# Patient Record
Sex: Female | Born: 1960 | Race: White | Hispanic: No | Marital: Married | Smoking: Never smoker
Health system: Southern US, Community
[De-identification: ages and names within clinical notes are randomized; demographics above are authoritative.]

## PROBLEM LIST (undated history)

## (undated) DIAGNOSIS — F329 Major depressive disorder, single episode, unspecified: Secondary | ICD-10-CM

## (undated) DIAGNOSIS — K635 Polyp of colon: Secondary | ICD-10-CM

## (undated) DIAGNOSIS — T8859XA Other complications of anesthesia, initial encounter: Secondary | ICD-10-CM

## (undated) DIAGNOSIS — E669 Obesity, unspecified: Secondary | ICD-10-CM

## (undated) DIAGNOSIS — M25569 Pain in unspecified knee: Secondary | ICD-10-CM

## (undated) DIAGNOSIS — Z8489 Family history of other specified conditions: Secondary | ICD-10-CM

## (undated) DIAGNOSIS — F32A Depression, unspecified: Secondary | ICD-10-CM

## (undated) DIAGNOSIS — K219 Gastro-esophageal reflux disease without esophagitis: Secondary | ICD-10-CM

## (undated) DIAGNOSIS — G4710 Hypersomnia, unspecified: Secondary | ICD-10-CM

## (undated) DIAGNOSIS — K589 Irritable bowel syndrome without diarrhea: Secondary | ICD-10-CM

## (undated) DIAGNOSIS — M1990 Unspecified osteoarthritis, unspecified site: Secondary | ICD-10-CM

## (undated) DIAGNOSIS — R600 Localized edema: Secondary | ICD-10-CM

## (undated) DIAGNOSIS — R112 Nausea with vomiting, unspecified: Secondary | ICD-10-CM

## (undated) DIAGNOSIS — R51 Headache: Secondary | ICD-10-CM

## (undated) DIAGNOSIS — M1710 Unilateral primary osteoarthritis, unspecified knee: Secondary | ICD-10-CM

## (undated) DIAGNOSIS — Z9889 Other specified postprocedural states: Secondary | ICD-10-CM

## (undated) DIAGNOSIS — T4145XA Adverse effect of unspecified anesthetic, initial encounter: Secondary | ICD-10-CM

## (undated) DIAGNOSIS — M549 Dorsalgia, unspecified: Secondary | ICD-10-CM

## (undated) DIAGNOSIS — D509 Iron deficiency anemia, unspecified: Secondary | ICD-10-CM

## (undated) DIAGNOSIS — I10 Essential (primary) hypertension: Secondary | ICD-10-CM

## (undated) DIAGNOSIS — R7302 Impaired glucose tolerance (oral): Secondary | ICD-10-CM

## (undated) DIAGNOSIS — E559 Vitamin D deficiency, unspecified: Secondary | ICD-10-CM

## (undated) DIAGNOSIS — M79673 Pain in unspecified foot: Secondary | ICD-10-CM

## (undated) DIAGNOSIS — F419 Anxiety disorder, unspecified: Secondary | ICD-10-CM

## (undated) DIAGNOSIS — Z780 Asymptomatic menopausal state: Secondary | ICD-10-CM

## (undated) DIAGNOSIS — R062 Wheezing: Secondary | ICD-10-CM

## (undated) DIAGNOSIS — K760 Fatty (change of) liver, not elsewhere classified: Secondary | ICD-10-CM

## (undated) DIAGNOSIS — E119 Type 2 diabetes mellitus without complications: Secondary | ICD-10-CM

## (undated) DIAGNOSIS — F411 Generalized anxiety disorder: Secondary | ICD-10-CM

## (undated) DIAGNOSIS — D649 Anemia, unspecified: Secondary | ICD-10-CM

## (undated) HISTORY — DX: Major depressive disorder, single episode, unspecified: F32.9

## (undated) HISTORY — DX: Anemia, unspecified: D64.9

## (undated) HISTORY — DX: Dorsalgia, unspecified: M54.9

## (undated) HISTORY — DX: Irritable bowel syndrome, unspecified: K58.9

## (undated) HISTORY — PX: APPENDECTOMY: SHX54

## (undated) HISTORY — PX: KNEE ARTHROSCOPY: SUR90

## (undated) HISTORY — DX: Hypersomnia, unspecified: G47.10

## (undated) HISTORY — DX: Fatty (change of) liver, not elsewhere classified: K76.0

## (undated) HISTORY — DX: Impaired glucose tolerance (oral): R73.02

## (undated) HISTORY — DX: Pain in unspecified foot: M79.673

## (undated) HISTORY — DX: Anxiety disorder, unspecified: F41.9

## (undated) HISTORY — DX: Generalized anxiety disorder: F41.1

## (undated) HISTORY — DX: Vitamin D deficiency, unspecified: E55.9

## (undated) HISTORY — DX: Irritable bowel syndrome without diarrhea: K58.9

## (undated) HISTORY — DX: Type 2 diabetes mellitus without complications: E11.9

## (undated) HISTORY — PX: ABDOMINAL HYSTERECTOMY: SHX81

## (undated) HISTORY — DX: Wheezing: R06.2

## (undated) HISTORY — PX: OOPHORECTOMY: SHX86

## (undated) HISTORY — DX: Unspecified osteoarthritis, unspecified site: M19.90

## (undated) HISTORY — DX: Polyp of colon: K63.5

## (undated) HISTORY — DX: Pain in unspecified knee: M25.569

## (undated) HISTORY — PX: CHOLECYSTECTOMY: SHX55

## (undated) HISTORY — DX: Headache: R51

## (undated) HISTORY — DX: Localized edema: R60.0

## (undated) HISTORY — DX: Unilateral primary osteoarthritis, unspecified knee: M17.10

## (undated) HISTORY — DX: Depression, unspecified: F32.A

## (undated) HISTORY — DX: Gastro-esophageal reflux disease without esophagitis: K21.9

## (undated) HISTORY — PX: OTHER SURGICAL HISTORY: SHX169

## (undated) HISTORY — DX: Obesity, unspecified: E66.9

## (undated) HISTORY — DX: Asymptomatic menopausal state: Z78.0

## (undated) HISTORY — DX: Iron deficiency anemia, unspecified: D50.9

---

## 1997-06-27 ENCOUNTER — Encounter: Admission: RE | Admit: 1997-06-27 | Discharge: 1997-09-25 | Payer: Self-pay | Admitting: Obstetrics and Gynecology

## 1999-10-20 ENCOUNTER — Encounter: Payer: Self-pay | Admitting: *Deleted

## 1999-10-20 ENCOUNTER — Emergency Department (HOSPITAL_COMMUNITY): Admission: EM | Admit: 1999-10-20 | Discharge: 1999-10-20 | Payer: Self-pay | Admitting: *Deleted

## 2000-07-10 ENCOUNTER — Other Ambulatory Visit: Admission: RE | Admit: 2000-07-10 | Discharge: 2000-07-10 | Payer: Self-pay | Admitting: Obstetrics and Gynecology

## 2001-12-21 ENCOUNTER — Other Ambulatory Visit: Admission: RE | Admit: 2001-12-21 | Discharge: 2001-12-21 | Payer: Self-pay | Admitting: Obstetrics and Gynecology

## 2002-01-07 ENCOUNTER — Encounter: Payer: Self-pay | Admitting: Urology

## 2002-01-22 ENCOUNTER — Inpatient Hospital Stay (HOSPITAL_COMMUNITY): Admission: RE | Admit: 2002-01-22 | Discharge: 2002-01-24 | Payer: Self-pay | Admitting: Urology

## 2003-07-05 ENCOUNTER — Other Ambulatory Visit: Admission: RE | Admit: 2003-07-05 | Discharge: 2003-07-05 | Payer: Self-pay | Admitting: Obstetrics and Gynecology

## 2004-01-19 ENCOUNTER — Encounter
Admission: RE | Admit: 2004-01-19 | Discharge: 2004-02-21 | Payer: Self-pay | Admitting: Physical Medicine & Rehabilitation

## 2004-02-21 ENCOUNTER — Encounter
Admission: RE | Admit: 2004-02-21 | Discharge: 2004-04-10 | Payer: Self-pay | Admitting: Physical Medicine & Rehabilitation

## 2004-03-29 ENCOUNTER — Ambulatory Visit: Payer: Self-pay | Admitting: Physical Medicine & Rehabilitation

## 2004-04-10 ENCOUNTER — Encounter
Admission: RE | Admit: 2004-04-10 | Discharge: 2004-07-09 | Payer: Self-pay | Admitting: Physical Medicine & Rehabilitation

## 2004-07-19 ENCOUNTER — Ambulatory Visit: Payer: Self-pay | Admitting: Internal Medicine

## 2004-07-25 ENCOUNTER — Ambulatory Visit: Payer: Self-pay | Admitting: Internal Medicine

## 2004-10-19 ENCOUNTER — Other Ambulatory Visit: Admission: RE | Admit: 2004-10-19 | Discharge: 2004-10-19 | Payer: Self-pay | Admitting: Obstetrics and Gynecology

## 2004-11-14 ENCOUNTER — Encounter: Admission: RE | Admit: 2004-11-14 | Discharge: 2004-11-14 | Payer: Self-pay | Admitting: Sports Medicine

## 2004-11-20 ENCOUNTER — Emergency Department (HOSPITAL_COMMUNITY): Admission: EM | Admit: 2004-11-20 | Discharge: 2004-11-20 | Payer: Self-pay | Admitting: Family Medicine

## 2006-01-24 ENCOUNTER — Encounter: Admission: RE | Admit: 2006-01-24 | Discharge: 2006-01-24 | Payer: Self-pay | Admitting: Sports Medicine

## 2006-02-07 ENCOUNTER — Encounter: Admission: RE | Admit: 2006-02-07 | Discharge: 2006-02-07 | Payer: Self-pay | Admitting: Sports Medicine

## 2006-03-21 ENCOUNTER — Encounter: Admission: RE | Admit: 2006-03-21 | Discharge: 2006-03-21 | Payer: Self-pay | Admitting: Sports Medicine

## 2006-04-18 ENCOUNTER — Encounter: Admission: RE | Admit: 2006-04-18 | Discharge: 2006-04-18 | Payer: Self-pay | Admitting: Sports Medicine

## 2006-08-13 ENCOUNTER — Encounter: Admission: RE | Admit: 2006-08-13 | Discharge: 2006-08-13 | Payer: Self-pay | Admitting: Sports Medicine

## 2006-09-19 ENCOUNTER — Encounter: Admission: RE | Admit: 2006-09-19 | Discharge: 2006-09-19 | Payer: Self-pay | Admitting: Sports Medicine

## 2006-10-10 ENCOUNTER — Encounter: Admission: RE | Admit: 2006-10-10 | Discharge: 2006-10-10 | Payer: Self-pay | Admitting: Sports Medicine

## 2006-11-28 ENCOUNTER — Encounter: Admission: RE | Admit: 2006-11-28 | Discharge: 2006-11-28 | Payer: Self-pay | Admitting: Sports Medicine

## 2006-12-07 ENCOUNTER — Emergency Department (HOSPITAL_COMMUNITY): Admission: EM | Admit: 2006-12-07 | Discharge: 2006-12-07 | Payer: Self-pay | Admitting: Family Medicine

## 2006-12-29 ENCOUNTER — Ambulatory Visit: Payer: Self-pay | Admitting: Internal Medicine

## 2006-12-29 LAB — CONVERTED CEMR LAB
ALT: 39 units/L — ABNORMAL HIGH (ref 0–35)
AST: 25 units/L (ref 0–37)
Alkaline Phosphatase: 49 units/L (ref 39–117)
BUN: 13 mg/dL (ref 6–23)
Basophils Relative: 1.1 % — ABNORMAL HIGH (ref 0.0–1.0)
CO2: 28 meq/L (ref 19–32)
Calcium: 9 mg/dL (ref 8.4–10.5)
Chloride: 116 meq/L — ABNORMAL HIGH (ref 96–112)
Crystals: NEGATIVE
Eosinophils Relative: 4.5 % (ref 0.0–5.0)
GFR calc Af Amer: 99 mL/min
HCT: 31.7 % — ABNORMAL LOW (ref 36.0–46.0)
Hemoglobin, Urine: NEGATIVE
Ketones, ur: NEGATIVE mg/dL
Mucus, UA: NEGATIVE
Neutrophils Relative %: 41.8 % — ABNORMAL LOW (ref 43.0–77.0)
Platelets: 164 10*3/uL (ref 150–400)
RBC / HPF: NONE SEEN
RBC: 3.6 M/uL — ABNORMAL LOW (ref 3.87–5.11)
Total CHOL/HDL Ratio: 4.9
Total Protein: 6.7 g/dL (ref 6.0–8.3)
Triglycerides: 114 mg/dL (ref 0–149)
VLDL: 23 mg/dL (ref 0–40)
WBC: 4.7 10*3/uL (ref 4.5–10.5)

## 2007-01-02 ENCOUNTER — Ambulatory Visit: Payer: Self-pay | Admitting: Internal Medicine

## 2007-01-03 DIAGNOSIS — F329 Major depressive disorder, single episode, unspecified: Secondary | ICD-10-CM

## 2007-01-03 DIAGNOSIS — R51 Headache: Secondary | ICD-10-CM

## 2007-01-03 DIAGNOSIS — D509 Iron deficiency anemia, unspecified: Secondary | ICD-10-CM

## 2007-01-03 DIAGNOSIS — F411 Generalized anxiety disorder: Secondary | ICD-10-CM | POA: Insufficient documentation

## 2007-01-03 DIAGNOSIS — F3289 Other specified depressive episodes: Secondary | ICD-10-CM

## 2007-01-03 DIAGNOSIS — R519 Headache, unspecified: Secondary | ICD-10-CM | POA: Insufficient documentation

## 2007-01-03 HISTORY — DX: Other specified depressive episodes: F32.89

## 2007-01-03 HISTORY — DX: Generalized anxiety disorder: F41.1

## 2007-01-03 HISTORY — DX: Iron deficiency anemia, unspecified: D50.9

## 2007-01-03 HISTORY — DX: Headache: R51

## 2007-01-03 HISTORY — DX: Major depressive disorder, single episode, unspecified: F32.9

## 2007-01-07 ENCOUNTER — Ambulatory Visit: Payer: Self-pay | Admitting: Internal Medicine

## 2007-01-07 LAB — CONVERTED CEMR LAB
Basophils Absolute: 0 10*3/uL (ref 0.0–0.1)
Eosinophils Absolute: 0.1 10*3/uL (ref 0.0–0.6)
HCT: 37.1 % (ref 36.0–46.0)
Hemoglobin: 12.7 g/dL (ref 12.0–15.0)
MCHC: 34.1 g/dL (ref 30.0–36.0)
MCV: 88.6 fL (ref 78.0–100.0)
Monocytes Absolute: 0.6 10*3/uL (ref 0.2–0.7)
Monocytes Relative: 6.8 % (ref 3.0–11.0)
Neutrophils Relative %: 57.4 % (ref 43.0–77.0)
RBC: 4.19 M/uL (ref 3.87–5.11)

## 2007-08-13 ENCOUNTER — Ambulatory Visit (HOSPITAL_BASED_OUTPATIENT_CLINIC_OR_DEPARTMENT_OTHER): Admission: RE | Admit: 2007-08-13 | Discharge: 2007-08-13 | Payer: Self-pay | Admitting: Orthopedic Surgery

## 2007-10-31 ENCOUNTER — Emergency Department (HOSPITAL_COMMUNITY): Admission: EM | Admit: 2007-10-31 | Discharge: 2007-10-31 | Payer: Self-pay | Admitting: Emergency Medicine

## 2008-12-26 LAB — HM MAMMOGRAPHY: HM Mammogram: NORMAL

## 2009-01-16 ENCOUNTER — Ambulatory Visit: Payer: Self-pay | Admitting: Internal Medicine

## 2009-01-16 DIAGNOSIS — M1990 Unspecified osteoarthritis, unspecified site: Secondary | ICD-10-CM | POA: Insufficient documentation

## 2009-01-16 DIAGNOSIS — K589 Irritable bowel syndrome without diarrhea: Secondary | ICD-10-CM

## 2009-01-16 DIAGNOSIS — K219 Gastro-esophageal reflux disease without esophagitis: Secondary | ICD-10-CM

## 2009-01-16 DIAGNOSIS — M1710 Unilateral primary osteoarthritis, unspecified knee: Secondary | ICD-10-CM

## 2009-01-16 DIAGNOSIS — E739 Lactose intolerance, unspecified: Secondary | ICD-10-CM | POA: Insufficient documentation

## 2009-01-16 DIAGNOSIS — IMO0002 Reserved for concepts with insufficient information to code with codable children: Secondary | ICD-10-CM

## 2009-01-16 HISTORY — DX: Irritable bowel syndrome, unspecified: K58.9

## 2009-01-16 HISTORY — DX: Gastro-esophageal reflux disease without esophagitis: K21.9

## 2009-01-16 HISTORY — DX: Reserved for concepts with insufficient information to code with codable children: IMO0002

## 2009-02-13 ENCOUNTER — Telehealth: Payer: Self-pay | Admitting: Internal Medicine

## 2009-02-15 ENCOUNTER — Ambulatory Visit: Payer: Self-pay | Admitting: Internal Medicine

## 2009-02-15 DIAGNOSIS — R1013 Epigastric pain: Secondary | ICD-10-CM | POA: Insufficient documentation

## 2009-02-17 LAB — CONVERTED CEMR LAB
AST: 17 units/L (ref 0–37)
Albumin: 4.2 g/dL (ref 3.5–5.2)
Basophils Absolute: 0.1 10*3/uL (ref 0.0–0.1)
CO2: 31 meq/L (ref 19–32)
Cholesterol: 179 mg/dL (ref 0–200)
Eosinophils Absolute: 0.3 10*3/uL (ref 0.0–0.7)
Glucose, Bld: 96 mg/dL (ref 70–99)
HCT: 41.3 % (ref 36.0–46.0)
Hemoglobin, Urine: NEGATIVE
Hemoglobin: 14.2 g/dL (ref 12.0–15.0)
Hgb A1c MFr Bld: 5.3 % (ref 4.6–6.5)
LDL Cholesterol: 97 mg/dL (ref 0–99)
Lymphs Abs: 3 10*3/uL (ref 0.7–4.0)
MCHC: 34.3 g/dL (ref 30.0–36.0)
MCV: 87.5 fL (ref 78.0–100.0)
Neutro Abs: 4 10*3/uL (ref 1.4–7.7)
Potassium: 4.1 meq/L (ref 3.5–5.1)
RDW: 11.9 % (ref 11.5–14.6)
Sodium: 141 meq/L (ref 135–145)
TSH: 0.74 microintl units/mL (ref 0.35–5.50)
Total CHOL/HDL Ratio: 4
Urine Glucose: NEGATIVE mg/dL
Urobilinogen, UA: 0.2 (ref 0.0–1.0)

## 2009-07-04 ENCOUNTER — Encounter: Payer: Self-pay | Admitting: Internal Medicine

## 2009-11-24 ENCOUNTER — Encounter (INDEPENDENT_AMBULATORY_CARE_PROVIDER_SITE_OTHER): Payer: Self-pay | Admitting: *Deleted

## 2009-12-29 ENCOUNTER — Ambulatory Visit: Payer: Self-pay | Admitting: Gastroenterology

## 2009-12-29 ENCOUNTER — Encounter (INDEPENDENT_AMBULATORY_CARE_PROVIDER_SITE_OTHER): Payer: Self-pay | Admitting: *Deleted

## 2010-01-01 ENCOUNTER — Telehealth: Payer: Self-pay | Admitting: Gastroenterology

## 2010-02-22 ENCOUNTER — Telehealth: Payer: Self-pay | Admitting: Gastroenterology

## 2010-04-02 ENCOUNTER — Ambulatory Visit: Payer: Self-pay | Admitting: Internal Medicine

## 2010-04-02 LAB — CONVERTED CEMR LAB
ALT: 14 units/L (ref 0–35)
AST: 19 units/L (ref 0–37)
Albumin: 3.6 g/dL (ref 3.5–5.2)
Alkaline Phosphatase: 43 units/L (ref 39–117)
BUN: 11 mg/dL (ref 6–23)
Basophils Relative: 1 % (ref 0.0–3.0)
CO2: 28 meq/L (ref 19–32)
Chloride: 106 meq/L (ref 96–112)
Eosinophils Relative: 3.7 % (ref 0.0–5.0)
Glucose, Bld: 103 mg/dL — ABNORMAL HIGH (ref 70–99)
Hemoglobin, Urine: NEGATIVE
Leukocytes, UA: NEGATIVE
Lymphocytes Relative: 42.8 % (ref 12.0–46.0)
MCV: 88.6 fL (ref 78.0–100.0)
Magnesium: 2 mg/dL (ref 1.5–2.5)
Monocytes Relative: 6.2 % (ref 3.0–12.0)
Neutrophils Relative %: 46.3 % (ref 43.0–77.0)
Nitrite: NEGATIVE
Potassium: 4.8 meq/L (ref 3.5–5.1)
RBC: 4.25 M/uL (ref 3.87–5.11)
TSH: 1.07 microintl units/mL (ref 0.35–5.50)
Total CHOL/HDL Ratio: 4
Total Protein, Urine: NEGATIVE mg/dL
WBC: 5.9 10*3/uL (ref 4.5–10.5)
pH: 6 (ref 5.0–8.0)

## 2010-04-04 ENCOUNTER — Encounter: Payer: Self-pay | Admitting: Internal Medicine

## 2010-04-04 ENCOUNTER — Ambulatory Visit: Payer: Self-pay | Admitting: Internal Medicine

## 2010-04-04 DIAGNOSIS — G4710 Hypersomnia, unspecified: Secondary | ICD-10-CM | POA: Insufficient documentation

## 2010-04-04 HISTORY — DX: Hypersomnia, unspecified: G47.10

## 2010-04-20 ENCOUNTER — Encounter (INDEPENDENT_AMBULATORY_CARE_PROVIDER_SITE_OTHER): Payer: Self-pay | Admitting: *Deleted

## 2010-05-03 ENCOUNTER — Ambulatory Visit: Payer: Self-pay | Admitting: Gastroenterology

## 2010-05-03 ENCOUNTER — Ambulatory Visit (HOSPITAL_COMMUNITY): Admission: RE | Admit: 2010-05-03 | Discharge: 2010-05-03 | Payer: Self-pay | Admitting: Gastroenterology

## 2010-05-03 LAB — HM COLONOSCOPY

## 2010-05-07 ENCOUNTER — Encounter: Payer: Self-pay | Admitting: Gastroenterology

## 2010-05-30 ENCOUNTER — Ambulatory Visit: Payer: Self-pay | Admitting: Psychology

## 2010-06-13 ENCOUNTER — Ambulatory Visit: Payer: Self-pay | Admitting: Psychology

## 2010-06-26 ENCOUNTER — Ambulatory Visit
Admission: RE | Admit: 2010-06-26 | Discharge: 2010-06-26 | Payer: Self-pay | Source: Home / Self Care | Attending: Psychology | Admitting: Psychology

## 2010-07-02 ENCOUNTER — Ambulatory Visit
Admission: RE | Admit: 2010-07-02 | Discharge: 2010-07-02 | Payer: Self-pay | Source: Home / Self Care | Attending: Internal Medicine | Admitting: Internal Medicine

## 2010-07-02 DIAGNOSIS — R062 Wheezing: Secondary | ICD-10-CM

## 2010-07-02 HISTORY — DX: Wheezing: R06.2

## 2010-07-03 ENCOUNTER — Ambulatory Visit: Admit: 2010-07-03 | Payer: Self-pay | Admitting: Psychology

## 2010-07-04 ENCOUNTER — Telehealth: Payer: Self-pay | Admitting: Internal Medicine

## 2010-07-06 ENCOUNTER — Ambulatory Visit
Admission: RE | Admit: 2010-07-06 | Discharge: 2010-07-06 | Payer: Self-pay | Source: Home / Self Care | Attending: Gastroenterology | Admitting: Gastroenterology

## 2010-07-10 ENCOUNTER — Encounter
Admission: RE | Admit: 2010-07-10 | Discharge: 2010-07-10 | Payer: Self-pay | Source: Home / Self Care | Attending: Interventional Radiology | Admitting: Interventional Radiology

## 2010-07-26 NOTE — Procedures (Signed)
Summary: Instructions for procedure/[place]  Instructions for procedure/[place]   Imported By: Sherian Rein 01/18/2010 08:50:14  _____________________________________________________________________  External Attachment:    Type:   Image     Comment:   External Document

## 2010-07-26 NOTE — Progress Notes (Signed)
Summary: Reschedule Procedure  Phone Note Call from Patient Call back at [phone]   Caller: Patient Call For: Dr. Christella Hartigan Reason for Call: Talk to Nurse Summary of Call: pt. needs to r/s her Endo/Colon...her son is having surgery and would like it r/s in Sept. Initial call taken by: Karna Christmas,  January 01, 2010 8:13 AM  Follow-up for Phone Call        rescheduled for 03/01/10  pt aware and instructions were reviewed for the new date Follow-up by: Chales Abrahams CMA Duncan Dull),  January 01, 2010 8:42 AM

## 2010-07-26 NOTE — Assessment & Plan Note (Signed)
History of Present Illness Visit Type: Initial Consult Primary GI MD: Rob Bunting MD Primary Provider: Oliver Barre, MD Chief Complaint: 1 year nausea and epigastric abdominal discomfort. History of Present Illness:     Stacey 50 year old [name] who is here with her daughter today. who is nauseas most morning, can vomit at times.  This has been a problem for about a year.  She takes Scientist, research (medical). She gets pyrosis, acid taste in throat for many years.  She has been on omeprazole, she takes one pill in AM after breakfast and one pill after dinner.  This will help the pyrosis.  When she stops then the nausea returns.  She has choking sensation at time.  Sometimes large bolus of food will cause dysphagia.    Overall her weight is up/down overall stable in the past year.  Very rare NSAIDs.  She's had IBS like alternating constipating/loose stools.  She had an EGD/colonoscopy by Bu[facility]ini many years ago.           Current Medications (verified): 1)  Estradiol 2 Mg Tabs (Estradiol) .... Take 1 Tablet By Mouth Once A Day 2)  Omeprazole 20 Mg Cpdr (Omeprazole) .... 2 By Mouth Once Daily 3)  Promethazine Hcl 25 Mg Tabs (Promethazine Hcl) .Marland Kitchen.. 1 By Mouth  Q 6 Hrs As Needed  Allergies: 1)  ! Tylox 2)  ! Morphine 3)  ! Codeine 4)  ! Penicillin 5)  ! Amoxicillin  Past History:  Past Medical History: Anemia-iron deficiency Anxiety Depression Obesity Headache- Migraine right knee severe DJD IBS (had flex sig in her 20's) Early menopause due to endometriosis GERD ? OAB - declines med trial    Past Surgical History: Appendectomy- 1992 Cholecystectomy- 1991 GU surgery- 12/2001/bladder tack Hysterectomy- 1992 s/p bilat knee arthroscopic surgury Oophorectomy - bilat at 50 yo    Family History: son with epilepsy father died with spinal meningitis at 10yo mother with HTN   Social History: Married 2 children Homemaker Never Smoked Alcohol use-no   Review of Systems   Pertinent positive and negative review of systems were noted in the above HPI and GI specific review of systems.  All other review of systems was otherwise negative.   Vital Signs:  Patient profile:   50 year old female Height:      66 inches Weight:      238.13 pounds BMI:     38.57 BSA:     2.16 Pulse rate:   80 / minute Pulse rhythm:   regular BP sitting:   138 / 88  (left arm)  Vitals Entered By: Lamona Curl CMA Duncan Dull) (December 29, 2009 3:24 PM)  Physical Exam  Additional Exam:  Constitutional: Obese, otherwise generally well appearing Psychiatric: alert and oriented times 3 Eyes: extraocular movements intact Mouth: oropharynx moist, no lesions Neck: supple, no lymphadenopathy Cardiovascular: heart regular rate and rythm Lungs: CTA bilaterally Abdomen: soft, non-tender, non-distended, no obvious ascites, no peritoneal signs, normal bowel sounds Extremities: no lower extremity edema bilaterally Skin: no lesions on visible extremities    Impression & Recommendations:  Problem # 1:  chronic GERD, routine risk for colon cancer she has had GERD-like symptoms for many many years. Proton pump inhibitors help when she takes them. She does not take them at the correct time in relation to food and so I have recommended that she change that. She did not have any alarm symptoms but we should proceed with EGD given the chronicity of her GERD symptoms. We'll check her  for Barrett's, other complications. At the same time will proceed with a screening colonoscopy. She is very nervous about all of this and I think it is best that we do this at the hospital with propofol sedation. She is also telling me that she knows that she'll be nauseous and will be unable to tolerate her colonoscopy prep and so I have given her a prescription for Zofran, 2 pills.  Patient Instructions: 1)  You should change the way you are taking your antiacid medicine (omeprazole) so that you are taking it 20-30  minutes prior to a decent meal as that is the way the pill is designed to work most effectively. 2)  You will be scheduled to have an upper endoscopy [facility] with propofol sedation). 3)  You will be scheduled to have a colonoscopy. 4)  Zofran 4mg  pills (two) given as prescription, take one pill before you start the colonosocpy prep and one 4-6 hours later if needed. 5)  A copy of this information will be sent to Dr. Jonny Ruiz. 6)  The medication list was reviewed and reconciled.  All changed / newly prescribed medications were explained.  A complete medication list was provided to the patient / caregiver. Prescriptions: ZOFRAN 4 MG  TABS (ONDANSETRON HCL) take one pill as needed for nausea every 5-6 hours  #2 x 0   Entered and Authorized by:   Rachael Fee MD   Signed by:   Rachael Fee MD on 12/29/2009   Method used:   [facility] [address] [phone]* (retail)       [address]       [place], [place]  [address]       Ph: [phone] or [phone]       Fax: [phone]   RxID:   [id number]   Appended Document: Orders Update/Movi    Clinical Lists Changes  Problems: Added new problem of SPECIAL SCREENING FOR MALIGNANT NEOPLASMS COLON (ICD-V76.51) Medications: Added new medication of MOVIPREP 100 GM  SOLR (PEG-KCL-NACL-NASULF-NA ASC-C) As per prep instructions. - Signed Rx of MOVIPREP 100 GM  SOLR (PEG-KCL-NACL-NASULF-NA ASC-C) As per prep instructions.;  #1 x 0;  Signed;  Entered by: Chales Abrahams CMA (AAMA);  Authorized by: Rachael Fee MD;  Method used: [facility] [address] [phone]*, [address] [place], [place], [place]  [address], Ph: [phone] or [phone], Fax: [phone] Orders: Added new Test order of [facility] (Col/End [place]) - Signed    Prescriptions: MOVIPREP 100 GM  SOLR (PEG-KCL-NACL-NASULF-NA ASC-C) As per prep instructions.  #1 x 0   Entered by:   Chales Abrahams CMA (AAMA)   Authorized by:   Rachael Fee MD   Signed by:   Chales Abrahams CMA (AAMA) on 12/29/2009   Method used:   [facility]        [facility] [address] [phone]* (retail)       [address]       [place], [place]  [address]       Ph: [phone] or [phone]       Fax: [phone]   RxID:   [id number]

## 2010-07-26 NOTE — Procedures (Signed)
Summary: EGD/[place]  EGD/[place]   Imported By: Sherian Rein 07/11/2010 08:41:43  _____________________________________________________________________  External Attachment:    Type:   Image     Comment:   External Document

## 2010-07-26 NOTE — Letter (Signed)
Summary: [facility] Instructions  [facility]  [address] [place], [place] [address]   Phone: [phone]  Fax: [phone]       DINORA HEMM    1960/12/08    MRN: [id number]        Procedure Day /Date:05/03/10  THURS     Arrival Time:9 am       Procedure Time:11 am     Location of Procedure:                     X  [facility] ( Outpatient Registration)                        PREPARATION FOR COLONOSCOPY WITH MOVIPREP   Starting 5 days prior to your procedure 04/28/10 do not eat nuts, seeds, popcorn, corn, beans, peas,  salads, or any raw vegetables.  Do not take any fiber supplements (e.g. Metamucil, Citrucel, and Benefiber).  THE DAY BEFORE YOUR PROCEDURE         DATE: 05/02/10  DAY: WED  1.  Drink clear liquids the entire day-NO SOLID FOOD  2.  Do not drink anything colored red or purple.  Avoid juices with pulp.  No orange juice.  3.  Drink at least 64 oz. (8 glasses) of fluid/clear liquids during the day to prevent dehydration and help the prep work efficiently.  CLEAR LIQUIDS INCLUDE: Water Jello Ice Popsicles Tea (sugar ok, no milk/cream) Powdered fruit flavored drinks Coffee (sugar ok, no milk/cream) Gatorade Juice: apple, white grape, white cranberry  Lemonade Clear bullion, consomm, broth Carbonated beverages (any kind) Strained chicken noodle soup Hard Candy                             4.  In the morning, mix first dose of MoviPrep solution:    Empty 1 Pouch A and 1 Pouch B into the disposable container    Add lukewarm drinking water to the top line of the container. Mix to dissolve    Refrigerate (mixed solution should be used within 24 hrs)  5.  Begin drinking the prep at 5:00 p.m. The MoviPrep container is divided by 4 marks.   Every 15 minutes drink the solution down to the next mark (approximately 8 oz) until the full liter is complete.   6.  Follow completed prep with 16 oz of clear liquid of your choice (Nothing  red or purple).  Continue to drink clear liquids until bedtime.  7.  Before going to bed, mix second dose of MoviPrep solution:    Empty 1 Pouch A and 1 Pouch B into the disposable container    Add lukewarm drinking water to the top line of the container. Mix to dissolve    Refrigerate  THE DAY OF YOUR PROCEDURE      DATE: 05/03/10 DAY: THURS  Beginning at 6 a.m. (5 hours before procedure):         1. Every 15 minutes, drink the solution down to the next mark (approx 8 oz) until the full liter is complete.  2. Follow completed prep with 16 oz. of clear liquid of your choice.     Nothing to eat or drink after midnight  MEDICATION INSTRUCTIONS  Unless otherwise instructed, you should take regular prescription medications with a small sip of water   as early as possible the morning of your procedure.  OTHER INSTRUCTIONS  You will need a responsible adult at least 51 years of age to accompany you and drive you home.   This person must remain in the waiting room during your procedure.  Wear loose fitting clothing that is easily removed.  Leave jewelry and other valuables at home.  However, you may wish to bring a book to read or  an iPod/MP3 player to listen to music as you wait for your procedure to start.  Remove all body piercing jewelry and leave at home.  Total time from sign-in until discharge is approximately 2-3 hours.  You should go home directly after your procedure and rest.  You can resume normal activities the  day after your procedure.  The day of your procedure you should not:   Drive   Make legal decisions   Operate machinery   Drink alcohol   Return to work  You will receive specific instructions about eating, activities and medications before you leave.    The above instructions have been reviewed and explained to me by   _______________________    I fully understand and can verbalize these instructions  _____________________________ Date _________

## 2010-07-26 NOTE — Letter (Signed)
Summary: Vomiting & diarrhea/Call-C-Nurse  Vomiting & diarrhea/[facility]   Imported By: Lester Laie 07/06/2009 12:19:27  _____________________________________________________________________  External Attachment:    Type:   Image     Comment:   External Document

## 2010-07-26 NOTE — Assessment & Plan Note (Signed)
Review of gastrointestinal problems: 1. Hyperplastic colon polyp, November 2011.  Next colonoscopy at 10 year interval    History of Present Illness Visit Type: Follow-up Visit Primary GI MD: Rob Bunting MD Primary Provider: Oliver Barre, MD Chief Complaint: abdominal pain History of Present Illness:     very pleasant 50 year old woman whom I last saw about 2 months ago at the time of upper and lower endoscopies.  she started to have welts on neck, arms, legs..this was in end of november. only meds she was on was estrogen and omeprazole at that time.  stopped the omeprazole and welts went away.  She occasionally has nausea, intermittently.  About 2 times a week. Can last 1-2 hours.  Will lay down.  (had cholecystecomy 20 years ago).  When she on PPI, the symptoms didn't happen nearly at all.    She eats a lot oranges.             Current Medications (verified): 1)  Azithromycin 250 Mg Tabs (Azithromycin) .... 2po Qd For 1 Day, Then 1po Qd For 4days, Then Stop 2)  Estradiol 2 Mg Tabs (Estradiol) .Marland Kitchen.. 1 By Mouth Once Daily 3)  Prednisone 10 Mg Tabs (Prednisone) .... 3po Qd For 3days, Then 2po Qd For 3days, Then 1po Qd For 3days, Then Stop 4)  Tessalon Perles 100 Mg Caps (Benzonatate) .Marland Kitchen.. 1po Three Times A Day As Needed  Allergies: 1)  ! Tylox 2)  ! Morphine 3)  ! Codeine 4)  ! Penicillin 5)  ! Amoxicillin 6)  ! Hydrocodone 7)  ! * Omeprazole  Vital Signs:  Patient profile:   50 year old female Height:      66 inches Weight:      235.38 pounds BMI:     38.13 Pulse rate:   68 / minute Pulse rhythm:   regular BP sitting:   120 / 72  (left arm) Cuff size:   large  Vitals Entered By: June McMurray CMA Duncan Dull) (July 06, 2010 9:13 AM)  Physical Exam  Additional Exam:  Constitutional: generally well appearing Psychiatric: alert and oriented times 3 Abdomen: soft, non-tender, non-distended, normal bowel sounds    Impression & Recommendations:  Problem # 1:   dyspepsia, GERD related likely she has head a potential reaction to omeprazole. She will try H2 blocker nightly before she goes to bed and will call my office to report on her symptoms in 4-5 weeks.  Patient Instructions: 1)  Try H2 blocker (zantac/pepcid) at bedtime every night for 1 months, then call Dr. Christella Hartigan' office to report on your symptoms. 2)  Prescription for zofran given, take as needed for nausea. 3)  A copy of this information will be sent to Dr. Jonny Ruiz. 4)  The medication list was reviewed and reconciled.  All changed / newly prescribed medications were explained.  A complete medication list was provided to the patient / caregiver. Prescriptions: ZOFRAN 4 MG  TABS (ONDANSETRON HCL) take one pill every 12 hours as needed for nausea  #50 x 1   Entered and Authorized by:   Rachael Fee MD   Signed by:   Rachael Fee MD on 07/06/2010   Method used:   [facility]        [facility] [address] [phone]* (retail)       [address]       [place], [place]  [address]       Ph: [phone]  or [phone]       Fax: [phone]   RxID:   [id number]

## 2010-07-26 NOTE — Procedures (Signed)
Summary: Colonoscopy  Patient: [name] Note: All result statuses are Final unless otherwise noted.  Tests: (1) Colonoscopy (COL)   COL Colonoscopy           DONE     [facility]     [address] [place], [place]  [address]           COLONOSCOPY PROCEDURE REPORT           PATIENT:  [name], [name]  MR#:  [id number]     BIRTHDATE:  [birth year], 49 yrs. old  GENDER:  female     ENDOSCOPIST:  Rachael Fee, MD     REF. BY:  Oliver Barre, M.D.     PROCEDURE DATE:  05/03/2010     PROCEDURE:  Colonoscopy with snare polypectomy     ASA CLASS:  Class II     INDICATIONS:  Routine Risk Screening     MEDICATIONS:   MAC sedation, administered by CRNA           DESCRIPTION OF PROCEDURE:   After the risks benefits and     alternatives of the procedure were thoroughly explained, informed     consent was obtained.  Digital rectal exam was performed and     revealed no rectal masses.   The [id number] ([id number]) endoscope was     introduced through the anus and advanced to the cecum, which was     identified by both the appendix and ileocecal valve, without     limitations.  The quality of the prep was good, using MoviPrep.     The instrument was then slowly withdrawn as the colon was fully     examined.     <<PROCEDUREIMAGES>>     FINDINGS:  A diminutive polyp was found in the rectum (see     image003).  This was otherwise a normal examination of the colon     (see image001, image002, and image004).   Retroflexed views in the     rectum revealed no abnormalities.    The scope was then withdrawn     from the patient and the procedure completed.     COMPLICATIONS:  None           ENDOSCOPIC IMPRESSION:     1) Diminutive polyp in the rectum, removed and sent to pathology           2) Otherwise normal examination           RECOMMENDATIONS:     1) If the polyp(s) removed today are proven to be adenomatous     (pre-cancerous) polyps, you will need a repeat colonoscopy in  5     years. Otherwise you should continue to follow colorectal cancer     screening guidelines for "routine risk" patients with colonoscopy     in 10 years.     2) You will receive a letter within 1-2 weeks with the results     of your biopsy as well as final recommendations. Please call my     office if you have not received a letter after 3 weeks.           ______________________________     Rachael Fee, MD           n.     eSIGNED:   Rachael Fee at 05/03/2010 11:12 AM           Alycia Patten, [id number]  Note: An exclamation  mark (!) indicates a result that was not dispersed into the flowsheet. Document Creation Date: 05/03/2010 11:14 AM _______________________________________________________________________  (1) Order result status: Final Collection or observation date-time: 05/03/2010 11:02 Requested date-time:  Receipt date-time:  Reported date-time:  Referring Physician:   Ordering Physician: Rob Bunting [phone]) Specimen Source:  Source: Launa Grill Order Number: [id number] Lab site:

## 2010-07-26 NOTE — Letter (Signed)
Summary: Results Letter  [facility]  [address] [place], [place] [address]   Phone: [phone]  Fax: [phone]        May 07, 2010 MRN: [id number]    [name] [address] [place], [place]  [address]    Dear Ms. Bouska,   Good news.  The polyp(s) that were removed during your recent procedure were NOT pre-cancerous.  You should continue to follow current colorectal cancer screening guidelines with a repeat colonoscopy in 10 years.  We will therefore put your information in our reminder system and will contact you in 10 years to schedule a repeat procedure.  Please call if you have any questions or concerns.       Sincerely,  Rachael Fee MD  This letter has been electronically signed by your physician.  Appended Document: Results Letter letter mailed

## 2010-07-26 NOTE — Assessment & Plan Note (Signed)
Summary: cough/chest pressure/congestion/headaches/lb   Vital Signs:  Patient profile:   50 year old female Height:      66 inches Weight:      236.13 pounds BMI:     38.25 O2 Sat:      95 % on Room air Temp:     97.4 degrees F oral Pulse rate:   89 / minute BP sitting:   130 / 72  (left arm) Cuff size:   small  Vitals Entered By: Zella Ball Ewing CMA Duncan Dull) (July 02, 2010 2:31 PM)  O2 Flow:  Room air CC: Cough, Chest tightness, headaches/RE   Primary Care Provider:  Oliver Barre, MD  CC:  Cough, Chest tightness, and headaches/RE.  History of Present Illness: here for acute and f/u - overall doing ok, until 3 days with fever, mild ST, and now worsening prod cough greenish sputum and mild wheezing/sob; Pt denies CP, worsening sob, doe, wheezing, orthopnea, pnd, worsening LE edema, palps, dizziness or syncope  Pt denies new neuro symptoms such as headache, facial or extremity weakness  Pt denies polydipsia, polyuria  Overall good compliance with meds, trying to follow low chol diet, wt stable, little excercise however Denies worsening depressive symptoms, suicidal ideation, or panic.  , though still has ongoing anxiety, though not worse recently adn manageable on no current meds.  No recent wt loss, night sweats, loss of appetite or other constitutional symptoms   Problems Prior to Update: 1)  Wheezing  (ICD-786.07) 2)  Bronchitis-acute  (ICD-466.0) 3)  Hypersomnia  (ICD-780.54) 4)  Special Screening For Malignant Neoplasms Colon  (ICD-V76.51) 5)  Abdominal Pain, Epigastric  (ICD-789.06) 6)  Glucose Intolerance  (ICD-271.3) 7)  Gerd  (ICD-530.81) 8)  Preventive Health Care  (ICD-V70.0) 9)  Ibs  (ICD-564.1) 10)  Osteoarthritis, Knees, Bilateral  (ICD-715.96) 11)  Headache  (ICD-784.0) 12)  Depression  (ICD-311) 13)  Anxiety  (ICD-300.00) 14)  Anemia-iron Deficiency  (ICD-280.9)  Medications Prior to Update: 1)  Omeprazole 20 Mg Cpdr (Omeprazole) .... 2 By Mouth Once Daily 2)   Estradiol 2 Mg Tabs (Estradiol) .Marland Kitchen.. 1 By Mouth Once Daily  Current Medications (verified): 1)  Azithromycin 250 Mg Tabs (Azithromycin) .... 2po Qd For 1 Day, Then 1po Qd For 4days, Then Stop 2)  Estradiol 2 Mg Tabs (Estradiol) .Marland Kitchen.. 1 By Mouth Once Daily 3)  Prednisone 10 Mg Tabs (Prednisone) .... 3po Qd For 3days, Then 2po Qd For 3days, Then 1po Qd For 3days, Then Stop 4)  Tussionex Pennkinetic Er 10-8 Mg/25ml Lqcr (Hydrocod Polst-Chlorphen Polst) .Marland Kitchen.. 1 Tsp By Mouth Two Times A Day As Needed  Allergies (verified): 1)  ! Tylox 2)  ! Morphine 3)  ! Codeine 4)  ! Penicillin 5)  ! Amoxicillin  Past History:  Past Medical History: Last updated: 12/29/2009 Anemia-iron deficiency Anxiety Depression Obesity Headache- Migraine right knee severe DJD IBS (had flex sig in her 20's) Early menopause due to endometriosis GERD ? OAB - declines med trial    Past Surgical History: Last updated: 12/29/2009 Appendectomy- 1992 Cholecystectomy- 1991 GU surgery- 12/2001/bladder tack Hysterectomy- 1992 s/p bilat knee arthroscopic surgury Oophorectomy - bilat at 50 yo    Social History: Last updated: 04/04/2010 Married 2 children Homemaker Never Smoked Alcohol use-no  Drug use-no  Risk Factors: Smoking Status: never (01/16/2009)  Review of Systems       all otherwise negative per pt -    Physical Exam  General:  alert and overweight-appearing., mild ill  Head:  normocephalic  and atraumatic.   Eyes:  vision grossly intact, pupils equal, and pupils round.   Ears:  bialt tms' red, sinus nontender, canals clear Nose:  nasal dischargemucosal pallor and mucosal edema.   Mouth:  pharyngeal erythema and fair dentition.   Neck:  supple and cervical lymphadenopathy.   Lungs:  normal respiratory effort, R decreased breath sounds, R wheezes, L decreased breath sounds, and L wheezes.   Heart:  normal rate and regular rhythm.   Extremities:  no edema, no erythema  Skin:  color normal and  no rashes.   Psych:  not depressed appearing and slightly anxious.     Impression & Recommendations:  Problem # 1:  BRONCHITIS-ACUTE (ICD-466.0)  Her updated medication list for this problem includes:    Azithromycin 250 Mg Tabs (Azithromycin) .Marland Kitchen... 2po qd for 1 day, then 1po qd for 4days, then stop    Tussionex Pennkinetic Er 10-8 Mg/5ml Lqcr (Hydrocod polst-chlorphen polst) .Marland Kitchen... 1 tsp by mouth two times a day as needed treat as above, f/u any worsening signs or symptoms   Problem # 2:  WHEEZING (ICD-786.07)  c/w bronchospasm likely related to above- for depomedrol IM , and predpack for home, f/u any worsening symtpoms  Orders: Depo- Medrol 40mg  (J1030) Depo- Medrol 80mg  (J1040) Admin of Therapeutic Inj  intramuscular or subcutaneous ([address])  Problem # 3:  GLUCOSE INTOLERANCE (ICD-271.3) asympt - pt to call for any onset polys with steroid tx, or cbg > 150  Problem # 4:  ANXIETY (ICD-300.00) stable overall by hx and exam, ok to continue meds/tx as is  - no need for further meds at this time   Complete Medication List: 1)  Azithromycin 250 Mg Tabs (Azithromycin) .... 2po qd for 1 day, then 1po qd for 4days, then stop 2)  Estradiol 2 Mg Tabs (Estradiol) .Marland Kitchen.. 1 by mouth once daily 3)  Prednisone 10 Mg Tabs (Prednisone) .... 3po qd for 3days, then 2po qd for 3days, then 1po qd for 3days, then stop 4)  Tussionex Pennkinetic Er 10-8 Mg/59ml Lqcr (Hydrocod polst-chlorphen polst) .Marland Kitchen.. 1 tsp by mouth two times a day as needed  Patient Instructions: 1)  you had the steroid shot today 2)  Please take all new medications as prescribed 3)  Continue all previous medications as before this visit  4)  stop the omeprazole as you have 5)  Do not take further if you have rash from the omeprazole as this may be an allergic reaction 6)  Please keep your appts with Dr Christella Hartigan, as well as Pulmonary to see about the daytime sleepiness 7)  Please schedule a follow-up appointment in October 2012 for  CPX with labs, or sooner if needed Prescriptions: TUSSIONEX PENNKINETIC ER 10-8 MG/5ML LQCR (HYDROCOD POLST-CHLORPHEN POLST) 1 tsp by mouth two times a day as needed  #6oz x 1   Entered and Authorized by:   Corwin Levins MD   Signed by:   Corwin Levins MD on 07/02/2010   Method used:   Print then Give to Patient   RxID:   [id number] PREDNISONE 10 MG TABS (PREDNISONE) 3po qd for 3days, then 2po qd for 3days, then 1po qd for 3days, then stop  #18 x 0   Entered and Authorized by:   Corwin Levins MD   Signed by:   Corwin Levins MD on 07/02/2010   Method used:   Print then Give to Patient   RxID:   [id number] AZITHROMYCIN 250 MG TABS (AZITHROMYCIN) 2po qd  for 1 day, then 1po qd for 4days, then stop  #6 x 1   Entered and Authorized by:   Corwin Levins MD   Signed by:   Corwin Levins MD on 07/02/2010   Method used:   Print then Give to Patient   RxID:   [id number]    Medication Administration  Injection # 1:    Medication: Depo- Medrol 40mg     Diagnosis: WHEEZING (ICD-786.07)    Route: IM    Site: LUOQ gluteus    Exp Date: 12/2012    Lot #: [id number]    Mfr: Pharmacia    Comments: Patient received 120mg  Depo-medrol    Patient tolerated injection without complications    Given by: Zella Ball Ewing CMA Duncan Dull) (July 02, 2010 3:25 PM)  Injection # 2:    Medication: Depo- Medrol 80mg     Diagnosis: WHEEZING (ICD-786.07)    Route: IM    Site: LUOQ gluteus    Exp Date: 12/2012    Lot #: [id number]    Mfr: Pharmacia    Given by: Zella Ball Ewing CMA Duncan Dull) (July 02, 2010 3:25 PM)  Orders Added: 1)  Depo- Medrol 40mg  [J1030] 2)  Depo- Medrol 80mg  [J1040] 3)  Admin of Therapeutic Inj  intramuscular or subcutaneous [96372] 4)  Est. Patient Level IV [30865]

## 2010-07-26 NOTE — Letter (Signed)
Summary: New Patient letter  [facility]  [address] [place], [place] [address]   Phone: [phone]  Fax: [phone]       11/24/2009 MRN: [id number]  [name] [address] [place], [place]  [address]  Dear [name],  Welcome to the Gastroenterology Division at [facility].    You are scheduled to see Dr.  Rob Bunting on December 29, 2009 at 3:30pm on the 3rd floor at [facility], [address]  We ask that you try to arrive at our office 15 minutes prior to your appointment time to allow for check-in.  We would like you to complete the enclosed self-administered evaluation form prior to your visit and bring it with you on the day of your appointment.  We will review it with you.  Also, please bring a complete list of all your medications or, if you prefer, bring the medication bottles and we will list them.  Please bring your insurance card so that we may make a copy of it.  If your insurance requires a referral to see a specialist, please bring your referral form from your primary care physician.  Co-payments are due at the time of your visit and may be paid by cash, check or credit card.     Your office visit will consist of a consult with your physician (includes a physical exam), any laboratory testing he/she may order, scheduling of any necessary diagnostic testing (e.g. x-ray, ultrasound, CT-scan), and scheduling of a procedure (e.g. Endoscopy, Colonoscopy) if required.  Please allow enough time on your schedule to allow for any/all of these possibilities.    If you cannot keep your appointment, please call [phone] to cancel or reschedule prior to your appointment date.  This allows [place] the opportunity to schedule an appointment for another patient in need of care.  If you do not cancel or reschedule by 5 p.m. the business day prior to your appointment date, you will be charged a $50.00 late cancellation/no-show fee.    Thank you for  choosing [facility] for your medical needs.  We appreciate the opportunity to care for you.  Please visit [place] at our website  to learn more about our practice.                     Sincerely,                                                             The Gastroenterology Division

## 2010-07-26 NOTE — Progress Notes (Signed)
Summary: Reschedule Procedure  Phone Note Call from Patient Call back at Home Phone [phone] Call back at [phone]   Caller: Patient Call For: Dr. Christella Hartigan Reason for Call: Talk to Nurse Summary of Call: Needs to r/s her Endo/COL for November Initial call taken by: Karna Christmas,  February 22, 2010 1:39 PM  Follow-up for Phone Call        spoke with the pt advised her the schedule is still not out for november and I will call her as soon as it is and reschedule her procedures.  I also spoke with Aurea Graff and she advises that the schedule is being reviewed by Dr Russella Dar Follow-up by: Chales Abrahams CMA Duncan Dull),  February 23, 2010 9:24 AM  Additional Follow-up for Phone Call Additional follow up Details #1::        Nov sch out called to resch pt procedure left message on machine to call back  Chales Abrahams CMA Duncan Dull)  February 27, 2010 9:47 AM     Additional Follow-up for Phone Call Additional follow up Details #2::    schedule changed to 05/03/10.  pt aware Follow-up by: Chales Abrahams CMA Duncan Dull),  February 27, 2010 2:46 PM

## 2010-07-26 NOTE — Letter (Signed)
Summary: [facility] Instructions  [facility]  [address] [place], [place] [address]   Phone: [phone]  Fax: [phone]       [name]    06/13/1961    MRN: [id number]        Procedure Day /Date:01/18/10 THURS     Arrival Time:9 am     Procedure Time:11 am     Location of Procedure:                     X  [facility] ( Outpatient Registration)                        PREPARATION FOR COLONOSCOPY WITH MOVIPREP   Starting 5 days prior to your procedure 01/13/10  do not eat nuts, seeds, popcorn, corn, beans, peas,  salads, or any raw vegetables.  Do not take any fiber supplements (e.g. Metamucil, Citrucel, and Benefiber).  THE DAY BEFORE YOUR PROCEDURE         DATE:01/17/10   DAY: WED  1.  Drink clear liquids the entire day-NO SOLID FOOD  2.  Do not drink anything colored red or purple.  Avoid juices with pulp.  No orange juice.  3.  Drink at least 64 oz. (8 glasses) of fluid/clear liquids during the day to prevent dehydration and help the prep work efficiently.  CLEAR LIQUIDS INCLUDE: Water Jello Ice Popsicles Tea (sugar ok, no milk/cream) Powdered fruit flavored drinks Coffee (sugar ok, no milk/cream) Gatorade Juice: apple, white grape, white cranberry  Lemonade Clear bullion, consomm, broth Carbonated beverages (any kind) Strained chicken noodle soup Hard Candy                             4.  In the morning, mix first dose of MoviPrep solution:    Empty 1 Pouch A and 1 Pouch B into the disposable container    Add lukewarm drinking water to the top line of the container. Mix to dissolve    Refrigerate (mixed solution should be used within 24 hrs)  5.  Begin drinking the prep at 5:00 p.m. The MoviPrep container is divided by 4 marks.   Every 15 minutes drink the solution down to the next mark (approximately 8 oz) until the full liter is complete.   6.  Follow completed prep with 16 oz of clear liquid of your choice (Nothing  red or purple).  Continue to drink clear liquids until bedtime.  7.  Before going to bed, mix second dose of MoviPrep solution:    Empty 1 Pouch A and 1 Pouch B into the disposable container    Add lukewarm drinking water to the top line of the container. Mix to dissolve    Refrigerate  THE DAY OF YOUR PROCEDURE      DATE: 01/18/10  DAY: Magdalene Molly  Beginning at 6 a.m. (5 hours before procedure):         1. Every 15 minutes, drink the solution down to the next mark (approx 8 oz) until the full liter is complete.  2. Follow completed prep with 16 oz. of clear liquid of your choice.    3.Nothing to eat or drink after midnight except the colon prep.    MEDICATION INSTRUCTIONS  Unless otherwise instructed, you should take regular prescription medications with a small sip of water   as early as possible the morning of your  procedure.           OTHER INSTRUCTIONS  You will need a responsible adult at least 51 years of age to accompany you and drive you home.   This person must remain in the waiting room during your procedure.  Wear loose fitting clothing that is easily removed.  Leave jewelry and other valuables at home.  However, you may wish to bring a book to read or  an iPod/MP3 player to listen to music as you wait for your procedure to start.  Remove all body piercing jewelry and leave at home.  Total time from sign-in until discharge is approximately 2-3 hours.  You should go home directly after your procedure and rest.  You can resume normal activities the  day after your procedure.  The day of your procedure you should not:   Drive   Make legal decisions   Operate machinery   Drink alcohol   Return to work  You will receive specific instructions about eating, activities and medications before you leave.    The above instructions have been reviewed and explained to me by   _______________________    I fully understand and can verbalize these  instructions _____________________________ Date _________

## 2010-07-26 NOTE — Progress Notes (Signed)
  Phone Note Call from Patient Call back at Home Phone [phone]   Caller: Patient--[phone] Call For: Corwin Levins MD Summary of Call: Pt allergic to [facility] ER 10-8 MG/5ML LQCR (HYDROCOD POLST-CHLORPHEN POLST), pt requests a differant cough suppressant be called in to pharmacy, pt seen 1/9 Initial call taken by: Verdell Face,  July 04, 2010 11:02 AM  Follow-up for Phone Call        which part of tussionex, as we have allergy only to tylox, morphine and codeine  I think she can take the tussionex ok Follow-up by: Corwin Levins MD,  July 04, 2010 12:13 PM  Additional Follow-up for Phone Call Additional follow up Details #1::        left message on machine for pt to return my call. Margaret Pyle, CMA  July 04, 2010 1:58 PM   Pt called back stating she is allergic to Hydrocodone and is requesting alternate to [facility]  Additional Follow-up by: Margaret Pyle, CMA,  July 04, 2010 4:05 PM   New Allergies: ! HYDROCODONE Additional Follow-up for Phone Call Additional follow up Details #2::    done per emr Follow-up by: Corwin Levins MD,  July 04, 2010 5:18 PM  Additional Follow-up for Phone Call Additional follow up Details #3:: Details for Additional Follow-up Action Taken: Pt advised via VM, told to call back with any further questions or concerns Additional Follow-up by: Margaret Pyle, CMA,  July 05, 2010 8:19 AM  New/Updated Medications: TESSALON PERLES 100 MG CAPS (BENZONATATE) 1po three times a day as needed New Allergies: ! HYDROCODONEPrescriptions: TESSALON PERLES 100 MG CAPS (BENZONATATE) 1po three times a day as needed  #60 x 1   Entered and Authorized by:   Corwin Levins MD   Signed by:   Corwin Levins MD on 07/04/2010   Method used:   [facility]        [facility] [address] [phone]* (retail)       [address]       [place], [place]  [address]       Ph: [phone] or  [phone]       Fax: [phone]   RxID:   [id number]

## 2010-07-26 NOTE — Assessment & Plan Note (Signed)
Summary: CPX /NWS  #   Vital Signs:  Patient profile:   50 year old female Height:      66 inches Weight:      236.75 pounds BMI:     38.35 O2 Sat:      97 % on Room air Temp:     97.9 degrees F oral Pulse rate:   77 / minute BP sitting:   112 / 74  (left arm) Cuff size:   large  Vitals Entered By: Zella Ball Ewing CMA (AAMA) (April 04, 2010 1:23 PM)  O2 Flow:  Room air  Preventive Care Screening     has mammogram sched next month  CC: Adult Physical/RE   Primary Care Provider:  Oliver Barre, MD  CC:  Adult Physical/RE.  History of Present Illness: here for wellness,  mentions she is due for EGD and colonoscopy nov 10 with Dr Gerilyn Pilgrim ;  Pt denies CP, worsening sob, doe, wheezing, orthopnea, pnd, worsening LE edema, palps, dizziness or syncope  Pt denies new neuro symptoms such as headache, facial or extremity weakness  No fever, wt loss, night sweats, loss of appetite or other constitutional symptoms  Pt denies polydipsia, polyuria.  Overall good compliance with meds, trying to follow low chol, DM diet, wt stable, little excercise however Due for flu shot today..  ALso mentions several nightmares in the past 2 wks,  denies worsening depressoin, suicidal ideation , anixety or panic.  Also with occasional leg cramps, worse at night. Every night wakes up at 2 am, and hard to get back to sleep  Does not want sleep pill. 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   Preventive Screening-Counseling & Management      Drug Use:  no.    Problems Prior to Update: 1)  Special Screening For Malignant Neoplasms Colon  (ICD-V76.51) 2)  Abdominal Pain, Epigastric  (ICD-789.06) 3)  Glucose Intolerance  (ICD-271.3) 4)  Gerd  (ICD-530.81) 5)  Preventive Health Care  (ICD-V70.0) 6)  Ibs  (ICD-564.1) 7)  Osteoarthritis, Knees, Bilateral  (ICD-715.96) 8)  Headache  (ICD-784.0) 9)  Depression  (ICD-311) 10)   Anxiety  (ICD-300.00) 11)  Anemia-iron Deficiency  (ICD-280.9)  Medications Prior to Update: 1)  Estradiol 2 Mg Tabs (Estradiol) .... Take 1 Tablet By Mouth Once A Day 2)  Omeprazole 20 Mg Cpdr (Omeprazole) .... 2 By Mouth Once Daily 3)  Promethazine Hcl 25 Mg Tabs (Promethazine Hcl) .Marland Kitchen.. 1 By Mouth  Q 6 Hrs As Needed 4)  Zofran 4 Mg  Tabs (Ondansetron Hcl) .... Take One Pill As Needed For Nausea Every 5-6 Hours  Current Medications (verified): 1)  Omeprazole 20 Mg Cpdr (Omeprazole) .... 2 By Mouth Once Daily 2)  Estradiol 2 Mg Tabs (Estradiol) .Marland Kitchen.. 1 By Mouth Once Daily  Allergies (verified): 1)  ! Tylox 2)  ! Morphine 3)  ! Codeine 4)  ! Penicillin 5)  ! Amoxicillin  Past History:  Past Medical History: Last updated: 12/29/2009 Anemia-iron deficiency Anxiety Depression Obesity Headache- Migraine right knee severe DJD IBS (had flex sig in her 20's) Early menopause due to endometriosis GERD ? OAB - declines med trial    Past Surgical History: Last updated: 12/29/2009 Appendectomy- 1992 Cholecystectomy- 1991 GU surgery- 12/2001/bladder tack Hysterectomy- 1992 s/p bilat knee arthroscopic surgury Oophorectomy - bilat at 50 yo    Family History: Last updated: 12/29/2009 son with epilepsy father died with spinal  meningitis at 63yo mother with HTN   Social History: Last updated: 04/04/2010 Married 2 children Homemaker Never Smoked Alcohol use-no  Drug use-no  Risk Factors: Smoking Status: never (01/16/2009)  Social History: Reviewed history from 12/29/2009 and no changes required. Married 2 children Homemaker Never Smoked Alcohol use-no  Drug use-no Drug Use:  no  Review of Systems  The patient denies anorexia, fever, vision loss, decreased hearing, hoarseness, chest pain, syncope, dyspnea on exertion, peripheral edema, prolonged cough, headaches, hemoptysis, abdominal pain, melena, hematochezia, severe indigestion/heartburn, hematuria, muscle  weakness, suspicious skin lesions, transient blindness, difficulty walking, depression, unusual weight change, abnormal bleeding, enlarged lymph nodes, and angioedema.         all otherwise negative per pt -    Physical Exam  General:  alert and overweight-appearing.   Head:  normocephalic and atraumatic.   Eyes:  vision grossly intact, pupils equal, and pupils round.   Ears:  R ear normal and L ear normal.   Nose:  no external deformity and no nasal discharge.   Mouth:  no gingival abnormalities and pharynx pink and moist.   Neck:  supple and no masses.   Lungs:  normal respiratory effort and normal breath sounds.   Heart:  normal rate and regular rhythm.   Abdomen:  soft, non-tender, and normal bowel sounds.   Msk:  no joint tenderness and no joint swelling.   Extremities:  no edema, no erythema  Neurologic:  strength normal in all extremities, sensation intact to light touch, gait normal, and DTRs symmetrical and normal.   Skin:  color normal and no rashes.   Psych:  not depressed appearing and moderately anxious.     Impression & Recommendations:  Problem # 1:  Preventive Health Care (ICD-V70.0) Overall doing well, age appropriate education and counseling updated and referral for appropriate preventive services done unless declined, immunizations up to date or declined, diet counseling done if overweight, urged to quit smoking if smokes , most recent labs reviewed and current ordered if appropriate, ecg reviewed or declined (interpretation per ECG scanned in the EMR if done); information regarding Medicare Prevention requirements given if appropriate; speciality referrals updated as appropriate  Orders: EKG w/ Interpretation (93000)  Problem # 2:  HYPERSOMNIA (ICD-780.54)  ? OSA vs other primary sleep issue - for pulm referral   Orders: Pulmonary Referral (Pulmonary)  Complete Medication List: 1)  Omeprazole 20 Mg Cpdr (Omeprazole) .... 2 by mouth once daily 2)  Estradiol 2  Mg Tabs (Estradiol) .Marland Kitchen.. 1 by mouth once daily  Other Orders: Admin 1st Vaccine ([address]) Flu Vaccine 68yrs + [phone])  Patient Instructions: 1)  you had the flu shot today 2)  You will be contacted about the referral(s) to: pulmonary for the sleep issue 3)  Continue all previous medications as before this visit 4)  Please keep your followup appt with Dr Christella Hartigan as planned 5)  Please schedule a follow-up appointment in 1 year, or sooner if needed Prescriptions: OMEPRAZOLE 20 MG CPDR (OMEPRAZOLE) 2 by mouth once daily  #60 x 11   Entered and Authorized by:   Corwin Levins MD   Signed by:   Corwin Levins MD on 04/04/2010   Method used:   Print then Give to Patient   RxID:   [id number]      Flu Vaccine Consent Questions     Do you have a history of severe allergic reactions to this vaccine? no    Any prior history of allergic reactions  to egg and/or gelatin? no    Do you have a sensitivity to the preservative Thimersol? no    Do you have a past history of Guillan-Barre Syndrome? no    Do you currently have an acute febrile illness? no    Have you ever had a severe reaction to latex? no    Vaccine information given and explained to patient? yes    Are you currently pregnant? no    Lot Number:[id number]   Exp Date:12/22/2010   Site Given  Left Deltoid IMu

## 2010-07-31 ENCOUNTER — Ambulatory Visit (INDEPENDENT_AMBULATORY_CARE_PROVIDER_SITE_OTHER): Payer: 59 | Admitting: Psychology

## 2010-07-31 DIAGNOSIS — F411 Generalized anxiety disorder: Secondary | ICD-10-CM

## 2010-08-07 ENCOUNTER — Ambulatory Visit (INDEPENDENT_AMBULATORY_CARE_PROVIDER_SITE_OTHER): Payer: 59 | Admitting: Psychology

## 2010-08-07 DIAGNOSIS — F411 Generalized anxiety disorder: Secondary | ICD-10-CM

## 2010-08-09 ENCOUNTER — Encounter: Payer: Self-pay | Admitting: Internal Medicine

## 2010-08-14 ENCOUNTER — Ambulatory Visit (INDEPENDENT_AMBULATORY_CARE_PROVIDER_SITE_OTHER): Payer: 59 | Admitting: Psychology

## 2010-08-14 DIAGNOSIS — F411 Generalized anxiety disorder: Secondary | ICD-10-CM

## 2010-08-21 ENCOUNTER — Ambulatory Visit: Payer: 59 | Admitting: Psychology

## 2010-08-21 NOTE — Letter (Signed)
Summary: [facility]   Imported By: Sherian Rein 08/17/2010 10:33:23  _____________________________________________________________________  External Attachment:    Type:   Image     Comment:   External Document

## 2010-08-22 ENCOUNTER — Other Ambulatory Visit: Payer: Self-pay | Admitting: Interventional Radiology

## 2010-08-22 DIAGNOSIS — I8393 Asymptomatic varicose veins of bilateral lower extremities: Secondary | ICD-10-CM

## 2010-08-22 DIAGNOSIS — I83819 Varicose veins of unspecified lower extremities with pain: Secondary | ICD-10-CM

## 2010-08-22 DIAGNOSIS — I8390 Asymptomatic varicose veins of unspecified lower extremity: Secondary | ICD-10-CM

## 2010-08-22 DIAGNOSIS — I83811 Varicose veins of right lower extremities with pain: Secondary | ICD-10-CM

## 2010-08-30 ENCOUNTER — Ambulatory Visit: Payer: 59 | Admitting: Internal Medicine

## 2010-09-07 ENCOUNTER — Encounter: Payer: Self-pay | Admitting: Gastroenterology

## 2010-09-07 ENCOUNTER — Ambulatory Visit (INDEPENDENT_AMBULATORY_CARE_PROVIDER_SITE_OTHER): Payer: 59 | Admitting: Gastroenterology

## 2010-09-07 DIAGNOSIS — R12 Heartburn: Secondary | ICD-10-CM

## 2010-09-11 ENCOUNTER — Ambulatory Visit: Payer: 59

## 2010-09-11 ENCOUNTER — Other Ambulatory Visit: Payer: 59

## 2010-09-11 NOTE — Assessment & Plan Note (Signed)
  Review of gastrointestinal problems: 1. Hyperplastic colon polyp, November 2011.  Next colonoscopy at 10 year interval 2. GERD-like dyspepsia:  EGD November 2011 , small hiatal hernia otherwise normal; Omeprazole helped symptoms but caused welts on body; H2 blocker OTC did not help (march 2012).    Vital Signs:  Patient profile:   50 year old female Height:      66 inches Weight:      236 pounds BMI:     38.23 BSA:     2.15 Pulse rate:   64 / minute Pulse rhythm:   regular BP sitting:   128 / 72  (left arm) Cuff size:   large  Vitals Entered By: Ok Anis CMA (September 07, 2010 9:43 AM)  Visit Type:  Follow-up Visit Referring Provider:  na Primary Care Provider:  Oliver Barre, MD   Chief Complaint:  follow up .  History of Present Illness:      pleasant 50 year old woman whom I last saw about 2 months ago.   over-the-counterh2 blocker at bedtime was not helping  her rather classic GERD symptoms.  still had nausea in AM, night time GERD symptoms.  History of Present Illness Visit Type: Follow-up Visit Primary GI MD: Rob Bunting MD Primary Provider: Oliver Barre, MD  Requesting Provider: na Chief Complaint: follow up          Current Medications (verified): 1)  Estradiol 2 Mg Tabs (Estradiol) .Marland Kitchen.. 1 By Mouth Once Daily 2)  Zofran 4 Mg  Tabs (Ondansetron Hcl) .... Take One Pill Every 12 Hours As Needed For Nausea 3)  Doxycycline Hyclate 100 Mg Caps (Doxycycline Hyclate) .... One Capsule By Mouth Two Times A Day For 14 Days  Allergies (verified): 1)  ! Tylox 2)  ! Morphine 3)  ! Codeine 4)  ! Penicillin 5)  ! Amoxicillin 6)  ! Hydrocodone 7)  ! * Omeprazole 8)  ! * Azithromycin  Physical Exam  Additional Exam:  Constitutional:  Obese, otherwise generally well appearing Psychiatric: alert and oriented times 3 Abdomen: soft, non-tender, non-distended, normal bowel sounds    Impression & Recommendations:  Problem # 1:   GERD  proton pump inhibitor,  omeprazole caused welts all over her body and so I try to treat her  rather classic GERD symptoms with H2 blockers instead. Single over-the-counter dosing at bedtime was not helpful and so we will increase this. She will return to see me in 2 months and sooner if needed.  Patient Instructions: 1)  Take new randitidine pills, twice daily. 2)  Return to see Dr. Christella Hartigan in 2 months. 3)  The medication list was reviewed and reconciled.  All changed / newly prescribed medications were explained.  A complete medication list was provided to the patient / caregiver. Prescriptions: RANITIDINE HCL 300 MG  TABS (RANITIDINE HCL) take one pill at bedtime, take one pill in AM.  #60 x 3   Entered and Authorized by:   Rachael Fee MD   Signed by:   Rachael Fee MD on 09/07/2010   Method used:   Electronically to        [facility] [address] [phone]* (retail)       [address]       [place], [place]  [address]       Ph: [phone] or [phone]       Fax: [phone]   RxID:   [id number]

## 2010-09-18 ENCOUNTER — Other Ambulatory Visit: Payer: 59

## 2010-09-25 ENCOUNTER — Other Ambulatory Visit: Payer: 59

## 2010-09-25 ENCOUNTER — Ambulatory Visit: Payer: 59

## 2010-10-02 ENCOUNTER — Other Ambulatory Visit: Payer: 59

## 2010-10-03 ENCOUNTER — Inpatient Hospital Stay (INDEPENDENT_AMBULATORY_CARE_PROVIDER_SITE_OTHER)
Admission: RE | Admit: 2010-10-03 | Discharge: 2010-10-03 | Disposition: A | Discharge status: Home / Self Care | Payer: 59 | Source: Ambulatory Visit | Attending: Emergency Medicine | Admitting: Emergency Medicine

## 2010-10-03 DIAGNOSIS — IMO0002 Reserved for concepts with insufficient information to code with codable children: Secondary | ICD-10-CM

## 2010-10-17 ENCOUNTER — Ambulatory Visit: Payer: 59

## 2010-10-17 ENCOUNTER — Other Ambulatory Visit: Payer: 59

## 2010-10-24 ENCOUNTER — Other Ambulatory Visit: Payer: 59

## 2010-11-06 NOTE — Op Note (Signed)
[name], [name]              ACCOUNT NO.:  [id number]   MEDICAL RECORD NO.:  [id number]          PATIENT TYPE:  AMB   LOCATION:  DSC                          FACILITY:  [facility]   PHYSICIAN:  Loreta Ave, M.D. DATE OF BIRTH:  1960-10-15   DATE OF PROCEDURE:  08/13/2007  DATE OF DISCHARGE:                               OPERATIVE REPORT   PREOPERATIVE DIAGNOSIS:  Left knee medial meniscus tear, chondromalacia  patella.   POSTOPERATIVE DIAGNOSIS:  Left knee medial meniscus tear, chondromalacia  patella, with also lateral meniscus tear and grade 3 changes medial AND  lateral compartments.   PROCEDURE:  Left knee exam under anesthesia, arthroscopy, partial medial  AND lateral meniscectomy.  Chondroplasty all three compartments, most  marked medial femoral condyle AND patella.   SURGEON:  Loreta Ave, M.D.   ASSISTANT:  Genene Churn. Denton Meek.   ANESTHESIA:  General   BLOOD LOSS:  Minimal.   SPECIMENS:  None.   CULTURES:  None.   COMPLICATIONS:  None.   DRESSING:  Soft compressive.   PROCEDURE:  The patient was brought to the operating room and placed on  the operating table in the supine position.  After adequate anesthesia  had been obtained, knee examined.  Full motion and stable ligaments.  Lateral tracking, some crepitus, but no tethering.  Tourniquet and leg  holder applied.  Leg prepped and draped in the usual sterile fashion.  Three portals were created, one superolateral, one each medial and  lateral parapatellar.  Inflow catheter induced.  Knee distended.  Arthroscope was introduced, knee inspected.  Lateral tracking, but no  tethering.  Grade 2 and 3 chondromalacia peak of patella, treated with  chondroplasty.  Trochlea looked good.  Hypertrophic synovitis removed.  Medially complex tearing entire posterior half of the medial meniscus.  Softly stretched out with baskets and tapered down smoothly with a  shaver.  Unfortunately, some graphic grade 3  changes over much of the  weightbearing dome of the condyle, especially posteriorly.  Chondroplasty to a stable surface.  Nothing grade 4.  ACL had some  attrition but was still intact and functional.  Lateral compartment  grade 2 and 3 changes on both the tibia and on the femur, more posterior  than anterior.  No new chondral tears there.  Flap tears front and back  of lateral meniscus were both debrided and tapered down smoothly.  The  entire knee examined.  No other findings  appreciated.  Instruments and fluid removed.  Portals of the knee were  injected with Marcaine.  Portals closed with 4-0 nylon.  Sterile  compressive dressing applied.  Anesthesia reversed.  Brought to the  recovery room.  Tolerated surgery well.  No complications.      Loreta Ave, M.D.  Electronically Signed     DFM/MEDQ  D:  08/13/2007  T:  08/14/2007  Job:  [id number]

## 2010-11-09 NOTE — H&P (Signed)
   NAME:  [name], [name]                        ACCOUNT NO.:  [id number]   MEDICAL RECORD NO.:  [id number]                   PATIENT TYPE:  OBV   LOCATION:  0373                                 FACILITY:  [facility]   PHYSICIAN:  Sigmund I. Patsi Sears, M.D.         DATE OF BIRTH:  08/22/1960   DATE OF ADMISSION:  01/21/2002  DATE OF DISCHARGE:                                HISTORY & PHYSICAL   HISTORY:  The patient is a 50 year old white married female, with a history  of urinary incontinence, wearing five pads per day.  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  She is status  post hysterectomy in the past.  Her past history is also significant for  irritable bowel syndrome, endometriosis in 1994, cholecystectomy, and C-  section x2.  She has positive Gaynell Face test and positive Q-Tip test.   The patient underwent SPARC to the vaginal sling and anterior vaginal vault  repair on Thursday, July 31, and did well until Friday, August 1, when she  developed significant abdominal and pubic pain and spasm, barely relieved  with IV Dilaudid.  She is intolerant of penicillin and morphine.  She is  therefore admitted for pain/spasm control.  It is noted that her surgical  intervention was difficult, particularly with passage of the [facility] needles  retropubically.   PAST MEDICAL HISTORY:  Otherwise noncontributory.   HABITS:  Tobacco:  None.  Alcohol:  None.   MEDICATIONS:  Estrace 3 mg 1 p.o. each night.   SOCIAL HISTORY:  The patient lives at home with her husband.  She has her  own house cleaning business.   PHYSICAL EXAMINATION:  GENERAL:  An obese white female in no acute distress.  VITAL SIGNS:  Temperature 97.7, heart rate 76, respiratory rate 18, blood  pressure 132/70.  HEENT:  PERRL.  EOM full.  NECK:  Nontender, no nodes.  CHEST:  Clear to P&A.  ABDOMEN:  Soft plus bowel sounds.  No organomegaly without masses.  GENITALIA:  Normal female  external genitalia.  The patient has right lower  quadrant pain and suprapubic pain, pain over her suprapubic incisions.  Foley is draining clear urine.  EXTREMITIES:  Without cyanosis or edema.  NEUROLOGIC:  Physiologic.   ADMISSION IMPRESSION:  Postoperative pain and spasm.  We will observe for  possible urinoma formation.   PLAN:  We will admit after 23-hour observation.                                                Sigmund I. Patsi Sears, M.D.    SIT/MEDQ  D:  01/22/2002  T:  01/27/2002  Job:  [id number]

## 2010-11-09 NOTE — Op Note (Signed)
[name], [name]                       ACCOUNT NO.:  [id number]   MEDICAL RECORD NO.:  [id number]                   PATIENT TYPE:  AMB   LOCATION:  DAY                                  FACILITY:  [facility]   PHYSICIAN:  Sigmund I. Patsi Sears, M.D.         DATE OF BIRTH:  09/17/1960   DATE OF PROCEDURE:  01/21/2002  DATE OF DISCHARGE:                                 OPERATIVE REPORT   PREOPERATIVE DIAGNOSES:  Urinary incontinence.   PREOPERATIVE DIAGNOSES:  Urinary incontinence.   PROCEDURE:  Pubovaginal sling [facility]) with associated anterior vaginal vault  repair.   SURGEON:  Sigmund I. Patsi Sears, M.D.   ANESTHESIA:  General LMA.   PREPARATION:  After appropriate preanesthesia, the patient was brought to  the operating room, placed on the operating table in the dorsal supine  position where general LMA anesthesia was introduced. She was then replaced  in the dorsal lithotomy position. The pubis was prepped with Betadine  solution and draped in the usual fashion.   HISTORY OF PRESENT ILLNESS:  This 50 year old female has five pad per day  stress urinary incontinence, with a history of irritable bowel syndrome,  hysterectomy secondary to endometriosis in 1994, cholecystectomy in 1990,  and C section x2 in the past. She is now for a pubovaginal sling and  anterior vaginal vault repair. 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   DESCRIPTION OF PROCEDURE:  The 10 cc of Marcaine 0.5% with 1:200,000  epinephrine was injected into the anterior vaginal space, in the midline.  Two separate incisions are made, one in the mid urethra, and one in the more  proximal anterior vaginal wall. The subcutaneous tissue was dissected  bilaterally with sharp and blunt dissection. Two incisions were then made  above the pubis lateralward, measuring 1 cm each. The [facility] needles were  then placed bilaterally, and cystoscopy was accomplished. It is noted that  the needles were within  the bladder bilaterally, and multiple attempts were  accomplished before the [facility] could be placed without cystoscopic evidence  of needles in the bladder. Following this, the [facility] sling was placed in  standard fashion, with a right angled clamp behind the [facility] sling, to  ensure that it was not in too tight. The right and left wings of the [facility]  were then cut below the level of the skin, and the sleeves removed. The  anterior urethral tissue was then closed in two layers with 3-0 Vicryl  suture.   An incision was then made in the anterior vaginal wall, subcutaneous tissue  dissected with blunt and sharp dissection. Kelly plication was accomplished  over the 2 x 7 portion of Tutoplast fascia, in order to affect a good  anterior vaginal vault repair. Following this, the anterior vaginal vault  was closed in two layers with 3-0 Vicryl suture. It was elected not to  remove any of the anterior vaginal wall.   The patient tolerated the  procedure, repeat cystoscopy showed good affect of  the sling and anterior vaginal repair but no evidence of any sutures on the  bladder.   Estrace cream was then used to lubricate the packing, and this was placed in  the vagina. The patient was then awakened and taken to the recovery room in  good condition.                                               Sigmund I. Patsi Sears, M.D.    SIT/MEDQ  D:  01/21/2002  T:  01/26/2002  Job:  [id number]

## 2010-11-09 NOTE — Assessment & Plan Note (Signed)
HISTORY OF PRESENT ILLNESS:  A 50 year old female seen in initial evaluation  on January 31, 2004. My impression at that time was occipital neuralgia,  possible cervical radiculopathy, as well as some irritable bowel and sleep  disturbance. She did return with her MRI of her C-spine today as well as MRI  of the brain. MRI of the brain had no significant abnormalities. MRI of the  C-spine showed some minimal foraminal stenosis, right C2-3 and left C5-6, 6-  7. No central stenosis that I can appreciate. Was tried on Celebrex last  time, given that she had some improvements on Advil in terms of her  headaches but this was not any better than Advil. Suggested occipital nerve  block but the patient declines this. Her pain is around 4 out of 10. She  does have some nocturnal arm numbness and hand numbness. Most of her pain is  left upper trapezius area and then radiating around to the left temporal  area.   PHYSICAL EXAMINATION:  VITAL SIGNS:  Blood pressure 148/77, pulse 69,  respiratory rate 17, O2 sat is 97% on room air.  NEUROLOGIC:  Gait is normal. Affect is alert. Appearance normal. Motor  strength 5/5 bilateral deltoid, biceps, triceps, grip as well as hip  flexion, knee extension, ankle __________ extension. She has normal  sensation bilateral upper extremities. She has normal Phalen's. She has  normal deep tendon reflexes bilateral upper and lower extremities. She has  normal range of motion bilateral and upper lower extremities. She has pain  to palpation left upper trapezius as well as left mastoid area, less so over  the sternocleidomastoid. No significant pain over the cervical paraspinal's.  She has neck range of motion  75% forward flexion, extension, lateral  rotation, and bending with pain __________ in all planes.   IMPRESSION:  1.  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  2.  Hand pain and numbness, mainly at night, rule out carpal tunnel      syndrome.   PLAN:  1.   Will get EMG, NCV bilateral upper extremities.  2.  Trigger point injections left trapezius and longissimus capitis, using      1/2 inch needle and Lidocaine.  3.  I will see her back for the above. She is receiving physical therapy      ordered by Dr. Newell Coral, which is something that I would suggest as      well.             ___________________________________________  Erick Colace, M.D.    AEK/MedQ  D:  03/29/2004 15:57:39  T:  03/29/2004 23:48:02  Job #:  [id number]   cc:   Hewitt Shorts, M.D.  [address]  [place]  [place] [address]  Fax: [phone]   Corwin Levins, M.D. [facility]

## 2010-11-21 ENCOUNTER — Encounter: Payer: Self-pay | Admitting: Gastroenterology

## 2010-11-21 ENCOUNTER — Ambulatory Visit (INDEPENDENT_AMBULATORY_CARE_PROVIDER_SITE_OTHER): Payer: 59 | Admitting: Gastroenterology

## 2010-11-21 VITALS — BP 120/76 | HR 64 | Ht 66.0 in | Wt 237.6 lb

## 2010-11-21 DIAGNOSIS — R1013 Epigastric pain: Secondary | ICD-10-CM

## 2010-11-21 DIAGNOSIS — K3189 Other diseases of stomach and duodenum: Secondary | ICD-10-CM

## 2010-11-21 DIAGNOSIS — K219 Gastro-esophageal reflux disease without esophagitis: Secondary | ICD-10-CM

## 2010-11-21 MED ORDER — RANITIDINE HCL 300 MG PO TABS: 300.0000 mg | ORAL_TABLET | Freq: Two times a day (BID) | ORAL | Status: DC

## 2010-11-21 MED ORDER — SUCRALFATE 1 GM/10ML PO SUSP: 1.0000 g | Freq: Four times a day (QID) | ORAL | Status: DC

## 2010-11-21 NOTE — Patient Instructions (Addendum)
GERD handout given. New medicine called in to your pharmacy, carafate. Call Dr. Christella Hartigan' office in 4-5 weeks to report on your symptoms. A copy of this information will be made available to Dr. Jonny Ruiz.   Acid Reflux (GERD) Acid reflux is also called gastroesophageal reflux disease (GERD). Your stomach makes acid to help digest food. Acid reflux happens when acid from your stomach goes into the tube between your mouth and stomach (esophagus). Your stomach is protected from the acid, but this tube is not. When acid gets into the tube, it may cause a burning feeling in the chest (heartburn). Besides heartburn, other health problems can happen if the acid keeps going into the tube. Some causes of acid reflux include:  Being overweight.   Smoking.   Drinking alcohol.   Eating large meals.   Eating meals and then going to bed right away.   Eating certain foods.   Increased stomach acid production.  HOME CARE  Take all medicine as told by your doctor.   You may need to:   Lose weight.   Avoid alcohol.   Quit smoking.   Do not eat big meals. It is better to eat smaller meals throughout the day.   Do not eat a meal and then nap or go to bed.   Sleep with your head higher than your stomach.   Avoid foods that bother you.   You may need more tests, or you may need to see a special doctor.  GET HELP RIGHT AWAY IF:  You have chest pain that is different than before.   You have pain that goes to your arms, jaw, or between your shoulder blades.   You throw up (vomit) blood, dark brown liquid, or your throw up looks like coffee grounds.   You have trouble swallowing.   You have trouble breathing or cannot stop coughing.   You feel dizzy or pass out.   Your skin is cool, wet, and pale.   Your medicine is not helping.  MAKE SURE YOU:   Understand these instructions.   Will watch your condition.   Will get help right away if you are not doing well or get worse.  Document  Released: 11/27/2007 Document Re-Released: 09/04/2009 [facility] Patient Information 2011 [place], [place].

## 2010-11-21 NOTE — Progress Notes (Signed)
Review of gastrointestinal problems:  1. Hyperplastic colon polyp, November 2011. Next colonoscopy at 10 year interval 2. GERD related nausea; much improved on PPI but had rash (welts) that resolved after stopping PPI  HPI: This is a 50 year old woman whom I last saw about 2 months ago. At that point we changed her from proton pump inhibitor and H2 blocker for what was probably an allergic reaction to the proton pump inhibitor.  When she eats something hot, then drinks something she has feeling of throat closing up.  She felt much better on PPI.  H2 blocker ranitidine 300 twice daily.  Started having itching on legs recently as well.    Still has nausea in AM, doesn't feel well.  The H2 blocker helped a lot at first, but then isn't helping now.  No real NSAIDs.  Overall stable weight, obese.      Physical Exam: BP 120/76  Pulse 64  Ht 5\' 6"  (1.676 m)  Wt 237 lb 9.6 oz (107.775 kg)  BMI 38.35 kg/m2 Constitutional: generally well-appearing Psychiatric: alert and oriented x3 Abdomen: soft, nontender, nondistended, no obvious ascites, no peritoneal signs, normal bowel sounds    Assessment and plan: 50 y.o. female with GERD-like symptoms nausea, dyspepsia  Her symptoms clearly improved with proton pump inhibitor but she had an allergic reaction to it. H2 blocker is not as effective. She'll continue H2 blocker twice daily and we will add Carafate 3-4 times a day. She will call to report on her symptoms and 4-5 weeks.

## 2011-01-13 ENCOUNTER — Inpatient Hospital Stay (INDEPENDENT_AMBULATORY_CARE_PROVIDER_SITE_OTHER)
Admission: RE | Admit: 2011-01-13 | Discharge: 2011-01-13 | Disposition: A | Discharge status: Home / Self Care | Payer: 59 | Source: Ambulatory Visit | Attending: Family Medicine | Admitting: Family Medicine

## 2011-01-13 DIAGNOSIS — IMO0002 Reserved for concepts with insufficient information to code with codable children: Secondary | ICD-10-CM

## 2011-02-18 ENCOUNTER — Telehealth: Payer: Self-pay | Admitting: Gastroenterology

## 2011-02-18 NOTE — Telephone Encounter (Signed)
Pt is having nausea daily she is taking the carafate and ranitidine with little relief. She was prescribed Zofran but has not taken it because she "doesn't want to take that everyday"   She reports some blurred vision that started when she started the carafate.  Pt says she doesn't feel like Dr Christella Hartigan cares because he hasn't done anything to help.  Please advise

## 2011-02-19 NOTE — Telephone Encounter (Signed)
Needs next available rov with me thanks

## 2011-02-19 NOTE — Telephone Encounter (Signed)
Pt scheduled  

## 2011-02-19 NOTE — Telephone Encounter (Signed)
Left message on machine to call back  

## 2011-03-18 ENCOUNTER — Ambulatory Visit (INDEPENDENT_AMBULATORY_CARE_PROVIDER_SITE_OTHER): Payer: 59 | Admitting: Gastroenterology

## 2011-03-18 ENCOUNTER — Encounter: Payer: Self-pay | Admitting: Gastroenterology

## 2011-03-18 VITALS — BP 122/74 | HR 100 | Ht 66.0 in | Wt 243.4 lb

## 2011-03-18 DIAGNOSIS — R110 Nausea: Secondary | ICD-10-CM

## 2011-03-18 NOTE — Progress Notes (Signed)
Review of gastrointestinal problems:  1. Hyperplastic colon polyp, November 2011. Next colonoscopy at 10 year interval  2. GERD related nausea; much improved on PPI but had rash (welts) that resolved after stopping PPI; Carafate might have helped in 2012 that she had blurred vision from it.    small hiatal hernia otherwise normal EGD 11/11.   HPI: This is a   pleasant 50 year old woman whom I last saw about 2 months ago. And  Stomach still bothers her.  Has AM nausea.  Doesn't feel good in PM either: nauseas.  Pains in stomach.  "little sharp pains."  She cannot eat spaghetti, pizza, salad dressings, certain kinds of oils on fish.  She has itchy skin.  Not sure what it is from.  She drinks a lot of water.  Will sip on coke periodically.  Drinks tea but not everyday.    Drinks chocolate mild.  Needs to water down OJ.    Non-smoker.  Non-drinker.  Has candy bar periodically.  Eats mints daily (altoid).  Eats 3-6 small meals a day.    Past Medical History:   Hyperplastic colon polyp                                       Comment:04/2010   GERD (gastroesophageal reflux disease)                       Anemia                                                       Anxiety                                                      Depression                                                   Obesity                                                      Migraine                                                     DJD (degenerative joint disease)                               Comment:knee   IBS (irritable bowel syndrome)                               Menopause  Comment:early  Past Surgical History:   CHOLECYSTECTOMY                                              COLON SURGERY                                                APPENDECTOMY                                                 bladder tac                                                 ABDOMINAL HYSTERECTOMY                                       KNEE ARTHROSCOPY                                             OOPHORECTOMY                                                 reports that she has never smoked. She has never used smokeless tobacco. She reports that she does not drink alcohol or use illicit drugs.  family history includes Diabetes in her brother and Hypertension in her mother.    Current medicines and allergies were reviewed in [facility]    Physical Exam: BP 122/74  Pulse 100  Ht 5\' 6"  (1.676 m)  Wt 243 lb 6.4 oz (110.406 kg)  BMI 39.29 kg/m2 Constitutional: generally well-appearing Psychiatric: alert and oriented x3 Abdomen: soft, nontender, nondistended, no obvious ascites, no peritoneal signs, normal bowel sounds     Assessment and plan: 50 y.o. female with  morbid obesity, chronic nausea, dyspepsia  Many of her GI symptoms improved with proton pump inhibitor however she had skin rash with this. Pepcid, H2 blockers help somewhat but not completely. She takes Carafate caused blurry vision however is seen no reported that on my drug index system. She has morbid obesity I suspect that contributes at least somewhat to her symptoms. Perhaps she has mild gastroparesis. She does heal better when she eats several small meals a day rather than 2 or 3 larger ones. We will set her up with a Gastric emptying scan.

## 2011-03-18 NOTE — Patient Instructions (Addendum)
Gastric emptying scan to check stomach function.  [name] 04/05/11 10 am arrive 945 am nothing to eat or drink after midnight DO NOT TAKE ANY STOMACH MEDS 24 hours prior A copy of this information will be made available to Dr. Jonny Ruiz.

## 2011-03-22 ENCOUNTER — Telehealth: Payer: Self-pay

## 2011-03-22 DIAGNOSIS — Z0000 Encounter for general adult medical examination without abnormal findings: Secondary | ICD-10-CM

## 2011-03-22 NOTE — Telephone Encounter (Signed)
Put order in for physical labs. 

## 2011-03-28 ENCOUNTER — Ambulatory Visit (INDEPENDENT_AMBULATORY_CARE_PROVIDER_SITE_OTHER): Payer: 59

## 2011-03-28 DIAGNOSIS — Z23 Encounter for immunization: Secondary | ICD-10-CM

## 2011-04-05 ENCOUNTER — Other Ambulatory Visit (HOSPITAL_COMMUNITY): Payer: 59

## 2011-04-05 ENCOUNTER — Encounter (HOSPITAL_COMMUNITY)
Admission: RE | Admit: 2011-04-05 | Discharge: 2011-04-05 | Disposition: A | Discharge status: Home / Self Care | Payer: 59 | Source: Ambulatory Visit | Attending: Gastroenterology | Admitting: Gastroenterology

## 2011-04-05 DIAGNOSIS — R110 Nausea: Secondary | ICD-10-CM | POA: Insufficient documentation

## 2011-04-05 MED ORDER — TECHNETIUM TC 99M SULFUR COLLOID
2.2000 | Freq: Once | INTRAVENOUS | Status: AC | PRN
  Administered 2011-04-05: 2.2 via INTRAVENOUS

## 2011-04-17 ENCOUNTER — Other Ambulatory Visit: Payer: Self-pay | Admitting: Gastroenterology

## 2011-04-17 MED ORDER — RANITIDINE HCL 300 MG PO TABS: 300.0000 mg | ORAL_TABLET | Freq: Two times a day (BID) | ORAL | Status: DC

## 2011-04-17 NOTE — Telephone Encounter (Signed)
Pt was notified that rx was sent. 

## 2011-04-19 ENCOUNTER — Telehealth: Payer: Self-pay | Admitting: Gastroenterology

## 2011-04-19 NOTE — Telephone Encounter (Signed)
Pt informed she would need to purchase this over the counter because she can not take PPI due to rash she agreed

## 2011-05-02 ENCOUNTER — Other Ambulatory Visit: Payer: Self-pay | Admitting: Gastroenterology

## 2011-05-17 ENCOUNTER — Other Ambulatory Visit (INDEPENDENT_AMBULATORY_CARE_PROVIDER_SITE_OTHER): Payer: 59

## 2011-05-17 ENCOUNTER — Other Ambulatory Visit: Payer: Self-pay | Admitting: Internal Medicine

## 2011-05-17 DIAGNOSIS — Z0000 Encounter for general adult medical examination without abnormal findings: Secondary | ICD-10-CM

## 2011-05-17 LAB — CBC WITH DIFFERENTIAL/PLATELET
Basophils Absolute: 0 10*3/uL (ref 0.0–0.1)
Eosinophils Absolute: 0.2 10*3/uL (ref 0.0–0.7)
Eosinophils Relative: 2.6 % (ref 0.0–5.0)
HCT: 40.4 % (ref 36.0–46.0)
Lymphs Abs: 2.2 10*3/uL (ref 0.7–4.0)
MCHC: 33.9 g/dL (ref 30.0–36.0)
MCV: 89.8 fl (ref 78.0–100.0)
Monocytes Absolute: 0.4 10*3/uL (ref 0.1–1.0)
Neutrophils Relative %: 54.6 % (ref 43.0–77.0)
Platelets: 190 10*3/uL (ref 150.0–400.0)
RDW: 12.8 % (ref 11.5–14.6)
WBC: 6.1 10*3/uL (ref 4.5–10.5)

## 2011-05-17 LAB — BASIC METABOLIC PANEL
Calcium: 9.3 mg/dL (ref 8.4–10.5)
GFR: 85.06 mL/min (ref >60.00)
Glucose, Bld: 112 mg/dL — ABNORMAL HIGH (ref 70–99)
Potassium: 5.2 mEq/L — ABNORMAL HIGH (ref 3.5–5.1)
Sodium: 140 mEq/L (ref 135–145)

## 2011-05-17 LAB — HEPATIC FUNCTION PANEL
AST: 34 U/L (ref 0–37)
Albumin: 3.7 g/dL (ref 3.5–5.2)
Alkaline Phosphatase: 43 U/L (ref 39–117)
Total Bilirubin: 0.9 mg/dL (ref 0.3–1.2)

## 2011-05-17 LAB — URINALYSIS, ROUTINE W REFLEX MICROSCOPIC
Leukocytes, UA: NEGATIVE
Specific Gravity, Urine: 1.02 (ref 1.000–1.030)
Urine Glucose: NEGATIVE
Urobilinogen, UA: 0.2 (ref 0.0–1.0)
pH: 7 (ref 5.0–8.0)

## 2011-05-17 LAB — LIPID PANEL: Total CHOL/HDL Ratio: 3

## 2011-05-17 LAB — LDL CHOLESTEROL, DIRECT: Direct LDL: 86.5 mg/dL

## 2011-05-18 ENCOUNTER — Encounter: Payer: Self-pay | Admitting: Internal Medicine

## 2011-05-18 DIAGNOSIS — R7302 Impaired glucose tolerance (oral): Secondary | ICD-10-CM

## 2011-05-18 DIAGNOSIS — Z0001 Encounter for general adult medical examination with abnormal findings: Secondary | ICD-10-CM | POA: Insufficient documentation

## 2011-05-18 HISTORY — DX: Impaired glucose tolerance (oral): R73.02

## 2011-05-21 ENCOUNTER — Encounter: Payer: Self-pay | Admitting: Internal Medicine

## 2011-05-22 ENCOUNTER — Ambulatory Visit (INDEPENDENT_AMBULATORY_CARE_PROVIDER_SITE_OTHER): Payer: 59 | Admitting: Internal Medicine

## 2011-05-22 ENCOUNTER — Encounter: Payer: Self-pay | Admitting: Internal Medicine

## 2011-05-22 VITALS — BP 122/78 | HR 77 | Temp 98.1°F | Ht 66.0 in | Wt 244.0 lb

## 2011-05-22 DIAGNOSIS — F3289 Other specified depressive episodes: Secondary | ICD-10-CM

## 2011-05-22 DIAGNOSIS — IMO0002 Reserved for concepts with insufficient information to code with codable children: Secondary | ICD-10-CM

## 2011-05-22 DIAGNOSIS — K3184 Gastroparesis: Secondary | ICD-10-CM | POA: Insufficient documentation

## 2011-05-22 DIAGNOSIS — R7302 Impaired glucose tolerance (oral): Secondary | ICD-10-CM

## 2011-05-22 DIAGNOSIS — F329 Major depressive disorder, single episode, unspecified: Secondary | ICD-10-CM

## 2011-05-22 DIAGNOSIS — F411 Generalized anxiety disorder: Secondary | ICD-10-CM

## 2011-05-22 DIAGNOSIS — M1710 Unilateral primary osteoarthritis, unspecified knee: Secondary | ICD-10-CM

## 2011-05-22 DIAGNOSIS — Z0000 Encounter for general adult medical examination without abnormal findings: Secondary | ICD-10-CM

## 2011-05-22 DIAGNOSIS — R7309 Other abnormal glucose: Secondary | ICD-10-CM

## 2011-05-22 MED ORDER — DULOXETINE HCL 30 MG PO CPEP: 30.0000 mg | ORAL_CAPSULE | Freq: Every day | ORAL | Status: DC

## 2011-05-22 MED ORDER — ESTRADIOL 2 MG PO TABS: ORAL_TABLET | ORAL | Status: DC

## 2011-05-22 MED ORDER — DULOXETINE HCL 60 MG PO CPEP: 60.0000 mg | ORAL_CAPSULE | Freq: Every day | ORAL | Status: DC

## 2011-05-22 NOTE — Assessment & Plan Note (Signed)
Mod to severe, most narcotic intolerant, has not tried diluadid, but follows with dr Margaretha Sheffield - Murphy/wainer, very young for knee replacements adn trying to hold off, for cymbalta asd today

## 2011-05-22 NOTE — Assessment & Plan Note (Signed)
stable overall by hx and exam, most recent data reviewed with pt, and pt to continue medical treatment as before  Lab Results  Component Value Date   HGBA1C 5.3 02/15/2009

## 2011-05-22 NOTE — Progress Notes (Signed)
Subjective:    Patient ID: [name], female    DOB: 05/26/1961, 50 y.o.   MRN: [id number]  HPI  Here for wellness and f/u;  Overall doing ok;  Pt denies CP, worsening SOB, DOE, wheezing, orthopnea, PND, worsening LE edema, palpitations, dizziness or syncope.  Pt denies neurological change such as new Headache, facial or extremity weakness.  Pt denies polydipsia, polyuria, or low sugar symptoms. Pt states overall good compliance with treatment and medications, good tolerability, and trying to follow lower cholesterol diet.  Pt denies worsening depressive symptoms but seems quite dysphoric today, but no suicidal ideation or panic. No fever, wt loss, night sweats, loss of appetite, or other constitutional symptoms.  Pt states good ability with ADL's, low fall risk, home safety reviewed and adequate, no significant changes in hearing or vision, and occasionally active with exercise, quite limited due to severe ongoing bilat knee pain for last 8 yrs, recurring LBP with known lumbar disc disease, and intolerance to most oral narcotic meds.   Past Medical History  Diagnosis Date  . Hyperplastic colon polyp     04/2010  . GERD (gastroesophageal reflux disease)   . Anemia   . Anxiety   . Depression   . Obesity   . Migraine   . DJD (degenerative joint disease)     knee  . IBS (irritable bowel syndrome)   . Menopause     early  . Impaired glucose tolerance 05/18/2011  . ANEMIA-IRON DEFICIENCY 01/03/2007  . ANXIETY 01/03/2007  . DEPRESSION 01/03/2007  . GERD 01/16/2009  . Headache 01/03/2007  . HYPERSOMNIA 04/04/2010  . IBS 01/16/2009  . 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 Wheezing 07/02/2010   Past Surgical History  Procedure Date  . Cholecystectomy   . Colon surgery   . Appendectomy   . Bladder tac   . Abdominal hysterectomy   . Knee arthroscopy   . Oophorectomy     reports that she has never smoked. She has never used smokeless tobacco. She reports that she does not drink  alcohol or use illicit drugs. family history includes Diabetes in her brother and Hypertension in her mother. Allergies  Allergen Reactions  . Amoxicillin     REACTION: upset gi  . Azithromycin   . Carafate     Blurry vision  . Codeine     REACTION: hot flashes, nausea  . Hydrocodone   . Morphine     REACTION: anapylaxis  . Omeprazole     REACTION: rash  . Oxycodone-Acetaminophen     REACTION: gi upset  . Penicillins     REACTION: hives, "sick"  . Tramadol    Current Outpatient Prescriptions on File Prior to Visit  Medication Sig Dispense Refill  . ondansetron (ZOFRAN) 4 MG tablet Take 4 mg by mouth every 8 (eight) hours as needed.        . ranitidine (ZANTAC) 300 MG tablet TAKE 1 TABLET BY MOUTH AT BEDTIME AND 1 TABLET EVERY MORNING  60 tablet  3   Review of Systems Review of Systems  Constitutional: Negative for diaphoresis and unexpected weight change.  HENT: Negative for drooling and tinnitus.   Eyes: Negative for photophobia and visual disturbance.  Respiratory: Negative for choking and stridor.   Gastrointestinal: Negative for vomiting and blood in stool.  Genitourinary: Negative for hematuria and decreased urine volume.  Musculoskeletal: Negative for gait problem.  Skin: Negative for color change and wound.  Neurological: Negative for tremors and numbness.  Psychiatric/Behavioral: Negative  for decreased concentration. The patient is not hyperactive.       Objective:   Physical Exam BP 122/78  Pulse 77  Temp(Src) 98.1 F (36.7 C) (Oral)  Ht 5\' 6"  (1.676 m)  Wt 244 lb (110.678 kg)  BMI 39.38 kg/m2  SpO2 97% Physical Exam  VS noted, obese Constitutional: Pt appears well-developed and well-nourished.  HENT: Head: Normocephalic.  Right Ear: External ear normal.  Left Ear: External ear normal.  Eyes: Conjunctivae and EOM are normal. Pupils are equal, round, and reactive to light.  Neck: Normal range of motion. Neck supple.  Cardiovascular: Normal rate and  regular rhythm.   Pulmonary/Chest: Effort normal and breath sounds normal.  Abd:  Soft, NT, non-distended, + BS Neurological: Pt is alert. No cranial nerve deficit. motor/sens/dtr intact. Skin: Skin is warm. No erythema Bilat knees with crepitus, no effusion Spine nontender.  Psychiatric: Pt behavior is normal. Thought content normal. + depressed affect, 2+ tense/anxious    Assessment & Plan:

## 2011-05-22 NOTE — Assessment & Plan Note (Addendum)

## 2011-05-22 NOTE — Assessment & Plan Note (Signed)
Moderate at least, for cymbalta 30 for 1 wk, then 60 mg after that, also for knee pain as well , cannot tolerate most narcotic (except parenteral demerol)

## 2011-05-22 NOTE — Assessment & Plan Note (Signed)
Ongoing, also for cymbalta asd, hold on additional meds at this time such as benzo

## 2011-05-22 NOTE — Patient Instructions (Signed)
Take all new medications as prescribed - the cymbalta - at 30 mg per day for one wk, then 60 mg per day after that Continue all other medications as before Please keep your appointments with your specialists as you have planned - Dr Jenna Luo Please return in 6 mo with Lab testing done 3-5 days before

## 2011-09-08 ENCOUNTER — Other Ambulatory Visit: Payer: Self-pay | Admitting: Gastroenterology

## 2012-01-09 ENCOUNTER — Other Ambulatory Visit: Payer: Self-pay | Admitting: Gastroenterology

## 2012-04-02 ENCOUNTER — Ambulatory Visit (INDEPENDENT_AMBULATORY_CARE_PROVIDER_SITE_OTHER): Payer: 59 | Admitting: General Practice

## 2012-04-02 DIAGNOSIS — Z23 Encounter for immunization: Secondary | ICD-10-CM

## 2012-04-27 LAB — HM MAMMOGRAPHY: HM Mammogram: NEGATIVE

## 2012-05-08 ENCOUNTER — Ambulatory Visit (INDEPENDENT_AMBULATORY_CARE_PROVIDER_SITE_OTHER): Payer: 59 | Admitting: Internal Medicine

## 2012-05-08 ENCOUNTER — Encounter: Payer: Self-pay | Admitting: Internal Medicine

## 2012-05-08 VITALS — BP 124/86 | HR 76 | Temp 97.3°F | Ht 66.0 in | Wt 237.0 lb

## 2012-05-08 DIAGNOSIS — R7309 Other abnormal glucose: Secondary | ICD-10-CM

## 2012-05-08 DIAGNOSIS — G8929 Other chronic pain: Secondary | ICD-10-CM | POA: Insufficient documentation

## 2012-05-08 DIAGNOSIS — M5380 Other specified dorsopathies, site unspecified: Secondary | ICD-10-CM

## 2012-05-08 DIAGNOSIS — Z0000 Encounter for general adult medical examination without abnormal findings: Secondary | ICD-10-CM

## 2012-05-08 DIAGNOSIS — M545 Low back pain, unspecified: Secondary | ICD-10-CM | POA: Insufficient documentation

## 2012-05-08 DIAGNOSIS — R7302 Impaired glucose tolerance (oral): Secondary | ICD-10-CM

## 2012-05-08 DIAGNOSIS — R079 Chest pain, unspecified: Secondary | ICD-10-CM | POA: Insufficient documentation

## 2012-05-08 DIAGNOSIS — M62830 Muscle spasm of back: Secondary | ICD-10-CM | POA: Insufficient documentation

## 2012-05-08 MED ORDER — CYCLOBENZAPRINE HCL 5 MG PO TABS: 5.0000 mg | ORAL_TABLET | Freq: Three times a day (TID) | ORAL | Status: DC | PRN

## 2012-05-08 NOTE — Patient Instructions (Addendum)
Take all new medications as prescribed Continue all other medications as before Please go to XRAY in the Basement for the x-ray test Please go to LAB in the Basement for the blood and/or urine tests to be done today, including the magnesium You will be contacted by phone if any changes need to be made immediately.  Otherwise, you will receive a letter about your results with an explanation, but please check with MyChart first. Please return for Nurse Visit for the shingle shot if covered by your insurance Thank you for enrolling in MyChart. Please follow the instructions below to securely access your online medical record. MyChart allows you to send messages to your doctor, view your test results, renew your prescriptions, schedule appointments, and more. To Log into MyChart, please go to [email or web address], and your Username is: [email or web address] You are otherwise up to date with prevention measures OK to cancel the Dec 24 appointment Please return in 1 year for your yearly visit, or sooner if needed, with Lab testing done 3-5 days before (the office should call)

## 2012-05-08 NOTE — Assessment & Plan Note (Addendum)
bilat knees need TKR, but also acute msk pain today - declines ecg, for cxr, and tylenol/flexeril prn,  to f/u any worsening symptoms or concerns

## 2012-05-08 NOTE — Progress Notes (Signed)
Subjective:    Patient ID: [name], female    DOB: 04/14/1961, 51 y.o.   MRN: [id number]  HPI  Here for wellness and f/u;  Overall doing ok;  Pt denies CP, worsening SOB, DOE, wheezing, orthopnea, PND, worsening LE edema, palpitations, dizziness or syncope, except for 2 wks recurring left periscapular pain that seems to radiate to the left anterior upper chest, no rash/red/swelling, after lifting heavy furniture recent.    Pt denies neurological change such as new Headache, facial or extremity weakness.  Pt denies polydipsia, polyuria, or low sugar symptoms. Pt states overall good compliance with treatment and medications, good tolerability, and trying to follow lower cholesterol diet.  Pt denies worsening depressive symptoms, suicidal ideation or panic. No fever, wt loss, night sweats, loss of appetite, or other constitutional symptoms.  Pt states good ability with ADL's, low fall risk, home safety reviewed and adequate, no significant changes in hearing or vision, and occasionally active with exercise.  Has had some leg cramps and asks about having mg checked.   Past Medical History  Diagnosis Date  . Hyperplastic colon polyp     04/2010  . GERD (gastroesophageal reflux disease)   . Anemia   . Anxiety   . Depression   . Obesity   . Migraine   . DJD (degenerative joint disease)     knee  . IBS (irritable bowel syndrome)   . Menopause     early  . Impaired glucose tolerance 05/18/2011  . ANEMIA-IRON DEFICIENCY 01/03/2007  . ANXIETY 01/03/2007  . DEPRESSION 01/03/2007  . GERD 01/16/2009  . Headache 01/03/2007  . HYPERSOMNIA 04/04/2010  . IBS 01/16/2009  . 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 Wheezing 07/02/2010   Past Surgical History  Procedure Date  . Cholecystectomy   . Colon surgery   . Appendectomy   . Bladder tac   . Abdominal hysterectomy   . Knee arthroscopy   . Oophorectomy     reports that she has never smoked. She has never used smokeless tobacco. She  reports that she does not drink alcohol or use illicit drugs. family history includes Diabetes in her brother and Hypertension in her mother. Allergies  Allergen Reactions  . Amoxicillin     REACTION: upset gi  . Azithromycin   . Codeine     REACTION: hot flashes, nausea  . Hydrocodone   . Morphine     REACTION: anapylaxis  . Omeprazole     REACTION: rash  . Oxycodone-Acetaminophen     REACTION: gi upset  . Penicillins     REACTION: hives, "sick"  . Sucralfate     Blurry vision  . Tramadol    Current Outpatient Prescriptions on File Prior to Visit  Medication Sig Dispense Refill  . estradiol (ESTRACE) 2 MG tablet Take 1 mg every morning and 2 mg every evening.  90 tablet  11  . ondansetron (ZOFRAN) 4 MG tablet Take 4 mg by mouth every 8 (eight) hours as needed.        . ranitidine (ZANTAC) 300 MG tablet TAKE 1 TABLET BY MOUTH AT BEDTIME AND 1 TABLET EVERY MORNING  60 tablet  3   Review of Systems Review of Systems  Constitutional: Negative for diaphoresis, activity change, appetite change and unexpected weight change.  HENT: Negative for hearing loss, ear pain, facial swelling, mouth sores and neck stiffness.   Eyes: Negative for pain, redness and visual disturbance.  Respiratory: Negative for shortness of breath and wheezing.  Cardiovascular: Negative for chest pain and palpitations.  Gastrointestinal: Negative for diarrhea, blood in stool, abdominal distention and rectal pain.  Genitourinary: Negative for hematuria, flank pain and decreased urine volume.  Musculoskeletal: Negative for myalgias and joint swelling.  Skin: Negative for color change and wound.  Neurological: Negative for syncope and numbness.  Hematological: Negative for adenopathy.  Psychiatric/Behavioral: Negative for hallucinations, self-injury, decreased concentration and agitation.      Objective:   Physical Exam BP 124/86  Pulse 76  Temp 97.3 F (36.3 C) (Oral)  Ht 5\' 6"  (1.676 m)  Wt 237 lb  (107.502 kg)  BMI 38.25 kg/m2  SpO2 97% Physical Exam  VS noted Constitutional: Pt is oriented to person, place, and time. Appears well-developed and well-nourished.  HENT:  Head: Normocephalic and atraumatic.  Right Ear: External ear normal.  Left Ear: External ear normal.  Nose: Nose normal.  Mouth/Throat: Oropharynx is clear and moist.  Eyes: Conjunctivae and EOM are normal. Pupils are equal, round, and reactive to light.  Neck: Normal range of motion. Neck supple. No JVD present. No tracheal deviation present.  Cardiovascular: Normal rate, regular rhythm, normal heart sounds and intact distal pulses.   Pulmonary/Chest: Effort normal and breath sounds normal.  Abdominal: Soft. Bowel sounds are normal. There is no tenderness.  Musculoskeletal: Normal range of motion. Exhibits no edema.  Lymphadenopathy:  Has no cervical adenopathy.  Neurological: Pt is alert and oriented to person, place, and time. Pt has normal reflexes. No cranial nerve deficit.  Skin: Skin is warm and dry. No rash noted.  Psychiatric:  Has  normal mood and affect. Behavior is normal.  Has tenderness to reproduce pain to the left periscapular area, as well as the left mid parasternal area, no swelling/rash       Assessment & Plan:

## 2012-05-10 ENCOUNTER — Encounter: Payer: Self-pay | Admitting: Internal Medicine

## 2012-05-10 NOTE — Assessment & Plan Note (Addendum)
Mild, likely related to recent heavy lifting, for tylenol prn,  to f/u any worsening symptoms or concerns, for flexeril prn

## 2012-05-10 NOTE — Assessment & Plan Note (Addendum)
C/w costochondritis likely related to recent heavy lifting, declines ecg, for tylenol prn,  to f/u any worsening symptoms or concerns, for cxr - pt is ok with this

## 2012-05-10 NOTE — Assessment & Plan Note (Signed)

## 2012-05-10 NOTE — Assessment & Plan Note (Signed)
stable overall by hx and exam, most recent data reviewed with pt, and pt to continue medical treatment as before  Lab Results  Component Value Date   HGBA1C 5.3 02/15/2009    

## 2012-05-23 ENCOUNTER — Other Ambulatory Visit: Payer: Self-pay | Admitting: Gastroenterology

## 2012-06-16 ENCOUNTER — Encounter: Payer: 59 | Admitting: Internal Medicine

## 2012-07-08 ENCOUNTER — Other Ambulatory Visit: Payer: Self-pay | Admitting: Gastroenterology

## 2012-09-28 ENCOUNTER — Other Ambulatory Visit: Payer: Self-pay | Admitting: Gastroenterology

## 2012-12-16 DIAGNOSIS — K30 Functional dyspepsia: Secondary | ICD-10-CM | POA: Insufficient documentation

## 2013-01-25 ENCOUNTER — Other Ambulatory Visit: Payer: Self-pay | Admitting: Gastroenterology

## 2013-02-07 ENCOUNTER — Other Ambulatory Visit: Payer: Self-pay | Admitting: Gastroenterology

## 2013-03-13 ENCOUNTER — Emergency Department (HOSPITAL_COMMUNITY)
Admission: EM | Admit: 2013-03-13 | Discharge: 2013-03-13 | Disposition: A | Discharge status: Home / Self Care | Payer: 59 | Attending: Emergency Medicine | Admitting: Emergency Medicine

## 2013-03-13 ENCOUNTER — Emergency Department (HOSPITAL_COMMUNITY): Payer: 59

## 2013-03-13 ENCOUNTER — Encounter (HOSPITAL_COMMUNITY): Payer: Self-pay | Admitting: Adult Health

## 2013-03-13 ENCOUNTER — Ambulatory Visit (INDEPENDENT_AMBULATORY_CARE_PROVIDER_SITE_OTHER): Payer: 59 | Admitting: Family Medicine

## 2013-03-13 VITALS — BP 132/84 | HR 102 | Temp 98.7°F | Resp 17 | Ht 65.5 in | Wt 235.0 lb

## 2013-03-13 DIAGNOSIS — Z8679 Personal history of other diseases of the circulatory system: Secondary | ICD-10-CM | POA: Insufficient documentation

## 2013-03-13 DIAGNOSIS — Z862 Personal history of diseases of the blood and blood-forming organs and certain disorders involving the immune mechanism: Secondary | ICD-10-CM | POA: Insufficient documentation

## 2013-03-13 DIAGNOSIS — Z8739 Personal history of other diseases of the musculoskeletal system and connective tissue: Secondary | ICD-10-CM | POA: Insufficient documentation

## 2013-03-13 DIAGNOSIS — S0993XA Unspecified injury of face, initial encounter: Secondary | ICD-10-CM

## 2013-03-13 DIAGNOSIS — Z8659 Personal history of other mental and behavioral disorders: Secondary | ICD-10-CM | POA: Insufficient documentation

## 2013-03-13 DIAGNOSIS — Z860100 Personal history of colon polyps, unspecified: Secondary | ICD-10-CM | POA: Insufficient documentation

## 2013-03-13 DIAGNOSIS — E669 Obesity, unspecified: Secondary | ICD-10-CM | POA: Insufficient documentation

## 2013-03-13 DIAGNOSIS — K219 Gastro-esophageal reflux disease without esophagitis: Secondary | ICD-10-CM | POA: Insufficient documentation

## 2013-03-13 DIAGNOSIS — Z880 Allergy status to penicillin: Secondary | ICD-10-CM | POA: Insufficient documentation

## 2013-03-13 DIAGNOSIS — S022XXA Fracture of nasal bones, initial encounter for closed fracture: Secondary | ICD-10-CM

## 2013-03-13 DIAGNOSIS — K589 Irritable bowel syndrome without diarrhea: Secondary | ICD-10-CM | POA: Insufficient documentation

## 2013-03-13 DIAGNOSIS — Z79899 Other long term (current) drug therapy: Secondary | ICD-10-CM | POA: Insufficient documentation

## 2013-03-13 MED ORDER — PROMETHAZINE HCL 25 MG PO TABS
25.0000 mg | ORAL_TABLET | ORAL | Status: AC
  Administered 2013-03-13: 25 mg via ORAL
  Filled 2013-03-13: qty 1

## 2013-03-13 MED ORDER — HYDROCODONE-ACETAMINOPHEN 5-325 MG PO TABS
2.0000 | ORAL_TABLET | Freq: Once | ORAL | Status: DC
  Filled 2013-03-13: qty 2

## 2013-03-13 MED ORDER — ACETAMINOPHEN 500 MG PO TABS
1000.0000 mg | ORAL_TABLET | Freq: Once | ORAL | Status: AC
  Administered 2013-03-13: 1000 mg via ORAL
  Filled 2013-03-13: qty 2

## 2013-03-13 MED ORDER — PROMETHAZINE HCL 25 MG PO TABS: 25.0000 mg | ORAL_TABLET | Freq: Four times a day (QID) | ORAL | Status: DC | PRN
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
## 2013-03-13 NOTE — Patient Instructions (Addendum)
Go directly to the ER - you need to have imaging to evaluation for fracture that we cannot do here.  We have informed the ER of your arrival.
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
## 2013-03-15 NOTE — ED Provider Notes (Signed)
Medical screening examination/treatment/procedure(s) were performed by non-physician practitioner and as supervising physician I was immediately available for consultation/collaboration.   Charles B. Bernette Mayers, MD 03/15/13 1758

## 2013-03-19 ENCOUNTER — Ambulatory Visit (INDEPENDENT_AMBULATORY_CARE_PROVIDER_SITE_OTHER): Payer: 59

## 2013-03-19 DIAGNOSIS — Z23 Encounter for immunization: Secondary | ICD-10-CM

## 2013-05-10 ENCOUNTER — Encounter: Payer: 59 | Admitting: Internal Medicine

## 2013-05-11 ENCOUNTER — Other Ambulatory Visit (INDEPENDENT_AMBULATORY_CARE_PROVIDER_SITE_OTHER): Payer: 59

## 2013-05-11 ENCOUNTER — Ambulatory Visit (INDEPENDENT_AMBULATORY_CARE_PROVIDER_SITE_OTHER): Payer: 59 | Admitting: Internal Medicine

## 2013-05-11 ENCOUNTER — Encounter: Payer: Self-pay | Admitting: Internal Medicine

## 2013-05-11 VITALS — BP 140/88 | HR 70 | Temp 97.6°F | Ht 66.0 in | Wt 235.5 lb

## 2013-05-11 DIAGNOSIS — R7302 Impaired glucose tolerance (oral): Secondary | ICD-10-CM

## 2013-05-11 DIAGNOSIS — R7309 Other abnormal glucose: Secondary | ICD-10-CM

## 2013-05-11 DIAGNOSIS — Z0000 Encounter for general adult medical examination without abnormal findings: Secondary | ICD-10-CM

## 2013-05-11 LAB — URINALYSIS, ROUTINE W REFLEX MICROSCOPIC
Bilirubin Urine: NEGATIVE
Ketones, ur: NEGATIVE
Nitrite: NEGATIVE
Total Protein, Urine: NEGATIVE
pH: 6 (ref 5.0–8.0)

## 2013-05-11 LAB — CBC WITH DIFFERENTIAL/PLATELET
Basophils Absolute: 0 10*3/uL (ref 0.0–0.1)
Basophils Relative: 0.6 % (ref 0.0–3.0)
Eosinophils Absolute: 0.2 10*3/uL (ref 0.0–0.7)
Lymphocytes Relative: 38.1 % (ref 12.0–46.0)
MCHC: 34.2 g/dL (ref 30.0–36.0)
Neutrophils Relative %: 50.8 % (ref 43.0–77.0)
RBC: 4.7 Mil/uL (ref 3.87–5.11)
RDW: 12.8 % (ref 11.5–14.6)
WBC: 6.7 10*3/uL (ref 4.5–10.5)

## 2013-05-11 LAB — BASIC METABOLIC PANEL
BUN: 13 mg/dL (ref 6–23)
CO2: 29 mEq/L (ref 19–32)
Chloride: 104 mEq/L (ref 96–112)
Creatinine, Ser: 0.9 mg/dL (ref 0.4–1.2)
Glucose, Bld: 98 mg/dL (ref 70–99)

## 2013-05-11 LAB — HEPATIC FUNCTION PANEL
Albumin: 3.8 g/dL (ref 3.5–5.2)
Alkaline Phosphatase: 41 U/L (ref 39–117)
Bilirubin, Direct: 0.1 mg/dL (ref 0.0–0.3)
Total Bilirubin: 0.5 mg/dL (ref 0.3–1.2)
Total Protein: 7.3 g/dL (ref 6.0–8.3)

## 2013-05-11 LAB — LIPID PANEL
Total CHOL/HDL Ratio: 3
Triglycerides: 224 mg/dL — ABNORMAL HIGH (ref 0.0–149.0)

## 2013-05-11 LAB — LDL CHOLESTEROL, DIRECT: Direct LDL: 83.6 mg/dL

## 2013-05-11 NOTE — Patient Instructions (Signed)
Please continue all other medications as before, and refills have been done if requested. Please have the pharmacy call with any other refills you may need. Please continue your efforts at being more active, low cholesterol diet, and weight control. You are otherwise up to date with prevention measures today. You may want to consider the Gastric Sleeve procedure to help with wt loss Please keep your appointments with your specialists as you have planned  You had the labs drawn this morning You will be contacted by phone if any changes need to be made immediately.  Otherwise, you will receive a letter about your results with an explanation, but please check with MyChart first.  Please remember to sign up for My Chart if you have not done so, as this will be important to you in the future with finding out test results, communicating by private email, and scheduling acute appointments online when needed.  Please return in 1 year for your yearly visit, or sooner if needed, with Lab testing done 3-5 days before

## 2013-05-11 NOTE — Progress Notes (Signed)
Pre-visit discussion using our clinic review tool. No additional management support is needed unless otherwise documented below in the visit note.  

## 2013-05-11 NOTE — Assessment & Plan Note (Signed)
Asympt,  Lab Results  Component Value Date   HGBA1C 5.3 02/15/2009   stable overall by history and exam, recent data reviewed with pt, and pt to continue medical treatment as before,  to f/u any worsening symptoms or concerns

## 2013-05-11 NOTE — Assessment & Plan Note (Signed)
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
## 2013-09-09 ENCOUNTER — Other Ambulatory Visit (HOSPITAL_COMMUNITY): Payer: Self-pay | Admitting: Obstetrics and Gynecology

## 2013-09-09 DIAGNOSIS — R1033 Periumbilical pain: Secondary | ICD-10-CM

## 2013-09-10 ENCOUNTER — Encounter (HOSPITAL_COMMUNITY): Payer: Self-pay

## 2013-09-10 ENCOUNTER — Other Ambulatory Visit (HOSPITAL_COMMUNITY): Payer: Self-pay | Admitting: Obstetrics and Gynecology

## 2013-09-10 ENCOUNTER — Ambulatory Visit (HOSPITAL_COMMUNITY)
Admission: RE | Admit: 2013-09-10 | Discharge: 2013-09-10 | Disposition: A | Discharge status: Home / Self Care | Payer: 59 | Source: Ambulatory Visit | Attending: Obstetrics and Gynecology | Admitting: Obstetrics and Gynecology

## 2013-09-10 DIAGNOSIS — K449 Diaphragmatic hernia without obstruction or gangrene: Secondary | ICD-10-CM | POA: Insufficient documentation

## 2013-09-10 DIAGNOSIS — Q762 Congenital spondylolisthesis: Secondary | ICD-10-CM | POA: Insufficient documentation

## 2013-09-10 DIAGNOSIS — M5137 Other intervertebral disc degeneration, lumbosacral region: Secondary | ICD-10-CM | POA: Insufficient documentation

## 2013-09-10 DIAGNOSIS — M51379 Other intervertebral disc degeneration, lumbosacral region without mention of lumbar back pain or lower extremity pain: Secondary | ICD-10-CM | POA: Insufficient documentation

## 2013-09-10 DIAGNOSIS — R1033 Periumbilical pain: Secondary | ICD-10-CM

## 2013-09-10 DIAGNOSIS — R160 Hepatomegaly, not elsewhere classified: Secondary | ICD-10-CM | POA: Insufficient documentation

## 2013-09-10 DIAGNOSIS — Z90710 Acquired absence of both cervix and uterus: Secondary | ICD-10-CM | POA: Insufficient documentation

## 2013-09-10 DIAGNOSIS — Z9089 Acquired absence of other organs: Secondary | ICD-10-CM | POA: Insufficient documentation

## 2013-09-10 MED ORDER — IOHEXOL 300 MG/ML  SOLN: 100.0000 mL | Freq: Once | INTRAMUSCULAR | Status: DC | PRN

## 2013-10-05 ENCOUNTER — Other Ambulatory Visit: Payer: Self-pay | Admitting: Gastroenterology

## 2013-10-05 DIAGNOSIS — R160 Hepatomegaly, not elsewhere classified: Secondary | ICD-10-CM

## 2013-10-08 ENCOUNTER — Ambulatory Visit
Admission: RE | Admit: 2013-10-08 | Discharge: 2013-10-08 | Disposition: A | Discharge status: Home / Self Care | Payer: 59 | Source: Ambulatory Visit | Attending: Gastroenterology | Admitting: Gastroenterology

## 2013-10-08 DIAGNOSIS — R160 Hepatomegaly, not elsewhere classified: Secondary | ICD-10-CM

## 2014-01-27 ENCOUNTER — Other Ambulatory Visit: Payer: Self-pay | Admitting: Obstetrics and Gynecology

## 2014-01-28 LAB — CYTOLOGY - PAP

## 2014-03-15 ENCOUNTER — Ambulatory Visit: Payer: 59

## 2014-03-22 ENCOUNTER — Ambulatory Visit: Payer: 59

## 2014-04-01 ENCOUNTER — Ambulatory Visit (INDEPENDENT_AMBULATORY_CARE_PROVIDER_SITE_OTHER): Payer: 59

## 2014-04-01 DIAGNOSIS — Z23 Encounter for immunization: Secondary | ICD-10-CM

## 2014-08-02 ENCOUNTER — Telehealth: Payer: Self-pay | Admitting: Internal Medicine

## 2014-08-03 ENCOUNTER — Telehealth: Payer: Self-pay | Admitting: Internal Medicine

## 2014-08-03 NOTE — Telephone Encounter (Signed)
Pt is wanting orders for blood work put in by Friday, she said that she dropped a note off yesterday with additional blood work to be put in.  You can call her on her cell number

## 2014-08-04 NOTE — Telephone Encounter (Signed)
Per Dr Jonny RuizJohn he does not feel that the labs requested are needed and does not feel comfortable ordering them.  Left message for pt to call back

## 2014-08-05 NOTE — Telephone Encounter (Signed)
Pt aware, she will discuss this at her 2/24 appt

## 2014-08-17 ENCOUNTER — Encounter: Payer: 59 | Admitting: Internal Medicine

## 2014-10-26 ENCOUNTER — Other Ambulatory Visit (HOSPITAL_COMMUNITY): Payer: Self-pay | Admitting: Dermatology

## 2014-12-07 ENCOUNTER — Encounter: Payer: Self-pay | Admitting: Physical Therapy

## 2014-12-07 ENCOUNTER — Ambulatory Visit: Payer: 59 | Attending: Obstetrics and Gynecology | Admitting: Physical Therapy

## 2014-12-07 DIAGNOSIS — R350 Frequency of micturition: Secondary | ICD-10-CM | POA: Insufficient documentation

## 2014-12-07 DIAGNOSIS — M6289 Other specified disorders of muscle: Secondary | ICD-10-CM

## 2014-12-07 DIAGNOSIS — N8184 Pelvic muscle wasting: Secondary | ICD-10-CM | POA: Diagnosis not present

## 2014-12-07 NOTE — Therapy (Signed)
[facility] [address] [place], [place], [address] Phone: [phone]   Fax:  [phone]  Physical Therapy Evaluation  Patient Details  Name: [name] MRN: [id number] Date of Birth: Sep 25, 1960 Referring Provider:  Richarda Overlie, MD  Encounter Date: 12/07/2014      PT End of Session - 12/07/14 1643    Visit Number 1   Date for PT Re-Evaluation 03/01/15   PT Start Time 1620   PT Stop Time 1700   PT Time Calculation (min) 40 min   Activity Tolerance Patient tolerated treatment well   Behavior During Therapy [facility] for tasks assessed/performed      Past Medical History  Diagnosis Date  . Hyperplastic colon polyp     04/2010  . GERD (gastroesophageal reflux disease)   . Anemia   . Anxiety   . Depression   . Obesity   . Migraine   . DJD (degenerative joint disease)     knee  . IBS (irritable bowel syndrome)   . Menopause     early  . Impaired glucose tolerance 05/18/2011  . ANEMIA-IRON DEFICIENCY 01/03/2007  . ANXIETY 01/03/2007  . DEPRESSION 01/03/2007  . GERD 01/16/2009  . Headache(784.0) 01/03/2007  . HYPERSOMNIA 04/04/2010  . IBS 01/16/2009  . 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 Wheezing 07/02/2010    Past Surgical History  Procedure Laterality Date  . Cholecystectomy    . Appendectomy    . Bladder tac    . Abdominal hysterectomy    . Knee arthroscopy    . Oophorectomy      There were no vitals filed for this visit.  Visit Diagnosis:  Pelvic floor dysfunction - Plan: PT plan of care cert/re-cert      Subjective Assessment - 12/07/14 1626    Subjective Patient reports she has to urinate often.  Patient reports if she hold her urine too long she has spasms with pain. Patient reports this has been going on for 3 years.    Patient Stated Goals reduce the amount she has urinated   Currently in Pain? Yes   Pain Score 6    Pain Location Bladder   Pain Orientation Anterior    Pain Descriptors / Indicators Spasm   Pain Type Chronic pain   Pain Onset More than a month ago   Pain Frequency Intermittent   Aggravating Factors  holding her urine too long   Pain Relieving Factors urinating    Multiple Pain Sites No            [facility] PT Assessment - 12/07/14 0001    Assessment   Medical Diagnosis Pelvic floor issues   Onset Date/Surgical Date 06/25/11   Prior Therapy None   Precautions   Precautions None   Balance Screen   Has the patient fallen in the past 6 months No   Has the patient had a decrease in activity level because of a fear of falling?  No   Is the patient reluctant to leave their home because of a fear of falling?  No   Prior Function   Level of Independence Independent   Leisure pool aerobics   Observation/Other Assessments   Skin Integrity c-section scar has decreased mobility   Focus on Therapeutic Outcomes (FOTO)  51% limitation for urinary symptoms   ROM / Strength   AROM / PROM / Strength Strength   Strength   Overall Strength Comments right hip extension 4/5; abdominal strength 2/5.  Palpation   Palpation comment left ilium is posteriorly rotated; tightness with urethra; decreased mobility of scar                 Pelvic Floor Special Questions - 12/07/14 0001    Prior Pregnancies Yes   Number of Pregnancies 2   Number of C-Sections 2   Diastasis Recti none   Marinoff Scale no problems   Urinary Leakage Yes  when has the strong urge once in awhile   Pad use no   Urinary urgency Yes   Urinary frequency every 1 1/2 hours  more when drinks cokes   Caffeine beverages 1 per day   Pelvic Floor Internal Exam Patient confirms identification and approves physical therapist to assess pelvic muscle strength and integrity   Exam Type Vaginal   Palpation circular contraction, when coughs will bear down   Strength weak squeeze, no lift   Strength # of reps 3  1 quick flick   Strength # of seconds 5                   PT Education - 12/07/14 1657    Education provided Yes   Education Details pelvic floor contraction; bladder irritants   Person(s) Educated Patient   Methods Explanation;Demonstration;Tactile cues;Verbal cues;Handout   Comprehension Returned demonstration;Verbalized understanding          PT Short Term Goals - 12/07/14 1708    PT SHORT TERM GOAL #1   Title urge to void was educated on    Time 4   Period Weeks   Status New   PT SHORT TERM GOAL #2   Title understand what bladder irritants are and how they affect the bladder   Time 4   Period Weeks   Status New   PT SHORT TERM GOAL #3   Title ability to contract the pelvic floor with a lift   Time 4   Period Weeks   Status New           PT Long Term Goals - 12/07/14 1709    PT LONG TERM GOAL #1   Title ability to exercise in the pool for 2 hours without having to get out to urinate   Time 12   Period Weeks   Status New   PT LONG TERM GOAL #2   Title ability to hold her urine 5 min when she has the urge to void   Time 12   Period Weeks   Status New   PT LONG TERM GOAL #3   Title pelvic floor strength >/= 3/5 holding for 10 seconds   Time 12   Period Weeks   Status New   PT LONG TERM GOAL #4   Title wait three hours before she has to urinate due to increased strength   Time 12   Period Weeks   Status New   PT LONG TERM GOAL #5   Title independent with HEP and understands how to progress herself   Time 12   Period Weeks   Status New               Plan - 12/07/14 1701    Clinical Impression Statement Patient is a 54 year old female with diagnosis of pelvic floor issues.  Patient presents with frequent urination and pain if she holds it too much at level 6/10.  Patient has to leave the pool 3 times for a 2 hour workout. Patient will have to urinate every  1 1/2 hour  and when she has the urge to urnate she is unable to wait.  Patient pelvic floor strength is 2/5 holding for 5 sec for 3 times and only  able to do 1 quick flick. Abdominal strength is 2/5.  Decreased mobility of s-section scar and urethra.  FOTO score is 51% limitation. Patient would benefit from pysical therapy to strengthen pelvic floor, educate on urge to void to hold her urine longer, strengthening of core, and scar massage.    Pt will benefit from skilled therapeutic intervention in order to improve on the following deficits Decreased activity tolerance;Decreased strength;Decreased scar mobility;Pain;Increased muscle spasms;Decreased endurance;Increased fascial restricitons   Rehab Potential Excellent   Clinical Impairments Affecting Rehab Potential None   PT Frequency 1x / week   PT Duration 12 weeks   PT Treatment/Interventions ADLs/Self Care Home Management;Cryotherapy;Electrical Stimulation;Biofeedback;Ultrasound;Moist Heat;Therapeutic activities;Therapeutic exercise;Neuromuscular re-education;Manual techniques;Patient/family education;Scar mobilization;Passive range of motion   PT Next Visit Plan bladder diary, urge to void, abdominal strengthening, hip strengthening, review bladder irritants; possible sensor   PT Home Exercise Plan abdominal strength; bladder diary   Recommended Other Services None   Consulted and Agree with Plan of Care Patient         Problem List Patient Active Problem List   Diagnosis Date Noted  . Chronic pain 05/08/2012  . Muscle spasm of back 05/08/2012  . Chest pain 05/08/2012  . Gastroparesis 05/22/2011  . Impaired glucose tolerance 05/18/2011  . Preventative health care 05/18/2011  . HYPERSOMNIA 04/04/2010  . GERD 01/16/2009  . IBS 01/16/2009  . 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 ANEMIA-IRON DEFICIENCY 01/03/2007  . ANXIETY 01/03/2007  . DEPRESSION 01/03/2007    GRAY,CHERYL,PT 12/07/2014, 5:13 PM   [facility] [address] [place], [place], [address] Phone: [phone]   Fax:  [phone]

## 2014-12-07 NOTE — Patient Instructions (Signed)
Adduction: Hip - Knees Together (Hook-Lying)   Lie with hips and knees bent, towel roll between knees. Push knees together. Hold for _5__ seconds. Rest for _5__ seconds. Repeat _5__ times. Do _4__ times a day.   Copyright  VHI. All rights reserved.  Quick Contraction: Gravity Eliminated (Hook-Lying)   Lie with hips and knees bent. Quickly squeeze then fully relax pelvic floor. Perform __1_ sets of _3__. Rest for _1__ seconds between sets. Do __4_ times a day.   Copyright  VHI. All rights reserved.  Certain foods and liquids will decrease the pH making the urine more acidic.  Urinary urgency increases when the urine has a low pH.  Most common irritants: alcohol, carbonated beverages and caffinated beverages.  Foods to avoid: apple juice, apples, ascorbic acid, canteloupes, chili, citrus fruits, coffee, cranberries, grapes, guava, peaches, pepper, pineapple, plums, strawberries, tea, tomatoes, and vinegar.  Drinking plenty of water may help to increase the pH and dilute out any of the effects of specific irritants.  Foods that are NOT irritating to the bladder include: Pears, papayas, sun-brewed teas, watermelons, non-citrus herbal teas, apricots, kava and low-acid instant drinks (Postum) [facility] [address] [place], [place] [address] Phone # [phone] Fax [phone]

## 2014-12-14 ENCOUNTER — Ambulatory Visit: Payer: 59 | Admitting: Physical Therapy

## 2014-12-16 ENCOUNTER — Ambulatory Visit: Payer: 59 | Admitting: Physical Therapy

## 2014-12-16 ENCOUNTER — Encounter: Payer: Self-pay | Admitting: Physical Therapy

## 2014-12-16 DIAGNOSIS — N8184 Pelvic muscle wasting: Secondary | ICD-10-CM | POA: Diagnosis not present

## 2014-12-16 DIAGNOSIS — M6289 Other specified disorders of muscle: Secondary | ICD-10-CM

## 2014-12-16 NOTE — Patient Instructions (Addendum)
Certain foods and liquids will decrease the pH making the urine more acidic.  Urinary urgency increases when the urine has a low pH.  Most common irritants: alcohol, carbonated beverages and caffinated beverages.  Foods to avoid: apple juice, apples, ascorbic acid, canteloupes, chili, citrus fruits, coffee, cranberries, grapes, guava, peaches, pepper, pineapple, plums, strawberries, tea, tomatoes, and vinegar.  Drinking plenty of water may help to increase the pH and dilute out any of the effects of specific irritants.  Foods that are NOT irritating to the bladder include: Pears, papayas, sun-brewed teas, watermelons, non-citrus herbal teas, apricots, kava and low-acid instant drinks (Postum)  Diaphragmatic Breathing - Supine   Hands on top of navel, lips closed, breathe in through nose filling stomach with air, navel moves out toward hands. Hold 3 sec. Exhale through puckered lips, hands follow exhalation in. Repeat _10__ times. Rest _1__ seconds between repeats. Do __2 times per day [facility] [address] [place], [place] [address] Phone # [phone] Fax [phone] _  Copyright  VHI. All rights reserved.

## 2014-12-16 NOTE — Therapy (Signed)
[facility] [address], [place], [place], [address] Phone: [phone]   Fax:  [phone]  Physical Therapy Treatment  Patient Details  Name: [name] MRN: [id number] Date of Birth: 07-09-1960 Referring Provider:  Molli Posey, MD  Encounter Date: 12/16/2014      PT End of Session - 12/16/14 0806    Visit Number 2   Date for PT Re-Evaluation 03/01/15   PT Start Time 0800   PT Stop Time 0845   PT Time Calculation (min) 45 min   Activity Tolerance Patient tolerated treatment well   Behavior During Therapy [facility] for tasks assessed/performed      Past Medical History  Diagnosis Date  . Hyperplastic colon polyp     04/2010  . GERD (gastroesophageal reflux disease)   . Anemia   . Anxiety   . Depression   . Obesity   . Migraine   . DJD (degenerative joint disease)     knee  . IBS (irritable bowel syndrome)   . Menopause     early  . Impaired glucose tolerance 05/18/2011  . ANEMIA-IRON DEFICIENCY 01/03/2007  . ANXIETY 01/03/2007  . DEPRESSION 01/03/2007  . GERD 01/16/2009  . Headache(784.0) 01/03/2007  . HYPERSOMNIA 04/04/2010  . IBS 01/16/2009  . 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 Wheezing 07/02/2010    Past Surgical History  Procedure Laterality Date  . Cholecystectomy    . Appendectomy    . Bladder tac    . Abdominal hysterectomy    . Knee arthroscopy    . Oophorectomy      There were no vitals filed for this visit.  Visit Diagnosis:  Pelvic floor dysfunction      Subjective Assessment - 12/16/14 0804    Subjective I feel fine from last visit. When I bring my knees up in the water I get the sensation of having to urinate.    Patient Stated Goals reduce the amount she has urinated   Currently in Pain? No/denies                      Pelvic Floor Special Questions - 12/16/14 0001    Pelvic Floor Internal Exam Patient confirms identification and approves  physical therapist to assess pelvic muscle strength and integrity   Exam Type Vaginal   Palpation circular contraction, when coughs will bear down   Strength weak squeeze, no lift           [facility] Adult PT Treatment/Exercise - 12/16/14 0001    Lumbar Exercises: Supine   Other Supine Lumbar Exercises diaphragmatic breathing   Other Supine Lumbar Exercises pelvic floor contraction with tactile cues to lower abdominal hold 5 seconds 10 times   Manual Therapy   Manual Therapy Myofascial release;Soft tissue mobilization   Soft tissue mobilization to diaphragm, abdominals, lower intestines   Myofascial Release lower intestines and around urethra                PT Education - 12/16/14 0811    Education provided Yes   Education Details bladder diary, bladder irritants; diaphragmatic breathing   Person(s) Educated Patient   Methods Explanation;Tactile cues;Verbal cues;Handout   Comprehension Returned demonstration;Verbalized understanding          PT Short Term Goals - 12/16/14 0805    PT SHORT TERM GOAL #1   Title urge to void was educated on    Time 4   Period Weeks   Status On-going  still learning   PT SHORT TERM GOAL #2   Title understand what bladder irritants are and how they affect the bladder   Time 4   Period Weeks   Status Achieved   PT SHORT TERM GOAL #3   Title ability to contract the pelvic floor with a lift   Time 4   Period Weeks   Status On-going  still learning           PT Long Term Goals - 12/07/14 1709    PT LONG TERM GOAL #1   Title ability to exercise in the pool for 2 hours without having to get out to urinate   Time 12   Period Weeks   Status New   PT LONG TERM GOAL #2   Title ability to hold her urine 5 min when she has the urge to void   Time 12   Period Weeks   Status New   PT LONG TERM GOAL #3   Title pelvic floor strength >/= 3/5 holding for 10 seconds   Time 12   Period Weeks   Status New   PT LONG TERM GOAL #4    Title wait three hours before she has to urinate due to increased strength   Time 12   Period Weeks   Status New   PT LONG TERM GOAL #5   Title independent with HEP and understands how to progress herself   Time 12   Period Weeks   Status New               Plan - 12/16/14 0846    Clinical Impression Statement Patient understands what bladder irritants are.  Patient is able to slightly lift the pelvic floor with contraction if she has tactile cues to lower abdominals and taping to pelvic floor muscles. Patient has increased tightness in lower abdominals, diaphram and around left obturator internist.  Patient has trigger point that does not resolve in left lower abdominal area. Patient has met STG #1. Patient was given bladder diary to undersand the amount she is urinating. Pelvic floor strength 2/5.    Pt will benefit from skilled therapeutic intervention in order to improve on the following deficits Decreased activity tolerance;Decreased strength;Decreased scar mobility;Pain;Increased muscle spasms;Decreased endurance;Increased fascial restricitons   Rehab Potential Excellent   Clinical Impairments Affecting Rehab Potential None   PT Frequency 1x / week   PT Duration 12 weeks   PT Treatment/Interventions ADLs/Self Care Home Management;Cryotherapy;Electrical Stimulation;Biofeedback;Ultrasound;Moist Heat;Therapeutic activities;Therapeutic exercise;Neuromuscular re-education;Manual techniques;Patient/family education;Scar mobilization;Passive range of motion   PT Next Visit Plan urge to void, soft tissue work, abdominal strengthening, go over bladder diary   PT Home Exercise Plan abdominal strength; urge to void   Consulted and Agree with Plan of Care Patient        Problem List Patient Active Problem List   Diagnosis Date Noted  . Chronic pain 05/08/2012  . Muscle spasm of back 05/08/2012  . Chest pain 05/08/2012  . Gastroparesis 05/22/2011  . Impaired glucose tolerance  05/18/2011  . Preventative health care 05/18/2011  . HYPERSOMNIA 04/04/2010  . GERD 01/16/2009  . IBS 01/16/2009  . 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 ANEMIA-IRON DEFICIENCY 01/03/2007  . ANXIETY 01/03/2007  . DEPRESSION 01/03/2007    Domanique Huesman,PT 12/16/2014, 8:51 AM  [facility] [address], [place], [place], [address] Phone: [phone]   Fax:  [phone]

## 2014-12-21 ENCOUNTER — Encounter: Payer: Self-pay | Admitting: Physical Therapy

## 2014-12-21 ENCOUNTER — Ambulatory Visit: Payer: 59 | Admitting: Physical Therapy

## 2014-12-21 DIAGNOSIS — M6289 Other specified disorders of muscle: Secondary | ICD-10-CM

## 2014-12-21 DIAGNOSIS — N8184 Pelvic muscle wasting: Secondary | ICD-10-CM | POA: Diagnosis not present

## 2014-12-21 NOTE — Patient Instructions (Addendum)
Void every 1 1/2 hour but if you do not have the urge do not go to the bathroom.   No drinking water after 8 Lower abdominal/core stability exercises  1. Practice your breathing technique: Inhale through your nose expanding your belly and rib cage. Try not to breathe into your chest. Exhale slowly and gradually out your mouth feeling a sense of softness to your body. Practice multiple times. This can be performed unlimited.  2. Finding the lower abdominals. Laying on your back with the knees bent, place your fingers just below your belly button. Using your breathing technique from above, on your exhale gently pull the belly button away from your fingertips without tensing any other muscles. Practice this 5x. Next, as you exhale, draw belly button inwards and hold onto it...then feel as if you are pulling that muscle across your pelvis like you are tightening a belt. This can be hard to do at first so be patient and practice. Do 5-10 reps 1-3 x day. Always recognize quality over quantity; if your abdominal muscles become tired you will notice you may tighten/contract other muscles. This is the time to take a break.   Practice this first laying on your back, then in sitting, progressing to standing and finally adding it to all your daily movements.   3. Finding your pelvic floor. Using the breathing technique above, when your exhale, this time draw your pelvic floor muscles up as if you were attempting to stop the flow of urination. Be careful NOT to tense any other muscles. This can be hard, BE PATIENT. Try to hold up to 10 seconds repeating 10x. Try 2x a day. Once you feel you are doing this well, add this contraction to exercise #2. First contracting your pelvic floor followed by lower abdominals.   4. Adding leg movements. Add the following leg movements to challenge your ability to keep your core stable:  1. Single leg drop outs: Laying on your back with knees bent feet flat. Inhale,  dropping one  knee outward KEEPING YOUR PELVIS STILL. Exhale as you bring the leg back, simultaneously performing your lower abdominal contraction. Do 5-10 on each leg.   2. Marching: While keeping your pelvis still, lift the right foot a few inches, put it down then lift left foot. This will mimic a march. Start slow to establish control. Once you have control you may speed it up. Do 10-20x. You MUST keep your lower abdominlas contracted while you march. Breathe naturally    3. Single leg slides: Inhale while you slowly slide one leg out keeping your pelvis still. Only slide your leg as far as you can keep your pelvis still. Exhale as you bring the leg back to the start, contracting the lower abdominals as you do that. Keep your upper body relaxed. Do 5-10 on each side.      [facility] [address], [address] [place], [place] [address] Phone # [phone] Fax [phone]

## 2014-12-21 NOTE — Therapy (Signed)
[facility] [address], [address] [place], [place], [address] Phone: [phone]   Fax:  [phone]  Physical Therapy Treatment  Patient Details  Name: [name] MRN: [id number] Date of Birth: 08/18/1960 Referring Provider:  Richarda Overlie, MD  Encounter Date: 12/21/2014      PT End of Session - 12/21/14 1520    Visit Number 3   Date for PT Re-Evaluation 03/01/15   PT Start Time 1445   PT Stop Time 1525   PT Time Calculation (min) 40 min   Activity Tolerance Patient tolerated treatment well   Behavior During Therapy [facility] for tasks assessed/performed      Past Medical History  Diagnosis Date  . Hyperplastic colon polyp     04/2010  . GERD (gastroesophageal reflux disease)   . Anemia   . Anxiety   . Depression   . Obesity   . Migraine   . DJD (degenerative joint disease)     knee  . IBS (irritable bowel syndrome)   . Menopause     early  . Impaired glucose tolerance 05/18/2011  . ANEMIA-IRON DEFICIENCY 01/03/2007  . ANXIETY 01/03/2007  . DEPRESSION 01/03/2007  . GERD 01/16/2009  . Headache(784.0) 01/03/2007  . HYPERSOMNIA 04/04/2010  . IBS 01/16/2009  . 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 Wheezing 07/02/2010    Past Surgical History  Procedure Laterality Date  . Cholecystectomy    . Appendectomy    . Bladder tac    . Abdominal hysterectomy    . Knee arthroscopy    . Oophorectomy      There were no vitals filed for this visit.  Visit Diagnosis:  Pelvic floor dysfunction      Subjective Assessment - 12/21/14 1450    Subjective I felt a littile sore after last visit. Patient is drinking alot of water.  Patient feels if the roads are rough while in a car it will stress her bladder. Patient is going to the bathroom every hour. Patient goes to the bathroom  without the urge.    Patient Stated Goals reduce the amount she has urinated   Currently in Pain? No/denies                       Pelvic Floor Special Questions - 12/21/14 0001    Pelvic Floor Internal Exam Patient confirms identification and approves physical therapist to assess pelvic muscle strength and integrity   Exam Type Vaginal           [facility] Adult PT Treatment/Exercise - 12/21/14 0001    Lumbar Exercises: Supine   Clam 10 reps  both legs, abdominal bracing, pelvic contraction   Manual Therapy   Soft tissue mobilization to left levator ani,  tight band released afterwards   Myofascial Release left obturator internist and around the urethra   afterwards urethra had full mobility                   PT Short Term Goals - 12/21/14 1519    PT SHORT TERM GOAL #1   Title urge to void was educated on    Time 4   Period Weeks   Status Achieved   PT SHORT TERM GOAL #2   Title understand what bladder irritants are and how they affect the bladder   Time 4   Period Weeks   Status Achieved   PT SHORT TERM GOAL #3   Title ability to contract the  pelvic floor with a lift   Time 4   Period Weeks   Status On-going  working on releasing fascial restrictions           PT Long Term Goals - 12/07/14 1709    PT LONG TERM GOAL #1   Title ability to exercise in the pool for 2 hours without having to get out to urinate   Time 12   Period Weeks   Status New   PT LONG TERM GOAL #2   Title ability to hold her urine 5 min when she has the urge to void   Time 12   Period Weeks   Status New   PT LONG TERM GOAL #3   Title pelvic floor strength >/= 3/5 holding for 10 seconds   Time 12   Period Weeks   Status New   PT LONG TERM GOAL #4   Title wait three hours before she has to urinate due to increased strength   Time 12   Period Weeks   Status New   PT LONG TERM GOAL #5   Title independent with HEP and understands how to progress herself   Time 12   Period Weeks   Status New               Plan - 12/21/14 1523    Clinical Impression Statement --         Problem List Patient Active Problem List   Diagnosis Date Noted  . Chronic pain 05/08/2012  . Muscle spasm of back 05/08/2012  . Chest pain 05/08/2012  . Gastroparesis 05/22/2011  . Impaired glucose tolerance 05/18/2011  . Preventative health care 05/18/2011  . HYPERSOMNIA 04/04/2010  . GERD 01/16/2009  . IBS 01/16/2009  . 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 ANEMIA-IRON DEFICIENCY 01/03/2007  . ANXIETY 01/03/2007  . DEPRESSION 01/03/2007    GRAY,CHERYL,PT 12/21/2014, 3:30 PM  [facility] [address], [address] [place], [place], [address] Phone: [phone]   Fax:  [phone]

## 2014-12-27 ENCOUNTER — Encounter: Payer: Self-pay | Admitting: Physical Therapy

## 2014-12-27 ENCOUNTER — Ambulatory Visit: Payer: 59 | Attending: Obstetrics and Gynecology | Admitting: Physical Therapy

## 2014-12-27 DIAGNOSIS — M6289 Other specified disorders of muscle: Secondary | ICD-10-CM

## 2014-12-27 DIAGNOSIS — N8184 Pelvic muscle wasting: Secondary | ICD-10-CM | POA: Insufficient documentation

## 2014-12-27 NOTE — Patient Instructions (Signed)
Quick Contraction: Gravity Resisted (Sitting)   Sitting, quickly squeeze then fully relax pelvic floor. Perform _1__ sets of _5__. Rest for _1__ seconds between sets. Do _2__ times a day.  Copyright  VHI. All rights reserved.  Slow Contraction: Gravity Resisted (Sitting)   Sitting, slowly squeeze pelvic floor for _5__ seconds. Rest for _5__ seconds. Repeat _10__ times. Do _2__ times a day.  Copyright  VHI. All rights reserved.  Cough: Phase 4 (Supine)   Lie flat. Squeeze pelvic floor and hold. Inhale. Lift head and shoulders. Cough repeatedly. Relax. Repeat _5__ times. Do __1_ times a day.   Copyright  VHI. All rights reserved.  Squat   Standing, squeeze pelvic floor and hold. Squat. Relax. Repeat _5__ times. Do _1__ times a day. Do in the pool.  Copyright  VHI. All rights reserved.  [facility] [address], [address] [place], [place] [address] Phone # [phone] Fax [phone]

## 2014-12-27 NOTE — Therapy (Signed)
[facility] [address] [place], [place], [address] Phone: [phone]   Fax:  [phone]  Physical Therapy Treatment  Patient Details  Name: [name] MRN: [id number] Date of Birth: 04-01-1961 Referring Provider:  Molli Posey, MD  Encounter Date: 12/27/2014      PT End of Session - 12/27/14 1522    Visit Number 4   Date for PT Re-Evaluation 03/01/15   PT Start Time 4193   PT Stop Time 1525   PT Time Calculation (min) 40 min   Activity Tolerance Patient tolerated treatment well   Behavior During Therapy [facility] for tasks assessed/performed      Past Medical History  Diagnosis Date  . Hyperplastic colon polyp     04/2010  . GERD (gastroesophageal reflux disease)   . Anemia   . Anxiety   . Depression   . Obesity   . Migraine   . DJD (degenerative joint disease)     knee  . IBS (irritable bowel syndrome)   . Menopause     early  . Impaired glucose tolerance 05/18/2011  . ANEMIA-IRON DEFICIENCY 01/03/2007  . ANXIETY 01/03/2007  . DEPRESSION 01/03/2007  . GERD 01/16/2009  . Headache(784.0) 01/03/2007  . HYPERSOMNIA 04/04/2010  . IBS 01/16/2009  . 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 Wheezing 07/02/2010    Past Surgical History  Procedure Laterality Date  . Cholecystectomy    . Appendectomy    . Bladder tac    . Abdominal hysterectomy    . Knee arthroscopy    . Oophorectomy      There were no vitals filed for this visit.  Visit Diagnosis:  Pelvic floor dysfunction      Subjective Assessment - 12/27/14 1439    Subjective I felt good after last visit. I have had alot of urgency when I cut back on my water. Urgency when I am in the water and lake. Reviewed bladder diary showed urinary leakage in the pool, cut back on water intake and did not have to go to the bathroom as much,. Patient reports not getting up to urinate now compared to 4 times per night. Patient reports she is not  feeling the left lower abdominal pain daily instead 2 times per week.    Patient Stated Goals reduce the amount she has urinated   Currently in Pain? No/denies                      Pelvic Floor Special Questions - 12/27/14 0001    Pelvic Floor Internal Exam Patient confirms identification and approves physical therapist to assess pelvic muscle strength and integrity   Exam Type Vaginal   Palpation circular contraction, when coughs will bear down   Strength fair squeeze, definite lift   Strength # of seconds 8           [facility] Adult PT Treatment/Exercise - 12/27/14 0001    Lumbar Exercises: Supine   Ab Set 10 reps  10 second with pelvic floor contraction   Manual Therapy   Manual Therapy Soft tissue mobilization;Myofascial release;Internal Pelvic Floor   Soft tissue mobilization abdominal scar and left lower quadrant                PT Education - 12/27/14 1520    Education provided Yes   Education Details pelvic floor contraction in sitting, pelvic floor squat, cough with pelvic floor contraction   Person(s) Educated Patient   Methods Explanation;Demonstration;Tactile  cues;Verbal cues;Handout   Comprehension Returned demonstration;Verbalized understanding          PT Short Term Goals - 12/27/14 1513    PT SHORT TERM GOAL #3   Title ability to contract the pelvic floor with a lift   Time 4   Period Weeks   Status Achieved           PT Long Term Goals - 12/27/14 1445    PT LONG TERM GOAL #1   Title ability to exercise in the pool for 2 hours without having to get out to urinate   Time 12   Period Weeks   Status Not Met  2 times while swimming   PT LONG TERM GOAL #2   Title ability to hold her urine 5 min when she has the urge to void   Time 12   Period Weeks   Status On-going  sometimes able to hold urine for 5 minutes   PT LONG TERM GOAL #3   Title pelvic floor strength >/= 3/5 holding for 10 seconds   Time 12   Period Weeks    Status On-going   PT LONG TERM GOAL #4   Title wait three hours before she has to urinate due to increased strength   Time 12   Period Weeks   Status On-going  sometimes   PT LONG TERM GOAL #5   Title independent with HEP and understands how to progress herself   Time 12   Period Weeks   Status On-going  continues to learn exercises               Plan - 12/27/14 1522    Clinical Impression Statement Patient has increased pelvic floor strength to 3/5 with a circular contraction lift. Patient is able to hold a pelvic floor in hookly for 10 seconds compared to 5.Patient reports she is not having to urinate at night now.  Patient always feel the urge to urinate when she is swimming or flexes the left hip. Patient has 30% less left quadrant pain.  Patient  has met all of her STG''s. Patient continues to need physical therapy to reduce urge to urinate and increased pelvic floor strength.    Pt will benefit from skilled therapeutic intervention in order to improve on the following deficits Decreased activity tolerance;Decreased strength;Decreased scar mobility;Pain;Increased muscle spasms;Decreased endurance;Increased fascial restricitons   Rehab Potential Excellent   Clinical Impairments Affecting Rehab Potential None   PT Frequency 1x / week   PT Duration 12 weeks   PT Treatment/Interventions ADLs/Self Care Home Management;Cryotherapy;Electrical Stimulation;Biofeedback;Ultrasound;Moist Heat;Therapeutic activities;Therapeutic exercise;Neuromuscular re-education;Manual techniques;Patient/family education;Scar mobilization;Passive range of motion   PT Next Visit Plan soft tissue work and pelvic floor strength   PT Home Exercise Plan progress as needed   Consulted and Agree with Plan of Care Patient        Problem List Patient Active Problem List   Diagnosis Date Noted  . Chronic pain 05/08/2012  . Muscle spasm of back 05/08/2012  . Chest pain 05/08/2012  . Gastroparesis  05/22/2011  . Impaired glucose tolerance 05/18/2011  . Preventative health care 05/18/2011  . HYPERSOMNIA 04/04/2010  . GERD 01/16/2009  . IBS 01/16/2009  . 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 ANEMIA-IRON DEFICIENCY 01/03/2007  . ANXIETY 01/03/2007  . DEPRESSION 01/03/2007    Kellyjo Edgren,PT 12/27/2014, 3:28 PM   [facility] [address], [place], [place], [address] Phone: [phone]   Fax:  [phone]

## 2015-01-03 ENCOUNTER — Ambulatory Visit: Payer: 59 | Admitting: Physical Therapy

## 2015-01-04 ENCOUNTER — Ambulatory Visit: Payer: 59 | Admitting: Physical Therapy

## 2015-01-04 ENCOUNTER — Encounter: Payer: Self-pay | Admitting: Physical Therapy

## 2015-01-04 DIAGNOSIS — N8184 Pelvic muscle wasting: Secondary | ICD-10-CM | POA: Diagnosis not present

## 2015-01-04 DIAGNOSIS — M6289 Other specified disorders of muscle: Secondary | ICD-10-CM

## 2015-01-04 NOTE — Therapy (Signed)
[facility] [address] [place], [place], [address] Phone: [phone]   Fax:  [phone]  Physical Therapy Treatment  Patient Details  Name: [name] MRN: [id number] Date of Birth: 10/14/1960 Referring Provider:  [name], [name], MD  Encounter Date: 01/04/2015      PT End of Session - 01/04/15 1607    Visit Number 5   Date for PT Re-Evaluation 03/01/15   PT Start Time 1535   PT Stop Time 1510   PT Time Calculation (min) 1415 min   Activity Tolerance Patient tolerated treatment well   Behavior During Therapy [facility] for tasks assessed/performed      Past Medical History  Diagnosis Date  . Hyperplastic colon polyp     04/2010  . GERD (gastroesophageal reflux disease)   . Anemia   . Anxiety   . Depression   . Obesity   . Migraine   . DJD (degenerative joint disease)     knee  . IBS (irritable bowel syndrome)   . Menopause     early  . Impaired glucose tolerance 05/18/2011  . ANEMIA-IRON DEFICIENCY 01/03/2007  . ANXIETY 01/03/2007  . DEPRESSION 01/03/2007  . GERD 01/16/2009  . Headache(784.0) 01/03/2007  . HYPERSOMNIA 04/04/2010  . IBS 01/16/2009  . 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 Wheezing 07/02/2010    Past Surgical History  Procedure Laterality Date  . Cholecystectomy    . Appendectomy    . Bladder tac    . Abdominal hysterectomy    . Knee arthroscopy    . Oophorectomy      There were no vitals filed for this visit.  Visit Diagnosis:  Pelvic floor dysfunction      Subjective Assessment - 01/04/15 1537    Subjective Low back talking to me a little today, otherwise doing well. Can go 30-60 min before going to the bathroom.   Currently in Pain? Yes   Pain Score 3    Pain Location Back   Pain Orientation Lower   Pain Descriptors / Indicators Aching;Dull   Aggravating Factors  Sitting too long   Pain Relieving Factors sidelying   Multiple Pain Sites No                          [facility] Adult PT Treatment/Exercise - 01/04/15 0001    Lumbar Exercises: Supine   Ab Set 10 reps  Pilates breathe with TA contraction   AB Set Limitations Pilaates breathe with ball squeeze & PF lift 10x   Bent Knee Raise 10 reps  Maintaining TA and PF during ex., alternating   Bent Knee Raise Limitations Knee circles in table top 8x each direction Then 8x bil with PF lift   Bridge 10 reps;5 seconds  Ball bt knees   Lumbar Exercises: Sidelying   Clam 10 reps  VC to add PF lift during clamming motion   Hip Abduction 10 reps  vc for PF lift and TA contraction during   Other Sidelying Lumbar Exercises Knee to chest doing PF lift on knee extension.   Other Sidelying Lumbar Exercises Sidelying long leg circles with PF and TA contraction                  PT Short Term Goals - 01/04/15 1547    PT SHORT TERM GOAL #1   Title urge to void was educated on    Time 4   Period Weeks  Status Achieved   PT SHORT TERM GOAL #2   Title understand what bladder irritants are and how they affect the bladder   Time 4   Period Weeks   Status Achieved   PT SHORT TERM GOAL #3   Title ability to contract the pelvic floor with a lift   Time 4   Period Weeks   Status Achieved           PT Long Term Goals - 01/04/15 1547    PT LONG TERM GOAL #1   Title ability to exercise in the pool for 2 hours without having to get out to urinate   Time 12   Period Weeks   Status On-going  within 1 1/2 hrs she will need to eliminate every 3 hours               Plan - 01/04/15 1609    Clinical Impression Statement Increased difficulty of exercises today where pt had to coordinate contracting her PF and TA while performing an arm or leg movement in both supine or sidelying. Pt took her time and did well focusing . Her urge and urge specifically in the pool are aboutthe same as last week.    Pt will benefit from skilled therapeutic intervention in order  to improve on the following deficits Decreased activity tolerance;Decreased strength;Decreased scar mobility;Pain;Increased muscle spasms;Decreased endurance;Increased fascial restricitons   Rehab Potential Excellent   Clinical Impairments Affecting Rehab Potential None   PT Frequency 1x / week   PT Duration 12 weeks   PT Treatment/Interventions ADLs/Self Care Home Management;Cryotherapy;Electrical Stimulation;Biofeedback;Ultrasound;Moist Heat;Therapeutic activities;Therapeutic exercise;Neuromuscular re-education;Manual techniques;Patient/family education;Scar mobilization;Passive range of motion   PT Next Visit Plan soft tissue work and pelvic floor strength   Consulted and Agree with Plan of Care Patient        Problem List Patient Active Problem List   Diagnosis Date Noted  . Chronic pain 05/08/2012  . Muscle spasm of back 05/08/2012  . Chest pain 05/08/2012  . Gastroparesis 05/22/2011  . Impaired glucose tolerance 05/18/2011  . Preventative health care 05/18/2011  . HYPERSOMNIA 04/04/2010  . GERD 01/16/2009  . IBS 01/16/2009  . 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 ANEMIA-IRON DEFICIENCY 01/03/2007  . ANXIETY 01/03/2007  . DEPRESSION 01/03/2007    [name] 01/04/2015, 4:17 PM  [facility] [address], [address] [place], [place], [address] Phone: [phone]   Fax:  [phone]

## 2015-01-09 ENCOUNTER — Encounter: Payer: 59 | Admitting: Physical Therapy

## 2015-01-18 ENCOUNTER — Encounter: Payer: Self-pay | Admitting: Physical Therapy

## 2015-01-18 ENCOUNTER — Ambulatory Visit: Payer: 59 | Admitting: Physical Therapy

## 2015-01-18 DIAGNOSIS — M6289 Other specified disorders of muscle: Secondary | ICD-10-CM

## 2015-01-18 DIAGNOSIS — N8184 Pelvic muscle wasting: Secondary | ICD-10-CM | POA: Diagnosis not present

## 2015-01-18 NOTE — Therapy (Signed)
[facility] [address] [place], [place], [address] Phone: [phone]   Fax:  [phone]  Physical Therapy Treatment  Patient Details  Name: [name] MRN: [id number] Date of Birth: 1960-12-29 Referring Provider:  Richarda Overlie, MD  Encounter Date: 01/18/2015      PT End of Session - 01/18/15 1404    Visit Number 6   Date for PT Re-Evaluation 03/01/15   PT Start Time 1400   PT Stop Time 1440   PT Time Calculation (min) 40 min   Activity Tolerance Patient tolerated treatment well   Behavior During Therapy [facility] for tasks assessed/performed      Past Medical History  Diagnosis Date  . Hyperplastic colon polyp     04/2010  . GERD (gastroesophageal reflux disease)   . Anemia   . Anxiety   . Depression   . Obesity   . Migraine   . DJD (degenerative joint disease)     knee  . IBS (irritable bowel syndrome)   . Menopause     early  . Impaired glucose tolerance 05/18/2011  . ANEMIA-IRON DEFICIENCY 01/03/2007  . ANXIETY 01/03/2007  . DEPRESSION 01/03/2007  . GERD 01/16/2009  . Headache(784.0) 01/03/2007  . HYPERSOMNIA 04/04/2010  . IBS 01/16/2009  . 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 Wheezing 07/02/2010    Past Surgical History  Procedure Laterality Date  . Cholecystectomy    . Appendectomy    . Bladder tac    . Abdominal hysterectomy    . Knee arthroscopy    . Oophorectomy      There were no vitals filed for this visit.  Visit Diagnosis:  Pelvic floor dysfunction      Subjective Assessment - 01/18/15 1405    Subjective I feel pretty good. No abdominal pain for  1 week. Yesterday I was stressed due to having lab work and went to the bathroom every hour.    Patient Stated Goals reduce the amount she has urinated   Currently in Pain? Yes   Pain Score 9    Pain Location Back   Pain Orientation Lower   Pain Descriptors / Indicators Aching   Pain Type Chronic pain   Pain  Radiating Towards shoots into left hip    Pain Onset More than a month ago   Pain Frequency Intermittent   Aggravating Factors  sitting too long   Pain Relieving Factors sidely   Multiple Pain Sites No                         [facility] Adult PT Treatment/Exercise - 01/18/15 0001    Manual Therapy   Manual Therapy Soft tissue mobilization;Myofascial release;Internal Pelvic Floor   Soft tissue mobilization abdominal scar and left lower quadrant   Myofascial Release levator ani bil. outside clothes, passively stretched bil. piriformis                  PT Short Term Goals - 01/04/15 1547    PT SHORT TERM GOAL #1   Title urge to void was educated on    Time 4   Period Weeks   Status Achieved   PT SHORT TERM GOAL #2   Title understand what bladder irritants are and how they affect the bladder   Time 4   Period Weeks   Status Achieved   PT SHORT TERM GOAL #3   Title ability to contract the pelvic floor with a  lift   Time 4   Period Weeks   Status Achieved           PT Long Term Goals - 01/18/15 1406    PT LONG TERM GOAL #1   Title ability to exercise in the pool for 2 hours without having to get out to urinate   Time 12   Period Weeks   Status On-going  1 hour 10 min.    PT LONG TERM GOAL #2   Title ability to hold her urine 5 min when she has the urge to void   Time 12   Period Weeks   Status Achieved  45 min drive.    PT LONG TERM GOAL #3   Title pelvic floor strength >/= 3/5 holding for 10 seconds   Time 12   Period Weeks   Status On-going   PT LONG TERM GOAL #4   Title wait three hours before she has to urinate due to increased strength   Time 12   Period Weeks   Status On-going   PT LONG TERM GOAL #5   Title independent with HEP and understands how to progress herself   Time 12   Period Weeks   Status On-going  still learning exercises               Plan - 01/18/15 1439    Clinical Impression Statement Patient has pain  with intercourse and  feels like she is tearing.  Patient unable to exercise today due to back ain 9/10.  Patient has increased mobility of abdominal scar and is not as adhered to the abdominal wall. Patieint is able to hold her urine for 1 hour and 15 minutes.  Patient is waiting for a back specialist to call her.  Patient benefit from physical therapy to decrease pelvic pain with intercourse, improve pelvic strength and imporve core strength.     Pt will benefit from skilled therapeutic intervention in order to improve on the following deficits Decreased activity tolerance;Decreased strength;Decreased scar mobility;Pain;Increased muscle spasms;Decreased endurance;Increased fascial restricitons   Clinical Impairments Affecting Rehab Potential None   PT Frequency 1x / week   PT Duration 12 weeks   PT Treatment/Interventions ADLs/Self Care Home Management;Cryotherapy;Electrical Stimulation;Biofeedback;Ultrasound;Moist Heat;Therapeutic activities;Therapeutic exercise;Neuromuscular re-education;Manual techniques;Patient/family education;Scar mobilization;Passive range of motion   PT Next Visit Plan internal soft tissue work    PT Home Exercise Plan progress as needed   Consulted and Agree with Plan of Care Patient        Problem List Patient Active Problem List   Diagnosis Date Noted  . Chronic pain 05/08/2012  . Muscle spasm of back 05/08/2012  . Chest pain 05/08/2012  . Gastroparesis 05/22/2011  . Impaired glucose tolerance 05/18/2011  . Preventative health care 05/18/2011  . HYPERSOMNIA 04/04/2010  . GERD 01/16/2009  . IBS 01/16/2009  . 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 ANEMIA-IRON DEFICIENCY 01/03/2007  . ANXIETY 01/03/2007  . DEPRESSION 01/03/2007    GRAY,CHERYL,PT 01/18/2015, 2:44 PM   [facility] [address], [address] [place], [place], [address] Phone: [phone]   Fax:  [phone]

## 2015-01-19 ENCOUNTER — Other Ambulatory Visit (INDEPENDENT_AMBULATORY_CARE_PROVIDER_SITE_OTHER): Payer: 59

## 2015-01-19 ENCOUNTER — Telehealth: Payer: Self-pay

## 2015-01-19 ENCOUNTER — Other Ambulatory Visit: Payer: 59

## 2015-01-19 DIAGNOSIS — Z0000 Encounter for general adult medical examination without abnormal findings: Secondary | ICD-10-CM | POA: Diagnosis not present

## 2015-01-19 DIAGNOSIS — R7989 Other specified abnormal findings of blood chemistry: Secondary | ICD-10-CM

## 2015-01-19 LAB — LIPID PANEL
CHOL/HDL RATIO: 4
CHOLESTEROL: 153 mg/dL (ref 0–200)
HDL: 42.2 mg/dL (ref >39.00)
NonHDL: 111.24
Triglycerides: 312 mg/dL — ABNORMAL HIGH (ref 0.0–149.0)
VLDL: 62.4 mg/dL — ABNORMAL HIGH (ref 0.0–40.0)

## 2015-01-19 LAB — CBC WITH DIFFERENTIAL/PLATELET
BASOS PCT: 0.7 % (ref 0.0–3.0)
Basophils Absolute: 0 10*3/uL (ref 0.0–0.1)
Eosinophils Absolute: 0.2 10*3/uL (ref 0.0–0.7)
Eosinophils Relative: 2.6 % (ref 0.0–5.0)
HCT: 39.2 % (ref 36.0–46.0)
HEMOGLOBIN: 13.4 g/dL (ref 12.0–15.0)
Lymphocytes Relative: 35.6 % (ref 12.0–46.0)
Lymphs Abs: 2.2 10*3/uL (ref 0.7–4.0)
MCHC: 34.2 g/dL (ref 30.0–36.0)
MCV: 85.3 fl (ref 78.0–100.0)
MONO ABS: 0.4 10*3/uL (ref 0.1–1.0)
MONOS PCT: 6.6 % (ref 3.0–12.0)
NEUTROS ABS: 3.4 10*3/uL (ref 1.4–7.7)
NEUTROS PCT: 54.5 % (ref 43.0–77.0)
PLATELETS: 209 10*3/uL (ref 150.0–400.0)
RBC: 4.59 Mil/uL (ref 3.87–5.11)
RDW: 12.8 % (ref 11.5–15.5)
WBC: 6.3 10*3/uL (ref 4.0–10.5)

## 2015-01-19 LAB — HEPATIC FUNCTION PANEL
ALT: 13 U/L (ref 0–35)
AST: 13 U/L (ref 0–37)
Albumin: 3.9 g/dL (ref 3.5–5.2)
Alkaline Phosphatase: 51 U/L (ref 39–117)
BILIRUBIN DIRECT: 0 mg/dL (ref 0.0–0.3)
TOTAL PROTEIN: 6.9 g/dL (ref 6.0–8.3)
Total Bilirubin: 0.3 mg/dL (ref 0.2–1.2)

## 2015-01-19 LAB — BASIC METABOLIC PANEL
BUN: 11 mg/dL (ref 6–23)
CALCIUM: 9.2 mg/dL (ref 8.4–10.5)
CHLORIDE: 104 meq/L (ref 96–112)
CO2: 26 meq/L (ref 19–32)
Creatinine, Ser: 0.86 mg/dL (ref 0.40–1.20)
GFR: 72.73 mL/min (ref >60.00)
Glucose, Bld: 109 mg/dL — ABNORMAL HIGH (ref 70–99)
POTASSIUM: 4 meq/L (ref 3.5–5.1)
Sodium: 138 mEq/L (ref 135–145)

## 2015-01-19 LAB — URINALYSIS, ROUTINE W REFLEX MICROSCOPIC
BILIRUBIN URINE: NEGATIVE
HGB URINE DIPSTICK: NEGATIVE
KETONES UR: NEGATIVE
Leukocytes, UA: NEGATIVE
NITRITE: NEGATIVE
RBC / HPF: NONE SEEN (ref 0–?)
SPECIFIC GRAVITY, URINE: 1.02 (ref 1.000–1.030)
Total Protein, Urine: NEGATIVE
URINE GLUCOSE: NEGATIVE
UROBILINOGEN UA: 0.2 (ref 0.0–1.0)
pH: 6 (ref 5.0–8.0)

## 2015-01-19 LAB — TSH: TSH: 1.65 u[IU]/mL (ref 0.35–4.50)

## 2015-01-19 LAB — LDL CHOLESTEROL, DIRECT: Direct LDL: 61 mg/dL

## 2015-01-25 ENCOUNTER — Ambulatory Visit: Payer: 59 | Attending: Obstetrics and Gynecology | Admitting: Physical Therapy

## 2015-01-25 ENCOUNTER — Encounter: Payer: Self-pay | Admitting: Physical Therapy

## 2015-01-25 DIAGNOSIS — N8184 Pelvic muscle wasting: Secondary | ICD-10-CM | POA: Insufficient documentation

## 2015-01-25 DIAGNOSIS — M6289 Other specified disorders of muscle: Secondary | ICD-10-CM

## 2015-01-25 NOTE — Therapy (Signed)
[facility] [address] [place], [place], [address] Phone: [phone]   Fax:  [phone]  Physical Therapy Treatment  Patient Details  Name: [name] MRN: [id number] Date of Birth: 10/13/1960 Referring Provider:  Richarda Overlie, MD  Encounter Date: 01/25/2015      PT End of Session - 01/25/15 0930    Visit Number 7   Date for PT Re-Evaluation 03/01/15   PT Start Time 0930   PT Stop Time 1010   PT Time Calculation (min) 40 min   Activity Tolerance Patient tolerated treatment well   Behavior During Therapy [facility] for tasks assessed/performed      Past Medical History  Diagnosis Date  . Hyperplastic colon polyp     04/2010  . GERD (gastroesophageal reflux disease)   . Anemia   . Anxiety   . Depression   . Obesity   . Migraine   . DJD (degenerative joint disease)     knee  . IBS (irritable bowel syndrome)   . Menopause     early  . Impaired glucose tolerance 05/18/2011  . ANEMIA-IRON DEFICIENCY 01/03/2007  . ANXIETY 01/03/2007  . DEPRESSION 01/03/2007  . GERD 01/16/2009  . Headache(784.0) 01/03/2007  . HYPERSOMNIA 04/04/2010  . IBS 01/16/2009  . 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 Wheezing 07/02/2010    Past Surgical History  Procedure Laterality Date  . Cholecystectomy    . Appendectomy    . Bladder tac    . Abdominal hysterectomy    . Knee arthroscopy    . Oophorectomy      There were no vitals filed for this visit.  Visit Diagnosis:  Pelvic floor dysfunction      Subjective Assessment - 01/25/15 0934    Subjective One time I was able to hold my urine 2 hours.  I had alot of burning in left foot.    Patient Stated Goals reduce the amount she has urinated   Currently in Pain? Yes   Pain Score 8    Pain Location Back   Pain Orientation Lower   Pain Descriptors / Indicators Burning  burning in left foot, numbness in toes   Pain Type Chronic pain   Pain Radiating Towards  Left hip into left foot   Pain Onset More than a month ago   Pain Frequency Intermittent   Aggravating Factors  sitting too long   Pain Relieving Factors sidely   Multiple Pain Sites No                      Pelvic Floor Special Questions - 01/25/15 0001    Pelvic Floor Internal Exam Patient confirms identification and approves physical therapist to assess pelvic muscle strength and integrity   Exam Type Vaginal   Palpation circular contraction, when coughs will bear down   Strength fair squeeze, definite lift           [facility] Adult PT Treatment/Exercise - 01/25/15 0001    Manual Therapy   Manual Therapy Soft tissue mobilization;Myofascial release;Internal Pelvic Floor   Soft tissue mobilization bil. ischiocavernosus and bulbocoavernosus   Myofascial Release bil. sides of urethra   Internal Pelvic Floor bil. piriformis, obturator internist, iliococcygeus, and coccygeus with hip movement                PT Education - 01/25/15 1009    Education provided Yes   Education Details educated patient to hold her urine every 1 hour  and 15 min.    Person(s) Educated Patient   Methods Explanation;Verbal cues   Comprehension Verbalized understanding;Returned demonstration          PT Short Term Goals - 01/04/15 1547    PT SHORT TERM GOAL #1   Title urge to void was educated on    Time 4   Period Weeks   Status Achieved   PT SHORT TERM GOAL #2   Title understand what bladder irritants are and how they affect the bladder   Time 4   Period Weeks   Status Achieved   PT SHORT TERM GOAL #3   Title ability to contract the pelvic floor with a lift   Time 4   Period Weeks   Status Achieved           PT Long Term Goals - 01/25/15 0930    PT LONG TERM GOAL #1   Title ability to exercise in the pool for 2 hours without having to get out to urinate   Time 12   Period Weeks   Status On-going  1 time compared to 5   PT LONG TERM GOAL #2   Title ability to  hold her urine 5 min when she has the urge to void   Time 12   Period Weeks   Status Achieved   PT LONG TERM GOAL #3   Title pelvic floor strength >/= 3/5 holding for 10 seconds   Time 12   Period Weeks   Status On-going   PT LONG TERM GOAL #4   Title wait three hours before she has to urinate due to increased strength   Time 12   Period Weeks   Status On-going  1 hour   PT LONG TERM GOAL #5   Title independent with HEP and understands how to progress herself   Time 12   Period Weeks   Status On-going  still learning               Plan - 01/25/15 1011    Clinical Impression Statement Patient continues to have circular contraction and strength 3/5 of pelvic floor.  Patient was able to hold her urine for 2 hours 1 time.  Patient is now able to urinate 1 time compared to 5 times when exercising in the pool.  Patient continues to have many trigger points in the pelvic floor muscles which could contribute to burninand numbness in left foot. Patinet would benenfit from physical therapy to decrease pain and improve strength.    Pt will benefit from skilled therapeutic intervention in order to improve on the following deficits Decreased activity tolerance;Decreased strength;Decreased scar mobility;Pain;Increased muscle spasms;Decreased endurance;Increased fascial restricitons   Rehab Potential Excellent   Clinical Impairments Affecting Rehab Potential None   PT Frequency 1x / week   PT Duration 12 weeks   PT Treatment/Interventions ADLs/Self Care Home Management;Cryotherapy;Electrical Stimulation;Biofeedback;Ultrasound;Moist Heat;Therapeutic activities;Therapeutic exercise;Neuromuscular re-education;Manual techniques;Patient/family education;Scar mobilization;Passive range of motion   PT Next Visit Plan internal soft tissue work ; discuss yoga   PT Home Exercise Plan progress as needed   Consulted and Agree with Plan of Care Patient        Problem List Patient Active Problem  List   Diagnosis Date Noted  . Chronic pain 05/08/2012  . Muscle spasm of back 05/08/2012  . Chest pain 05/08/2012  . Gastroparesis 05/22/2011  . Impaired glucose tolerance 05/18/2011  . Preventative health care 05/18/2011  . HYPERSOMNIA 04/04/2010  . GERD 01/16/2009  .  IBS 01/16/2009  . 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 ANEMIA-IRON DEFICIENCY 01/03/2007  . ANXIETY 01/03/2007  . DEPRESSION 01/03/2007    Claudett Bayly,PT 01/25/2015, 10:14 AM  [facility] [address] [place], [place], [address] Phone: [phone]   Fax:  [phone]

## 2015-01-27 ENCOUNTER — Encounter: Payer: Self-pay | Admitting: Internal Medicine

## 2015-01-27 ENCOUNTER — Ambulatory Visit (INDEPENDENT_AMBULATORY_CARE_PROVIDER_SITE_OTHER): Payer: 59 | Admitting: Internal Medicine

## 2015-01-27 VITALS — BP 138/80 | HR 85 | Temp 98.2°F | Ht 66.0 in | Wt 243.0 lb

## 2015-01-27 DIAGNOSIS — Z0000 Encounter for general adult medical examination without abnormal findings: Secondary | ICD-10-CM

## 2015-01-27 NOTE — Assessment & Plan Note (Signed)

## 2015-01-27 NOTE — Patient Instructions (Signed)
Please continue all other medications as before, and refills have been done if requested.  Please have the pharmacy call with any other refills you may need.  Please continue your efforts at being more active, low cholesterol diet, and weight control.  You are otherwise up to date with prevention measures today.  Please keep your appointments with your specialists as you may have planned  Please return in 1 year for your yearly visit, or sooner if needed, with Lab testing done 3-5 days before  

## 2015-01-27 NOTE — Progress Notes (Signed)
Subjective:    Patient ID: [name], female    DOB: 06/26/1960, 54 y.o.   MRN: [id number]  HPI  Here for wellness and f/u;  Overall doing ok;  Pt denies Chest pain, worsening SOB, DOE, wheezing, orthopnea, PND, worsening LE edema, palpitations, dizziness or syncope.  Pt denies neurological change such as new headache, facial or extremity weakness.  Pt denies polydipsia, polyuria, or low sugar symptoms. Pt states overall good compliance with treatment and medications, good tolerability, and has been trying to follow appropriate diet.  Pt denies worsening depressive symptoms, suicidal ideation or panic. No fever, night sweats, wt loss, loss of appetite, or other constitutional symptoms.  Pt states good ability with ADL's, has low fall risk, home safety reviewed and adequate, no other significant changes in hearing or vision, and only occasionally active with exercise. Had recent low Vit D, now on 5000 units per day.  Seeing GI at [facility], has slow empyting stomach per pt.  To see Neurosurg in [place] for back pain in 2 wks, plans to get sleep study through him pt state.  Was referred per podiatry for ? Sciatica, has known lumbar DDD.  Past Medical History  Diagnosis Date  . Hyperplastic colon polyp     04/2010  . GERD (gastroesophageal reflux disease)   . Anemia   . Anxiety   . Depression   . Obesity   . Migraine   . DJD (degenerative joint disease)     knee  . IBS (irritable bowel syndrome)   . Menopause     early  . Impaired glucose tolerance 05/18/2011  . ANEMIA-IRON DEFICIENCY 01/03/2007  . ANXIETY 01/03/2007  . DEPRESSION 01/03/2007  . GERD 01/16/2009  . Headache(784.0) 01/03/2007  . HYPERSOMNIA 04/04/2010  . IBS 01/16/2009  . 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 Wheezing 07/02/2010   Past Surgical History  Procedure Laterality Date  . Cholecystectomy    . Appendectomy    . Bladder tac    . Abdominal hysterectomy    . Knee arthroscopy    . Oophorectomy      reports that she has never smoked. She has never used smokeless tobacco. She reports that she does not drink alcohol or use illicit drugs. family history includes Diabetes in her brother; Hypertension in her mother. Allergies  Allergen Reactions  . Morphine Anaphylaxis  . Sucralfate Other (See Comments)    Blurry vision  . Azithromycin Nausea And Vomiting  . Codeine Nausea Only and Other (See Comments)    Hot flashes also  . Penicillins Hives, Nausea Only and Swelling  . Amoxicillin Nausea Only  . Hydrocodone Itching, Nausea Only and Rash  . Omeprazole Itching and Rash  . Oxycodone-Acetaminophen Nausea Only  . Tramadol Itching, Nausea Only and Rash   Current Outpatient Prescriptions on File Prior to Visit  Medication Sig Dispense Refill  . esomeprazole (NEXIUM) 20 MG capsule Take 20 mg by mouth daily at 12 noon.    Marland Kitchen estradiol (ESTRACE) 2 MG tablet Take 2 mg by mouth every evening.    Marland Kitchen ibuprofen (ADVIL,MOTRIN) 200 MG tablet Take 400 mg by mouth every 6 (six) hours as needed for pain.    . promethazine (PHENERGAN) 25 MG tablet Take 1 tablet (25 mg total) by mouth every 6 (six) hours as needed for nausea. 15 tablet 0  . ranitidine (ZANTAC) 300 MG capsule Take 300 mg by mouth 2 (two) times daily.     No current facility-administered medications on file prior  to visit.    Review of Systems Constitutional: Negative for increased diaphoresis, other activity, appetite or siginficant weight change other than noted HENT: Negative for worsening hearing loss, ear pain, facial swelling, mouth sores and neck stiffness.   Eyes: Negative for other worsening pain, redness or visual disturbance.  Respiratory: Negative for shortness of breath and wheezing  Cardiovascular: Negative for chest pain and palpitations.  Gastrointestinal: Negative for diarrhea, blood in stool, abdominal distention or other pain Genitourinary: Negative for hematuria, flank pain or change in urine volume.  Musculoskeletal:  Negative for myalgias or other joint complaints.  Skin: Negative for color change and wound or drainage.  Neurological: Negative for syncope and numbness. other than noted Hematological: Negative for adenopathy. or other swelling Psychiatric/Behavioral: Negative for hallucinations, SI, self-injury, decreased concentration or other worsening agitation.      Objective:   Physical Exam BP 138/80 mmHg  Pulse 85  Temp(Src) 98.2 F (36.8 C) (Oral)  Ht  (1.676 m)  Wt 243 lb (110.224 kg)  BMI 39.24 kg/m2  SpO2 98% VS noted,  Constitutional: Pt is oriented to person, place, and time. Appears well-developed and well-nourished, in no significant distress Head: Normocephalic and atraumatic.  Right Ear: External ear normal.  Left Ear: External ear normal.  Nose: Nose normal.  Mouth/Throat: Oropharynx is clear and moist.  Eyes: Conjunctivae and EOM are normal. Pupils are equal, round, and reactive to light.  Neck: Normal range of motion. Neck supple. No JVD present. No tracheal deviation present or significant neck LA or mass Cardiovascular: Normal rate, regular rhythm, normal heart sounds and intact distal pulses.   Pulmonary/Chest: Effort normal and breath sounds without rales or wheezing  Abdominal: Soft. Bowel sounds are normal. NT. No HSM  Musculoskeletal: Normal range of motion. Exhibits no edema.  Lymphadenopathy:  Has no cervical adenopathy.  Neurological: Pt is alert and oriented to person, place, and time. Pt has normal reflexes. No cranial nerve deficit. Motor grossly intact Skin: Skin is warm and dry. No rash noted.  Psychiatric:  Has soimewhat irritable mood and affect. Behavior is normal.       Assessment & Plan:

## 2015-01-27 NOTE — Progress Notes (Signed)
Pre visit review using our clinic review tool, if applicable. No additional management support is needed unless otherwise documented below in the visit note. 

## 2015-02-07 ENCOUNTER — Encounter: Payer: Self-pay | Admitting: Physical Therapy

## 2015-02-07 ENCOUNTER — Ambulatory Visit: Payer: 59 | Admitting: Physical Therapy

## 2015-02-07 DIAGNOSIS — M6289 Other specified disorders of muscle: Secondary | ICD-10-CM

## 2015-02-07 DIAGNOSIS — N8184 Pelvic muscle wasting: Secondary | ICD-10-CM | POA: Diagnosis not present

## 2015-02-07 NOTE — Therapy (Signed)
[facility] [address] [place], [place], [address] Phone: [phone]   Fax:  [phone]  Physical Therapy Treatment  Patient Details  Name: [name] MRN: [id number] Date of Birth: 1961/04/27 Referring Provider:  Richarda Overlie, MD  Encounter Date: 02/07/2015      PT End of Session - 02/07/15 1411    Visit Number 8   Date for PT Re-Evaluation 03/01/15   PT Start Time 1400   PT Stop Time 1440   PT Time Calculation (min) 40 min   Activity Tolerance Patient tolerated treatment well   Behavior During Therapy [facility] for tasks assessed/performed      Past Medical History  Diagnosis Date  . Hyperplastic colon polyp     04/2010  . GERD (gastroesophageal reflux disease)   . Anemia   . Anxiety   . Depression   . Obesity   . Migraine   . DJD (degenerative joint disease)     knee  . IBS (irritable bowel syndrome)   . Menopause     early  . Impaired glucose tolerance 05/18/2011  . ANEMIA-IRON DEFICIENCY 01/03/2007  . ANXIETY 01/03/2007  . DEPRESSION 01/03/2007  . GERD 01/16/2009  . Headache(784.0) 01/03/2007  . HYPERSOMNIA 04/04/2010  . IBS 01/16/2009  . 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 Wheezing 07/02/2010    Past Surgical History  Procedure Laterality Date  . Cholecystectomy    . Appendectomy    . Bladder tac    . Abdominal hysterectomy    . Knee arthroscopy    . Oophorectomy      There were no vitals filed for this visit.  Visit Diagnosis:  Pelvic floor dysfunction      Subjective Assessment - 02/07/15 1411    Subjective I have been very stressed and I have been urinating very frequently.    Patient Stated Goals reduce the amount she has urinated   Pain Score 8    Pain Location Back   Pain Orientation Lower   Pain Descriptors / Indicators Burning   Pain Type Chronic pain   Pain Onset More than a month ago   Pain Frequency Constant   Aggravating Factors  sitting too long    Pain Relieving Factors sidely   Multiple Pain Sites No                      Pelvic Floor Special Questions - 02/07/15 0001    Pelvic Floor Internal Exam Patient confirms identification and approves physical therapist to assess pelvic muscle strength and integrity   Exam Type Vaginal   Palpation tenderness located on left pelvic floor muscles and restrictions on left side of urethra           [facility] Adult PT Treatment/Exercise - 02/07/15 0001    Manual Therapy   Manual Therapy Soft tissue mobilization;Myofascial release;Internal Pelvic Floor   Myofascial Release left side of rectum and urethra   Internal Pelvic Floor left piriformis, bil. obturator internist, left side of puborectalis, left iliococcygeus and left side of rectum                PT Education - 02/07/15 1440    Education provided No          PT Short Term Goals - 01/04/15 1547    PT SHORT TERM GOAL #1   Title urge to void was educated on    Time 4   Period Weeks   Status Achieved  PT SHORT TERM GOAL #2   Title understand what bladder irritants are and how they affect the bladder   Time 4   Period Weeks   Status Achieved   PT SHORT TERM GOAL #3   Title ability to contract the pelvic floor with a lift   Time 4   Period Weeks   Status Achieved           PT Long Term Goals - 02/07/15 1419    PT LONG TERM GOAL #1   Title ability to exercise in the pool for 2 hours without having to get out to urinate   Time 12   Period Weeks   Status On-going  difficult week   PT LONG TERM GOAL #2   Title ability to hold her urine 5 min when she has the urge to void   Time 12   Period Weeks   Status Achieved   PT LONG TERM GOAL #3   Title pelvic floor strength >/= 3/5 holding for 10 seconds   Time 12   Period Weeks   Status On-going   PT LONG TERM GOAL #4   Title wait three hours before she has to urinate due to increased strength   Time 12   Period Weeks   Status On-going   PT LONG  TERM GOAL #5   Title independent with HEP and understands how to progress herself   Time 12   Period Weeks   Status On-going               Plan - 02/07/15 1440    Clinical Impression Statement after therapy the pulling and tension internal pelvic floor decreased by 90%.  Patient reports pelvic pain has decreased 90%.  Prior to last week patient was able to wait for 2 hours in pool to urinate, 3 hour car ride, and delay need to urinate.  The past week patient has had increased stress therefore has increased frequency of urination.  Patient would benefit from physical therapy to imporve pelvic floor strength.    Pt will benefit from skilled therapeutic intervention in order to improve on the following deficits Decreased activity tolerance;Decreased strength;Decreased scar mobility;Pain;Increased muscle spasms;Decreased endurance;Increased fascial restricitons   Rehab Potential Excellent   Clinical Impairments Affecting Rehab Potential None   PT Frequency 1x / week   PT Duration 12 weeks   PT Treatment/Interventions ADLs/Self Care Home Management;Cryotherapy;Electrical Stimulation;Biofeedback;Ultrasound;Moist Heat;Therapeutic activities;Therapeutic exercise;Neuromuscular re-education;Manual techniques;Patient/family education;Scar mobilization;Passive range of motion   PT Next Visit Plan internal soft tissue work ; discuss yoga   PT Home Exercise Plan progress as needed   Consulted and Agree with Plan of Care Patient        Problem List Patient Active Problem List   Diagnosis Date Noted  . Chronic pain 05/08/2012  . Muscle spasm of back 05/08/2012  . Chest pain 05/08/2012  . Gastroparesis 05/22/2011  . Impaired glucose tolerance 05/18/2011  . Preventative health care 05/18/2011  . HYPERSOMNIA 04/04/2010  . GERD 01/16/2009  . IBS 01/16/2009  . 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 ANEMIA-IRON DEFICIENCY 01/03/2007  . ANXIETY 01/03/2007  . DEPRESSION 01/03/2007     GRAY,CHERYL,PT 02/07/2015, 2:44 PM  [facility] [address], [address] [place], [place], [address] Phone: [phone]   Fax:  [phone]

## 2015-02-13 ENCOUNTER — Encounter: Payer: 59 | Admitting: Physical Therapy

## 2015-02-16 ENCOUNTER — Encounter: Payer: 59 | Admitting: Physical Therapy

## 2015-02-20 ENCOUNTER — Encounter: Payer: Self-pay | Admitting: Physical Therapy

## 2015-02-20 ENCOUNTER — Ambulatory Visit: Payer: 59 | Admitting: Physical Therapy

## 2015-02-20 DIAGNOSIS — M6289 Other specified disorders of muscle: Secondary | ICD-10-CM

## 2015-02-20 DIAGNOSIS — N8184 Pelvic muscle wasting: Secondary | ICD-10-CM | POA: Diagnosis not present

## 2015-02-20 NOTE — Therapy (Signed)
[facility] [address], [address] [place], [place], [address] Phone: [phone]   Fax:  [phone]  Physical Therapy Treatment  Patient Details  Name: [name] MRN: [id number] Date of Birth: 1960/08/24 Referring Provider:  Richarda Overlie, MD  Encounter Date: 02/20/2015      PT End of Session - 02/20/15 1452    Visit Number 9   Date for PT Re-Evaluation 03/29/15   PT Start Time 1450   PT Stop Time 1530   PT Time Calculation (min) 40 min   Activity Tolerance Patient tolerated treatment well   Behavior During Therapy [facility] for tasks assessed/performed      Past Medical History  Diagnosis Date  . Hyperplastic colon polyp     04/2010  . GERD (gastroesophageal reflux disease)   . Anemia   . Anxiety   . Depression   . Obesity   . Migraine   . DJD (degenerative joint disease)     knee  . IBS (irritable bowel syndrome)   . Menopause     early  . Impaired glucose tolerance 05/18/2011  . ANEMIA-IRON DEFICIENCY 01/03/2007  . ANXIETY 01/03/2007  . DEPRESSION 01/03/2007  . GERD 01/16/2009  . Headache(784.0) 01/03/2007  . HYPERSOMNIA 04/04/2010  . IBS 01/16/2009  . 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 Wheezing 07/02/2010    Past Surgical History  Procedure Laterality Date  . Cholecystectomy    . Appendectomy    . Bladder tac    . Abdominal hysterectomy    . Knee arthroscopy    . Oophorectomy      There were no vitals filed for this visit.  Visit Diagnosis:  Pelvic floor dysfunction - Plan: PT plan of care cert/re-cert      Subjective Assessment - 02/20/15 1452    Subjective I missed last visit due to taking a medication for my back and caused me to be sick.  I will have an injection in my back to help it. MD said I have some nerve damage in left foot. Reports left lower quadrant pain decreased by 90%.    Patient Stated Goals reduce the amount she has urinated   Currently in Pain? Yes   Pain Score  6    Pain Location Abdomen   Pain Orientation Left   Pain Descriptors / Indicators Burning   Pain Type Chronic pain   Pain Onset More than a month ago   Pain Frequency Intermittent   Aggravating Factors  stress, sick   Pain Relieving Factors swimming   Multiple Pain Sites No            [facility] PT Assessment - 02/20/15 0001    Assessment   Medical Diagnosis Pelvic floor issues   Onset Date/Surgical Date 06/25/11   Prior Therapy None   Precautions   Precautions None   Balance Screen   Has the patient fallen in the past 6 months No   Has the patient had a decrease in activity level because of a fear of falling?  No   Is the patient reluctant to leave their home because of a fear of falling?  No   Prior Function   Level of Independence Independent   Leisure pool aerobics   Cognition   Overall Cognitive Status Within Functional Limits for tasks assessed   Observation/Other Assessments   Skin Integrity c-section scar has decreased mobility   Focus on Therapeutic Outcomes (FOTO)  51% limitation for urinary symptoms   Strength  Overall Strength Comments abdominal strength is 3/5; right hip extension 5/5                  Pelvic Floor Special Questions - 02/20/15 0001    Pelvic Floor Internal Exam Patient confirms identification and approves physical therapist to assess pelvic muscle strength and integrity   Exam Type Vaginal   Palpation tenderness located on bil. obturator internist, left puborectalis and restriction of left side of urethra.    Strength fair squeeze, definite lift   Strength # of seconds 8           [facility] Adult PT Treatment/Exercise - 02/20/15 0001    Manual Therapy   Manual Therapy Soft tissue mobilization;Myofascial release;Internal Pelvic Floor   Internal Pelvic Floor left piriformis, bil. obturator internist, left side of puborectalis, left iliococcygeus and left side of rectum                PT Education - 02/20/15 1527     Education provided No          PT Short Term Goals - 01/04/15 1547    PT SHORT TERM GOAL #1   Title urge to void was educated on    Time 4   Period Weeks   Status Achieved   PT SHORT TERM GOAL #2   Title understand what bladder irritants are and how they affect the bladder   Time 4   Period Weeks   Status Achieved   PT SHORT TERM GOAL #3   Title ability to contract the pelvic floor with a lift   Time 4   Period Weeks   Status Achieved           PT Long Term Goals - 02/20/15 1517    PT LONG TERM GOAL #1   Title ability to exercise in the pool for 2 hours without having to get out to urinate   Time 12   Period Weeks   Status Achieved   PT LONG TERM GOAL #2   Title ability to hold her urine 5 min when she has the urge to void   Time 12   Period Weeks   Status Achieved   PT LONG TERM GOAL #3   Title pelvic floor strength >/= 3/5 holding for 10 seconds   Time 12   Period Weeks   Status On-going   PT LONG TERM GOAL #4   Title wait three hours before she has to urinate due to increased strength   Time 12   Period Weeks   Status Achieved   PT LONG TERM GOAL #5   Title independent with HEP and understands how to progress herself   Time 12   Period Weeks   Status On-going   Additional Long Term Goals   Additional Long Term Goals Yes   PT LONG TERM GOAL #6   Title pain in left lower quadrant decreased >/= 75% while exercising in the pool   Time 12   Period Weeks   Status New               Plan - 02/20/15 1529    Clinical Impression Statement 54 year old female with diagnosis of pelvic floor issues.  Patient reports her pain has decreased by 90%. She has the pain in left quadrant especially when she flexes her left hip in the pool. Patient reports a burning pain at level 6/10 in left lower quadrant. Patient is having back pain and may have  injections. Pelvic floor strength is 3/5  holding for 8 seconds. Patient is able to exercise in the pool for 2 hours  without having to get out to urinate.  Patient able to hold her urine for 5 min. when she has the urge to void. Patient can wait three hours before she has to urinate. Patient would benefit from physical therapy to reduce pain with exercise and increase pelvic floor strength.    Pt will benefit from skilled therapeutic intervention in order to improve on the following deficits Decreased activity tolerance;Decreased strength;Decreased scar mobility;Pain;Increased muscle spasms;Decreased endurance;Increased fascial restricitons   Rehab Potential Excellent   Clinical Impairments Affecting Rehab Potential None   PT Frequency 1x / week   PT Duration 12 weeks   PT Treatment/Interventions ADLs/Self Care Home Management;Cryotherapy;Electrical Stimulation;Biofeedback;Ultrasound;Moist Heat;Therapeutic activities;Therapeutic exercise;Neuromuscular re-education;Manual techniques;Patient/family education;Scar mobilization;Passive range of motion   PT Next Visit Plan internal soft tissue work ; discuss yoga   PT Home Exercise Plan progress as needed   Consulted and Agree with Plan of Care Patient        Problem List Patient Active Problem List   Diagnosis Date Noted  . Chronic pain 05/08/2012  . Muscle spasm of back 05/08/2012  . Chest pain 05/08/2012  . Gastroparesis 05/22/2011  . Impaired glucose tolerance 05/18/2011  . Preventative health care 05/18/2011  . HYPERSOMNIA 04/04/2010  . GERD 01/16/2009  . IBS 01/16/2009  . 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 ANEMIA-IRON DEFICIENCY 01/03/2007  . ANXIETY 01/03/2007  . DEPRESSION 01/03/2007    Kawon Willcutt,PT 02/20/2015, 4:31 PM  [facility] [address], [address] [place], [place], [address] Phone: [phone]   Fax:  [phone]

## 2015-03-02 ENCOUNTER — Ambulatory Visit: Payer: 59 | Admitting: Physical Therapy

## 2015-03-07 ENCOUNTER — Encounter: Payer: Self-pay | Admitting: Physical Therapy

## 2015-03-07 ENCOUNTER — Ambulatory Visit: Payer: 59 | Attending: Obstetrics and Gynecology | Admitting: Physical Therapy

## 2015-03-07 DIAGNOSIS — M6289 Other specified disorders of muscle: Secondary | ICD-10-CM

## 2015-03-07 DIAGNOSIS — N8184 Pelvic muscle wasting: Secondary | ICD-10-CM | POA: Insufficient documentation

## 2015-03-07 NOTE — Therapy (Signed)
[facility] [address] [place], [place], [address] Phone: [phone]   Fax:  [phone]  Physical Therapy Treatment  Patient Details  Name: [name] MRN: [id number] Date of Birth: 08-04-1960 Referring Provider:  Molli Posey, MD  Encounter Date: 03/07/2015      PT End of Session - 03/07/15 1446    Visit Number 10   Date for PT Re-Evaluation 03/29/15   PT Start Time 6389   PT Stop Time 1525   PT Time Calculation (min) 40 min   Activity Tolerance Patient tolerated treatment well   Behavior During Therapy [facility] for tasks assessed/performed      Past Medical History  Diagnosis Date  . Hyperplastic colon polyp     04/2010  . GERD (gastroesophageal reflux disease)   . Anemia   . Anxiety   . Depression   . Obesity   . Migraine   . DJD (degenerative joint disease)     knee  . IBS (irritable bowel syndrome)   . Menopause     early  . Impaired glucose tolerance 05/18/2011  . ANEMIA-IRON DEFICIENCY 01/03/2007  . ANXIETY 01/03/2007  . DEPRESSION 01/03/2007  . GERD 01/16/2009  . Headache(784.0) 01/03/2007  . HYPERSOMNIA 04/04/2010  . IBS 01/16/2009  . 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 Wheezing 07/02/2010    Past Surgical History  Procedure Laterality Date  . Cholecystectomy    . Appendectomy    . Bladder tac    . Abdominal hysterectomy    . Knee arthroscopy    . Oophorectomy      There were no vitals filed for this visit.  Visit Diagnosis:  Pelvic floor dysfunction      Subjective Assessment - 03/07/15 1446    Subjective I had a steroid shot 2 weeks ago.  Still has pain in right hip and spasms in left lumbar. Patient reports pelvic area is 90% better.    Patient Stated Goals reduce the amount she has urinated   Currently in Pain? Yes   Pain Score 2    Pain Location Abdomen   Pain Orientation Left   Pain Descriptors / Indicators Tightness   Pain Type Chronic pain   Pain  Onset More than a month ago   Pain Frequency Intermittent   Aggravating Factors  stress, sick   Pain Relieving Factors swimming   Multiple Pain Sites No            [facility] PT Assessment - 03/07/15 0001    Assessment   Medical Diagnosis Pelvic floor issues   Onset Date/Surgical Date 06/25/11   Prior Therapy None   Precautions   Precautions None   Balance Screen   Has the patient fallen in the past 6 months No   Has the patient had a decrease in activity level because of a fear of falling?  No   Is the patient reluctant to leave their home because of a fear of falling?  No   Prior Function   Level of Independence Independent   Leisure pool aerobics   Cognition   Overall Cognitive Status Within Functional Limits for tasks assessed   Observation/Other Assessments   Focus on Therapeutic Outcomes (FOTO)  5% limitation   Strength   Overall Strength Comments abdominal strength is 3/5; right hip extension 5/5   Palpation   Palpation comment pelvis in correct alignment  Pelvic Floor Special Questions - 03/07/15 0001    Pelvic Floor Internal Exam Patient confirms identification and approves physical therapist to assess pelvic muscle strength and integrity   Exam Type Vaginal   Strength fair squeeze, definite lift   Strength # of seconds 10           [facility] Adult PT Treatment/Exercise - 03/07/15 0001    Therapeutic Activites    Therapeutic Activities Lifting;ADL's   ADL's standing, sitting, getting in and out of bed and car   Lifting lift from floor   Manual Therapy   Manual Therapy Soft tissue mobilization;Myofascial release;Internal Pelvic Floor   Internal Pelvic Floor left piriformis, bil. obturator internist, left side of puborectalis, left iliococcygeus and left side of rectum                PT Education - 03/07/15 1524    Education provided Yes   Education Details education on correct body mechanics with daily tasks; Reviewed past  HEP and patient able to perform correctly   Person(s) Educated Patient   Methods Explanation;Verbal cues;Handout   Comprehension Verbalized understanding          PT Short Term Goals - 03/07/15 1509    PT SHORT TERM GOAL #1   Title urge to void was educated on    Time 4   Period Weeks   Status Achieved   PT SHORT TERM GOAL #2   Title understand what bladder irritants are and how they affect the bladder   Time 4   Period Weeks   Status Achieved   PT SHORT TERM GOAL #3   Title ability to contract the pelvic floor with a lift   Time 4   Period Weeks   Status Achieved           PT Long Term Goals - 03/07/15 1452    PT LONG TERM GOAL #1   Title ability to exercise in the pool for 2 hours without having to get out to urinate   Time 12   Period Weeks   Status Achieved   PT LONG TERM GOAL #2   Title ability to hold her urine 5 min when she has the urge to void   Time 12   Period Weeks   Status Achieved   PT LONG TERM GOAL #3   Title pelvic floor strength >/= 3/5 holding for 10 seconds   Time 12   Period Weeks   Status Achieved   PT LONG TERM GOAL #4   Title wait three hours before she has to urinate due to increased strength   Time 12   Period Weeks   Status Achieved   PT LONG TERM GOAL #5   Title independent with HEP and understands how to progress herself   Time 12   Period Weeks   Status Achieved   PT LONG TERM GOAL #6   Title pain in left lower quadrant decreased >/= 75% while exercising in the pool   Time 12   Period Weeks   Status Achieved               Plan - 03/07/15 1511    Clinical Impression Statement Patient is a 54 year old female with diagnosis of pelvic floor issues.  Patient has met all of her goals.  Pelvic floor pain has decreased by 95%.  Patient is able to do her pool exercises without having to go to the bathroom.  Patient is able to contract  pelvic floor for 10 second at strength of 3/5. Patient is being discharged due to  achieving all goals.    Pt will benefit from skilled therapeutic intervention in order to improve on the following deficits Decreased activity tolerance;Decreased strength;Decreased scar mobility;Pain;Increased muscle spasms;Decreased endurance;Increased fascial restricitons   Clinical Impairments Affecting Rehab Potential None   PT Treatment/Interventions ADLs/Self Care Home Management;Cryotherapy;Electrical Stimulation;Biofeedback;Ultrasound;Moist Heat;Therapeutic activities;Therapeutic exercise;Neuromuscular re-education;Manual techniques;Patient/family education;Scar mobilization;Passive range of motion   PT Next Visit Plan Discharge to HEP   PT Home Exercise Plan progress as needed   Consulted and Agree with Plan of Care Patient        Problem List Patient Active Problem List   Diagnosis Date Noted  . Chronic pain 05/08/2012  . Muscle spasm of back 05/08/2012  . Chest pain 05/08/2012  . Gastroparesis 05/22/2011  . Impaired glucose tolerance 05/18/2011  . Preventative health care 05/18/2011  . HYPERSOMNIA 04/04/2010  . GERD 01/16/2009  . IBS 01/16/2009  . 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 ANEMIA-IRON DEFICIENCY 01/03/2007  . ANXIETY 01/03/2007  . DEPRESSION 01/03/2007    Nafisa Olds,PT 03/07/2015, 3:29 PM  [facility] [address], [place], [place], [address] Phone: [phone]   Fax:  [phone]   PHYSICAL THERAPY DISCHARGE SUMMARY  Visits from Start of Care: 10  Current functional level related to goals / functional outcomes: See above.   Remaining deficits: See above.  Patient is having back pain and would like to have physical therapy.    Education / Equipment: HEP Plan: Patient agrees to discharge.  Patient goals were met. Patient is being discharged due to meeting the stated rehab goals.  Thank you for the referral.  Earlie Counts, PT 03/07/2015 3:30 PM  ?????

## 2015-03-07 NOTE — Patient Instructions (Signed)
Posture - Sitting   Sit upright, head facing forward. Try using a roll to support lower back. Keep shoulders relaxed, and avoid rounded back. Keep hips level with knees. Avoid crossing legs for long periods.   Copyright  VHI. All rights reserved.  Sleeping on Side   Place pillow between knees. Use cervical support under neck and a roll around waist as needed.   Copyright  VHI. All rights reserved.  Standing   For prolonged standing, alternate placing one foot in front of the other or on a stool. Wear low-heeled shoes, and maintain good posture.   Copyright  VHI. All rights reserved.  Alternating Positions   Alternate tasks and change positions frequently to reduce fatigue and muscle tension. Take rest breaks.   Copyright  VHI. All rights reserved.  Getting Into / Out of Bed   Lower self to lie down on one side by raising legs and lowering head at the same time. Use arms to assist moving without twisting. Bend both knees to roll onto back if desired. To sit up, start from lying on side, and use same move-ments in reverse. Keep trunk aligned with legs.   Copyright  VHI. All rights reserved.  Getting Into / Out of Car   Lower self onto seat, scoot back, then bring in one leg at a time. Reverse sequence to get out.   Copyright  VHI. All rights reserved.  Avoid Twisting   Avoid twisting or bending back. Pivot around using foot movements, and bend at knees if needed when reaching for articles.   Copyright  VHI. All rights reserved.  Stand to Sit / Sit to Stand   To sit: Bend knees to lower self onto front edge of chair, then scoot back on seat. To stand: Reverse sequence by placing one foot forward, and scoot to front of seat. Use rocking motion to stand up.  Copyright  VHI. All rights reserved.   [facility] [address], [address] [place], [place] [address] Phone # [phone] Fax [phone]

## 2015-03-14 ENCOUNTER — Ambulatory Visit: Payer: 59 | Admitting: Physical Therapy

## 2015-03-21 ENCOUNTER — Ambulatory Visit: Payer: 59 | Admitting: Physical Therapy

## 2015-03-24 ENCOUNTER — Ambulatory Visit (INDEPENDENT_AMBULATORY_CARE_PROVIDER_SITE_OTHER): Payer: 59

## 2015-03-24 DIAGNOSIS — Z23 Encounter for immunization: Secondary | ICD-10-CM

## 2015-04-10 ENCOUNTER — Ambulatory Visit (INDEPENDENT_AMBULATORY_CARE_PROVIDER_SITE_OTHER): Payer: 59 | Admitting: Physician Assistant

## 2015-04-10 VITALS — BP 160/98 | HR 100 | Temp 97.7°F | Resp 20 | Ht 66.0 in | Wt 249.0 lb

## 2015-04-10 DIAGNOSIS — I10 Essential (primary) hypertension: Secondary | ICD-10-CM | POA: Insufficient documentation

## 2015-04-10 LAB — BASIC METABOLIC PANEL
BUN: 11 mg/dL (ref 7–25)
CO2: 23 mmol/L (ref 20–31)
CREATININE: 0.73 mg/dL (ref 0.50–1.05)
Calcium: 9.6 mg/dL (ref 8.6–10.4)
Chloride: 105 mmol/L (ref 98–110)
Glucose, Bld: 114 mg/dL — ABNORMAL HIGH (ref 65–99)
Potassium: 3.9 mmol/L (ref 3.5–5.3)
Sodium: 140 mmol/L (ref 135–146)

## 2015-04-10 LAB — TSH: TSH: 1.08 u[IU]/mL (ref 0.350–4.500)

## 2015-04-10 LAB — POCT GLYCOSYLATED HEMOGLOBIN (HGB A1C): HEMOGLOBIN A1C: 6.1

## 2015-04-10 LAB — HEMOGLOBIN A1C: Hgb A1c MFr Bld: 6.1 % — AB (ref 4.0–6.0)

## 2015-04-10 LAB — GLUCOSE, POCT (MANUAL RESULT ENTRY): POC GLUCOSE: 110 mg/dL — AB (ref 70–99)

## 2015-04-10 MED ORDER — HYDROCHLOROTHIAZIDE 12.5 MG PO CAPS: 12.5000 mg | ORAL_CAPSULE | Freq: Every day | ORAL | Status: DC

## 2015-04-10 NOTE — Progress Notes (Signed)
Subjective:    Patient ID: [name], female    DOB: 23-Apr-1961, 54 y.o.   MRN: [id number]  HPI Patient presents for elevated blood pressure and states that it has never been this high. Says that BP typically in 120s. Says that this morning had HA and dizziness that have resolved after taking advil. Denies SOB, CP, change in vision, edema, or palpitations. Is not following any particular diet and know that her weakness is junk food. Husband adds that she eats a lot of cereal. Has not been exercising for this past month which she goes to the pool and does water exercises due to bilateral knee and back problems. This past month she has been trying to sell her house and she has not been sleeping well. No h/o HTN, CHF, MI, or stroke.  Allergies  Allergen Reactions  . Morphine Anaphylaxis  . Sucralfate Other (See Comments)    Blurry vision  . Azithromycin Nausea And Vomiting  . Codeine Nausea Only and Other (See Comments)    Hot flashes also  . Penicillins Hives, Nausea Only and Swelling  . Amoxicillin Nausea Only  . Hydrocodone Itching, Nausea Only and Rash  . Omeprazole Itching and Rash  . Oxycodone-Acetaminophen Nausea Only  . Tramadol Itching, Nausea Only and Rash   Review of Systems  Constitutional: Positive for diaphoresis and fatigue. Negative for fever, chills, activity change, appetite change and unexpected weight change.  Eyes: Negative for visual disturbance.  Respiratory: Negative for shortness of breath.   Cardiovascular: Negative for chest pain, palpitations and leg swelling.  Gastrointestinal: Negative for nausea and vomiting.  Musculoskeletal: Positive for back pain (baseline) and arthralgias (baseline).  Neurological: Positive for dizziness (resolved.) and headaches (resolved). Negative for weakness.       Objective:   Physical Exam  Constitutional: She is oriented to person, place, and time. She appears well-developed and well-nourished. No distress.  Blood  pressure 158/90, pulse 100, temperature 97.7 F (36.5 C), temperature source Oral, resp. rate 20, height  (1.676 m), weight 249 lb (112.946 kg), SpO2 98 %.   HENT:  Head: Normocephalic and atraumatic.  Right Ear: External ear normal.  Left Ear: External ear normal.  Eyes: Conjunctivae are normal. Right eye exhibits no discharge. Left eye exhibits no discharge. No scleral icterus.  Neck: Neck supple. No JVD present. Carotid bruit is not present. No thyromegaly present.  Cardiovascular: Normal rate, regular rhythm, normal heart sounds and intact distal pulses.  Exam reveals no gallop and no friction rub.   No murmur heard. Pulmonary/Chest: Effort normal and breath sounds normal. No respiratory distress. She has no decreased breath sounds. She has no wheezes. She has no rhonchi. She has no rales. She exhibits no tenderness.  Abdominal: Soft. Bowel sounds are normal. She exhibits no distension and no mass. There is no tenderness. There is no rebound and no guarding.  Musculoskeletal: She exhibits edema (1+ pittle edema bilateral).  Lymphadenopathy:    She has no cervical adenopathy.  Neurological: She is alert and oriented to person, place, and time.  Skin: She is not diaphoretic.  Psychiatric: She has a normal mood and affect. Her behavior is normal. Judgment and thought content normal.   Results for orders placed or performed in visit on 04/10/15  POCT glucose (manual entry)  Result Value Ref Range   POC Glucose 110 (A) 70 - 99 mg/dl  POCT glycosylated hemoglobin (Hb A1C)  Result Value Ref Range   Hemoglobin A1C 6.1  Assessment & Plan:  1. Essential hypertension RTC 05/11/15 for BP follow up. Spoke extensively about diet and decided that will work on cutting back on ice cream and candy by next visit. Will also return to pool for exercise. Discussed med side effects. Patient states that she does not want to take medications and would like to work towards not taking HCTZ at  some point. Advised her that that will be completely dependent on how much work she puts in to losing weight and diet and exercise.  - POCT glucose (manual entry) - POCT glycosylated hemoglobin (Hb A1C) - Basic metabolic panel - TSH - hydrochlorothiazide (MICROZIDE) 12.5 MG capsule; Take 1 capsule (12.5 mg total) by mouth daily.  Dispense: 30 capsule; Refill: 1  Note: Does not want to be told that she is obese as she is already aware and is looking for a new PCP bc feels like that is all her current PCP talks about.   Janan Ridgeishira Reagen Haberman PA-C  [facility] 04/10/2015 5:38 PM

## 2015-04-10 NOTE — Patient Instructions (Signed)
DASH Eating Plan  DASH stands for "Dietary Approaches to Stop Hypertension." The DASH eating plan is a healthy eating plan that has been shown to reduce high blood pressure (hypertension). Additional health benefits may include reducing the risk of type 2 diabetes mellitus, heart disease, and stroke. The DASH eating plan may also help with weight loss.  WHAT DO I NEED TO KNOW ABOUT THE DASH EATING PLAN?  For the DASH eating plan, you will follow these general guidelines:  · Choose foods with a percent daily value for sodium of less than 5% (as listed on the food label).  · Use salt-free seasonings or herbs instead of table salt or sea salt.  · Check with your health care provider or pharmacist before using salt substitutes.  · Eat lower-sodium products, often labeled as "lower sodium" or "no salt added."  · Eat fresh foods.  · Eat more vegetables, fruits, and low-fat dairy products.  · Choose whole grains. Look for the word "whole" as the first word in the ingredient list.  · Choose fish and skinless chicken or turkey more often than red meat. Limit fish, poultry, and meat to 6 oz (170 g) each day.  · Limit sweets, desserts, sugars, and sugary drinks.  · Choose heart-healthy fats.  · Limit cheese to 1 oz (28 g) per day.  · Eat more home-cooked food and less restaurant, buffet, and fast food.  · Limit fried foods.  · Cook foods using methods other than frying.  · Limit canned vegetables. If you do use them, rinse them well to decrease the sodium.  · When eating at a restaurant, ask that your food be prepared with less salt, or no salt if possible.  WHAT FOODS CAN I EAT?  Seek help from a dietitian for individual calorie needs.  Grains  Whole grain or whole wheat bread. Brown rice. Whole grain or whole wheat pasta. Quinoa, bulgur, and whole grain cereals. Low-sodium cereals. Corn or whole wheat flour tortillas. Whole grain cornbread. Whole grain crackers. Low-sodium crackers.  Vegetables  Fresh or frozen vegetables  (raw, steamed, roasted, or grilled). Low-sodium or reduced-sodium tomato and vegetable juices. Low-sodium or reduced-sodium tomato sauce and paste. Low-sodium or reduced-sodium canned vegetables.   Fruits  All fresh, canned (in natural juice), or frozen fruits.  Meat and Other Protein Products  Ground beef (85% or leaner), grass-fed beef, or beef trimmed of fat. Skinless chicken or turkey. Ground chicken or turkey. Pork trimmed of fat. All fish and seafood. Eggs. Dried beans, peas, or lentils. Unsalted nuts and seeds. Unsalted canned beans.  Dairy  Low-fat dairy products, such as skim or 1% milk, 2% or reduced-fat cheeses, low-fat ricotta or cottage cheese, or plain low-fat yogurt. Low-sodium or reduced-sodium cheeses.  Fats and Oils  Tub margarines without trans fats. Light or reduced-fat mayonnaise and salad dressings (reduced sodium). Avocado. Safflower, olive, or canola oils. Natural peanut or almond butter.  Other  Unsalted popcorn and pretzels.  The items listed above may not be a complete list of recommended foods or beverages. Contact your dietitian for more options.  WHAT FOODS ARE NOT RECOMMENDED?  Grains  White bread. White pasta. White rice. Refined cornbread. Bagels and croissants. Crackers that contain trans fat.  Vegetables  Creamed or fried vegetables. Vegetables in a cheese sauce. Regular canned vegetables. Regular canned tomato sauce and paste. Regular tomato and vegetable juices.  Fruits  Dried fruits. Canned fruit in light or heavy syrup. Fruit juice.  Meat and Other Protein   Products  Fatty cuts of meat. Ribs, chicken wings, bacon, sausage, bologna, salami, chitterlings, fatback, hot dogs, bratwurst, and packaged luncheon meats. Salted nuts and seeds. Canned beans with salt.  Dairy  Whole or 2% milk, cream, half-and-half, and cream cheese. Whole-fat or sweetened yogurt. Full-fat cheeses or blue cheese. Nondairy creamers and whipped toppings. Processed cheese, cheese spreads, or cheese  curds.  Condiments  Onion and garlic salt, seasoned salt, table salt, and sea salt. Canned and packaged gravies. Worcestershire sauce. Tartar sauce. Barbecue sauce. Teriyaki sauce. Soy sauce, including reduced sodium. Steak sauce. Fish sauce. Oyster sauce. Cocktail sauce. Horseradish. Ketchup and mustard. Meat flavorings and tenderizers. Bouillon cubes. Hot sauce. Tabasco sauce. Marinades. Taco seasonings. Relishes.  Fats and Oils  Butter, stick margarine, lard, shortening, ghee, and bacon fat. Coconut, palm kernel, or palm oils. Regular salad dressings.  Other  Pickles and olives. Salted popcorn and pretzels.  The items listed above may not be a complete list of foods and beverages to avoid. Contact your dietitian for more information.  WHERE CAN I FIND MORE INFORMATION?  National Heart, Lung, and Blood Institute: www.nhlbi.nih.gov/health/health-topics/topics/dash/     This information is not intended to replace advice given to you by your health care provider. Make sure you discuss any questions you have with your health care provider.     Document Released: 05/30/2011 Document Revised: 07/01/2014 Document Reviewed: 04/14/2013  Elsevier Interactive Patient Education ©2016 [employer]

## 2015-04-12 ENCOUNTER — Encounter: Payer: Self-pay | Admitting: Physician Assistant

## 2015-04-14 ENCOUNTER — Telehealth: Payer: Self-pay | Admitting: Internal Medicine

## 2015-04-14 NOTE — Telephone Encounter (Signed)
Patient Name: [name] DOB: 01/21/1961 Initial Comment [facility]. Given hydrochlorothiazide. Has taken it every day, still sweating, hot, feels heart racing, does not have a way to check bp. Nurse Assessment Nurse: Stacey Birkaldwell, RN, [name] Date/Time (Eastern Time): 04/14/2015 10:56:52 AM Confirm and document reason for call. If symptomatic, describe symptoms. ---Caller [facility] and pulse. Given Hydrochlorothiazide 12.5 mg once a day to take for a month. Has taken it every day, still sweating, hot, dizzy, feels heart racing, does not have a way to check BP. Pulse was 100, BP 158/90 on Monday. Also, felt nausea when she starts sweating. Went through menopause years ago, still on hormone therapy. Her EKG's come back abnormal every time they check, Dr. is not concerned about it. Has the patient traveled out of the country within the last 30 days? ---Not Applicable Does the patient have any new or worsening symptoms? ---Yes Will a triage be completed? ---Yes Related visit to physician within the last 2 weeks? ---Yes Does the PT have any chronic conditions? (i.e. diabetes, asthma, etc.) ---Yes List chronic conditions. ---acid reflux, food digestive issues Guidelines Guideline Title Affirmed Question Affirmed Notes Dizziness - Lightheadedness [1] MODERATE dizziness (e.g., interferes with normal activities) AND [2] has NOT been evaluated by physician for this (Exception: dizziness caused by heat exposure, sudden standing, or poor fluid intake) Final Disposition User See Physician within 24 Hours Amagansettaldwell, RN, Stark BrayLynda Comments Please contact caller at this # or her cell # if there any cancellations today so she can be seen, or tomorrow at the clinic. Triaged for dizziness, but her other symptoms come & go. Referrals REFERRED TO PCP OFFICE Disagree/Comply: Comply

## 2015-04-15 ENCOUNTER — Encounter: Payer: Self-pay | Admitting: Family Medicine

## 2015-04-15 ENCOUNTER — Ambulatory Visit (INDEPENDENT_AMBULATORY_CARE_PROVIDER_SITE_OTHER): Payer: 59 | Admitting: Family Medicine

## 2015-04-15 VITALS — BP 152/90 | HR 94 | Temp 98.2°F | Resp 20 | Ht 66.0 in | Wt 245.2 lb

## 2015-04-15 DIAGNOSIS — I10 Essential (primary) hypertension: Secondary | ICD-10-CM | POA: Diagnosis not present

## 2015-04-15 DIAGNOSIS — R42 Dizziness and giddiness: Secondary | ICD-10-CM

## 2015-04-15 DIAGNOSIS — R61 Generalized hyperhidrosis: Secondary | ICD-10-CM

## 2015-04-15 DIAGNOSIS — R0609 Other forms of dyspnea: Secondary | ICD-10-CM

## 2015-04-15 NOTE — Assessment & Plan Note (Addendum)
New problem. Patient with lightheadedness, dyspnea on exertion, and diaphoresis. I do not feel that her elevated blood pressure fully explains her current symptomatology. Given her symptoms and prior history of chest pain, I recommend cardiology evaluation. Referral placed.

## 2015-04-15 NOTE — Progress Notes (Signed)
Pre visit review using our clinic review tool, if applicable. No additional management support is needed unless otherwise documented below in the visit note. 

## 2015-04-15 NOTE — Assessment & Plan Note (Signed)
BP mildly elevated today. Patient recent started on treatment. Will not augment today

## 2015-04-15 NOTE — Addendum Note (Signed)
Addended by: Tommie SamsOOK, Hortense Cantrall G on: 04/15/2015 12:21 PM   Modules accepted: Level of Service, SmartSet

## 2015-04-15 NOTE — Progress Notes (Addendum)
Subjective:  Patient ID: Stacey BombardSylvia B [name], female    DOB: 07/19/1960  Age: 54 y.o. MRN: [id number]  CC: Elevated blood pressure, dizziness, shortness of breath  HPI:  54 year old female with a past medical history of recently diagnosed hypertension, anxiety/depression, obesity, and prediabetes presents to clinic with the above complaints.  Patient reports that she has been experiencing lightheadedness, dizziness, and shortness of breath with exertion. She reports that this began this past Monday. [facility] Her blood pressure was elevated and she started on treatment for hypertension at that time with HCTZ.  She states that her symptoms have continued to persist despite being treated for hypertension. She states that she feels intermittently hot and sweaty with associated lightheadedness (feeling as if she going to pass out). Additionally she's had some associated nausea. She's also had dyspnea with exertion and has had trouble completing her normal everyday tasks. She denies any associated chest pain. No associated fevers or chills.  Social Hx   Social History   Social History  . Marital Status: Married    Spouse Name: N/A  . Number of Children: 2  . Years of Education: N/A   Occupational History  . Housewife    Social History Main Topics  . Smoking status: Never Smoker   . Smokeless tobacco: Never Used  . Alcohol Use: No  . Drug Use: No  . Sexual Activity: No   Other Topics Concern  . None   Social History Narrative   Review of Systems  Constitutional: Positive for diaphoresis.  Respiratory: Positive for shortness of breath.   Neurological: Positive for dizziness, light-headedness and headaches.    Objective:  BP 152/90 mmHg  Pulse 94  Temp(Src) 98.2 F (36.8 C) (Oral)  Resp 20  Ht 5\' 6"  (1.676 m)  Wt 245 lb 4 oz (111.245 kg)  BMI 39.60 kg/m2  SpO2 97%  BP/Weight 04/15/2015 04/10/2015 01/27/2015  Systolic BP 152 160  138  Diastolic BP 90 98 80  Wt. (Lbs) 245.25 249 243  BMI 39.6 40.21 39.24   Physical Exam  Constitutional: She appears well-developed. No distress.  HENT:  Head: Normocephalic.  Mouth/Throat: Oropharynx is clear and moist. No oropharyngeal exudate.  Cardiovascular: Normal rate and regular rhythm.   No murmur heard. Pulmonary/Chest: Effort normal and breath sounds normal. No respiratory distress.  Abdominal: Soft. She exhibits no distension. There is no tenderness.  Neurological: She is alert.  Psychiatric:  Flat affect.  Vitals reviewed.  Lab Results  Component Value Date   WBC 6.3 01/19/2015   HGB 13.4 01/19/2015   HCT 39.2 01/19/2015   PLT 209.0 01/19/2015   GLUCOSE 114* 04/10/2015   CHOL 153 01/19/2015   TRIG 312.0* 01/19/2015   HDL 42.20 01/19/2015   LDLDIRECT 61.0 01/19/2015   LDLCALC 96 04/02/2010   ALT 13 01/19/2015   AST 13 01/19/2015   NA 140 04/10/2015   K 3.9 04/10/2015   CL 105 04/10/2015   CREATININE 0.73 04/10/2015   BUN 11 04/10/2015   CO2 23 04/10/2015   TSH 1.080 04/10/2015   HGBA1C 6.1 04/10/2015   Assessment & Plan:   Problem List Items Addressed This Visit    Dyspnea on exertion - Primary    New problem. Patient with lightheadedness, dyspnea on exertion, and diaphoresis. I do not feel that her elevated blood pressure fully explains her current symptomatology. Given her symptoms and prior history of chest pain, I recommend cardiology evaluation. Referral placed.  Relevant Orders   Ambulatory referral to Cardiology   Pro b natriuretic peptide   Essential hypertension    BP mildly elevated today. Patient recent started on treatment. Will not augment today       Other Visit Diagnoses    Diaphoresis        Relevant Orders    Ambulatory referral to Cardiology    Pro b natriuretic peptide    Lightheadedness          Follow-up: Soon with PCP  Everlene Other, DO

## 2015-04-15 NOTE — Patient Instructions (Signed)
It was nice to see you today.  Continue your current medications.  They will be in touch regarding your referral.  Take it easy until then.  Take care   Dr. Adriana Simasook

## 2015-04-17 ENCOUNTER — Other Ambulatory Visit: Payer: 59

## 2015-04-17 ENCOUNTER — Other Ambulatory Visit: Payer: Self-pay | Admitting: Family Medicine

## 2015-04-17 DIAGNOSIS — R61 Generalized hyperhidrosis: Secondary | ICD-10-CM

## 2015-04-17 DIAGNOSIS — R0609 Other forms of dyspnea: Secondary | ICD-10-CM

## 2015-04-18 LAB — PRO B NATRIURETIC PEPTIDE: Pro B Natriuretic peptide (BNP): 17.05 pg/mL (ref <126)

## 2015-04-21 ENCOUNTER — Encounter: Payer: Self-pay | Admitting: Family Medicine

## 2015-04-24 ENCOUNTER — Telehealth: Payer: Self-pay | Admitting: Internal Medicine

## 2015-04-24 NOTE — Telephone Encounter (Signed)
Pt request lab result that was done. Please give her a call back  Phone # [phone]

## 2015-04-25 NOTE — Telephone Encounter (Signed)
BNP ordered by Dr Adriana Simasook at Saturday clinic, please advise

## 2015-04-25 NOTE — Telephone Encounter (Signed)
Patient called to follow up. It appears that we have not received the results back from [facility] in the system? Please advise patient how she should proceed.

## 2015-04-26 NOTE — Telephone Encounter (Signed)
Pt advised.

## 2015-04-26 NOTE — Telephone Encounter (Signed)
BNP was normal at 17.05, no change in tx needed  thanks

## 2015-05-02 ENCOUNTER — Encounter: Payer: Self-pay | Admitting: Cardiology

## 2015-05-08 ENCOUNTER — Ambulatory Visit: Payer: 59 | Admitting: Cardiology

## 2015-05-12 ENCOUNTER — Encounter: Payer: Self-pay | Admitting: Cardiovascular Disease

## 2015-05-12 ENCOUNTER — Ambulatory Visit (INDEPENDENT_AMBULATORY_CARE_PROVIDER_SITE_OTHER): Payer: 59

## 2015-05-12 ENCOUNTER — Ambulatory Visit (INDEPENDENT_AMBULATORY_CARE_PROVIDER_SITE_OTHER): Payer: 59 | Admitting: Cardiovascular Disease

## 2015-05-12 VITALS — BP 134/88 | HR 78 | Ht 66.0 in | Wt 249.7 lb

## 2015-05-12 DIAGNOSIS — R002 Palpitations: Secondary | ICD-10-CM

## 2015-05-12 DIAGNOSIS — R232 Flushing: Secondary | ICD-10-CM | POA: Diagnosis not present

## 2015-05-12 DIAGNOSIS — I10 Essential (primary) hypertension: Secondary | ICD-10-CM | POA: Diagnosis not present

## 2015-05-12 DIAGNOSIS — R55 Syncope and collapse: Secondary | ICD-10-CM

## 2015-05-12 DIAGNOSIS — R197 Diarrhea, unspecified: Secondary | ICD-10-CM

## 2015-05-12 DIAGNOSIS — R0609 Other forms of dyspnea: Secondary | ICD-10-CM

## 2015-05-12 NOTE — Progress Notes (Signed)
Cardiology Office Note   Date:  05/13/2015   ID:  [name], DOB 1961-04-30, MRN [id number]  PCP:  Oliver Barre, MD  Cardiologist:   Madilyn Hook, MD   Chief Complaint  Patient presents with  . New Evaluation    patient reports dizziness, nausea, elevated heart rate, shortness of breath - very inconcsistent and usually with normal activity and at rest, "clam-y," "hot," fatigue      History of Present Illness: [name] is a 54 y.o. female with hypertension, gastroesophageal reflux disease and depression who presents for an evaluation of shortness of breath.  She was evaluated by Dr. Everlene Other on 10/22 for a complaint of dizziness and dyspnea on exertion.  EKG showed sinus rhythm with low voltage and poor R wave progression.  BNP was 17.  She was referred to cardiology for further evaluation.  She reports difficulty breathing, dizziness and feeling as though her heart is beating out of her chest.  They're also associated with diarrhea and a feeling of flushing. The episodes started occurring this summer.  She noticed that she felt hot and clammy.  She felt much better after getting in the pool.  It has been occurring more frequently lately.  She notices it sometimes with minimal exertion, even after feeding her dog.  It always starts with shortness of breath followed by a feeling of being hot and nauseous.  The episodes last up to an entire day.  She does water aerobics and was doing it every day this summer.  She never has episodes with this activity.    She recently went to Urgent care reporting these symptoms and was noted to have hypertension, which was a new diagnosis.  Her blood pressure was 158/90 at that appointment.  She was started on HCTZ but continues to have lower extremity edema.  She has been seen by [facility] and was told that she has varicose veins.  She wakes ups gasping for air.  She feels like someone is smothering her.  She doesn't feel  well-rested in the mornings.   Past Medical History  Diagnosis Date  . Hyperplastic colon polyp     04/2010  . GERD (gastroesophageal reflux disease)   . Anemia   . Anxiety   . Depression   . Obesity   . Migraine   . DJD (degenerative joint disease)     knee  . IBS (irritable bowel syndrome)   . Menopause     early  . Impaired glucose tolerance 05/18/2011  . ANEMIA-IRON DEFICIENCY 01/03/2007  . ANXIETY 01/03/2007  . DEPRESSION 01/03/2007  . GERD 01/16/2009  . Headache(784.0) 01/03/2007  . HYPERSOMNIA 04/04/2010  . IBS 01/16/2009  . 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 Wheezing 07/02/2010    Past Surgical History  Procedure Laterality Date  . Cholecystectomy    . Appendectomy    . Bladder tac    . Abdominal hysterectomy    . Knee arthroscopy    . Oophorectomy       Current Outpatient Prescriptions  Medication Sig Dispense Refill  . esomeprazole (NEXIUM) 20 MG capsule Take 20 mg by mouth daily at 12 noon.    Marland Kitchen estradiol (ESTRACE) 2 MG tablet Take 2 mg by mouth every evening.    . gabapentin (NEURONTIN) 300 MG capsule Take 1 capsule by mouth daily as needed.    Marland Kitchen ibuprofen (ADVIL,MOTRIN) 200 MG tablet Take 400 mg by mouth every 6 (six) hours as needed for  pain.    . nystatin cream (MYCOSTATIN) Apply 1 application topically daily as needed.  1  . promethazine (PHENERGAN) 25 MG tablet Take 1 tablet (25 mg total) by mouth every 6 (six) hours as needed for nausea. 15 tablet 0  . ranitidine (ZANTAC) 300 MG capsule Take 300 mg by mouth daily as needed for heartburn.     . triamcinolone cream (KENALOG) 0.1 % Apply 1 application topically daily as needed.  1   No current facility-administered medications for this visit.    Allergies:   Meperidine; Morphine; Sucralfate; Azithromycin; Codeine; Oxycodone-acetaminophen; Penicillins; Amoxicillin; Hydrocodone; Omeprazole; Oxycodone-acetaminophen; and Tramadol    Social History:  The patient  reports that she has never  smoked. She has never used smokeless tobacco. She reports that she does not drink alcohol or use illicit drugs.   Family History:  The patient's family history includes Diabetes in her brother; Hypertension in her mother.    ROS:  Please see the history of present illness.   Otherwise, review of systems are positive for none.   All other systems are reviewed and negative.    PHYSICAL EXAM: VS:  BP 134/88 mmHg  Pulse 78  Ht 5\' 6"  (1.676 m)  Wt 113.263 kg (249 lb 11.2 oz)  BMI 40.32 kg/m2 , BMI Body mass index is 40.32 kg/(m^2). GENERAL:  Well appearing.  Face appears flushed. HEENT:  Pupils equal round and reactive, fundi not visualized, oral mucosa unremarkable NECK:  No jugular venous distention, waveform within normal limits, carotid upstroke brisk and symmetric, no bruits, no thyromegaly LYMPHATICS:  No cervical adenopathy LUNGS:  Clear to auscultation bilaterally HEART:  RRR.  PMI not displaced or sustained,S1 and S2 within normal limits, no S3, no S4, no clicks, no rubs, no murmurs ABD:  Flat, positive bowel sounds normal in frequency in pitch, no bruits, no rebound, no guarding, no midline pulsatile mass, no hepatomegaly, no splenomegaly EXT:  2 plus pulses throughout, no edema, no cyanosis no clubbing SKIN:  No rashes no nodules NEURO:  Cranial nerves II through XII grossly intact, motor grossly intact throughout PSYCH:  Cognitively intact, oriented to person place and time    EKG:  EKG is ordered today. The ekg ordered today demonstrates sinus rhythm rate 78 bpm. Inferior T wave abnormalities.   Recent Labs: 01/19/2015: ALT 13; Hemoglobin 13.4; Platelets 209.0 04/10/2015: BUN 11; Creat 0.73; Potassium 3.9; Sodium 140; TSH 1.080 04/17/2015: Pro B Natriuretic peptide (BNP) 17.05    Lipid Panel    Component Value Date/Time   CHOL 153 01/19/2015 0812   TRIG 312.0* 01/19/2015 0812   HDL 42.20 01/19/2015 0812   CHOLHDL 4 01/19/2015 0812   VLDL 62.4* 01/19/2015 0812    LDLCALC 96 04/02/2010 0850   LDLDIRECT 61.0 01/19/2015 0812      Wt Readings from Last 3 Encounters:  05/12/15 113.263 kg (249 lb 11.2 oz)  04/15/15 111.245 kg (245 lb 4 oz)  04/10/15 112.946 kg (249 lb)      ASSESSMENT AND PLAN:  # Dizziness: Ms. Nylund's dizziness sounds like neurocardiogenic pre-syncope, or vasovagal episodes.  However, there is no clear trigger.  We discussed the importance of adequate hydration, as she does not drink much throughout the day.  I also encouraged her to wear compression stockings.  Given that is also occurs sporadically without warning and along with flushing, diarrhea, new onset hypertension, and headache, we will also rule out pheochromocytoma and carcinoid. Check urine and serum metanephrine and urine 5-HIAA.  She  is not orthostatic in clinic today.  7 day Event Monitor for palpitations and dizziness.  Thyroid function was normal 04/10/15.  # Exertional dyspnea: Ms. Mayr dyspnea is concerning, as it could be an anginal equivalent.  We will obtain a Lexiscan Cardiolite. If this is unremarkable, will order an echo.     # Hypertension:  Blood pressure well-controlled on HCTZ.  Current medicines are reviewed at length with the patient today.  The patient does not have concerns regarding medicines.  The following changes have been made:  no change  Labs/ tests ordered today include:   Orders Placed This Encounter  Procedures  . Metanephrines, urine, 24 hour  . Metanephrines, plasma  . 5 HIAA w/Creatinine, 24 hr.  . Cardiac event monitor  . Myocardial Perfusion Imaging  . EKG 12-Lead     Disposition:   FU with Quenna Doepke C. Duke Salvia, MD in 1 month.   Signed, Madilyn Hook, MD  05/13/2015 6:34 AM    [facility]

## 2015-05-12 NOTE — Patient Instructions (Signed)
Medication Instructions:  Please continue your current medications  Labwork: Your physician recommends that you return for lab work TODAY.  Testing/Procedures: Your physician has recommended that you wear an event monitor. Event monitors are medical devices that record the heart's electrical activity. Doctors most often us these monitors to diagnose arrhythmias. Arrhythmias are problems with the speed or rhythm of the heartbeat. The monitor is a small, portable device. You can wear one while you do your normal daily activities. This is usually used to diagnose what is causing palpitations/syncope (passing out).  Your physician has requested that you have a lexiscan myoview. For further information please visit [email or web address]. Please follow instruction sheet, as given.  Follow-Up: Dr Duke Salviaandolph recommends that you schedule a follow-up appointment in 1 month.  If you need a refill on your cardiac medications before your next appointment, please call your pharmacy.   Your Doctor has ordered you to wear a heart monitor. You will wear this for 7 days.   TIPS -  REMINDERS 1. The sensor is the lanyard that is worn around your neck every day - this is powered by a battery that needs to be changed every day 2. The monitor is the device that allows you to record symptoms - this will need to be charged daily 3. The sensor & monitor need to be within 100 feet of each other at all times 4. The sensor connects to the electrodes (stickers) - these should be changed every 24-48 hours (you do not have to remove them when you bathe, just make sure they are dry when you connect it back to the sensor 5. If you need more supplies (electrodes, batteries), please call the 1-800 # on the back of the pamphlet and [employer] will mail you more supplies 6. If your skin becomes sensitive, please try the sample pack of sensitive skin electrodes (the white packet in your silver box) and call [employer] to have them mail  you more of these type of electrodes 7. When you are finish wearing the monitor, please place all supplies back in the silver box, place the silver box in the pre-packaged UPS bag and drop off at [employer] or call them so they can come pick it up   Cardiac Event Monitoring A cardiac event monitor is a small recording device used to help detect abnormal heart rhythms (arrhythmias). The monitor is used to record heart rhythm when noticeable symptoms such as the following occur:  Fast heartbeats (palpitations), such as heart racing or fluttering.  Dizziness.  Fainting or light-headedness.  Unexplained weakness. The monitor is wired to two electrodes placed on your chest. Electrodes are flat, sticky disks that attach to your skin. The monitor can be worn for up to 30 days. You will wear the monitor at all times, except when bathing.  HOW TO USE YOUR CARDIAC EVENT MONITOR A technician will prepare your chest for the electrode placement. The technician will show you how to place the electrodes, how to work the monitor, and how to replace the batteries. Take time to practice using the monitor before you leave the office. Make sure you understand how to send the information from the monitor to your health care provider. This requires a telephone with a landline, not a cell phone. You need to:  Wear your monitor at all times, except when you are in water:  Do not get the monitor wet.  Take the monitor off when bathing. Do not swim or use a hot tub with it  on.  Keep your skin clean. Do not put body lotion or moisturizer on your chest.  Change the electrodes daily or any time they stop sticking to your skin. You might need to use tape to keep them on.  It is possible that your skin under the electrodes could become irritated. To keep this from happening, try to put the electrodes in slightly different places on your chest. However, they must remain in the area under your left breast and in the upper right  section of your chest.  Make sure the monitor is safely clipped to your clothing or in a location close to your body that your health care provider recommends.  Press the button to record when you feel symptoms of heart trouble, such as dizziness, weakness, light-headedness, palpitations, thumping, shortness of breath, unexplained weakness, or a fluttering or racing heart. The monitor is always on and records what happened slightly before you pressed the button, so do not worry about being too late to get good information.  Keep a diary of your activities, such as walking, doing chores, and taking medicine. It is especially important to note what you were doing when you pushed the button to record your symptoms. This will help your health care provider determine what might be contributing to your symptoms. The information stored in your monitor will be reviewed by your health care provider alongside your diary entries.  Send the recorded information as recommended by your health care provider. It is important to understand that it will take some time for your health care provider to process the results.  Change the batteries as recommended by your health care provider. SEEK IMMEDIATE MEDICAL CARE IF:   You have chest pain.  You have extreme difficulty breathing or shortness of breath.  You develop a very fast heartbeat that persists.  You develop dizziness that does not go away.  You faint or constantly feel you are about to faint. Document Released: 03/19/2008 Document Revised: 10/25/2013 Document Reviewed: 12/07/2012 [facility] Patient Information 2015 [place], [place]. This information is not intended to replace advice given to you by your health care provider. Make sure you discuss any questions you have with your health care provider.

## 2015-05-13 ENCOUNTER — Encounter: Payer: Self-pay | Admitting: Cardiovascular Disease

## 2015-05-15 ENCOUNTER — Other Ambulatory Visit: Payer: Self-pay | Admitting: Cardiovascular Disease

## 2015-05-16 LAB — METANEPHRINES, PLASMA
Metanephrine, Free: <25 pg/mL (ref <=57)
Normetanephrine, Free: 67 pg/mL (ref <=148)
TOTAL METANEPHRINES-PLASMA: 67 pg/mL (ref <=205)

## 2015-05-17 ENCOUNTER — Telehealth (HOSPITAL_COMMUNITY): Payer: Self-pay

## 2015-05-17 ENCOUNTER — Telehealth: Payer: Self-pay | Admitting: *Deleted

## 2015-05-17 NOTE — Telephone Encounter (Signed)
Spoke to patient. Result given . Verbalized understanding  

## 2015-05-17 NOTE — Telephone Encounter (Signed)
-----   Message from Chilton Siiffany [place], MD sent at 05/17/2015  1:53 PM EST ----- Normal labs.  No pheochromocytoma.

## 2015-05-17 NOTE — Telephone Encounter (Signed)
Encounter complete. 

## 2015-05-23 ENCOUNTER — Ambulatory Visit (HOSPITAL_COMMUNITY)
Admission: RE | Admit: 2015-05-23 | Discharge: 2015-05-23 | Disposition: A | Discharge status: Home / Self Care | Payer: 59 | Source: Ambulatory Visit | Attending: Cardiovascular Disease | Admitting: Cardiovascular Disease

## 2015-05-23 DIAGNOSIS — R5383 Other fatigue: Secondary | ICD-10-CM | POA: Diagnosis not present

## 2015-05-23 DIAGNOSIS — R0609 Other forms of dyspnea: Secondary | ICD-10-CM

## 2015-05-23 DIAGNOSIS — R079 Chest pain, unspecified: Secondary | ICD-10-CM | POA: Insufficient documentation

## 2015-05-23 DIAGNOSIS — I10 Essential (primary) hypertension: Secondary | ICD-10-CM | POA: Diagnosis not present

## 2015-05-23 DIAGNOSIS — E669 Obesity, unspecified: Secondary | ICD-10-CM | POA: Diagnosis not present

## 2015-05-23 DIAGNOSIS — R000 Tachycardia, unspecified: Secondary | ICD-10-CM | POA: Diagnosis not present

## 2015-05-23 DIAGNOSIS — Z6841 Body Mass Index (BMI) 40.0 and over, adult: Secondary | ICD-10-CM | POA: Insufficient documentation

## 2015-05-23 DIAGNOSIS — R42 Dizziness and giddiness: Secondary | ICD-10-CM | POA: Diagnosis not present

## 2015-05-23 DIAGNOSIS — R110 Nausea: Secondary | ICD-10-CM | POA: Diagnosis not present

## 2015-05-23 MED ORDER — TECHNETIUM TC 99M SESTAMIBI GENERIC - CARDIOLITE
31.6000 | Freq: Once | INTRAVENOUS | Status: AC | PRN
  Administered 2015-05-23: 31.6 via INTRAVENOUS

## 2015-05-23 MED ORDER — REGADENOSON 0.4 MG/5ML IV SOLN
0.4000 mg | Freq: Once | INTRAVENOUS | Status: AC
  Administered 2015-05-23: 0.4 mg via INTRAVENOUS

## 2015-05-23 MED ORDER — AMINOPHYLLINE 25 MG/ML IV SOLN
75.0000 mg | Freq: Once | INTRAVENOUS | Status: AC
  Administered 2015-05-23: 75 mg via INTRAVENOUS

## 2015-05-24 ENCOUNTER — Ambulatory Visit (HOSPITAL_COMMUNITY)
Admission: RE | Admit: 2015-05-24 | Discharge: 2015-05-24 | Disposition: A | Discharge status: Home / Self Care | Payer: 59 | Source: Ambulatory Visit | Attending: Internal Medicine | Admitting: Internal Medicine

## 2015-05-24 ENCOUNTER — Telehealth: Payer: Self-pay | Admitting: *Deleted

## 2015-05-24 LAB — 5 HIAA W/CREATININE, 24 HR
5-HIAA W/CREATININE 24 HR UR: 2.5 mg/(24.h) (ref <=6.0)
CREATININE 24 HR UR-5 HIAA W/CREAT: 1.37 g/(24.h) (ref 0.63–2.50)
Total Volume: 1400 mL

## 2015-05-24 LAB — MYOCARDIAL PERFUSION IMAGING
CHL CUP NUCLEAR SDS: 8
CHL CUP NUCLEAR SRS: 0
CHL CUP RESTING HR STRESS: 85 {beats}/min
LV dias vol: 77 mL
LV sys vol: 21 mL
NUC STRESS TID: 0.85
Peak HR: 120 {beats}/min
SSS: 8

## 2015-05-24 MED ORDER — TECHNETIUM TC 99M SESTAMIBI GENERIC - CARDIOLITE
32.0000 | Freq: Once | INTRAVENOUS | Status: AC | PRN
  Administered 2015-05-24: 32 via INTRAVENOUS

## 2015-05-24 NOTE — Telephone Encounter (Signed)
Spoke to patient. Result given . Verbalized understanding  

## 2015-05-24 NOTE — Telephone Encounter (Signed)
-----   Message from Chilton Siiffany Alvin, MD sent at 05/24/2015  8:08 AM EST ----- No abnormal heart rhythms were noted.

## 2015-05-25 ENCOUNTER — Ambulatory Visit: Payer: 59 | Admitting: Cardiology

## 2015-05-26 ENCOUNTER — Telehealth: Payer: Self-pay | Admitting: *Deleted

## 2015-05-26 DIAGNOSIS — R0602 Shortness of breath: Secondary | ICD-10-CM

## 2015-05-26 NOTE — Telephone Encounter (Signed)
Spoke to patient.  Labs and stress test Result given . Verbalized understanding Aware she will be schedule for echo- would like before 06/22/15.

## 2015-05-26 NOTE — Telephone Encounter (Signed)
-----   Message from Chilton Siiffany [place], MD sent at 05/25/2015 10:26 PM EST ----- Normal labs.  No carcinoid syndrome.  Please order an echo to evaluate her shortness of breath.

## 2015-05-26 NOTE — Telephone Encounter (Signed)
-----   Message from Chilton Siiffany [place], MD sent at 05/25/2015 10:24 PM EST ----- Normal stress test

## 2015-06-02 ENCOUNTER — Telehealth: Payer: Self-pay | Admitting: Cardiovascular Disease

## 2015-06-02 NOTE — Telephone Encounter (Signed)
Close encounter 

## 2015-06-07 ENCOUNTER — Ambulatory Visit (HOSPITAL_COMMUNITY): Payer: 59 | Attending: Cardiology

## 2015-06-07 ENCOUNTER — Other Ambulatory Visit: Payer: Self-pay

## 2015-06-07 DIAGNOSIS — I340 Nonrheumatic mitral (valve) insufficiency: Secondary | ICD-10-CM | POA: Insufficient documentation

## 2015-06-07 DIAGNOSIS — Z6841 Body Mass Index (BMI) 40.0 and over, adult: Secondary | ICD-10-CM | POA: Diagnosis not present

## 2015-06-07 DIAGNOSIS — R0602 Shortness of breath: Secondary | ICD-10-CM | POA: Diagnosis not present

## 2015-06-07 DIAGNOSIS — I071 Rheumatic tricuspid insufficiency: Secondary | ICD-10-CM | POA: Insufficient documentation

## 2015-06-07 DIAGNOSIS — I10 Essential (primary) hypertension: Secondary | ICD-10-CM | POA: Diagnosis not present

## 2015-06-07 DIAGNOSIS — R0600 Dyspnea, unspecified: Secondary | ICD-10-CM | POA: Diagnosis not present

## 2015-06-07 DIAGNOSIS — I517 Cardiomegaly: Secondary | ICD-10-CM | POA: Diagnosis not present

## 2015-06-14 ENCOUNTER — Telehealth: Payer: Self-pay | Admitting: *Deleted

## 2015-06-14 NOTE — Telephone Encounter (Signed)
-----   Message from Stacey Siiffany [place], MD sent at 06/13/2015 12:15 AM EST ----- The echo shows that her heart does not relax completely.  It will be important keep her blood pressure well-controlled.

## 2015-06-14 NOTE — Telephone Encounter (Signed)
Spoke to patient. ECHO  Result given . Verbalized understanding PATIENT HAS AN APPOINTMENT  06/22/15 W/DR RANDOPLH

## 2015-06-22 ENCOUNTER — Ambulatory Visit (INDEPENDENT_AMBULATORY_CARE_PROVIDER_SITE_OTHER): Payer: 59 | Admitting: Cardiovascular Disease

## 2015-06-22 ENCOUNTER — Encounter: Payer: Self-pay | Admitting: Cardiovascular Disease

## 2015-06-22 VITALS — BP 124/88 | HR 87 | Ht 66.0 in | Wt 247.0 lb

## 2015-06-22 DIAGNOSIS — I872 Venous insufficiency (chronic) (peripheral): Secondary | ICD-10-CM | POA: Diagnosis not present

## 2015-06-22 DIAGNOSIS — F419 Anxiety disorder, unspecified: Secondary | ICD-10-CM | POA: Diagnosis not present

## 2015-06-22 DIAGNOSIS — R609 Edema, unspecified: Secondary | ICD-10-CM | POA: Diagnosis not present

## 2015-06-22 DIAGNOSIS — I10 Essential (primary) hypertension: Secondary | ICD-10-CM

## 2015-06-22 NOTE — Patient Instructions (Signed)
Your physician recommends that you schedule a follow-up appointment in: As Needed    How to Use Compression Stockings Compression stockings are elastic socks that squeeze the legs. They help to increase blood flow to the legs, decrease swelling in the legs, and reduce the chance of developing blood clots in the lower legs. Compression stockings are often used by people who:  Are recovering from surgery.  Have poor circulation in their legs.  Are prone to getting blood clots in their legs.  Have varicose veins.  Sit or stay in bed for long periods of time. HOW TO USE COMPRESSION STOCKINGS Before you put on your compression stockings:  Make sure that they are the correct size. If you do not know your size, ask your health care provider.  Make sure that they are clean, dry, and in good condition.  Check them for rips and tears. Do not put them on if they are ripped or torn. Put your stockings on first thing in the morning, before you get out of bed. Keep them on for as long as your health care provider advises. When you are wearing your stockings:  Keep them as smooth as possible. Do not allow them to bunch up. It is especially important to prevent the stockings from bunching up around your toes or behind your knees.  Do not roll the stockings downward and leave them rolled down. This can decrease blood flow to your leg.  Change them right away if they become wet or dirty. When you take off your stockings, inspect your legs and feet. Anything that does not seem normal may require medical attention. Look for:  Open sores.  Red spots.  Swelling. INFORMATION AND TIPS  Do not stop wearing your compression stockings without talking to your health care provider first.  Wash your stockings everyday with mild detergent in cold or warm water. Do not use bleach. Air-dry your stockings or dry them in a clothes dryer on low heat.  Replace your stockings every 3-6 months.  If skin  moisturizing is part of your treatment plan, apply lotion or cream at night so that your skin will be dry when you put on the stockings in the morning. It is harder to put the stockings on when you have lotion on your legs or feet. SEEK MEDICAL CARE IF: Remove your stockings and seek medical care if:  You have a feeling of pins and needles in your feet or legs.  You have any new changes in your skin.  You have skin lesions that are getting worse.  You have swelling or pain that is getting worse. SEEK IMMEDIATE MEDICAL CARE IF:  You have numbness or tingling in your lower legs that does not get better immediately after you take the stockings off.  Your toes or feet become cold and blue.  You develop open sores or red spots on your legs that do not go away.  You see or feel a warm spot on your leg.  You have new swelling or soreness in your leg.  You are short of breath or you have chest pain for no reason.  You have a rapid or irregular heartbeat.  You feel light-headed or dizzy.   This information is not intended to replace advice given to you by your health care provider. Make sure you discuss any questions you have with your health care provider.   Document Released: 04/07/2009 Document Revised: 10/25/2014 Document Reviewed: 05/18/2014 [employer] Interactive Patient Education [employer]2016 [employer]

## 2015-06-22 NOTE — Progress Notes (Signed)
Cardiology Office Note   Date:  06/22/2015   ID:  [name], DOB Feb 12, 1961, MRN [id number]  PCP:  Stacey Barre, MD  Cardiologist:   Stacey Hook, MD   Chief Complaint  Patient presents with  . Follow-up  . Headache  . Edema    FEET    Patient ID: [name] is a 54 y.o. female with hypertension, gastroesophageal reflux disease and depression who presents for an evaluation of shortness of breath.    Interval History 06/22/15: After her last appointment, [name] was advised to wear compression stockings for what was felt to be neurocardiogenic syncope.  She was also asked to increase her fluid intake.  Lab testing for pheochromocytoma and carcinoid were negative.  She underwent Lexiscan Cardiolite that was negative for ischemia and echocardiogram revealed grade 1 diastolic dysfunction.  [name] has been doing well.  She has been feeling stressed lately.  Her son had a seizure recently.  She is also trying to sell her home, though her husband does not want to sell it.  She is estranged from several family members.  She is accompanied by her daughter, who notes that her mom is very stressed and anxious.  She worries about things that most people would not.  She does note occasional lower extremity edema.  She take an OTC medication for circulation that helps with her edema.  She previously saw a vascular specialist who diagnosed her with varicose veins.  She has not tried compression stockings.   History of Present Illness 05/12/15: She was evaluated by Dr. Everlene Other on 10/22 for a complaint of dizziness and dyspnea on exertion.  EKG showed sinus rhythm with low voltage and poor R wave progression.  BNP was 17.  She was referred to cardiology for further evaluation.  She reports difficulty breathing, dizziness and feeling as though her heart is beating out of her chest.  They're also associated with diarrhea and a feeling of flushing. The episodes started  occurring this summer.  She noticed that she felt hot and clammy.  She felt much better after getting in the pool.  It has been occurring more frequently lately.  She notices it sometimes with minimal exertion, even after feeding her dog.  It always starts with shortness of breath followed by a feeling of being hot and nauseous.  The episodes last up to an entire day.  She does water aerobics and was doing it every day this summer.  She never has episodes with this activity.    She recently went to Urgent care reporting these symptoms and was noted to have hypertension, which was a new diagnosis.  Her blood pressure was 158/90 at that appointment.  She was started on HCTZ but continues to have lower extremity edema.  She has been seen by [facility] and was told that she has varicose veins.  She wakes ups gasping for air.  She feels like someone is smothering her.  She doesn't feel well-rested in the mornings.   Past Medical History  Diagnosis Date  . Hyperplastic colon polyp     04/2010  . GERD (gastroesophageal reflux disease)   . Anemia   . Anxiety   . Depression   . Obesity   . Migraine   . DJD (degenerative joint disease)     knee  . IBS (irritable bowel syndrome)   . Menopause     early  . Impaired glucose tolerance 05/18/2011  . ANEMIA-IRON  DEFICIENCY 01/03/2007  . ANXIETY 01/03/2007  . DEPRESSION 01/03/2007  . GERD 01/16/2009  . Headache(784.0) 01/03/2007  . HYPERSOMNIA 04/04/2010  . IBS 01/16/2009  . 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 Wheezing 07/02/2010    Past Surgical History  Procedure Laterality Date  . Cholecystectomy    . Appendectomy    . Bladder tac    . Abdominal hysterectomy    . Knee arthroscopy    . Oophorectomy       Current Outpatient Prescriptions  Medication Sig Dispense Refill  . esomeprazole (NEXIUM) 20 MG capsule Take 20 mg by mouth daily at 12 noon.    Marland Kitchen estradiol (ESTRACE) 2 MG tablet Take 2 mg by mouth every evening.    .  gabapentin (NEURONTIN) 300 MG capsule Take 1 capsule by mouth daily as needed.    Marland Kitchen ibuprofen (ADVIL,MOTRIN) 200 MG tablet Take 400 mg by mouth every 6 (six) hours as needed for pain.    Marland Kitchen nystatin cream (MYCOSTATIN) Apply 1 application topically daily as needed.  1  . promethazine (PHENERGAN) 25 MG tablet Take 1 tablet (25 mg total) by mouth every 6 (six) hours as needed for nausea. 15 tablet 0  . ranitidine (ZANTAC) 300 MG capsule Take 300 mg by mouth daily as needed for heartburn.     . triamcinolone cream (KENALOG) 0.1 % Apply 1 application topically daily as needed.  1   No current facility-administered medications for this visit.    Allergies:   Meperidine; Morphine; Sucralfate; Azithromycin; Codeine; Oxycodone-acetaminophen; Penicillins; Amoxicillin; Hydrocodone; Omeprazole; Oxycodone-acetaminophen; and Tramadol    Social History:  The patient  reports that she has never smoked. She has never used smokeless tobacco. She reports that she does not drink alcohol or use illicit drugs.   Family History:  The patient's family history includes Diabetes in her brother; Hypertension in her mother.    ROS:  Please see the history of present illness.   Otherwise, review of systems are positive for none.   All other systems are reviewed and negative.    PHYSICAL EXAM: VS:  BP 124/88 mmHg  Pulse 87  Ht  (1.676 m)  Wt 112.038 kg (247 lb)  BMI 39.89 kg/m2 , BMI Body mass index is 39.89 kg/(m^2). GENERAL:  Well appearing.  Slightly anxious. HEENT:  Pupils equal round and reactive, fundi not visualized, oral mucosa unremarkable NECK:  No jugular venous distention, waveform within normal limits, carotid upstroke brisk and symmetric, no bruits, no thyromegaly LYMPHATICS:  No cervical adenopathy LUNGS:  Clear to auscultation bilaterally HEART:  RRR.  PMI not displaced or sustained,S1 and S2 within normal limits, no S3, no S4, no clicks, no rubs, no murmurs ABD:  Flat, positive bowel sounds  normal in frequency in pitch, no bruits, no rebound, no guarding, no midline pulsatile mass, no hepatomegaly, no splenomegaly EXT:  2 plus pulses throughout, no edema, no cyanosis no clubbing SKIN:  No rashes no nodules NEURO:  Cranial nerves II through XII grossly intact, motor grossly intact throughout PSYCH:  Cognitively intact, oriented to person place and time    EKG:  EKG is not ordered today.  Echo 06/07/15: Study Conclusions  - Left ventricle: The cavity size was normal. There was mild focal basal hypertrophy of the septum. Systolic function was vigorous. The estimated ejection fraction was in the range of 65% to 70%. Wall motion was normal; there were no regional wall motion abnormalities. Doppler parameters are consistent with abnormal left ventricular relaxation (grade 1  diastolic dysfunction). There was no evidence of elevated ventricular filling pressure by Doppler parameters. - Aortic valve: Trileaflet; normal thickness leaflets. There was no regurgitation. - Aortic root: The aortic root was normal in size. - Ascending aorta: The ascending aorta was normal in size. - Mitral valve: There was trivial regurgitation. - Left atrium: The atrium was normal in size. - Right ventricle: Systolic function was normal. - Tricuspid valve: There was trivial regurgitation. - Pulmonic valve: There was no regurgitation. - Pulmonary arteries: Systolic pressure was within the normal range. - Inferior vena cava: The vessel was normal in size. - Pericardium, extracardiac: There was no pericardial effusion.  Lexiscan Cardiolite 05/24/15:  Nuclear stress EF: 73%.  The left ventricular ejection fraction is hyperdynamic (>65%).  There was no ST segment deviation noted during stress.  The study is normal. Recent Labs: 01/19/2015: ALT 13; Hemoglobin 13.4; Platelets 209.0 04/10/2015: BUN 11; Creat 0.73; Potassium 3.9; Sodium 140; TSH 1.080 04/17/2015: Pro B  Natriuretic peptide (BNP) 17.05    Lipid Panel    Component Value Date/Time   CHOL 153 01/19/2015 0812   TRIG 312.0* 01/19/2015 0812   HDL 42.20 01/19/2015 0812   CHOLHDL 4 01/19/2015 0812   VLDL 62.4* 01/19/2015 0812   LDLCALC 96 04/02/2010 0850   LDLDIRECT 61.0 01/19/2015 0812      Wt Readings from Last 3 Encounters:  06/22/15 112.038 kg (247 lb)  05/23/15 112.946 kg (249 lb)  05/12/15 113.263 kg (249 lb 11.2 oz)      ASSESSMENT AND PLAN:  # Anxiety: [name] reports feeling anxious and stressed.  This may be contributing to her intermittent hypertension.  I recommended follow up with her PCP or psychiatry.  # Venous insufficiency: She has no edema on exam today.  Symptoms are not consistent with heart failure.  She received a prescription for compression stockings and will follow up with the vein clinic.  # Dizziness: Resolved.  # Exertional dyspnea: Echo and stress test were negative.  Likely related to deconditioning and obesity.  # Hypertension:  Blood pressure well-controlled off all antihypertensives.  Current medicines are reviewed at length with the patient today.  The patient does not have concerns regarding medicines.  The following changes have been made:  no change  Labs/ tests ordered today include:   Orders Placed This Encounter  Procedures  . Compression stockings     Disposition:   FU with Steel Kerney C. Duke Salviaandolph, MD as needed.   Signed, Stacey Hookandolph, Stan Cantave P, MD  06/22/2015 4:52 PM    [facility]

## 2015-09-19 ENCOUNTER — Ambulatory Visit: Payer: 59 | Admitting: Cardiovascular Disease

## 2015-12-04 IMAGING — NM NM MISC PROCEDURE
6 series · 36 of 36 positions shown · non-contrast
Comparison: none

[Series 1: wbr stress-gsp · 6.40mm/px · 6 of 487 frames shown]
[frame 41/487  full-range]
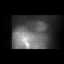
[frame 122/487  full-range]
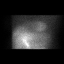
[frame 203/487  full-range]
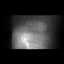
[frame 284/487  full-range]
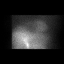
[frame 365/487  full-range]
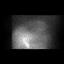
[frame 447/487  full-range]
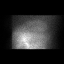

[Series 1: wbr_s-proj_st wbr stress-gsp · 6.40mm/px · 6 of 512 frames shown]
[frame 43/512]
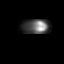
[frame 128/512]
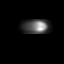
[frame 214/512]
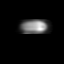
[frame 299/512]
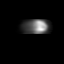
[frame 384/512]
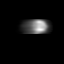
[frame 470/512]
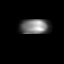

[Series 2: wbr_s-proj_st wbr stress-sum-em · 6.40mm/px · 6 of 64 frames shown]
[frame 6/64]
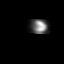
[frame 16/64]
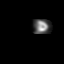
[frame 27/64]
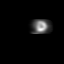
[frame 38/64]
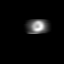
[frame 48/64]
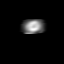
[frame 59/64]
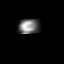

[Series 2: wbr stress-sum-em · 6.40mm/px · 6 of 64 frames shown]
[frame 6/64]
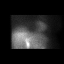
[frame 16/64]
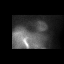
[frame 27/64]
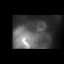
[frame 38/64]
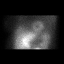
[frame 48/64]
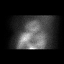
[frame 59/64]
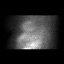

[Series 3: wbr_r-proj_st wbr rest · 6.40mm/px · 6 of 64 frames shown]
[frame 6/64]
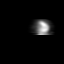
[frame 16/64]
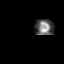
[frame 27/64]
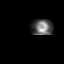
[frame 38/64]
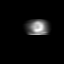
[frame 48/64]
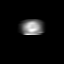
[frame 59/64]
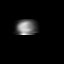

[Series 3: wbr rest · 6.40mm/px · 6 of 64 frames shown]
[frame 6/64]
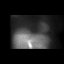
[frame 16/64]
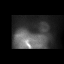
[frame 27/64]
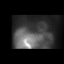
[frame 38/64]
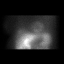
[frame 48/64]
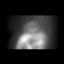
[frame 59/64]
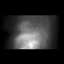

[36 of 36 positions shown; findings below may reference images not displayed]

Canned report from images found in remote index.

Refer to host system for actual result text.

## 2016-01-12 ENCOUNTER — Encounter: Payer: Self-pay | Admitting: Cardiovascular Disease

## 2016-01-12 ENCOUNTER — Ambulatory Visit (INDEPENDENT_AMBULATORY_CARE_PROVIDER_SITE_OTHER): Payer: 59 | Admitting: Cardiovascular Disease

## 2016-01-12 VITALS — BP 149/86 | HR 56 | Ht 65.0 in | Wt 244.4 lb

## 2016-01-12 DIAGNOSIS — R42 Dizziness and giddiness: Secondary | ICD-10-CM | POA: Diagnosis not present

## 2016-01-12 DIAGNOSIS — I10 Essential (primary) hypertension: Secondary | ICD-10-CM

## 2016-01-12 DIAGNOSIS — F419 Anxiety disorder, unspecified: Secondary | ICD-10-CM

## 2016-01-12 NOTE — Progress Notes (Signed)
Cardiology Office Note   Date:  01/12/2016   ID:  Stacey BombardSylvia B [name], DOB 03/15/1961, MRN [id number]  PCP:  Oliver BarreJames John, MD  Cardiologist:   Chilton Siiffany [place], MD   Chief Complaint  Patient presents with  . 6 mo rov    patient complains of nausea, elevated blood pressure. denies having chest pain or dizziness.    History of Present Illness: [name] is a 55 y.o. female with hypertension, gastroesophageal reflux disease and depression who presents for follow up.  [name] was referred 04/2015 or a complaint of dizziness and dyspnea on exertion.  EKG showed sinus rhythm with low voltage and poor R wave progression.  BNP was 17.  The episodes occurred with exertion and were associated with a flushed feeling.  Her symptoms were felt to be neurocardiogenic and she was asked to wear compression stockings (also to help with her varicose veins) and increase her fluid intake.  Lab testing for pheochromocytoma and carcinoid were negative.  She underwent Lexiscan Cardiolite 04/2015 that was negative for ischemia and echocardiogram 05/2015 revealed grade 1 diastolic dysfunction.  Her blood pressure was intermittently elevated. She reported having a lot of anxiety and stress at the time. She was instructed to follow-up with her PCP or psychiatrist to discuss her anxiety.  Since her last appointment [name] reports a few "attacks."  She starts to feel clammy and dizzy and it lasts for about an hour.  There are no clear precipitants.  She denies any chest pain or shortness of breath.  She has been feeling stressed, as she is helping to care for her elderly mother with dementia and poor eyesight and her son has seizures.  She has several siblings but they do not have good relationship and they are not helpful in caring for her mother.  She notes feeling very anxious about all the things that could go wrong with her son and mother.  This prevents her from sleeping at night.  She also has frequent  nightmares.  [name] has not been exercising regularly.  She has chronic knee pain, but does note that it has been better since getting injections in June.  She has been swimming some.  She has been eating a healthier diet including more fruits and vegetables.   Past Medical History  Diagnosis Date  . Hyperplastic colon polyp     04/2010  . GERD (gastroesophageal reflux disease)   . Anemia   . Anxiety   . Depression   . Obesity   . Migraine   . DJD (degenerative joint disease)     knee  . IBS (irritable bowel syndrome)   . Menopause     early  . Impaired glucose tolerance 05/18/2011  . ANEMIA-IRON DEFICIENCY 01/03/2007  . ANXIETY 01/03/2007  . DEPRESSION 01/03/2007  . GERD 01/16/2009  . Headache(784.0) 01/03/2007  . HYPERSOMNIA 04/04/2010  . IBS 01/16/2009  . 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 Wheezing 07/02/2010    Past Surgical History  Procedure Laterality Date  . Cholecystectomy    . Appendectomy    . Bladder tac    . Abdominal hysterectomy    . Knee arthroscopy    . Oophorectomy       Current Outpatient Prescriptions  Medication Sig Dispense Refill  . esomeprazole (NEXIUM) 20 MG capsule Take 20 mg by mouth daily at 12 noon.    Marland Kitchen. estradiol (ESTRACE) 2 MG tablet Take 2 mg by mouth every evening.    .Marland Kitchen  gabapentin (NEURONTIN) 300 MG capsule Take 1 capsule by mouth daily as needed.    Marland Kitchen ibuprofen (ADVIL,MOTRIN) 200 MG tablet Take 400 mg by mouth every 6 (six) hours as needed for pain.    Marland Kitchen nystatin cream (MYCOSTATIN) Apply 1 application topically daily as needed.  1  . promethazine (PHENERGAN) 25 MG tablet Take 1 tablet (25 mg total) by mouth every 6 (six) hours as needed for nausea. 15 tablet 0  . ranitidine (ZANTAC) 300 MG capsule Take 300 mg by mouth daily as needed for heartburn.     . triamcinolone cream (KENALOG) 0.1 % Apply 1 application topically daily as needed.  1   No current facility-administered medications for this visit.    Allergies:    Meperidine; Morphine; Sucralfate; Azithromycin; Codeine; Oxycodone-acetaminophen; Penicillins; Amoxicillin; Hydrocodone; Omeprazole; Oxycodone-acetaminophen; and Tramadol    Social History:  The patient  reports that she has never smoked. She has never used smokeless tobacco. She reports that she does not drink alcohol or use illicit drugs.   Family History:  The patient's family history includes Diabetes in her brother; Hypertension in her mother.    ROS:  Please see the history of present illness.   Otherwise, review of systems are positive for none.   All other systems are reviewed and negative.    PHYSICAL EXAM: VS:  BP 149/86 mmHg  Pulse 56  Ht 5\' 5"  (1.651 m)  Wt 244 lb 6.4 oz (110.859 kg)  BMI 40.67 kg/m2 , BMI Body mass index is 40.67 kg/(m^2). GENERAL:  Well appearing.  Slightly anxious. HEENT:  Pupils equal round and reactive, fundi not visualized, oral mucosa unremarkable NECK:  No jugular venous distention, waveform within normal limits, carotid upstroke brisk and symmetric, no bruits, no thyromegaly LYMPHATICS:  No cervical adenopathy LUNGS:  Clear to auscultation bilaterally HEART:  RRR.  PMI not displaced or sustained,S1 and S2 within normal limits, no S3, no S4, no clicks, no rubs, no murmurs ABD:  Flat, positive bowel sounds normal in frequency in pitch, no bruits, no rebound, no guarding, no midline pulsatile mass, no hepatomegaly, no splenomegaly EXT:  2 plus pulses throughout, no edema, no cyanosis no clubbing SKIN:  No rashes no nodules NEURO:  Cranial nerves II through XII grossly intact, motor grossly intact throughout PSYCH:  Cognitively intact, oriented to person place and time   EKG:  EKG is ordered today. 01/12/16: Sinus bradycardia.  Rate 56 bpm.   Echo 06/07/15: Study Conclusions  - Left ventricle: The cavity size was normal. There was mild focal basal hypertrophy of the septum. Systolic function was vigorous. The estimated ejection fraction was  in the range of 65% to 70%. Wall motion was normal; there were no regional wall motion abnormalities. Doppler parameters are consistent with abnormal left ventricular relaxation (grade 1 diastolic dysfunction). There was no evidence of elevated ventricular filling pressure by Doppler parameters. - Aortic valve: Trileaflet; normal thickness leaflets. There was no regurgitation. - Aortic root: The aortic root was normal in size. - Ascending aorta: The ascending aorta was normal in size. - Mitral valve: There was trivial regurgitation. - Left atrium: The atrium was normal in size. - Right ventricle: Systolic function was normal. - Tricuspid valve: There was trivial regurgitation. - Pulmonic valve: There was no regurgitation. - Pulmonary arteries: Systolic pressure was within the normal range. - Inferior vena cava: The vessel was normal in size. - Pericardium, extracardiac: There was no pericardial effusion.  Lexiscan Cardiolite 05/24/15:  Nuclear stress EF: 73%.  The left ventricular ejection fraction is hyperdynamic (>65%).  There was no ST segment deviation noted during stress.  The study is normal.   Recent Labs: 01/19/2015: ALT 13; Hemoglobin 13.4; Platelets 209.0 04/10/2015: BUN 11; Creat 0.73; Potassium 3.9; Sodium 140; TSH 1.080 04/17/2015: Pro B Natriuretic peptide (BNP) 17.05    Lipid Panel    Component Value Date/Time   CHOL 153 01/19/2015 0812   TRIG 312.0* 01/19/2015 0812   HDL 42.20 01/19/2015 0812   CHOLHDL 4 01/19/2015 0812   VLDL 62.4* 01/19/2015 0812   LDLCALC 96 04/02/2010 0850   LDLDIRECT 61.0 01/19/2015 0812      Wt Readings from Last 3 Encounters:  01/12/16 244 lb 6.4 oz (110.859 kg)  06/22/15 247 lb (112.038 kg)  05/23/15 249 lb (112.946 kg)      ASSESSMENT AND PLAN:  # Anxiety: [name] reports feeling anxious and stressed.  This seems to be the main thing bothering her today. I strongly recommended that she follow up with  her PCP or psychiatry.  She does not want to take any medications.    # Dizziness: Resolved. Stress test and echo were unremarkable.   # Exertional dyspnea: Echo and stress test were negative.  Likely related to deconditioning and obesity.  # Hypertension:  Blood pressure was above goal but decreased to 142/82.  Will continue to monitor.   Current medicines are reviewed at length with the patient today.  The patient does not have concerns regarding medicines.  The following changes have been made:  no change  Labs/ tests ordered today include:   No orders of the defined types were placed in this encounter.    Time spent: 30 minutes-Greater than 50% of this time was spent in counseling, explanation of diagnosis, planning of further management, and coordination of care.   Disposition:   FU with Mikolaj Woolstenhulme C. Duke Salvia, MD in 6 months.   Signed, Chilton Si, MD  01/12/2016 9:46 AM    [facility]

## 2016-01-12 NOTE — Patient Instructions (Signed)
Your physician wants you to follow-up in: 6 months with Dr. [place]. You will receive a reminder letter in the mail two months in advance. If you don't receive a letter, please call our office to schedule the follow-up appointment.  If you need a refill on your cardiac medications before your next appointment, please call your pharmacy.  

## 2016-01-15 ENCOUNTER — Encounter: Payer: Self-pay | Admitting: Cardiovascular Disease

## 2016-01-23 ENCOUNTER — Telehealth: Payer: Self-pay | Admitting: Internal Medicine

## 2016-01-23 DIAGNOSIS — Z1159 Encounter for screening for other viral diseases: Secondary | ICD-10-CM

## 2016-01-23 NOTE — Telephone Encounter (Signed)
The hep c has been added

## 2016-01-23 NOTE — Telephone Encounter (Signed)
Pt request Dr. Jonny Ruiz to add on Hep C on lab work, she has a CPE on 02/16/16 and coming to do lab a week prior. Please help

## 2016-02-09 ENCOUNTER — Other Ambulatory Visit (INDEPENDENT_AMBULATORY_CARE_PROVIDER_SITE_OTHER): Payer: 59

## 2016-02-09 DIAGNOSIS — Z0000 Encounter for general adult medical examination without abnormal findings: Secondary | ICD-10-CM

## 2016-02-09 DIAGNOSIS — R7989 Other specified abnormal findings of blood chemistry: Secondary | ICD-10-CM | POA: Diagnosis not present

## 2016-02-09 DIAGNOSIS — Z1159 Encounter for screening for other viral diseases: Secondary | ICD-10-CM

## 2016-02-09 LAB — URINALYSIS, ROUTINE W REFLEX MICROSCOPIC
Bilirubin Urine: NEGATIVE
Hgb urine dipstick: NEGATIVE
Ketones, ur: NEGATIVE
Leukocytes, UA: NEGATIVE
Nitrite: NEGATIVE
PH: 6.5 (ref 5.0–8.0)
RBC / HPF: NONE SEEN (ref 0–?)
Specific Gravity, Urine: 1.015 (ref 1.000–1.030)
Urine Glucose: NEGATIVE
Urobilinogen, UA: 0.2 (ref 0.0–1.0)

## 2016-02-09 LAB — CBC WITH DIFFERENTIAL/PLATELET
BASOS ABS: 0 10*3/uL (ref 0.0–0.1)
BASOS PCT: 0.5 % (ref 0.0–3.0)
EOS ABS: 0.2 10*3/uL (ref 0.0–0.7)
Eosinophils Relative: 2.9 % (ref 0.0–5.0)
HCT: 39.3 % (ref 36.0–46.0)
HEMOGLOBIN: 13.2 g/dL (ref 12.0–15.0)
Lymphocytes Relative: 39.3 % (ref 12.0–46.0)
Lymphs Abs: 2.9 10*3/uL (ref 0.7–4.0)
MCHC: 33.7 g/dL (ref 30.0–36.0)
MCV: 86.1 fl (ref 78.0–100.0)
MONO ABS: 0.5 10*3/uL (ref 0.1–1.0)
Monocytes Relative: 6.5 % (ref 3.0–12.0)
NEUTROS ABS: 3.7 10*3/uL (ref 1.4–7.7)
Neutrophils Relative %: 50.8 % (ref 43.0–77.0)
PLATELETS: 201 10*3/uL (ref 150.0–400.0)
RBC: 4.57 Mil/uL (ref 3.87–5.11)
RDW: 13 % (ref 11.5–15.5)
WBC: 7.3 10*3/uL (ref 4.0–10.5)

## 2016-02-09 LAB — TSH: TSH: 1.91 u[IU]/mL (ref 0.35–4.50)

## 2016-02-09 LAB — LIPID PANEL
CHOL/HDL RATIO: 3
Cholesterol: 159 mg/dL (ref 0–200)
HDL: 49 mg/dL (ref >39.00)
NONHDL: 109.95
TRIGLYCERIDES: 226 mg/dL — AB (ref 0.0–149.0)
VLDL: 45.2 mg/dL — AB (ref 0.0–40.0)

## 2016-02-09 LAB — BASIC METABOLIC PANEL
BUN: 10 mg/dL (ref 6–23)
CALCIUM: 9.3 mg/dL (ref 8.4–10.5)
CHLORIDE: 103 meq/L (ref 96–112)
CO2: 29 meq/L (ref 19–32)
CREATININE: 0.86 mg/dL (ref 0.40–1.20)
GFR: 72.45 mL/min (ref >60.00)
Glucose, Bld: 123 mg/dL — ABNORMAL HIGH (ref 70–99)
Potassium: 4.4 mEq/L (ref 3.5–5.1)
SODIUM: 141 meq/L (ref 135–145)

## 2016-02-09 LAB — HEPATIC FUNCTION PANEL
ALBUMIN: 3.9 g/dL (ref 3.5–5.2)
ALT: 11 U/L (ref 0–35)
AST: 14 U/L (ref 0–37)
Alkaline Phosphatase: 46 U/L (ref 39–117)
Bilirubin, Direct: 0.1 mg/dL (ref 0.0–0.3)
Total Bilirubin: 0.3 mg/dL (ref 0.2–1.2)
Total Protein: 6.8 g/dL (ref 6.0–8.3)

## 2016-02-09 LAB — LDL CHOLESTEROL, DIRECT: LDL DIRECT: 78 mg/dL

## 2016-02-09 LAB — HEPATITIS C ANTIBODY: HCV Ab: NEGATIVE

## 2016-02-16 ENCOUNTER — Encounter: Payer: 59 | Admitting: Internal Medicine

## 2016-02-29 ENCOUNTER — Encounter: Payer: Self-pay | Admitting: Internal Medicine

## 2016-02-29 ENCOUNTER — Ambulatory Visit (INDEPENDENT_AMBULATORY_CARE_PROVIDER_SITE_OTHER): Payer: 59 | Admitting: Internal Medicine

## 2016-02-29 VITALS — BP 140/80 | HR 92 | Temp 98.1°F | Resp 20 | Wt 240.0 lb

## 2016-02-29 DIAGNOSIS — R739 Hyperglycemia, unspecified: Secondary | ICD-10-CM | POA: Insufficient documentation

## 2016-02-29 DIAGNOSIS — Z0001 Encounter for general adult medical examination with abnormal findings: Secondary | ICD-10-CM

## 2016-02-29 DIAGNOSIS — I10 Essential (primary) hypertension: Secondary | ICD-10-CM | POA: Diagnosis not present

## 2016-02-29 DIAGNOSIS — E781 Pure hyperglyceridemia: Secondary | ICD-10-CM | POA: Insufficient documentation

## 2016-02-29 DIAGNOSIS — E785 Hyperlipidemia, unspecified: Secondary | ICD-10-CM | POA: Insufficient documentation

## 2016-02-29 DIAGNOSIS — M62830 Muscle spasm of back: Secondary | ICD-10-CM | POA: Diagnosis not present

## 2016-02-29 DIAGNOSIS — E119 Type 2 diabetes mellitus without complications: Secondary | ICD-10-CM | POA: Insufficient documentation

## 2016-02-29 DIAGNOSIS — R6889 Other general symptoms and signs: Secondary | ICD-10-CM | POA: Diagnosis not present

## 2016-02-29 MED ORDER — CYCLOBENZAPRINE HCL 5 MG PO TABS: 5.0000 mg | ORAL_TABLET | Freq: Three times a day (TID) | ORAL | 1 refills | Status: DC | PRN

## 2016-02-29 NOTE — Patient Instructions (Addendum)
Your Hgba1c (or "a1c") was OK today at 5.8 (normal up to 6.5), so your overall blood sugar was OK  Please take all new medication as prescribed - the muscle relaxer as needed  Please continue all other medications as before, and refills have been done if requested.  Please have the pharmacy call with any other refills you may need.  Please continue your efforts at being more active, low cholesterol diet, and weight control.  You are otherwise up to date with prevention measures today.  Please keep your appointments with your specialists as you may have planned  Please return in 1 year for your yearly visit, or sooner if needed, with Lab testing done 3-5 days before

## 2016-02-29 NOTE — Progress Notes (Signed)
Pre visit review using our clinic review tool, if applicable. No additional management support is needed unless otherwise documented below in the visit note. 

## 2016-02-29 NOTE — Progress Notes (Signed)
Subjective:    Patient ID: [name], female    DOB: December 16, 1960, 55 y.o.   MRN: [id number]  HPI  Here for wellness and f/u;  Overall doing ok;  Pt denies Chest pain, worsening SOB, DOE, wheezing, orthopnea, PND, worsening LE edema, palpitations, dizziness or syncope.  Pt denies neurological change such as new headache, facial or extremity weakness.  Pt denies polydipsia, polyuria, or low sugar symptoms. Pt states overall good compliance with treatment and medications, good tolerability, and has been trying to follow appropriate diet.  Pt denies worsening depressive symptoms, suicidal ideation or panic. No fever, night sweats, wt loss, loss of appetite, or other constitutional symptoms.  Pt states good ability with ADL's, has low fall risk, home safety reviewed and adequate, no other significant changes in hearing or vision, and only occasionally active with exercise.  Has appt in nov 2017 with GYN for pap/mammogram.  Declines flu shot today.  Sees cardiology for better BP control  C/o bilat upper back pain, mild, x 2 wks without neck or LBP, and Pt denies bowel or bladder change, fever, wt loss,  worsening upper or LE pain/numbness/weakness, gait change or falls. Constant, persistent, worse to use the arms and shoulders, , nothing makes better Past Medical History:  Diagnosis Date  . Anemia   . ANEMIA-IRON DEFICIENCY 01/03/2007  . Anxiety   . ANXIETY 01/03/2007  . Depression   . DEPRESSION 01/03/2007  . DJD (degenerative joint disease)    knee  . GERD 01/16/2009  . GERD (gastroesophageal reflux disease)   . Headache(784.0) 01/03/2007  . Hyperplastic colon polyp    04/2010  . HYPERSOMNIA 04/04/2010  . IBS 01/16/2009  . IBS (irritable bowel syndrome)   . Impaired glucose tolerance 05/18/2011  . Menopause    early  . Migraine   . Obesity   . 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 Wheezing 07/02/2010   Past Surgical History:  Procedure Laterality Date  . ABDOMINAL HYSTERECTOMY     . APPENDECTOMY    . bladder tac    . CHOLECYSTECTOMY    . KNEE ARTHROSCOPY    . OOPHORECTOMY      reports that she has never smoked. She has never used smokeless tobacco. She reports that she does not drink alcohol or use drugs. family history includes Diabetes in her brother; Hypertension in her mother. Allergies  Allergen Reactions  . Meperidine Shortness Of Breath  . Morphine Anaphylaxis  . Sucralfate Other (See Comments)    Blurry vision  . Azithromycin Nausea And Vomiting  . Codeine Nausea Only and Other (See Comments)    Hot flashes also  . Oxycodone-Acetaminophen Rash  . Penicillins Hives, Nausea Only and Swelling  . Amoxicillin Nausea Only  . Hydrocodone Itching, Nausea Only and Rash  . Omeprazole Itching and Rash  . Oxycodone-Acetaminophen Nausea Only  . Tramadol Itching, Nausea Only and Rash   Current Outpatient Prescriptions on File Prior to Visit  Medication Sig Dispense Refill  . esomeprazole (NEXIUM) 20 MG capsule Take 20 mg by mouth daily at 12 noon.    Marland Kitchen estradiol (ESTRACE) 2 MG tablet Take 2 mg by mouth every evening.    . gabapentin (NEURONTIN) 300 MG capsule Take 1 capsule by mouth daily as needed.    Marland Kitchen ibuprofen (ADVIL,MOTRIN) 200 MG tablet Take 400 mg by mouth every 6 (six) hours as needed for pain.    Marland Kitchen nystatin cream (MYCOSTATIN) Apply 1 application topically daily as needed.  1  .  promethazine (PHENERGAN) 25 MG tablet Take 1 tablet (25 mg total) by mouth every 6 (six) hours as needed for nausea. 15 tablet 0  . ranitidine (ZANTAC) 300 MG capsule Take 300 mg by mouth daily as needed for heartburn.     . triamcinolone cream (KENALOG) 0.1 % Apply 1 application topically daily as needed.  1   No current facility-administered medications on file prior to visit.    Review of Systems Constitutional: Negative for increased diaphoresis, or other activity, appetite or siginficant weight change other than noted HENT: Negative for worsening hearing loss, ear pain,  facial swelling, mouth sores and neck stiffness.   Eyes: Negative for other worsening pain, redness or visual disturbance.  Respiratory: Negative for choking or stridor Cardiovascular: Negative for other chest pain and palpitations.  Gastrointestinal: Negative for worsening diarrhea, blood in stool, or abdominal distention Genitourinary: Negative for hematuria, flank pain or change in urine volume.  Musculoskeletal: Negative for myalgias or other joint complaints.  Skin: Negative for other color change and wound or drainage.  Neurological: Negative for syncope and numbness. other than noted Hematological: Negative for adenopathy. or other swelling Psychiatric/Behavioral: Negative for hallucinations, SI, self-injury, decreased concentration or other worsening agitation.      Objective:   Physical Exam BP 140/80   Pulse 92   Temp 98.1 F (36.7 C) (Oral)   Resp 20   Wt 240 lb (108.9 kg)   SpO2 98%   BMI 39.94 kg/m  VS noted,  Constitutional: Pt is oriented to person, place, and time. Appears well-developed and well-nourished, in no significant distress Head: Normocephalic and atraumatic  Eyes: Conjunctivae and EOM are normal. Pupils are equal, round, and reactive to light Right Ear: External ear normal.  Left Ear: External ear normal Nose: Nose normal.  Mouth/Throat: Oropharynx is clear and moist  Neck: Normal range of motion. Neck supple. No JVD present. No tracheal deviation present or significant neck LA or mass Cardiovascular: Normal rate, regular rhythm, normal heart sounds and intact distal pulses.   Pulmonary/Chest: Effort normal and breath sounds without rales or wheezing  Abdominal: Soft. Bowel sounds are normal. NT. No HSM  Musculoskeletal: Normal range of motion. Exhibits no edema  Has bilat thoracic paravertebral tender spasm noted,  Spine: nontender in the midline Lymphadenopathy: Has no cervical adenopathy.  Neurological: Pt is alert and oriented to person, place,  and time. Pt has normal reflexes. No cranial nerve deficit. Motor grossly intact Skin: Skin is warm and dry. No rash noted or new ulcers Psychiatric:  Has normal mood and affect. Behavior is normal.   Hgba1c - POCT today 5.8 Lab Results  Component Value Date   WBC 7.3 02/09/2016   HGB 13.2 02/09/2016   HCT 39.3 02/09/2016   PLT 201.0 02/09/2016   GLUCOSE 123 (H) 02/09/2016   CHOL 159 02/09/2016   TRIG 226.0 (H) 02/09/2016   HDL 49.00 02/09/2016   LDLDIRECT 78.0 02/09/2016   LDLCALC 96 04/02/2010   ALT 11 02/09/2016   AST 14 02/09/2016   NA 141 02/09/2016   K 4.4 02/09/2016   CL 103 02/09/2016   CREATININE 0.86 02/09/2016   BUN 10 02/09/2016   CO2 29 02/09/2016   TSH 1.91 02/09/2016   HGBA1C 6.1 04/10/2015   -    Assessment & Plan:

## 2016-03-03 NOTE — Assessment & Plan Note (Signed)
stable overall by history and exam, recent data reviewed with pt, and pt to continue medical treatment as before,  to f/u any worsening symptoms or concerns BP Readings from Last 3 Encounters:  02/29/16 140/80  01/12/16 (!) 149/86  06/22/15 124/88

## 2016-03-03 NOTE — Assessment & Plan Note (Signed)

## 2016-03-03 NOTE — Assessment & Plan Note (Addendum)
In the setting of chronic pain, for muscle relaxer prn,  to f/u any worsening symptoms or concerns  In addition to the time spent performing CPE, I spent an additional 25 minutes face to face,in which greater than 50% of this time was spent in counseling and coordination of care for patient's acute illness as documented.

## 2016-03-03 NOTE — Assessment & Plan Note (Signed)
a1c today is normal, asympt, for cont diet and wt loss efforts,  to f/u any worsening symptoms or concerns

## 2016-03-03 NOTE — Assessment & Plan Note (Signed)
Mild, for lower fat diet, wt loss,  to f/u any worsening symptoms or concerns

## 2016-03-20 ENCOUNTER — Telehealth: Payer: Self-pay | Admitting: Internal Medicine

## 2016-03-20 NOTE — Telephone Encounter (Signed)
Patient got her flu shot at work on 03/18/2016. Please extract

## 2016-03-20 NOTE — Telephone Encounter (Signed)
This has been done.

## 2016-03-21 ENCOUNTER — Ambulatory Visit: Payer: 59

## 2016-06-22 ENCOUNTER — Ambulatory Visit (INDEPENDENT_AMBULATORY_CARE_PROVIDER_SITE_OTHER): Payer: BLUE CROSS/BLUE SHIELD | Admitting: Internal Medicine

## 2016-06-22 VITALS — BP 134/70 | HR 82 | Temp 98.5°F | Resp 20

## 2016-06-22 DIAGNOSIS — R42 Dizziness and giddiness: Secondary | ICD-10-CM | POA: Insufficient documentation

## 2016-06-22 DIAGNOSIS — R110 Nausea: Secondary | ICD-10-CM | POA: Diagnosis not present

## 2016-06-22 DIAGNOSIS — I10 Essential (primary) hypertension: Secondary | ICD-10-CM | POA: Diagnosis not present

## 2016-06-22 DIAGNOSIS — R7302 Impaired glucose tolerance (oral): Secondary | ICD-10-CM

## 2016-06-22 NOTE — Progress Notes (Signed)
Pre visit review using our clinic review tool, if applicable. No additional management support is needed unless otherwise documented below in the visit note. 

## 2016-06-22 NOTE — Progress Notes (Signed)
Subjective:    Patient ID: Stacey BombardSylvia B [name], female    DOB: 12/20/1960, 55 y.o.   MRN: [id number]  HPI  Here with episode yesterday with dizziness and nausea, with dizziness starting first, but Pt denies chest pain, increased sob or doe, wheezing, orthopnea, PND, increased LE swelling, palpitations, or syncope.  Lasted 1 hr, felt better to start with lying on floor.  Did feel poorly last wk with what sounds like possible URI.  Had some popping to ears but no ear pain, reduced hearing or d/c or swelling  Pt denies polydipsia, polyuria,  Pt denies new neurological symptoms such as new headache, or facial or extremity weakness or numbness No other new history   Past Medical History:  Diagnosis Date  . Anemia   . ANEMIA-IRON DEFICIENCY 01/03/2007  . Anxiety   . ANXIETY 01/03/2007  . Depression   . DEPRESSION 01/03/2007  . DJD (degenerative joint disease)    knee  . GERD 01/16/2009  . GERD (gastroesophageal reflux disease)   . Headache(784.0) 01/03/2007  . Hyperplastic colon polyp    04/2010  . HYPERSOMNIA 04/04/2010  . IBS 01/16/2009  . IBS (irritable bowel syndrome)   . Impaired glucose tolerance 05/18/2011  . Menopause    early  . Migraine   . Obesity   . 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 Wheezing 07/02/2010   Past Surgical History:  Procedure Laterality Date  . ABDOMINAL HYSTERECTOMY    . APPENDECTOMY    . bladder tac    . CHOLECYSTECTOMY    . KNEE ARTHROSCOPY    . OOPHORECTOMY      reports that she has never smoked. She has never used smokeless tobacco. She reports that she does not drink alcohol or use drugs. family history includes Diabetes in her brother; Hypertension in her mother. Allergies  Allergen Reactions  . Meperidine Shortness Of Breath  . Morphine Anaphylaxis  . Sucralfate Other (See Comments)    Blurry vision  . Azithromycin Nausea And Vomiting  . Codeine Nausea Only and Other (See Comments)    Hot flashes also  . Oxycodone-Acetaminophen Rash  .  Penicillins Hives, Nausea Only and Swelling  . Amoxicillin Nausea Only  . Hydrocodone Itching, Nausea Only and Rash  . Omeprazole Itching and Rash  . Oxycodone-Acetaminophen Nausea Only  . Tramadol Itching, Nausea Only and Rash   Current Outpatient Prescriptions on File Prior to Visit  Medication Sig Dispense Refill  . cyclobenzaprine (FLEXERIL) 5 MG tablet Take 1 tablet (5 mg total) by mouth 3 (three) times daily as needed for muscle spasms. 30 tablet 1  . esomeprazole (NEXIUM) 20 MG capsule Take 20 mg by mouth daily at 12 noon.    Marland Kitchen. estradiol (ESTRACE) 2 MG tablet Take 2 mg by mouth every evening.    . gabapentin (NEURONTIN) 300 MG capsule Take 1 capsule by mouth daily as needed.    Marland Kitchen. ibuprofen (ADVIL,MOTRIN) 200 MG tablet Take 400 mg by mouth every 6 (six) hours as needed for pain.    Marland Kitchen. nystatin cream (MYCOSTATIN) Apply 1 application topically daily as needed.  1  . promethazine (PHENERGAN) 25 MG tablet Take 1 tablet (25 mg total) by mouth every 6 (six) hours as needed for nausea. 15 tablet 0  . ranitidine (ZANTAC) 300 MG capsule Take 300 mg by mouth daily as needed for heartburn.     . triamcinolone cream (KENALOG) 0.1 % Apply 1 application topically daily as needed.  1   No current facility-administered  medications on file prior to visit.    Review of Systems   Constitutional: Negative for unusual diaphoresis or night sweats HENT: Negative for ear swelling or discharge Eyes: Negative for worsening visual haziness  Respiratory: Negative for choking and stridor.   Gastrointestinal: Negative for distension or worsening eructation Genitourinary: Negative for retention or change in urine volume.  Musculoskeletal: Negative for other MSK pain or swelling Skin: Negative for color change and worsening wound Neurological: Negative for tremors and numbness other than noted  Psychiatric/Behavioral: Negative for decreased concentration or agitation other than above   All otherwise neg per  pt     Objective:   Physical Exam BP 134/70   Pulse 82   Temp 98.5 F (36.9 C) (Oral)   Resp 20   SpO2 98%  VS noted,  Constitutional: Pt appears in no apparent distress HENT: Head: NCAT.  Right Ear: External ear normal.  Left Ear: External ear normal.  Eyes: . Pupils are equal, round, and reactive to light. Conjunctivae and EOM are normal Bilat tm's with mild erythema.  Max sinus areas non tender.  Pharynx with mild erythema, no exudate Neck: Normal range of motion. Neck supple.  Cardiovascular: Normal rate and regular rhythm.   Pulmonary/Chest: Effort normal and breath sounds without rales or wheezing.  Abd:  Soft, NT, ND, + BS - benign exam Neurological: Pt is alert. Not confused , motor 5/5 intact, cn 2-12 intact Skin: Skin is warm. No rash, no LE edema Psychiatric: Pt behavior is normal. No agitation.  No other new exam findings    Assessment & Plan:

## 2016-06-22 NOTE — Patient Instructions (Signed)
Please continue all other medications as before, including the nausea medication  Please have the pharmacy call with any other refills you may need.  Please keep your appointments with your specialists as you may have planned  You are given the work note

## 2016-06-23 NOTE — Assessment & Plan Note (Signed)
Lab Results  Component Value Date   HGBA1C 6.1 04/10/2015   stable overall by history and exam, recent data reviewed with pt, and pt to continue medical treatment as before,  to f/u any worsening symptoms or concerns

## 2016-06-23 NOTE — Assessment & Plan Note (Signed)
Etiology unclear, suspect middle ear related/peripheral vertigo, pt declines meclizine, for mucinex otc prn,  to f/u any worsening symptoms or concerns

## 2016-06-23 NOTE — Assessment & Plan Note (Signed)
Mild to mod, for antiemetic prn  to f/u any worsening symptoms or concerns

## 2016-06-23 NOTE — Assessment & Plan Note (Signed)
stable overall by history and exam, recent data reviewed with pt, and pt to continue medical treatment as before,  to f/u any worsening symptoms or concerns BP Readings from Last 3 Encounters:  06/22/16 134/70  02/29/16 140/80  01/12/16 (!) 149/86

## 2016-08-02 ENCOUNTER — Encounter: Payer: Self-pay | Admitting: *Deleted

## 2016-08-02 ENCOUNTER — Ambulatory Visit (INDEPENDENT_AMBULATORY_CARE_PROVIDER_SITE_OTHER): Payer: BLUE CROSS/BLUE SHIELD | Admitting: Cardiovascular Disease

## 2016-08-02 ENCOUNTER — Encounter: Payer: Self-pay | Admitting: Cardiovascular Disease

## 2016-08-02 VITALS — BP 152/74 | HR 78 | Ht 65.0 in | Wt 225.0 lb

## 2016-08-02 DIAGNOSIS — R0789 Other chest pain: Secondary | ICD-10-CM | POA: Diagnosis not present

## 2016-08-02 DIAGNOSIS — R0602 Shortness of breath: Secondary | ICD-10-CM | POA: Diagnosis not present

## 2016-08-02 DIAGNOSIS — Z79899 Other long term (current) drug therapy: Secondary | ICD-10-CM | POA: Diagnosis not present

## 2016-08-02 DIAGNOSIS — I10 Essential (primary) hypertension: Secondary | ICD-10-CM | POA: Diagnosis not present

## 2016-08-02 MED ORDER — LISINOPRIL 10 MG PO TABS: 10.0000 mg | ORAL_TABLET | Freq: Every day | ORAL | 5 refills | Status: DC

## 2016-08-02 NOTE — Patient Instructions (Signed)
Medication Instructions:  START LISINOPRIL 10 MG DAILY  Labwork: BMET IN 1 WEEK AT [facility] ON THE FIRST FLOOR   Testing/Procedures: NONE  Follow-Up: Your physician recommends that you schedule a follow-up appointment in: 1 MONTH   If you need a refill on your cardiac medications before your next appointment, please call your pharmacy.

## 2016-08-02 NOTE — Progress Notes (Signed)
Cardiology Office Note   Date:  08/02/2016   ID:  Stacey BombardSylvia B Armand, DOB 01/01/1961, MRN [id number]  PCP:  Oliver BarreJames John, MD  Cardiologist:   Chilton Siiffany , MD   No chief complaint on file.   History of Present Illness: [name] is a 56 y.o. female with hypertension, gastroesophageal reflux disease and depression who presents for follow up.  [name] was referred 04/2015 or a complaint of dizziness and dyspnea on exertion.  EKG showed sinus rhythm with low voltage and poor R wave progression.  BNP was 17.  The episodes occurred with exertion and were associated with a flushed feeling.  Her symptoms were felt to be neurocardiogenic and she was asked to wear compression stockings (also to help with her varicose veins) and increase her fluid intake.  Lab testing for pheochromocytoma and carcinoid were negative.  She underwent Lexiscan Cardiolite 04/2015 that was negative for ischemia and echocardiogram 05/2015 revealed grade 1 diastolic dysfunction.  Her blood pressure was intermittently elevated. She reported having a lot of anxiety and stress at the time. She was instructed to follow-up with her PCP or psychiatrist to discuss her anxiety.  Since her last appointment Ms. Younkin has been doing OK.  She notes a few episodes of her chest bothering her.  It typically occurs when she is at work.  Her brother died three weeks ago, which has also exacerbated her symptoms.  She denies exertional symptoms.  She also denies lower extremity edema, orthopnea or PND.  She continues to walk at lunch daily.  She also drinks a protein shake for lunch.  She has lost 20 lb.   Past Medical History:  Diagnosis Date  . Anemia   . ANEMIA-IRON DEFICIENCY 01/03/2007  . Anxiety   . ANXIETY 01/03/2007  . Depression   . DEPRESSION 01/03/2007  . DJD (degenerative joint disease)    knee  . GERD 01/16/2009  . GERD (gastroesophageal reflux disease)   . Headache(784.0) 01/03/2007  . Hyperplastic colon polyp    04/2010  . HYPERSOMNIA 04/04/2010  . IBS 01/16/2009  . IBS (irritable bowel syndrome)   . Impaired glucose tolerance 05/18/2011  . Menopause    early  . Migraine   . Obesity   . 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 Wheezing 07/02/2010    Past Surgical History:  Procedure Laterality Date  . ABDOMINAL HYSTERECTOMY    . APPENDECTOMY    . bladder tac    . CHOLECYSTECTOMY    . KNEE ARTHROSCOPY    . OOPHORECTOMY       Current Outpatient Prescriptions  Medication Sig Dispense Refill  . cyclobenzaprine (FLEXERIL) 5 MG tablet Take 1 tablet (5 mg total) by mouth 3 (three) times daily as needed for muscle spasms. 30 tablet 1  . esomeprazole (NEXIUM) 20 MG capsule Take 20 mg by mouth daily at 12 noon.    Marland Kitchen. estradiol (ESTRACE) 2 MG tablet Take 2 mg by mouth every evening.    . gabapentin (NEURONTIN) 300 MG capsule Take 1 capsule by mouth daily as needed.    Marland Kitchen. ibuprofen (ADVIL,MOTRIN) 200 MG tablet Take 400 mg by mouth every 6 (six) hours as needed for pain.    Marland Kitchen. nystatin cream (MYCOSTATIN) Apply 1 application topically daily as needed.  1  . promethazine (PHENERGAN) 25 MG tablet Take 1 tablet (25 mg total) by mouth every 6 (six) hours as needed for nausea. 15 tablet 0  . ranitidine (ZANTAC) 300 MG capsule Take 300  mg by mouth daily as needed for heartburn.     . triamcinolone cream (KENALOG) 0.1 % Apply 1 application topically daily as needed.  1  . lisinopril (PRINIVIL,ZESTRIL) 10 MG tablet Take 1 tablet (10 mg total) by mouth daily. 30 tablet 5   No current facility-administered medications for this visit.     Allergies:   Meperidine; Morphine; Sucralfate; Azithromycin; Codeine; Oxycodone-acetaminophen; Penicillins; Sucralfate; Amoxicillin; Hydrocodone; Omeprazole; Oxycodone-acetaminophen; and Tramadol    Social History:  The patient  reports that she has never smoked. She has never used smokeless tobacco. She reports that she does not drink alcohol or use drugs.   Family  History:  The patient's family history includes Diabetes in her brother; Hypertension in her mother.    ROS:  Please see the history of present illness.   Otherwise, review of systems are positive for none.   All other systems are reviewed and negative.    PHYSICAL EXAM: VS:  BP (!) 152/74   Pulse 78   Ht 5\' 5"  (1.651 m)   Wt 102.1 kg (225 lb)   BMI 37.44 kg/m  , BMI Body mass index is 37.44 kg/m. GENERAL:  Well appearing.  Slightly anxious. HEENT:  Pupils equal round and reactive, fundi not visualized, oral mucosa unremarkable NECK:  No jugular venous distention, waveform within normal limits, carotid upstroke brisk and symmetric, no bruits LYMPHATICS:  No cervical adenopathy LUNGS:  Clear to auscultation bilaterally HEART:  RRR.  PMI not displaced or sustained,S1 and S2 within normal limits, no S3, no S4, no clicks, no rubs, no murmurs ABD:  Flat, positive bowel sounds normal in frequency in pitch, no bruits, no rebound, no guarding, no midline pulsatile mass, no hepatomegaly, no splenomegaly EXT:  2 plus pulses throughout, no edema, no cyanosis no clubbing SKIN:  No rashes no nodules NEURO:  Cranial nerves II through XII grossly intact, motor grossly intact throughout PSYCH:  Cognitively intact, oriented to person place and time   EKG:  EKG is ordered today. 01/12/16: Sinus bradycardia.  Rate 56 bpm.  08/02/16: Sinus rhythm.  Rate 78 bpm.    Echo 06/07/15: Study Conclusions  - Left ventricle: The cavity size was normal. There was mild focal basal hypertrophy of the septum. Systolic function was vigorous. The estimated ejection fraction was in the range of 65% to 70%. Wall motion was normal; there were no regional wall motion abnormalities. Doppler parameters are consistent with abnormal left ventricular relaxation (grade 1 diastolic dysfunction). There was no evidence of elevated ventricular filling pressure by Doppler parameters. - Aortic valve: Trileaflet;  normal thickness leaflets. There was no regurgitation. - Aortic root: The aortic root was normal in size. - Ascending aorta: The ascending aorta was normal in size. - Mitral valve: There was trivial regurgitation. - Left atrium: The atrium was normal in size. - Right ventricle: Systolic function was normal. - Tricuspid valve: There was trivial regurgitation. - Pulmonic valve: There was no regurgitation. - Pulmonary arteries: Systolic pressure was within the normal range. - Inferior vena cava: The vessel was normal in size. - Pericardium, extracardiac: There was no pericardial effusion.  Lexiscan Cardiolite 05/24/15:  Nuclear stress EF: 73%.  The left ventricular ejection fraction is hyperdynamic (>65%).  There was no ST segment deviation noted during stress.  The study is normal.   Recent Labs: 02/09/2016: ALT 11; BUN 10; Creatinine, Ser 0.86; Hemoglobin 13.2; Platelets 201.0; Potassium 4.4; Sodium 141; TSH 1.91    Lipid Panel    Component Value  Date/Time   CHOL 159 02/09/2016 0734   TRIG 226.0 (H) 02/09/2016 0734   HDL 49.00 02/09/2016 0734   CHOLHDL 3 02/09/2016 0734   VLDL 45.2 (H) 02/09/2016 0734   LDLCALC 96 04/02/2010 0850   LDLDIRECT 78.0 02/09/2016 0734      Wt Readings from Last 3 Encounters:  08/02/16 102.1 kg (225 lb)  02/29/16 108.9 kg (240 lb)  01/12/16 110.9 kg (244 lb 6.4 oz)      ASSESSMENT AND PLAN:  # Hypertension:  Blood pressure remains above goal initially and on repeat.  Start lisinopril 10 mg daily.  Check BMP in 1 week.    # Anxiety: I suspect this is the main cause of her chest pain.  Cardiac testing has been unremarkable.  # Dizziness: Resolved. Stress test and echo were unremarkable.   # Exertional dyspnea: Echo and stress test were negative.  Symptoms improving with exercise.     Current medicines are reviewed at length with the patient today.  The patient does not have concerns regarding medicines.  The following changes  have been made:  Start lisinopril 10 mg daily. Labs/ tests ordered today include:   Orders Placed This Encounter  Procedures  . Basic metabolic panel  . EKG 12-Lead      Disposition:   FU with Dajour Pierpoint C. Duke Salvia, MD in 1 month   Signed, Chilton Si, MD  08/02/2016 10:42 PM    [facility]

## 2016-08-06 ENCOUNTER — Telehealth: Payer: Self-pay | Admitting: Cardiovascular Disease

## 2016-08-06 NOTE — Telephone Encounter (Signed)
She can take Advil 200mg  (OTC) only as needed. Not recommended for prolong daily use.

## 2016-08-06 NOTE — Telephone Encounter (Signed)
New Message      Pt wants to know if she can take Advil with her High bp

## 2016-08-07 NOTE — Telephone Encounter (Signed)
No answer, phone goes to VM. Per DPR OK to leave detailed msg. I have left msg w recommendations and guidance on this medication, and advised to contact our office if further questions/needs discussion of symptoms. Advised use of medication at indication of box directions/dosage, but if she finds she needs something for more than a few days she will need to contact us or PCP office for further instructions.

## 2016-08-08 NOTE — Telephone Encounter (Signed)
Left message to call back -to let office know she received information. Other wise encounter will be closed .

## 2016-08-09 ENCOUNTER — Telehealth: Payer: Self-pay | Admitting: *Deleted

## 2016-08-09 NOTE — Telephone Encounter (Signed)
Left detailed message on cell, ok per DPR to get 1 week follow up labs

## 2016-08-15 LAB — BASIC METABOLIC PANEL
BUN / CREAT RATIO: 18 (ref 9–23)
BUN: 14 mg/dL (ref 6–24)
CALCIUM: 10.1 mg/dL (ref 8.7–10.2)
CO2: 27 mmol/L (ref 18–29)
CREATININE: 0.79 mg/dL (ref 0.57–1.00)
Chloride: 101 mmol/L (ref 96–106)
GFR calc Af Amer: 97 (ref >59)
GFR calc non Af Amer: 84 (ref >59)
Glucose: 124 mg/dL — ABNORMAL HIGH (ref 65–99)
Potassium: 3.8 mmol/L (ref 3.5–5.2)
Sodium: 133 mmol/L — ABNORMAL LOW (ref 134–144)

## 2016-08-21 ENCOUNTER — Telehealth: Payer: Self-pay | Admitting: Cardiovascular Disease

## 2016-08-21 NOTE — Telephone Encounter (Signed)
New message     Pt is calling to see if you received lab work back, regarding blood pressure medicine

## 2016-08-21 NOTE — Telephone Encounter (Signed)
Attempt to call back-no answer.  Left detailed message with results and recommendations:  Notes Recorded by Chilton Siiffany Adelino, MD on 08/19/2016 at 6:25 AM EST Labs OK. Continue lisinopril   Advised to call with further questions or concerns.

## 2016-09-02 ENCOUNTER — Ambulatory Visit: Payer: BLUE CROSS/BLUE SHIELD | Admitting: Physician Assistant

## 2016-09-13 ENCOUNTER — Ambulatory Visit (INDEPENDENT_AMBULATORY_CARE_PROVIDER_SITE_OTHER): Payer: 59 | Admitting: Cardiovascular Disease

## 2016-09-13 ENCOUNTER — Encounter: Payer: Self-pay | Admitting: Cardiovascular Disease

## 2016-09-13 VITALS — BP 117/75 | HR 71 | Ht 65.0 in | Wt 222.0 lb

## 2016-09-13 DIAGNOSIS — F419 Anxiety disorder, unspecified: Secondary | ICD-10-CM

## 2016-09-13 DIAGNOSIS — I10 Essential (primary) hypertension: Secondary | ICD-10-CM

## 2016-09-13 NOTE — Progress Notes (Signed)
Cardiology Office Note   Date:  09/13/2016   ID:  [name], DOB 02-15-1961, MRN [id number]  PCP:  Oliver Barre, MD  Cardiologist:   Chilton Si, MD   Chief Complaint  Patient presents with  . Follow-up    pt stated she felt dizzy a week ago    History of Present Illness: [name] is a 56 y.o. female with hypertension, gastroesophageal reflux disease and depression who presents for follow up.  Ms. Chavero was referred 04/2015 or a complaint of dizziness and dyspnea on exertion.  EKG showed sinus rhythm with low voltage and poor R wave progression.  BNP was 17.  The episodes occurred with exertion and were associated with a flushed feeling.  Her symptoms were felt to be neurocardiogenic and she was asked to wear compression stockings (also to help with her varicose veins) and increase her fluid intake.  Lab testing for pheochromocytoma and carcinoid were negative.  She underwent Lexiscan Cardiolite 04/2015 that was negative for ischemia and echocardiogram 05/2015 revealed grade 1 diastolic dysfunction.  Her blood pressure was intermittently elevated. She reported having a lot of anxiety and stress at the time. She was instructed to follow-up with her PCP or psychiatrist to discuss her anxiety.  Her blood pressure remained elevated so she was started on lisinopril. Her blood pressure is been well-hydrated controlled since that time. In the interim since her last appointment she quit her job. She was having a lot of stress at work. She wonders how much this is improved her blood pressure. She hasn't been expressing chest pain, shortness of breath, or extremity edema, or flushing.   Past Medical History:  Diagnosis Date  . Anemia   . ANEMIA-IRON DEFICIENCY 01/03/2007  . Anxiety   . ANXIETY 01/03/2007  . Depression   . DEPRESSION 01/03/2007  . DJD (degenerative joint disease)    knee  . GERD 01/16/2009  . GERD (gastroesophageal reflux disease)   . Headache(784.0) 01/03/2007    . Hyperplastic colon polyp    04/2010  . HYPERSOMNIA 04/04/2010  . IBS 01/16/2009  . IBS (irritable bowel syndrome)   . Impaired glucose tolerance 05/18/2011  . Menopause    early  . Migraine   . Obesity   . 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 Wheezing 07/02/2010    Past Surgical History:  Procedure Laterality Date  . ABDOMINAL HYSTERECTOMY    . APPENDECTOMY    . bladder tac    . CHOLECYSTECTOMY    . KNEE ARTHROSCOPY    . OOPHORECTOMY       Current Outpatient Prescriptions  Medication Sig Dispense Refill  . cyclobenzaprine (FLEXERIL) 5 MG tablet Take 1 tablet (5 mg total) by mouth 3 (three) times daily as needed for muscle spasms. 30 tablet 1  . esomeprazole (NEXIUM) 20 MG capsule Take 20 mg by mouth daily at 12 noon.    Marland Kitchen estradiol (ESTRACE) 2 MG tablet Take 2 mg by mouth every evening.    . gabapentin (NEURONTIN) 300 MG capsule Take 1 capsule by mouth daily as needed.    Marland Kitchen ibuprofen (ADVIL,MOTRIN) 200 MG tablet Take 400 mg by mouth every 6 (six) hours as needed for pain.    Marland Kitchen nystatin cream (MYCOSTATIN) Apply 1 application topically daily as needed.  1  . promethazine (PHENERGAN) 25 MG tablet Take 1 tablet (25 mg total) by mouth every 6 (six) hours as needed for nausea. 15 tablet 0  . ranitidine (ZANTAC) 300 MG capsule  Take 300 mg by mouth daily as needed for heartburn.     . triamcinolone cream (KENALOG) 0.1 % Apply 1 application topically daily as needed.  1  . montelukast (SINGULAIR) 5 MG chewable tablet Chew 1 tablet by mouth as needed.    . NEOMYCIN-POLYMYXIN-HYDROCORTISONE (CORTISPORIN) 1 % SOLN otic solution Place 1-2 drops into the right ear as needed.     No current facility-administered medications for this visit.     Allergies:   Meperidine; Morphine; Sucralfate; Azithromycin; Codeine; Oxycodone-acetaminophen; Penicillins; Sucralfate; Amoxicillin; Hydrocodone; Omeprazole; Oxycodone-acetaminophen; and Tramadol    Social History:  The patient  reports  that she has never smoked. She has never used smokeless tobacco. She reports that she does not drink alcohol or use drugs.   Family History:  The patient's family history includes Diabetes in her brother; Hypertension in her mother.    ROS:  Please see the history of present illness.   Otherwise, review of systems are positive for none.   All other systems are reviewed and negative.    PHYSICAL EXAM: VS:  BP 117/75   Pulse 71   Ht 5\' 5"  (1.651 m)   Wt 100.7 kg (222 lb)   BMI 36.94 kg/m  , BMI Body mass index is 36.94 kg/m. GENERAL:  Well appearing.  Slightly anxious. HEENT:  Pupils equal round and reactive, fundi not visualized, oral mucosa unremarkable NECK:  No jugular venous distention, waveform within normal limits, carotid upstroke brisk and symmetric, no bruits LYMPHATICS:  No cervical adenopathy LUNGS:  Clear to auscultation bilaterally HEART:  RRR.  PMI not displaced or sustained,S1 and S2 within normal limits, no S3, no S4, no clicks, no rubs, no murmurs ABD:  Flat, positive bowel sounds normal in frequency in pitch, no bruits, no rebound, no guarding, no midline pulsatile mass, no hepatomegaly, no splenomegaly EXT:  2 plus pulses throughout, no edema, no cyanosis no clubbing SKIN:  No rashes no nodules NEURO:  Cranial nerves II through XII grossly intact, motor grossly intact throughout PSYCH:  Cognitively intact, oriented to person place and time   EKG:  EKG is ordered today. 01/12/16: Sinus bradycardia.  Rate 56 bpm.  08/02/16: Sinus rhythm.  Rate 78 bpm.    Echo 06/07/15: Study Conclusions  - Left ventricle: The cavity size was normal. There was mild focal basal hypertrophy of the septum. Systolic function was vigorous. The estimated ejection fraction was in the range of 65% to 70%. Wall motion was normal; there were no regional wall motion abnormalities. Doppler parameters are consistent with abnormal left ventricular relaxation (grade 1 diastolic  dysfunction). There was no evidence of elevated ventricular filling pressure by Doppler parameters. - Aortic valve: Trileaflet; normal thickness leaflets. There was no regurgitation. - Aortic root: The aortic root was normal in size. - Ascending aorta: The ascending aorta was normal in size. - Mitral valve: There was trivial regurgitation. - Left atrium: The atrium was normal in size. - Right ventricle: Systolic function was normal. - Tricuspid valve: There was trivial regurgitation. - Pulmonic valve: There was no regurgitation. - Pulmonary arteries: Systolic pressure was within the normal range. - Inferior vena cava: The vessel was normal in size. - Pericardium, extracardiac: There was no pericardial effusion.  Lexiscan Cardiolite 05/24/15:  Nuclear stress EF: 73%.  The left ventricular ejection fraction is hyperdynamic (>65%).  There was no ST segment deviation noted during stress.  The study is normal.   Recent Labs: 02/09/2016: ALT 11; Hemoglobin 13.2; Platelets 201.0; TSH 1.91  08/14/2016: BUN 14; Creatinine, Ser 0.79; Potassium 3.8; Sodium 133    Lipid Panel    Component Value Date/Time   CHOL 159 02/09/2016 0734   TRIG 226.0 (H) 02/09/2016 0734   HDL 49.00 02/09/2016 0734   CHOLHDL 3 02/09/2016 0734   VLDL 45.2 (H) 02/09/2016 0734   LDLCALC 96 04/02/2010 0850   LDLDIRECT 78.0 02/09/2016 0734      Wt Readings from Last 3 Encounters:  09/13/16 100.7 kg (222 lb)  08/02/16 102.1 kg (225 lb)  02/29/16 108.9 kg (240 lb)      ASSESSMENT AND PLAN:  # Hypertension:  Blood pressure is well-controlled.  She thinks that her hypertension was due to work stress and she is no longer working.  She will stop lisinopril 10 mg daily and check her BP daily.  She'll bring a log to her follow up appointment.   # Anxiety: I suspect this is the main cause of her chest pain.  Cardiac testing has been unremarkable.  # Dizziness: Resolved. Stress test and echo were  unremarkable.     Current medicines are reviewed at length with the patient today.  The patient does not have concerns regarding medicines.  The following changes have been made:  Stop lisinopril.  Labs/ tests ordered today include:   No orders of the defined types were placed in this encounter.     Disposition:   FU with [place] Kievit C. Duke Salviaandolph, MD in 1 month   Signed, Chilton Siiffany [place], MD  09/13/2016 5:46 PM    [facility]

## 2016-09-13 NOTE — Patient Instructions (Signed)
Medication Instructions:  STOP LISINOPRIL   Labwork: NONE  Testing/Procedures: NONE  Follow-Up: Your physician recommends that you schedule a follow-up appointment in: 1 MONTH OV  Any Other Special Instructions Will Be Listed Below (If Applicable). MONITOR YOUR BLOOD PRESSURE AND BRING TO YOUR FOLLOW UP OV  If you need a refill on your cardiac medications before your next appointment, please call your pharmacy.

## 2016-10-14 ENCOUNTER — Ambulatory Visit: Payer: 59 | Admitting: Cardiovascular Disease

## 2016-11-21 ENCOUNTER — Ambulatory Visit: Payer: 59 | Admitting: Cardiovascular Disease

## 2017-01-10 ENCOUNTER — Encounter: Payer: Self-pay | Admitting: Cardiovascular Disease

## 2017-01-10 ENCOUNTER — Ambulatory Visit (INDEPENDENT_AMBULATORY_CARE_PROVIDER_SITE_OTHER): Payer: 59 | Admitting: Cardiovascular Disease

## 2017-01-10 VITALS — BP 127/76 | HR 67 | Ht 66.0 in | Wt 234.0 lb

## 2017-01-10 DIAGNOSIS — R0789 Other chest pain: Secondary | ICD-10-CM | POA: Diagnosis not present

## 2017-01-10 DIAGNOSIS — R55 Syncope and collapse: Secondary | ICD-10-CM | POA: Diagnosis not present

## 2017-01-10 DIAGNOSIS — I10 Essential (primary) hypertension: Secondary | ICD-10-CM | POA: Diagnosis not present

## 2017-01-10 DIAGNOSIS — I739 Peripheral vascular disease, unspecified: Secondary | ICD-10-CM

## 2017-01-10 MED ORDER — LOSARTAN POTASSIUM 25 MG PO TABS: 25.0000 mg | ORAL_TABLET | Freq: Every day | ORAL | 3 refills | Status: DC

## 2017-01-10 NOTE — Patient Instructions (Addendum)
Medication Instructions:  STOP LISINOPRIL   START LOSARTAN 25 MG DAILY   Labwork: NONE  Testing/Procedures: Your physician has requested that you have an ankle brachial index (ABI). During this test an ultrasound and blood pressure cuff are used to evaluate the arteries that supply the arms and legs with blood. Allow thirty minutes for this exam. There are no restrictions or special instructions.  Follow-Up: Your physician recommends that you schedule a follow-up appointment in: 4 MONTH OV  If you need a refill on your cardiac medications before your next appointment, please call your pharmacy.

## 2017-01-10 NOTE — Progress Notes (Signed)
Cardiology Office Note   Date:  01/10/2017   ID:  [name], [name] January 11, 1961, MRN [id number]  PCP:  Corwin Levins, MD  Cardiologist:   Chilton Si, MD   Chief Complaint  [name] presents with  . Follow-up    4 weeks;  . Edema    in ankles.   . Leg Pain    muscles cramping in legs.     History of Present Illness: [name] is a 56 y.o. female with hypertension, gastroesophageal reflux disease and depression who presents for follow up.  [name] was referred 04/2015 or a complaint of dizziness and dyspnea on exertion.  EKG showed sinus rhythm with low voltage and poor R wave progression.  BNP was 17.  Stacey episodes occurred with exertion and were associated with a flushed feeling.  Her symptoms were felt to be neurocardiogenic and she was asked to wear compression stockings (also to help with her varicose veins) and increase her fluid intake.  Lab testing for pheochromocytoma and carcinoid were negative.  She underwent Lexiscan Cardiolite 04/2015 that was negative for ischemia and echocardiogram 05/2015 revealed grade 1 diastolic dysfunction.  Her blood pressure was intermittently elevated. She reported having a lot of anxiety and stress at Stacey time. She was instructed to follow-up with her PCP or psychiatrist to discuss her anxiety.  Her blood pressure remained elevated so she was started on lisinopril.  At her last appointment [name]'s blood pressure was no longer elevated. Lisinopril was discontinued and she was asked to a log of her blood pressures.  She has noted a cough since taking lisinopril. However she continues to take it because her has been stressed.  Both her daughter and her brother died recently. She denies any chest pain or shortness of breath.  She had an episode of dizziness and lightheadedness last week.  She laid down and Stacey episode passed after several hours.  There is no associated chest pain or palpitations. She denies syncope. She does  complain of cramping and numbness in bilateral feet and legs. This occurs both at rest and with exertion.  She denies lower extremity edema, orthopnea, or PND.  She has been doing yoga for exercise and feels good with these activities. Her main limitation is back pain.   Past Medical History:  Diagnosis Date  . Anemia   . ANEMIA-IRON DEFICIENCY 01/03/2007  . Anxiety   . ANXIETY 01/03/2007  . Depression   . DEPRESSION 01/03/2007  . DJD (degenerative joint disease)    knee  . GERD 01/16/2009  . GERD (gastroesophageal reflux disease)   . Headache(784.0) 01/03/2007  . Hyperplastic colon polyp    04/2010  . HYPERSOMNIA 04/04/2010  . IBS 01/16/2009  . IBS (irritable bowel syndrome)   . Impaired glucose tolerance 05/18/2011  . Menopause    early  . Migraine   . Obesity   . 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 Wheezing 07/02/2010    Past Surgical History:  Procedure Laterality Date  . ABDOMINAL HYSTERECTOMY    . APPENDECTOMY    . bladder tac    . CHOLECYSTECTOMY    . KNEE ARTHROSCOPY    . OOPHORECTOMY       Current Outpatient Prescriptions  Medication Sig Dispense Refill  . esomeprazole (NEXIUM) 20 MG capsule Take 20 mg by mouth daily at 12 noon.    Marland Kitchen estradiol (ESTRACE) 2 MG tablet Take 2 mg by mouth every evening.    . gabapentin (NEURONTIN)  300 MG capsule Take 1 capsule by mouth daily as needed.    Marland Kitchen ibuprofen (ADVIL,MOTRIN) 200 MG tablet Take 400 mg by mouth every 6 (six) hours as needed for pain.    . montelukast (SINGULAIR) 5 MG chewable tablet Chew 1 tablet by mouth as needed.    . NEOMYCIN-POLYMYXIN-HYDROCORTISONE (CORTISPORIN) 1 % SOLN otic solution Place 1-2 drops into Stacey right ear as needed.    . nystatin cream (MYCOSTATIN) Apply 1 application topically daily as needed.  1  . promethazine (PHENERGAN) 25 MG tablet Take 1 tablet (25 mg total) by mouth every 6 (six) hours as needed for nausea. 15 tablet 0  . triamcinolone cream (KENALOG) 0.1 % Apply 1 application  topically daily as needed.  1   No current facility-administered medications for this visit.     Allergies:   Meperidine; Morphine; Sucralfate; Azithromycin; Codeine; Oxycodone-acetaminophen; Penicillins; Sucralfate; Amoxicillin; Hydrocodone; Omeprazole; Oxycodone-acetaminophen; and Tramadol    Social History:  [name]  reports that she has never smoked. She has never used smokeless tobacco. She reports that she does not drink alcohol or use drugs.   Family History:  [name]'s family history includes Diabetes in her brother; Hypertension in her mother.    ROS:  Please see Stacey history of present illness.   Otherwise, review of systems are positive for none.   All other systems are reviewed and negative.    PHYSICAL EXAM: VS:  BP 127/76   Pulse 67   Ht 5\' 6"  (1.676 m)   Wt 106.1 kg (234 lb)   BMI 37.77 kg/m  , BMI Body mass index is 37.77 kg/m. GENERAL:  Well appearing HEENT: Pupils equal round and reactive, fundi not visualized, oral mucosa unremarkable NECK:  No jugular venous distention, waveform within normal limits, carotid upstroke brisk and symmetric, no bruits, no thyromegaly LYMPHATICS:  No cervical adenopathy LUNGS:  Clear to auscultation bilaterally HEART:  RRR.  PMI not displaced or sustained,S1 and S2 within normal limits, no S3, no S4, no clicks, no rubs, no murmurs ABD:  Flat, positive bowel sounds normal in frequency in pitch, no bruits, no rebound, no guarding, no midline pulsatile mass, no hepatomegaly, no splenomegaly EXT:  1+ DP/PT bilaterally. , no edema, no cyanosis no clubbing SKIN:  No rashes no nodules NEURO:  Cranial nerves II through XII grossly intact, motor grossly intact throughout PSYCH:  Cognitively intact, oriented to person place and time   EKG:  EKG is not ordered today. 01/12/16: Sinus bradycardia.  Rate 56 bpm.  08/02/16: Sinus rhythm.  Rate 78 bpm.    Echo 06/07/15: Study Conclusions  - Left ventricle: Stacey cavity size was normal.  There was mild focal basal hypertrophy of Stacey septum. Systolic function was vigorous. Stacey estimated ejection fraction was in Stacey range of 65% to 70%. Wall motion was normal; there were no regional wall motion abnormalities. Doppler parameters are consistent with abnormal left ventricular relaxation (grade 1 diastolic dysfunction). There was no evidence of elevated ventricular filling pressure by Doppler parameters. - Aortic valve: Trileaflet; normal thickness leaflets. There was no regurgitation. - Aortic root: Stacey aortic root was normal in size. - Ascending aorta: Stacey ascending aorta was normal in size. - Mitral valve: There was trivial regurgitation. - Left atrium: Stacey atrium was normal in size. - Right ventricle: Systolic function was normal. - Tricuspid valve: There was trivial regurgitation. - Pulmonic valve: There was no regurgitation. - Pulmonary arteries: Systolic pressure was within Stacey normal range. - Inferior vena  cava: Stacey vessel was normal in size. - Pericardium, extracardiac: There was no pericardial effusion.  Lexiscan Cardiolite 05/24/15:  Nuclear stress EF: 73%.  Stacey left ventricular ejection fraction is hyperdynamic (>65%).  There was no ST segment deviation noted during stress.  Stacey study is normal.   Recent Labs: 02/09/2016: ALT 11; Hemoglobin 13.2; Platelets 201.0; TSH 1.91 08/14/2016: BUN 14; Creatinine, Ser 0.79; Potassium 3.8; Sodium 133    Lipid Panel    Component Value Date/Time   CHOL 159 02/09/2016 0734   TRIG 226.0 (H) 02/09/2016 0734   HDL 49.00 02/09/2016 0734   CHOLHDL 3 02/09/2016 0734   VLDL 45.2 (H) 02/09/2016 0734   LDLCALC 96 04/02/2010 0850   LDLDIRECT 78.0 02/09/2016 0734      Wt Readings from Last 3 Encounters:  01/10/17 106.1 kg (234 lb)  09/13/16 100.7 kg (222 lb)  08/02/16 102.1 kg (225 lb)      ASSESSMENT AND PLAN:  # Hypertension:  Blood pressure is well-controlled.  She was previously instructed  to stop lisinopril. However she did not do this and her blood pressure is well controlled. We will switch lisinopril to losartan 25 mg daily due to cough.  # Chest pain: Resolved.  Her chest pain was associated with anxiety. Cardiac testing has been unremarkable.  # Dizziness: She had an episode last week. She is unsure what her blood pressure when his at that time. Stress test and echo were unremarkable.This was not positional and doesn't seem cardiac.   # Claudication: # Numbness:  Check ABIs.  Suspect this is related to her back but peripheral pulses are diminished.   Current medicines are reviewed at length with [name] today.  [name] does not have concerns regarding medicines.  Stacey following changes have been made:  Stop lisinopril. Start losartan 25 mg daily.   Labs/ tests ordered today include:   No orders of Stacey defined types were placed in this encounter.     Disposition:   FU with Wave Calzada C. Duke Salviaandolph, MD in 4 months   Signed, Chilton Siiffany Mahtomedi, MD  01/10/2017 9:00 AM    [facility]

## 2017-01-15 ENCOUNTER — Other Ambulatory Visit: Payer: Self-pay | Admitting: Cardiovascular Disease

## 2017-01-15 DIAGNOSIS — I739 Peripheral vascular disease, unspecified: Secondary | ICD-10-CM

## 2017-01-18 ENCOUNTER — Other Ambulatory Visit: Payer: Self-pay | Admitting: Cardiovascular Disease

## 2017-01-20 ENCOUNTER — Other Ambulatory Visit: Payer: Self-pay | Admitting: Cardiovascular Disease

## 2017-01-20 NOTE — Telephone Encounter (Signed)
Please review for refill, thanks ! 

## 2017-01-20 NOTE — Telephone Encounter (Signed)
Please review for refill, Thanks !  

## 2017-01-30 ENCOUNTER — Ambulatory Visit (HOSPITAL_COMMUNITY)
Admission: RE | Admit: 2017-01-30 | Discharge: 2017-01-30 | Disposition: A | Discharge status: Home / Self Care | Payer: 59 | Source: Ambulatory Visit | Attending: Internal Medicine | Admitting: Internal Medicine

## 2017-01-30 DIAGNOSIS — I739 Peripheral vascular disease, unspecified: Secondary | ICD-10-CM

## 2017-02-18 ENCOUNTER — Other Ambulatory Visit: Payer: Self-pay | Admitting: Internal Medicine

## 2017-02-18 ENCOUNTER — Telehealth: Payer: Self-pay | Admitting: Internal Medicine

## 2017-02-18 DIAGNOSIS — E559 Vitamin D deficiency, unspecified: Secondary | ICD-10-CM

## 2017-02-18 NOTE — Telephone Encounter (Signed)
Ok, this is done 

## 2017-02-18 NOTE — Telephone Encounter (Signed)
Pt called asking if a Vitamin D level could be added to her labs that she will be having done prior to her physical.

## 2017-02-28 ENCOUNTER — Other Ambulatory Visit (INDEPENDENT_AMBULATORY_CARE_PROVIDER_SITE_OTHER): Payer: 59

## 2017-02-28 DIAGNOSIS — Z0001 Encounter for general adult medical examination with abnormal findings: Secondary | ICD-10-CM | POA: Diagnosis not present

## 2017-02-28 DIAGNOSIS — E559 Vitamin D deficiency, unspecified: Secondary | ICD-10-CM | POA: Diagnosis not present

## 2017-02-28 DIAGNOSIS — R739 Hyperglycemia, unspecified: Secondary | ICD-10-CM

## 2017-02-28 LAB — URINALYSIS, ROUTINE W REFLEX MICROSCOPIC
BILIRUBIN URINE: NEGATIVE
HGB URINE DIPSTICK: NEGATIVE
KETONES UR: NEGATIVE
NITRITE: NEGATIVE
PH: 6.5 (ref 5.0–8.0)
RBC / HPF: NONE SEEN (ref 0–?)
Specific Gravity, Urine: 1.02 (ref 1.000–1.030)
Urine Glucose: NEGATIVE
Urobilinogen, UA: 0.2 (ref 0.0–1.0)

## 2017-02-28 LAB — BASIC METABOLIC PANEL
BUN: 14 mg/dL (ref 6–23)
CHLORIDE: 101 meq/L (ref 96–112)
CO2: 30 meq/L (ref 19–32)
Calcium: 8.7 mg/dL (ref 8.4–10.5)
Creatinine, Ser: 0.9 mg/dL (ref 0.40–1.20)
GFR: 68.49 mL/min (ref >60.00)
GLUCOSE: 134 mg/dL — AB (ref 70–99)
POTASSIUM: 3.7 meq/L (ref 3.5–5.1)
SODIUM: 137 meq/L (ref 135–145)

## 2017-02-28 LAB — LIPID PANEL
Cholesterol: 162 mg/dL (ref 0–200)
HDL: 40 mg/dL (ref >39.00)
NonHDL: 121.79
Total CHOL/HDL Ratio: 4
Triglycerides: 310 mg/dL — ABNORMAL HIGH (ref 0.0–149.0)
VLDL: 62 mg/dL — ABNORMAL HIGH (ref 0.0–40.0)

## 2017-02-28 LAB — CBC WITH DIFFERENTIAL/PLATELET
BASOS PCT: 0.4 % (ref 0.0–3.0)
Basophils Absolute: 0 10*3/uL (ref 0.0–0.1)
EOS PCT: 2.8 % (ref 0.0–5.0)
Eosinophils Absolute: 0.3 10*3/uL (ref 0.0–0.7)
HCT: 40.3 % (ref 36.0–46.0)
HEMOGLOBIN: 13.4 g/dL (ref 12.0–15.0)
LYMPHS ABS: 3.7 10*3/uL (ref 0.7–4.0)
Lymphocytes Relative: 34.8 % (ref 12.0–46.0)
MCHC: 33.2 g/dL (ref 30.0–36.0)
MCV: 89.9 fl (ref 78.0–100.0)
MONO ABS: 0.7 10*3/uL (ref 0.1–1.0)
Monocytes Relative: 6.6 % (ref 3.0–12.0)
Neutro Abs: 5.9 10*3/uL (ref 1.4–7.7)
Neutrophils Relative %: 55.4 % (ref 43.0–77.0)
Platelets: 199 10*3/uL (ref 150.0–400.0)
RBC: 4.48 Mil/uL (ref 3.87–5.11)
RDW: 13.1 % (ref 11.5–15.5)
WBC: 10.7 10*3/uL — ABNORMAL HIGH (ref 4.0–10.5)

## 2017-02-28 LAB — HEPATIC FUNCTION PANEL
ALBUMIN: 3.4 g/dL — AB (ref 3.5–5.2)
ALK PHOS: 40 U/L (ref 39–117)
ALT: 9 U/L (ref 0–35)
AST: 9 U/L (ref 0–37)
Bilirubin, Direct: 0 mg/dL (ref 0.0–0.3)
TOTAL PROTEIN: 6.1 g/dL (ref 6.0–8.3)
Total Bilirubin: 0.4 mg/dL (ref 0.2–1.2)

## 2017-02-28 LAB — VITAMIN D 25 HYDROXY (VIT D DEFICIENCY, FRACTURES): VITD: 22.51 ng/mL — AB (ref 30.00–100.00)

## 2017-02-28 LAB — HEMOGLOBIN A1C: HEMOGLOBIN A1C: 6.3 % (ref 4.6–6.5)

## 2017-02-28 LAB — LDL CHOLESTEROL, DIRECT: LDL DIRECT: 75 mg/dL

## 2017-02-28 LAB — TSH: TSH: 2.08 u[IU]/mL (ref 0.35–4.50)

## 2017-03-05 ENCOUNTER — Encounter: Payer: Self-pay | Admitting: Internal Medicine

## 2017-03-05 ENCOUNTER — Ambulatory Visit (INDEPENDENT_AMBULATORY_CARE_PROVIDER_SITE_OTHER): Payer: 59 | Admitting: Internal Medicine

## 2017-03-05 VITALS — BP 124/82 | HR 77 | Temp 98.6°F | Ht 66.0 in | Wt 243.0 lb

## 2017-03-05 DIAGNOSIS — Z114 Encounter for screening for human immunodeficiency virus [HIV]: Secondary | ICD-10-CM | POA: Diagnosis not present

## 2017-03-05 DIAGNOSIS — Z0000 Encounter for general adult medical examination without abnormal findings: Secondary | ICD-10-CM | POA: Diagnosis not present

## 2017-03-05 DIAGNOSIS — E559 Vitamin D deficiency, unspecified: Secondary | ICD-10-CM | POA: Diagnosis not present

## 2017-03-05 DIAGNOSIS — Z23 Encounter for immunization: Secondary | ICD-10-CM | POA: Diagnosis not present

## 2017-03-05 DIAGNOSIS — I10 Essential (primary) hypertension: Secondary | ICD-10-CM | POA: Diagnosis not present

## 2017-03-05 DIAGNOSIS — R7302 Impaired glucose tolerance (oral): Secondary | ICD-10-CM | POA: Diagnosis not present

## 2017-03-05 DIAGNOSIS — R739 Hyperglycemia, unspecified: Secondary | ICD-10-CM | POA: Diagnosis not present

## 2017-03-05 DIAGNOSIS — E8940 Asymptomatic postprocedural ovarian failure: Secondary | ICD-10-CM

## 2017-03-05 DIAGNOSIS — G8929 Other chronic pain: Secondary | ICD-10-CM

## 2017-03-05 DIAGNOSIS — E2839 Other primary ovarian failure: Secondary | ICD-10-CM | POA: Diagnosis not present

## 2017-03-05 MED ORDER — VITAMIN D (ERGOCALCIFEROL) 1.25 MG (50000 UNIT) PO CAPS: 50000.0000 [IU] | ORAL_CAPSULE | ORAL | 0 refills | Status: DC

## 2017-03-05 NOTE — Assessment & Plan Note (Signed)
Asympt,  Lab Results  Component Value Date   HGBA1C 6.3 02/28/2017   stable overall by history and exam, recent data reviewed with pt, and pt to continue medical treatment as before,  to f/u any worsening symptoms or concerns

## 2017-03-05 NOTE — Assessment & Plan Note (Signed)
For 50K x 12 wks, then 2000 units per day

## 2017-03-05 NOTE — Assessment & Plan Note (Signed)
Wt Readings from Last 3 Encounters:  03/05/17 243 lb (110.2 kg)  01/10/17 234 lb (106.1 kg)  09/13/16 222 lb (100.7 kg)   BP Readings from Last 3 Encounters:  03/05/17 124/82  01/10/17 127/76  09/13/16 117/75  to work on wt loss, cont same med tx

## 2017-03-05 NOTE — Assessment & Plan Note (Signed)

## 2017-03-05 NOTE — Assessment & Plan Note (Signed)
On HRT but will need dxa

## 2017-03-05 NOTE — Progress Notes (Signed)
Subjective:    Patient ID: [name], female    DOB: 1960-07-06, 56 y.o.   MRN: [id number]  HPI  Here for wellness and f/u;  Overall doing ok;  Pt denies Chest pain, worsening SOB, DOE, wheezing, orthopnea, PND, worsening LE edema, palpitations, dizziness or syncope.  Pt denies neurological change such as new headache, facial or extremity weakness.  Pt denies polydipsia, polyuria, or low sugar symptoms. Pt states overall good compliance with treatment and medications, good tolerability, and has been trying to follow appropriate diet.  Pt denies worsening depressive symptoms, suicidal ideation or panic. No fever, night sweats, wt loss, loss of appetite, or other constitutional symptoms.  Pt states good ability with ADL's, has low fall risk, home safety reviewed and adequate, no other significant changes in hearing or vision, and only occasionally active with exercise. Pt continues to have recurring LBP without change in severity, bowel or bladder change, fever, wt loss,  worsening LE pain/numbness/weakness, gait change or falls, has right sciatica like symptoms she states from lumbar spinal stenosis,, follows with Dr Margaretha Sheffield, limits her activity, will seek further tx there.  Has hx of vit d deficiency, asks for repeat tx.  . Past Medical History:  Diagnosis Date  . Anemia   . ANEMIA-IRON DEFICIENCY 01/03/2007  . Anxiety   . ANXIETY 01/03/2007  . Depression   . DEPRESSION 01/03/2007  . DJD (degenerative joint disease)    knee  . GERD 01/16/2009  . GERD (gastroesophageal reflux disease)   . Headache(784.0) 01/03/2007  . Hyperplastic colon polyp    04/2010  . HYPERSOMNIA 04/04/2010  . IBS 01/16/2009  . IBS (irritable bowel syndrome)   . Impaired glucose tolerance 05/18/2011  . Menopause    early  . Migraine   . Obesity   . 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 Wheezing 07/02/2010   Past Surgical History:  Procedure Laterality Date  . ABDOMINAL HYSTERECTOMY    . APPENDECTOMY    .  bladder tac    . CHOLECYSTECTOMY    . KNEE ARTHROSCOPY    . OOPHORECTOMY      reports that she has never smoked. She has never used smokeless tobacco. She reports that she does not drink alcohol or use drugs. family history includes Diabetes in her brother; Hypertension in her mother. Allergies  Allergen Reactions  . Meperidine Shortness Of Breath  . Morphine Anaphylaxis  . Sucralfate Other (See Comments)    Blurry vision  . Azithromycin Nausea And Vomiting  . Codeine Nausea Only and Other (See Comments)    Hot flashes also  . Oxycodone-Acetaminophen Rash  . Penicillins Hives, Nausea Only and Swelling  . Sucralfate Rash  . Lisinopril Cough       . Amoxicillin Nausea Only  . Hydrocodone Itching, Nausea Only and Rash  . Omeprazole Itching and Rash  . Oxycodone-Acetaminophen Nausea Only  . Tramadol Itching, Nausea Only and Rash   Current Outpatient Prescriptions on File Prior to Visit  Medication Sig Dispense Refill  . esomeprazole (NEXIUM) 20 MG capsule Take 20 mg by mouth daily at 12 noon.    Marland Kitchen estradiol (ESTRACE) 2 MG tablet Take 2 mg by mouth every evening.    . gabapentin (NEURONTIN) 300 MG capsule Take 1 capsule by mouth daily as needed.    Marland Kitchen ibuprofen (ADVIL,MOTRIN) 200 MG tablet Take 400 mg by mouth every 6 (six) hours as needed for pain.    Marland Kitchen losartan (COZAAR) 25 MG tablet Take 1 tablet (25  mg total) by mouth daily. 90 tablet 3  . montelukast (SINGULAIR) 5 MG chewable tablet Chew 1 tablet by mouth as needed.    . NEOMYCIN-POLYMYXIN-HYDROCORTISONE (CORTISPORIN) 1 % SOLN otic solution Place 1-2 drops into the right ear as needed.    . nystatin cream (MYCOSTATIN) Apply 1 application topically daily as needed.  1  . promethazine (PHENERGAN) 25 MG tablet Take 1 tablet (25 mg total) by mouth every 6 (six) hours as needed for nausea. 15 tablet 0  . triamcinolone cream (KENALOG) 0.1 % Apply 1 application topically daily as needed.  1   No current facility-administered  medications on file prior to visit.    Review of Systems Constitutional: Negative for other unusual diaphoresis, sweats, appetite or weight changes HENT: Negative for other worsening hearing loss, ear pain, facial swelling, mouth sores or neck stiffness.   Eyes: Negative for other worsening pain, redness or other visual disturbance.  Respiratory: Negative for other stridor or swelling Cardiovascular: Negative for other palpitations or other chest pain  Gastrointestinal: Negative for worsening diarrhea or loose stools, blood in stool, distention or other pain Genitourinary: Negative for hematuria, flank pain or other change in urine volume.  Musculoskeletal: Negative for myalgias or other joint swelling.  Skin: Negative for other color change, or other wound or worsening drainage.  Neurological: Negative for other syncope or numbness. Hematological: Negative for other adenopathy or swelling Psychiatric/Behavioral: Negative for hallucinations, other worsening agitation, SI, self-injury, or new decreased concentration All other system neg per pt    Objective:   Physical Exam BP 124/82   Pulse 77   Temp 98.6 F (37 C) (Oral)   Ht  (1.676 m)   Wt 243 lb (110.2 kg)   SpO2 99%   BMI 39.22 kg/m  VS noted, morbid obese Constitutional: Pt is oriented to person, place, and time. Appears well-developed and well-nourished, in no significant distress and comfortable Head: Normocephalic and atraumatic  Eyes: Conjunctivae and EOM are normal. Pupils are equal, round, and reactive to light Right Ear: External ear normal without discharge Left Ear: External ear normal without discharge Nose: Nose without discharge or deformity Mouth/Throat: Oropharynx is without other ulcerations and moist  Neck: Normal range of motion. Neck supple. No JVD present. No tracheal deviation present or significant neck LA or mass Cardiovascular: Normal rate, regular rhythm, normal heart sounds and intact distal  pulses.   Pulmonary/Chest: WOB normal and breath sounds without rales or wheezing  Abdominal: Soft. Bowel sounds are normal. NT. No HSM  Musculoskeletal: Normal range of motion. Exhibits no edema Lymphadenopathy: Has no other cervical adenopathy.  Neurological: Pt is alert and oriented to person, place, and time. Pt has normal reflexes. No cranial nerve deficit. Motor grossly intact, Gait intact Skin: Skin is warm and dry. No rash noted or new ulcerations Psychiatric:  Has normal mood and affect. Behavior is normal without agitation No other exam findings Lab Results  Component Value Date   WBC 10.7 (H) 02/28/2017   HGB 13.4 02/28/2017   HCT 40.3 02/28/2017   PLT 199.0 02/28/2017   GLUCOSE 134 (H) 02/28/2017   CHOL 162 02/28/2017   TRIG 310.0 (H) 02/28/2017   HDL 40.00 02/28/2017   LDLDIRECT 75.0 02/28/2017   LDLCALC 96 04/02/2010   ALT 9 02/28/2017   AST 9 02/28/2017   NA 137 02/28/2017   K 3.7 02/28/2017   CL 101 02/28/2017   CREATININE 0.90 02/28/2017   BUN 14 02/28/2017   CO2 30 02/28/2017  TSH 2.08 02/28/2017   HGBA1C 6.3 02/28/2017       Assessment & Plan:

## 2017-03-05 NOTE — Patient Instructions (Addendum)
You had the flu shot today, and the Tdap  Please schedule the bone density test before leaving today at the scheduling desk (where you check out)  Please take all new medication as prescribed - the Vitamin D 50K units per wk for 12 wks, then take 2000 units Vit D3 indefinitely  Please continue all other medications as before, and refills have been done if requested.  Please have the pharmacy call with any other refills you may need.  Please continue your efforts at being more active, low cholesterol diet, and weight control.  You are otherwise up to date with prevention measures today.  Please keep your appointments with your specialists as you may have planned  Please return in 1 year for your yearly visit, or sooner if needed, with Lab testing done 3-5 days before

## 2017-03-05 NOTE — Assessment & Plan Note (Signed)
Has f/u with Dr Draper/orthop planned, cont same tx

## 2017-03-10 ENCOUNTER — Ambulatory Visit (INDEPENDENT_AMBULATORY_CARE_PROVIDER_SITE_OTHER): Payer: 59 | Admitting: Sports Medicine

## 2017-03-10 VITALS — BP 142/86 | Ht 66.0 in | Wt 240.0 lb

## 2017-03-10 DIAGNOSIS — M5136 Other intervertebral disc degeneration, lumbar region: Secondary | ICD-10-CM | POA: Diagnosis not present

## 2017-03-10 MED ORDER — KETOROLAC TROMETHAMINE 60 MG/2ML IM SOLN
60.0000 mg | Freq: Once | INTRAMUSCULAR | Status: AC
  Administered 2017-03-10: 60 mg via INTRAMUSCULAR

## 2017-03-10 MED ORDER — METHYLPREDNISOLONE ACETATE 80 MG/ML IJ SUSP
80.0000 mg | Freq: Once | INTRAMUSCULAR | Status: AC
  Administered 2017-03-10: 80 mg via INTRAMUSCULAR

## 2017-03-10 NOTE — Progress Notes (Signed)
[facility] [address] [place], [place] [address] Phone: [phone] Fax: [phone]   Patient Name: [name] Date of Birth: [birth year] Medical Record Number: [id number] Gender: female Date of Encounter: 03/10/2017  History of Present Illness:  [name] is a 56 y.o. very pleasant female patient who presents with the following:  Low back pain and associated radiation down her bilateral legs to the feet. This is been an ongoing problem for her for several years now. Previously seen at [facility] and at that time was receiving regularly scheduled epidural steroid injections. Was recommended to have surgery at one point. In 2007 had an MRI of her lumbar spine showing spinal stenosis at L4-L5, with bilateral L5 nerve root effacement left greater than the right and L3-S1 facet arthropathy. Recently has been seen by Dr. Regino Schultze at [facility] orth[facility] and 2 months ago was given a Dosepak, which provided some relief from her pain. One month ago she had another Dosepak and this did not improve her symptoms.    Past Medical, Surgical, Social, and Family History Reviewed. Medications and Allergies reviewed and all updated if necessary.  Review of Systems:  Pain with sleeping, coughing, sneezing but denies any incontinence, fevers, chills or weight change.   Physical Examination: There were no vitals filed for this visit. Vitals:   03/10/17 1407  Weight: 240 lb (108.9 kg)  Height:  (1.676 m)   Body mass index is 38.74 kg/m.  General: well appearing 56 year old female in NAD HEENT: AC/Herrick, EOMI Cardiac: well perfused Resp: NWOB Ext: No cyanosis clubbing or edema MSK:  No gross abnormalities or edema. Knee ROM limited due to pain, hip ROM grossly normal. TTP at greater trochanters bilaterally and at IT band on L side. Negative FADIR bilaterally. Straight leg raise positive bilaterally, R>L. Trunk extension and flexion elicits lower  back pain. Does have spinal TTP at L5-S1 with paraspinal tenderness from L3-S1. Limited ROM at SI joints bilaterally.  Vasc: 2+ palpable pulses at radial arteries Neuro: grossly normal, no changes in sensation, reflexes trace in bilateral achilles and patellar with normal motor exam aside from 4/5 strength in great toe on R side, grip strength 5/5,  Psych:  Normal mood and affect  Assessment and Plan: Radicular back pain, bilateral Significant history of multilevel degenerative disk disease, grade 1 spondylolisthesis L4-L5 and L5-S1 and concomitant facet arthropathy presenting with lower back pain with radiculopathy. Patient is not interested and repeated epidural injections or surgery at this time. Previously has had good relief with Depo-Medrol and Toradol shots. Currently has no concerning signs or symptoms like, fevers, chills, weight changes or incontinence. Had discussion with patient that ultimately she may need regularly scheduled shots for maintenance. Has agreed to receive Toradol and Depo-Medrol shots today. Will have her follow up in 4 weeks and if symptoms have not improved will consider getting MRI at that point. Will obtain medical records from [facility] and see if they ordered any imaging.   Gradie Butrick L. Myrtie Soman, MD [facility] Resident PGY-2 03/10/2017 4:56 PM   Patient seen and evaluated with the resident. I agree with the above plan of care. Patient has a known history of multilevel degenerative disc disease in her lumbar spine. She's done well with IM injections of Depo-Medrol and Toradol in the past. We will repeat those today. I would like to get the records from [facility] orthopedics and have the patient follow-up in 4 weeks. I did discuss the possibility  of updating her MRI at follow-up if her pain worsens. She is not interested in epidural injections nor and surgery. She understands that this is a chronic problem.  Addendum: I received the records from [facility]. An MRI done of her lumbar spine in 2016 show severe spinal stenosis at L4-L5. Therefore, I do not think we need to repeat her MRI. I'll discuss this with her at her follow-up visit in 4 weeks.

## 2017-03-10 NOTE — Patient Instructions (Signed)
[name], you were seen today for back pain that radiates down the back of your legs and into her feet.  Fortunately not having any significant weakness or other signs or symptoms of more concerning disease.  Unfortunately you have a bad back. The best thing we can do for you today is to give you a steroid shot and a pain medicine shot (Toradol). You had previously done well with these injections. Do not improve over the next 4 weeks and we will probably get an MRI to make sure that there hasn't been significant changes over the years.   Please follow-up in 4 weeks.  Esmeralda Malay L. Myrtie Soman, MD [facility] Resident PGY-2 03/10/2017 3:03 PM

## 2017-03-11 ENCOUNTER — Encounter: Payer: Self-pay | Admitting: Sports Medicine

## 2017-03-13 ENCOUNTER — Other Ambulatory Visit: Payer: Self-pay

## 2017-03-13 MED ORDER — IBUPROFEN 800 MG PO TABS: 800.0000 mg | ORAL_TABLET | Freq: Three times a day (TID) | ORAL | 0 refills | Status: DC | PRN

## 2017-03-14 ENCOUNTER — Inpatient Hospital Stay: Admission: RE | Admit: 2017-03-14 | Payer: 59 | Source: Ambulatory Visit

## 2017-04-04 ENCOUNTER — Ambulatory Visit (INDEPENDENT_AMBULATORY_CARE_PROVIDER_SITE_OTHER): Payer: 59 | Admitting: Sports Medicine

## 2017-04-04 VITALS — BP 151/78 | Ht 66.0 in | Wt 240.0 lb

## 2017-04-04 DIAGNOSIS — M48062 Spinal stenosis, lumbar region with neurogenic claudication: Secondary | ICD-10-CM | POA: Diagnosis not present

## 2017-04-04 DIAGNOSIS — M5136 Other intervertebral disc degeneration, lumbar region: Secondary | ICD-10-CM | POA: Diagnosis not present

## 2017-04-04 MED ORDER — KETOROLAC TROMETHAMINE 60 MG/2ML IM SOLN
60.0000 mg | Freq: Once | INTRAMUSCULAR | Status: AC
  Administered 2017-04-04: 60 mg via INTRAMUSCULAR

## 2017-04-04 MED ORDER — METHYLPREDNISOLONE ACETATE 80 MG/ML IJ SUSP
80.0000 mg | Freq: Once | INTRAMUSCULAR | Status: AC
  Administered 2017-04-04: 80 mg via INTRAMUSCULAR

## 2017-04-04 NOTE — Progress Notes (Signed)
  [name] comes in today for follow-up on chronic low back pain and bilateral leg radiculopathy. An MRI from 2016 showed severe spinal stenosis. She has been treated in the past with physical therapy, medications, epidurals, and activity modification. Unfortunately, her symptoms are starting to affect her daily activities. She's having difficulty sleeping. I did give her an IM injection of Depo-Medrol and Toradol at her last office visit and this was helpful. She is requesting a repeat injection today.  Physical exam was not repeated today. We simply talked about her ongoing low back pain due to her severe spinal stenosis. I think she would benefit from a consultation with Dr. Danielle Dess to discuss risks and benefits of surgical decompression and fusion. In the meantime, I will repeat her IM injections of Depo-Medrol and Toradol. I will also refer her back over for some limited physical therapy. Patient is in agreement with this plan. Her daughter was present with her for today's visit.  Total time spent with the patient was 15 minutes with greater than 50% of the time spent in face-to-face consultation discussing her spinal stenosis, including demonstrating on a spine model what this diagnosis looks like, as well as treatment going forward.

## 2017-04-07 ENCOUNTER — Ambulatory Visit: Payer: 59 | Admitting: Sports Medicine

## 2017-05-02 ENCOUNTER — Telehealth: Payer: Self-pay

## 2017-05-02 DIAGNOSIS — M5136 Other intervertebral disc degeneration, lumbar region: Secondary | ICD-10-CM

## 2017-05-02 NOTE — Telephone Encounter (Signed)
[facility]

## 2017-05-07 ENCOUNTER — Ambulatory Visit: Payer: 59 | Admitting: Cardiovascular Disease

## 2017-05-08 ENCOUNTER — Encounter: Payer: Self-pay | Admitting: Cardiovascular Disease

## 2017-05-08 ENCOUNTER — Ambulatory Visit (INDEPENDENT_AMBULATORY_CARE_PROVIDER_SITE_OTHER): Payer: 59 | Admitting: Cardiovascular Disease

## 2017-05-08 VITALS — BP 124/80 | HR 76 | Ht 66.0 in | Wt 241.8 lb

## 2017-05-08 DIAGNOSIS — I10 Essential (primary) hypertension: Secondary | ICD-10-CM

## 2017-05-08 DIAGNOSIS — R0789 Other chest pain: Secondary | ICD-10-CM

## 2017-05-08 NOTE — Patient Instructions (Signed)

## 2017-05-08 NOTE — Progress Notes (Signed)
Cardiology Office Note   Date:  05/08/2017   ID:  [name], DOB February 16, 1961, MRN [id number]  PCP:  Corwin Levins, MD  Cardiologist:   Chilton Si, MD   Chief Complaint  Patient presents with  . Follow-up    History of Present Illness: [name] is a 56 y.o. female with hypertension, gastroesophageal reflux disease and depression who presents for follow up.  Ms. Huneycutt was referred 04/2015 for a complaint of dizziness and dyspnea on exertion.  EKG showed sinus rhythm with low voltage and poor R wave progression.  BNP was 17.  The episodes occurred with exertion and were associated with a flushed feeling.  Her symptoms were felt to be neurocardiogenic and she was asked to wear compression stockings (also to help with her varicose veins) and increase her fluid intake.  Lab testing for pheochromocytoma and carcinoid were negative.  She underwent Lexiscan Cardiolite 04/2015 that was negative for ischemia and echocardiogram 05/2015 revealed grade 1 diastolic dysfunction.  Her blood pressure was intermittently elevated. She reported having a lot of anxiety and stress at the time. She was instructed to follow-up with her PCP or psychiatrist to discuss her anxiety.  Her blood pressure remained elevated so she was started on lisinopril.  This was switched to losartan due to cough.  At her last appointment she reported lower extremity numbness and claudication.  She was referred for ABIs 01/30/17 that were normal bilaterally.  Since her last appointment Ms. Stegenga has been feeling well from a heart standpoint.  She denies any chest pain or shortness of breath.  She also has not noted any palpitations or lightheadedness.  She continues to have sporadic episodes of feeling flushed but better than in the past.  She has been unable to exercise much due to pain in her back.  She also has sciatica radiating into her right buttock.  Her right leg and foot continues to be achy and cold.  She has  difficulty walking especially when going up inclines.  She is scheduled to see a neurosurgeon tomorrow.  However, she is not convinced that she wants to have surgery.  It was recommended that she try physical therapy but she is afraid that this will make it hurt worse.  She has not noted any lower extremity edema, orthopnea, or PND.  She is excited about the birth of her first grandson who is 7 weeks old.   Past Medical History:  Diagnosis Date  . Anemia   . ANEMIA-IRON DEFICIENCY 01/03/2007  . Anxiety   . ANXIETY 01/03/2007  . Depression   . DEPRESSION 01/03/2007  . DJD (degenerative joint disease)    knee  . GERD 01/16/2009  . GERD (gastroesophageal reflux disease)   . Headache(784.0) 01/03/2007  . Hyperplastic colon polyp    04/2010  . HYPERSOMNIA 04/04/2010  . IBS 01/16/2009  . IBS (irritable bowel syndrome)   . Impaired glucose tolerance 05/18/2011  . Menopause    early  . Migraine   . Obesity   . 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 Wheezing 07/02/2010    Past Surgical History:  Procedure Laterality Date  . ABDOMINAL HYSTERECTOMY    . APPENDECTOMY    . bladder tac    . CHOLECYSTECTOMY    . KNEE ARTHROSCOPY    . OOPHORECTOMY       Current Outpatient Medications  Medication Sig Dispense Refill  . esomeprazole (NEXIUM) 20 MG capsule Take 20 mg by mouth daily at  12 noon.    Marland Kitchen. estradiol (ESTRACE) 2 MG tablet Take 2 mg by mouth every evening.    Marland Kitchen. ibuprofen (ADVIL,MOTRIN) 800 MG tablet Take 1 tablet (800 mg total) by mouth 3 (three) times daily with meals as needed. 60 tablet 0  . losartan (COZAAR) 25 MG tablet Take 1 tablet (25 mg total) by mouth daily. 90 tablet 3  . NEOMYCIN-POLYMYXIN-HYDROCORTISONE (CORTISPORIN) 1 % SOLN otic solution Place 1-2 drops into the right ear as needed.    . nystatin cream (MYCOSTATIN) Apply 1 application topically daily as needed.  1  . promethazine (PHENERGAN) 25 MG tablet Take 1 tablet (25 mg total) by mouth every 6 (six) hours as  needed for nausea. 15 tablet 0  . triamcinolone cream (KENALOG) 0.1 % Apply 1 application topically daily as needed.  1  . Vitamin D, Ergocalciferol, (DRISDOL) 50000 units CAPS capsule Take 1 capsule (50,000 Units total) by mouth every 7 (seven) days. 12 capsule 0   No current facility-administered medications for this visit.     Allergies:   Meperidine; Morphine; Sucralfate; Azithromycin; Codeine; Oxycodone-acetaminophen; Penicillins; Sucralfate; Lisinopril; Amoxicillin; Hydrocodone; Omeprazole; Oxycodone-acetaminophen; and Tramadol    Social History:  The patient  reports that  has never smoked. she has never used smokeless tobacco. She reports that she does not drink alcohol or use drugs.   Family History:  The patient's family history includes Diabetes in her brother; Hypertension in her mother.    ROS:  Please see the history of present illness.   Otherwise, review of systems are positive for none.   All other systems are reviewed and negative.    PHYSICAL EXAM: VS:  BP 124/80   Pulse 76   Ht 5\' 6"  (1.676 m)   Wt 109.7 kg (241 lb 12.8 oz)   BMI 39.03 kg/m  , BMI Body mass index is 39.03 kg/m. GENERAL:  Well appearing HEENT: Pupils equal round and reactive, fundi not visualized, oral mucosa unremarkable NECK:  No jugular venous distention, waveform within normal limits, carotid upstroke brisk and symmetric, no bruits, no thyromegaly LUNGS:  Clear to auscultation bilaterally HEART:  RRR.  PMI not displaced or sustained,S1 and S2 within normal limits, no S3, no S4, no clicks, no rubs, no murmurs ABD:  Flat, positive bowel sounds normal in frequency in pitch, no bruits, no rebound, no guarding, no midline pulsatile mass, no hepatomegaly, no splenomegaly EXT:  2 plus radial pulses.  1+ DP/PT bilaterally.  No edema, no cyanosis no clubbing SKIN:  No rashes no nodules NEURO:  Cranial nerves II through XII grossly intact, motor grossly intact throughout PSYCH:  Cognitively intact,  oriented to person place and time    EKG:  EKG is ordered today. 01/12/16: Sinus bradycardia.  Rate 56 bpm.  08/02/16: Sinus rhythm.  Rate 78 bpm.   05/08/17: Sinus rhythm.  Rate 76 bpm.  Poor R wave progression.  Low voltage limb leads.  Echo 06/07/15: Study Conclusions  - Left ventricle: The cavity size was normal. There was mild focal basal hypertrophy of the septum. Systolic function was vigorous. The estimated ejection fraction was in the range of 65% to 70%. Wall motion was normal; there were no regional wall motion abnormalities. Doppler parameters are consistent with abnormal left ventricular relaxation (grade 1 diastolic dysfunction). There was no evidence of elevated ventricular filling pressure by Doppler parameters. - Aortic valve: Trileaflet; normal thickness leaflets. There was no regurgitation. - Aortic root: The aortic root was normal in size. - Ascending  aorta: The ascending aorta was normal in size. - Mitral valve: There was trivial regurgitation. - Left atrium: The atrium was normal in size. - Right ventricle: Systolic function was normal. - Tricuspid valve: There was trivial regurgitation. - Pulmonic valve: There was no regurgitation. - Pulmonary arteries: Systolic pressure was within the normal range. - Inferior vena cava: The vessel was normal in size. - Pericardium, extracardiac: There was no pericardial effusion.  Lexiscan Cardiolite 05/24/15:  Nuclear stress EF: 73%.  The left ventricular ejection fraction is hyperdynamic (>65%).  There was no ST segment deviation noted during stress.  The study is normal.  ABIs 01/30/17: normal  Recent Labs: 02/28/2017: ALT 9; BUN 14; Creatinine, Ser 0.90; Hemoglobin 13.4; Platelets 199.0; Potassium 3.7; Sodium 137; TSH 2.08    Lipid Panel    Component Value Date/Time   CHOL 162 02/28/2017 0818   TRIG 310.0 (H) 02/28/2017 0818   HDL 40.00 02/28/2017 0818   CHOLHDL 4 02/28/2017 0818   VLDL  62.0 (H) 02/28/2017 0818   LDLCALC 96 04/02/2010 0850   LDLDIRECT 75.0 02/28/2017 0818      Wt Readings from Last 3 Encounters:  05/08/17 109.7 kg (241 lb 12.8 oz)  04/04/17 108.9 kg (240 lb)  03/10/17 108.9 kg (240 lb)      ASSESSMENT AND PLAN:  # Hypertension:  Blood pressure is well-controlled. Continue losartan.  # Chest pain: Resolved.  Her chest pain was associated with anxiety. Cardiac testing has been unremarkable.   # Claudication: # Numbness: ABIs normal.  This is sciatica and back related.  She is seeing surgery tomorrow.  She is low risk for surgery if needed.    Current medicines are reviewed at length with the patient today.  The patient does not have concerns regarding medicines.  The following changes have been made:  none  Labs/ tests ordered today include:   No orders of the defined types were placed in this encounter.     Disposition:   FU with Ashira Kirsten C. Duke Salviaandolph, MD in 1 year.    Signed, Chilton Siiffany Paradise, MD  05/08/2017 3:37 PM    [facility]

## 2017-05-13 ENCOUNTER — Other Ambulatory Visit: Payer: Self-pay | Admitting: Neurosurgery

## 2017-05-13 DIAGNOSIS — M4310 Spondylolisthesis, site unspecified: Secondary | ICD-10-CM

## 2017-05-22 ENCOUNTER — Ambulatory Visit
Admission: RE | Admit: 2017-05-22 | Discharge: 2017-05-22 | Disposition: A | Discharge status: Home / Self Care | Payer: 59 | Source: Ambulatory Visit | Attending: Neurosurgery | Admitting: Neurosurgery

## 2017-05-22 DIAGNOSIS — M4310 Spondylolisthesis, site unspecified: Secondary | ICD-10-CM

## 2017-05-25 ENCOUNTER — Other Ambulatory Visit: Payer: Self-pay | Admitting: Internal Medicine

## 2017-08-10 ENCOUNTER — Other Ambulatory Visit: Payer: Self-pay | Admitting: Internal Medicine

## 2017-08-11 ENCOUNTER — Telehealth: Payer: Self-pay | Admitting: Internal Medicine

## 2017-08-11 DIAGNOSIS — N952 Postmenopausal atrophic vaginitis: Secondary | ICD-10-CM | POA: Diagnosis not present

## 2017-08-11 DIAGNOSIS — N760 Acute vaginitis: Secondary | ICD-10-CM | POA: Diagnosis not present

## 2017-08-11 DIAGNOSIS — N390 Urinary tract infection, site not specified: Secondary | ICD-10-CM | POA: Diagnosis not present

## 2017-08-11 NOTE — Telephone Encounter (Signed)
Copied from CRM [id number]. Topic: Quick Communication - See Telephone Encounter >> Aug 11, 2017 11:11 AM Landry MellowFoltz, Melissa J wrote: CRM for notification. See Telephone encounter for:   08/11/17.  Pt called to find out if you want to continue to take 50,000 u vit d, or change to otc, or have labs.  If you want to call in rx, [facility] please call [phone] to let pt know

## 2017-08-11 NOTE — Telephone Encounter (Signed)
Ok to change to OTC vit d 2000 units per day, and this can be revisited in the future with lab testing (but not needed now)

## 2017-08-11 NOTE — Telephone Encounter (Signed)
Pt has been informed and expressed understanding.  

## 2017-09-25 ENCOUNTER — Telehealth: Payer: Self-pay | Admitting: Sports Medicine

## 2017-09-25 ENCOUNTER — Ambulatory Visit
Admission: RE | Admit: 2017-09-25 | Discharge: 2017-09-25 | Disposition: A | Discharge status: Home / Self Care | Payer: 59 | Source: Ambulatory Visit | Attending: Sports Medicine | Admitting: Sports Medicine

## 2017-09-25 ENCOUNTER — Ambulatory Visit: Payer: 59 | Admitting: Sports Medicine

## 2017-09-25 VITALS — BP 143/59 | Ht 66.0 in | Wt 245.0 lb

## 2017-09-25 DIAGNOSIS — M1712 Unilateral primary osteoarthritis, left knee: Secondary | ICD-10-CM

## 2017-09-25 DIAGNOSIS — M25562 Pain in left knee: Secondary | ICD-10-CM

## 2017-09-25 MED ORDER — METHYLPREDNISOLONE ACETATE 40 MG/ML IJ SUSP
40.0000 mg | Freq: Once | INTRAMUSCULAR | Status: AC
  Administered 2017-09-25: 40 mg via INTRA_ARTICULAR

## 2017-09-25 NOTE — Telephone Encounter (Signed)
When you call patient with results, please use the mobile number.  Thank you.

## 2017-09-26 ENCOUNTER — Encounter: Payer: Self-pay | Admitting: Sports Medicine

## 2017-09-26 NOTE — Progress Notes (Signed)
   Subjective:    Patient ID: Stacey BombardSylvia B [name], female    DOB: 02/02/1961, 57 y.o.   MRN: [id number]  HPI complaint: Left knee pain  [name] comes in today complaining of two days of lateral left knee pain. Pain began acutely when walking. She describes a sharp stabbing pain that is present constantly with weightbearing.She does feel like the knee wants to give way from time to time.She has a history of knee osteoarthritis. She does endorse some slight swelling. No locking. No radiating pain. She denies pain medially. No recent x-rays of the left knee.  Interim medical history reviewed Medications reviewed Allergies reviewed    Review of Systems    as above Objective:   Physical Exam  Well-developed, well-nourished. No acute distress. Awake alert and oriented 3. Final signs reviewed.  Left knee: Range of motion is 0-120. 1+ boggy synovitis. She is tender to palpation along the lateral joint line with pain but no popping with McMurray's. Slight tenderness along the medial joint line. Knee is stable to valgus and varus stressing. 1-2+ patellofemoral crepitus. Neurovascularly intact distally. Walking with a slight limp.  Left knee x-rays show bone-on-bone medial compartmental DJD. 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 Definitive treatment is a total knee arthroplasty but the patient is not yet willing to consider that. She has done well with cortisone injections in the past. We decided to re-inject her knee today. I've encouraged her to use ice intermittently. If symptoms persist or worsen then we may need to refer her to orthopedics. Follow-up for ongoing or recalcitrant issues.  Consent obtained  and verified. Time-out conducted. Noted no overlying erythema, induration, or other signs of local infection. Skin prepped in a sterile fashion. Topical analgesic spray: Ethyl chloride. Joint: left knee, anterior lateral approach Needle: 25g 1.5 inch Completed without difficulty. Meds: 3cc 1% xylocaine, 1cc (40mg ) depomedrol  Advised to call if fevers/chills, erythema, induration, drainage, or persistent bleeding.

## 2017-10-09 ENCOUNTER — Other Ambulatory Visit: Payer: Self-pay | Admitting: Neurosurgery

## 2017-10-22 ENCOUNTER — Other Ambulatory Visit: Payer: Self-pay | Admitting: Cardiovascular Disease

## 2017-10-23 NOTE — Telephone Encounter (Signed)
Rx(s) sent to pharmacy electronically.  

## 2017-10-27 DIAGNOSIS — Z6841 Body Mass Index (BMI) 40.0 and over, adult: Secondary | ICD-10-CM | POA: Diagnosis not present

## 2017-10-27 DIAGNOSIS — Z01419 Encounter for gynecological examination (general) (routine) without abnormal findings: Secondary | ICD-10-CM | POA: Diagnosis not present

## 2017-10-28 DIAGNOSIS — M7752 Other enthesopathy of left foot: Secondary | ICD-10-CM | POA: Diagnosis not present

## 2017-10-28 DIAGNOSIS — G5762 Lesion of plantar nerve, left lower limb: Secondary | ICD-10-CM | POA: Diagnosis not present

## 2017-11-06 DIAGNOSIS — M7752 Other enthesopathy of left foot: Secondary | ICD-10-CM | POA: Diagnosis not present

## 2017-11-06 DIAGNOSIS — G5762 Lesion of plantar nerve, left lower limb: Secondary | ICD-10-CM | POA: Diagnosis not present

## 2017-11-06 DIAGNOSIS — L309 Dermatitis, unspecified: Secondary | ICD-10-CM | POA: Diagnosis not present

## 2017-11-20 DIAGNOSIS — Z6839 Body mass index (BMI) 39.0-39.9, adult: Secondary | ICD-10-CM | POA: Diagnosis not present

## 2017-11-20 DIAGNOSIS — K219 Gastro-esophageal reflux disease without esophagitis: Secondary | ICD-10-CM | POA: Diagnosis not present

## 2017-12-15 DIAGNOSIS — Z13820 Encounter for screening for osteoporosis: Secondary | ICD-10-CM | POA: Diagnosis not present

## 2017-12-24 ENCOUNTER — Encounter: Payer: Self-pay | Admitting: Internal Medicine

## 2017-12-24 ENCOUNTER — Ambulatory Visit (INDEPENDENT_AMBULATORY_CARE_PROVIDER_SITE_OTHER): Payer: 59 | Admitting: Internal Medicine

## 2017-12-24 VITALS — Ht 66.0 in | Wt 237.0 lb

## 2017-12-24 DIAGNOSIS — L237 Allergic contact dermatitis due to plants, except food: Secondary | ICD-10-CM

## 2017-12-24 DIAGNOSIS — R7302 Impaired glucose tolerance (oral): Secondary | ICD-10-CM | POA: Diagnosis not present

## 2017-12-24 DIAGNOSIS — I10 Essential (primary) hypertension: Secondary | ICD-10-CM | POA: Diagnosis not present

## 2017-12-24 DIAGNOSIS — L259 Unspecified contact dermatitis, unspecified cause: Secondary | ICD-10-CM | POA: Diagnosis not present

## 2017-12-24 MED ORDER — METHYLPREDNISOLONE ACETATE 80 MG/ML IJ SUSP
80.0000 mg | Freq: Once | INTRAMUSCULAR | Status: AC
  Administered 2017-12-24: 80 mg via INTRAMUSCULAR

## 2017-12-24 MED ORDER — TRIAMCINOLONE ACETONIDE 0.1 % EX CREA: 1.0000 "application " | TOPICAL_CREAM | Freq: Every day | CUTANEOUS | 1 refills | Status: DC | PRN

## 2017-12-24 MED ORDER — PREDNISONE 10 MG PO TABS: ORAL_TABLET | ORAL | 0 refills | Status: DC

## 2017-12-24 NOTE — Progress Notes (Signed)
Subjective:    Patient ID: Stacey BombardSylvia B [name], female    DOB: 06/20/1961, 57 y.o.   MRN: [id number]  HPI  Here after working in yard last 2 days, had itchy rash to hands with some start of blistering without pain or fever, but seeming now to spread to multiple other areas including whole right face, right arm and right leg, as well as few spots to the right hand as well.  Pt denies chest pain, increased sob or doe, wheezing, orthopnea, PND, increased LE swelling, palpitations, dizziness or syncope.   Pt denies polydipsia,  Past Medical History:  Diagnosis Date  . Anemia   . ANEMIA-IRON DEFICIENCY 01/03/2007  . Anxiety   . ANXIETY 01/03/2007  . Depression   . DEPRESSION 01/03/2007  . DJD (degenerative joint disease)    knee  . GERD 01/16/2009  . GERD (gastroesophageal reflux disease)   . Headache(784.0) 01/03/2007  . Hyperplastic colon polyp    04/2010  . HYPERSOMNIA 04/04/2010  . IBS 01/16/2009  . IBS (irritable bowel syndrome)   . Impaired glucose tolerance 05/18/2011  . Menopause    early  . Migraine   . Obesity   . 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 Wheezing 07/02/2010   Past Surgical History:  Procedure Laterality Date  . ABDOMINAL HYSTERECTOMY    . APPENDECTOMY    . bladder tac    . CHOLECYSTECTOMY    . KNEE ARTHROSCOPY    . OOPHORECTOMY      reports that she has never smoked. She has never used smokeless tobacco. She reports that she does not drink alcohol or use drugs. family history includes Diabetes in her brother; Hypertension in her mother. Allergies  Allergen Reactions  . Meperidine Shortness Of Breath  . Morphine Anaphylaxis  . Sucralfate Other (See Comments)    Blurry vision  . Azithromycin Nausea And Vomiting  . Codeine Nausea Only and Other (See Comments)    Hot flashes also  . Oxycodone-Acetaminophen Rash  . Penicillins Hives, Nausea Only and Swelling  . Sucralfate Rash  . Lisinopril Cough       . Amoxicillin Nausea Only  . Hydrocodone  Itching, Nausea Only and Rash  . Omeprazole Itching and Rash  . Oxycodone-Acetaminophen Nausea Only  . Tramadol Itching, Nausea Only and Rash   Current Outpatient Medications on File Prior to Visit  Medication Sig Dispense Refill  . esomeprazole (NEXIUM) 20 MG capsule Take 20 mg by mouth daily at 12 noon.    Marland Kitchen. losartan (COZAAR) 25 MG tablet TAKE 1 TABLET BY MOUTH EVERY DAY 90 tablet 2  . NEOMYCIN-POLYMYXIN-HYDROCORTISONE (CORTISPORIN) 1 % SOLN otic solution Place 1-2 drops into the right ear as needed.    . nystatin cream (MYCOSTATIN) Apply 1 application topically daily as needed.  1  . promethazine (PHENERGAN) 25 MG tablet Take 1 tablet (25 mg total) by mouth every 6 (six) hours as needed for nausea. 15 tablet 0  . estradiol (ESTRACE) 1 MG tablet      No current facility-administered medications on file prior to visit.    Review of Systems  Constitutional: Negative for other unusual diaphoresis or sweats HENT: Negative for ear discharge or swelling Eyes: Negative for other worsening visual disturbances Respiratory: Negative for stridor or other swelling  Gastrointestinal: Negative for worsening distension or other blood Genitourinary: Negative for retention or other urinary change Musculoskeletal: Negative for other MSK pain or swelling Skin: Negative for color change or other new lesions Neurological: Negative for worsening  tremors and other numbness  Psychiatric/Behavioral: Negative for worsening agitation or other fatigue All other system neg per pt    Objective:   Physical Exam Ht 5\' 6"  (1.676 m)   Wt 237 lb (107.5 kg)   BMI 38.25 kg/m  VS noted,  Constitutional: Pt appears in NAD HENT: Head: NCAT.  Right Ear: External ear normal.  Left Ear: External ear normal.  Eyes: . Pupils are equal, round, and reactive to light. Conjunctivae and EOM are normal Nose: without d/c or deformity Neck: Neck supple. Gross normal ROM Cardiovascular: Normal rate and regular rhythm.     Pulmonary/Chest: Effort normal and breath sounds without rales or wheezing.  Neurological: Pt is alert. At baseline orientation, motor grossly intact Skin: Skin is warm. + typical contact derm like poison ivy like rash to left hand, right face, right hand, right upper arm and right lower lateral leg, No other new lesions, no LE edema Psychiatric: Pt behavior is normal without agitation  No other exam findings    Assessment & Plan:

## 2017-12-24 NOTE — Assessment & Plan Note (Signed)
Mild to mod, for depomedrol IM 80, predpac asd, triam cr prn,  to f/u any worsening symptoms or concerns 

## 2017-12-24 NOTE — Patient Instructions (Signed)
You had the steroid shot today  Please take all new medication as prescribed - the prednisone, and the steroid cream as needed for the most itchy areas  Please continue all other medications as before, and refills have been done if requested.  Please have the pharmacy call with any other refills you may need.  Please keep your appointments with your specialists as you may have planned

## 2017-12-24 NOTE — Assessment & Plan Note (Signed)
Mild elevatedl likely reactive, o/w stable overall by history and exam, recent data reviewed with pt, and pt to continue medical treatment as before,  to f/u any worsening symptoms or concerns BP Readings from Last 3 Encounters:  09/25/17 (!) 143/59  05/08/17 124/80  04/04/17 (!) 151/78

## 2017-12-24 NOTE — Assessment & Plan Note (Signed)
stable overall by history and exam, recent data reviewed with pt, and pt to continue medical treatment as before,  to f/u any worsening symptoms or concerns Lab Results  Component Value Date   HGBA1C 6.3 02/28/2017   

## 2017-12-31 NOTE — Pre-Procedure Instructions (Signed)
[name]  12/31/2017      [id number] - [place], [place] - [address] AT [facility] [name] [place] [address] Phone: [phone] Fax: [phone]    Your procedure is scheduled on January 09, 2018.  Report to [facility] Admitting at 600 AM.  Call this number if you have problems the morning of surgery:  [phone]   Remember:  Do not eat or drink after midnight.    Take these medicines the morning of surgery with A SIP OF WATER  Tylenol-if needed Esomeprazole (nexium) Prednisone (deltasone) Promethazine (phenergan)-if needed for nausea  7 days prior to surgery STOP taking any Aspirin (unless otherwise instructed by your surgeon), Aleve, Naproxen, Ibuprofen, Motrin, Advil, Goody's, BC's, all herbal medications, fish oil, and all vitamins    Do not wear jewelry, make-up or nail polish.  Do not wear lotions, powders, or perfumes, or deodorant.  Do not shave 48 hours prior to surgery.    Do not bring valuables to the hospital.  [name] is not responsible for any belongings or valuables.  Contacts, dentures or bridgework may not be worn into surgery.  Leave your suitcase in the car.  After surgery it may be brought to your room.  For patients admitted to the hospital, discharge time will be determined by your treatment team.  Patients discharged the day of surgery will not be allowed to drive home.    [name]- Preparing For Surgery  Before surgery, you can play an important role. Because skin is not sterile, your skin needs to be as free of germs as possible. You can reduce the number of germs on your skin by washing with CHG (chlorahexidine gluconate) Soap before surgery.  CHG is an antiseptic cleaner which kills germs and bonds with the skin to continue killing germs even after washing.    Oral Hygiene is also important to reduce your risk of infection.  Remember - BRUSH YOUR TEETH THE  MORNING OF SURGERY WITH YOUR REGULAR TOOTHPASTE  Please do not use if you have an allergy to CHG or antibacterial soaps. If your skin becomes reddened/irritated stop using the CHG.  Do not shave (including legs and underarms) for at least 48 hours prior to first CHG shower. It is OK to shave your face.  Please follow these instructions carefully.   1. Shower the NIGHT BEFORE SURGERY and the MORNING OF SURGERY with CHG.   2. If you chose to wash your hair, wash your hair first as usual with your normal shampoo.  3. After you shampoo, rinse your hair and body thoroughly to remove the shampoo.  4. Use CHG as you would any other liquid soap. You can apply CHG directly to the skin and wash gently with a scrungie or a clean washcloth.   5. Apply the CHG Soap to your body ONLY FROM THE NECK DOWN.  Do not use on open wounds or open sores. Avoid contact with your eyes, ears, mouth and genitals (private parts). Wash Face and genitals (private parts)  with your normal soap.  6. Wash thoroughly, paying special attention to the area where your surgery will be performed.  7. Thoroughly rinse your body with warm water from the neck down.  8. DO NOT shower/wash with your normal soap after using and rinsing off the CHG Soap.  9. Pat yourself dry with a CLEAN TOWEL.  10. Wear CLEAN PAJAMAS to bed the night  before surgery, wear comfortable clothes the morning of surgery  11. Place CLEAN SHEETS on your bed the night of your first shower and DO NOT SLEEP WITH PETS.   Day of Surgery:  Do not apply any deodorants/lotions.  Please wear clean clothes to the hospital/surgery center.   Remember to brush your teeth WITH YOUR REGULAR TOOTHPASTE.   Please read over the following fact sheets that you were given. Pain Booklet, Coughing and Deep Breathing, MRSA Information and Surgical Site Infection Prevention

## 2017-12-31 NOTE — Progress Notes (Addendum)
PCP: Oliver BarreJohn James, MD  Cardiologist: Chilton Siiffany Scurry, MD  EKG: 04/2017 in EPIC  Stress test: 04/2015 in EPIC  ECHO: 05/2015 in EPIC  Cardiac Cath: pt denies ever  Chest x-ray: pt denies past year, no recent respiratory infections/complications

## 2018-01-01 ENCOUNTER — Other Ambulatory Visit (HOSPITAL_COMMUNITY): Payer: 59

## 2018-01-01 ENCOUNTER — Encounter (HOSPITAL_COMMUNITY): Payer: Self-pay

## 2018-01-01 ENCOUNTER — Encounter (HOSPITAL_COMMUNITY): Payer: Self-pay | Admitting: Anesthesiology

## 2018-01-01 ENCOUNTER — Encounter (HOSPITAL_COMMUNITY): Payer: Self-pay | Admitting: Emergency Medicine

## 2018-01-01 ENCOUNTER — Encounter (HOSPITAL_COMMUNITY)
Admission: RE | Admit: 2018-01-01 | Discharge: 2018-01-01 | Disposition: A | Discharge status: Home / Self Care | Payer: 59 | Source: Ambulatory Visit | Attending: Neurosurgery | Admitting: Neurosurgery

## 2018-01-01 ENCOUNTER — Other Ambulatory Visit: Payer: Self-pay

## 2018-01-01 DIAGNOSIS — Z9049 Acquired absence of other specified parts of digestive tract: Secondary | ICD-10-CM | POA: Diagnosis not present

## 2018-01-01 DIAGNOSIS — K219 Gastro-esophageal reflux disease without esophagitis: Secondary | ICD-10-CM | POA: Diagnosis not present

## 2018-01-01 DIAGNOSIS — Z90710 Acquired absence of both cervix and uterus: Secondary | ICD-10-CM | POA: Insufficient documentation

## 2018-01-01 DIAGNOSIS — M4316 Spondylolisthesis, lumbar region: Secondary | ICD-10-CM | POA: Insufficient documentation

## 2018-01-01 DIAGNOSIS — Z7952 Long term (current) use of systemic steroids: Secondary | ICD-10-CM | POA: Diagnosis not present

## 2018-01-01 DIAGNOSIS — F419 Anxiety disorder, unspecified: Secondary | ICD-10-CM | POA: Insufficient documentation

## 2018-01-01 DIAGNOSIS — I10 Essential (primary) hypertension: Secondary | ICD-10-CM | POA: Diagnosis not present

## 2018-01-01 DIAGNOSIS — Z9889 Other specified postprocedural states: Secondary | ICD-10-CM | POA: Insufficient documentation

## 2018-01-01 DIAGNOSIS — K589 Irritable bowel syndrome without diarrhea: Secondary | ICD-10-CM | POA: Diagnosis not present

## 2018-01-01 DIAGNOSIS — F329 Major depressive disorder, single episode, unspecified: Secondary | ICD-10-CM | POA: Diagnosis not present

## 2018-01-01 DIAGNOSIS — Z79899 Other long term (current) drug therapy: Secondary | ICD-10-CM | POA: Insufficient documentation

## 2018-01-01 DIAGNOSIS — Z01812 Encounter for preprocedural laboratory examination: Secondary | ICD-10-CM | POA: Insufficient documentation

## 2018-01-01 HISTORY — DX: Essential (primary) hypertension: I10

## 2018-01-01 HISTORY — DX: Family history of other specified conditions: Z84.89

## 2018-01-01 HISTORY — DX: Adverse effect of unspecified anesthetic, initial encounter: T41.45XA

## 2018-01-01 HISTORY — DX: Other complications of anesthesia, initial encounter: T88.59XA

## 2018-01-01 LAB — CBC WITH DIFFERENTIAL/PLATELET
Abs Immature Granulocytes: 0.1 10*3/uL (ref 0.0–0.1)
Basophils Absolute: 0 10*3/uL (ref 0.0–0.1)
Basophils Relative: 1 %
Eosinophils Absolute: 0 10*3/uL (ref 0.0–0.7)
Eosinophils Relative: 0 %
HCT: 45.9 % (ref 36.0–46.0)
Hemoglobin: 14.6 g/dL (ref 12.0–15.0)
IMMATURE GRANULOCYTES: 1 %
LYMPHS ABS: 1.5 10*3/uL (ref 0.7–4.0)
LYMPHS PCT: 18 %
MCH: 29.4 pg (ref 26.0–34.0)
MCHC: 31.8 g/dL (ref 30.0–36.0)
MCV: 92.4 fL (ref 78.0–100.0)
MONOS PCT: 5 %
Monocytes Absolute: 0.4 10*3/uL (ref 0.1–1.0)
NEUTROS PCT: 75 %
Neutro Abs: 6.5 10*3/uL (ref 1.7–7.7)
PLATELETS: 231 10*3/uL (ref 150–400)
RBC: 4.97 MIL/uL (ref 3.87–5.11)
RDW: 12.4 % (ref 11.5–15.5)
WBC: 8.5 10*3/uL (ref 4.0–10.5)

## 2018-01-01 LAB — ABO/RH: ABO/RH(D): B NEG

## 2018-01-01 LAB — BASIC METABOLIC PANEL
ANION GAP: 12 (ref 5–15)
BUN: 16 mg/dL (ref 6–20)
CALCIUM: 9.4 mg/dL (ref 8.9–10.3)
CO2: 23 mmol/L (ref 22–32)
CREATININE: 0.96 mg/dL (ref 0.44–1.00)
Chloride: 103 mmol/L (ref 98–111)
GFR calc Af Amer: >60 mL/min (ref >60)
GFR calc non Af Amer: >60 mL/min (ref >60)
GLUCOSE: 204 mg/dL — AB (ref 70–99)
Potassium: 4.2 mmol/L (ref 3.5–5.1)
Sodium: 138 mmol/L (ref 135–145)

## 2018-01-01 LAB — TYPE AND SCREEN
ABO/RH(D): B NEG
Antibody Screen: NEGATIVE

## 2018-01-01 LAB — SURGICAL PCR SCREEN
MRSA, PCR: NEGATIVE
Staphylococcus aureus: POSITIVE — AB

## 2018-01-01 MED ORDER — CHLORHEXIDINE GLUCONATE CLOTH 2 % EX PADS: 6.0000 | MEDICATED_PAD | Freq: Once | CUTANEOUS | Status: DC

## 2018-01-01 NOTE — Progress Notes (Signed)
Anesthesia Chart Review:  Case:  [id number] Date/Time:  01/09/18 0745   Procedure:  PLIF - L4-L5 (N/A Back)   Anesthesia type:  General   Pre-op diagnosis:  Spondylolisthesis   Location:  [facility] OR ROOM 18 / [facility] OR   Surgeon:  Julio Sicks, MD      DISCUSSION: 57 yo female never smoker scheduled for above procedure. Pertinent medical hx includes anemia, anxiety, GERD, severe PONV.  Pt previously saw Dr. Chilton Si for HTN and chest pain. Her symptoms were felt to be neurocardiogenic and she was asked to wear compression stockings (also to help with her varicose veins) and increase her fluid intake. Lab testing for pheochromocytoma and carcinoid were negative.  She underwent Lexiscan Cardiolite 04/2015 that was negative for ischemia and echocardiogram 05/2015 revealed grade 1 diastolic dysfunction. Her blood pressure was intermittently elevated. She reported having a lot of anxiety and stress at the time. Symptoms were noted to improve and she was instructed to f/u in 1 year from her 05/08/2017 appt.  I saw pt at PAT appt and discussed her anesthesia history. She reported that with previous surgery she has had significant post operative nausea and vomiting. For this reason she is very anxious about her upcoming procedure. I assured her the anesthesiologists would be made aware of her history and every effort would be made to keep her comfortable. She did report that in 2003 she had bladder surgery and did not experience any PONV.  Anticipate pt can proceed with surgery as scheduled barring any acute status change.  VS: BP (!) 142/77   Pulse 85   Temp 36.8 C (Oral)   Resp 18   Ht 5\' 6"  (1.676 m)   Wt 234 lb 8 oz (106.4 kg)   SpO2 98%   BMI 37.85 kg/m   PROVIDERS: Corwin Levins, MD is PCP last seen 12/24/2017  Chilton Si, MD is Cardiologist last seen 05/08/2017   LABS: Labs reviewed: Acceptable for surgery. (all labs ordered are listed, but only abnormal results are  displayed)  Labs Reviewed  BASIC METABOLIC PANEL - Abnormal; Notable for the following components:      Result Value   Glucose, Bld 204 (*)    All other components within normal limits  SURGICAL PCR SCREEN  CBC WITH DIFFERENTIAL/PLATELET  TYPE AND SCREEN     IMAGES: N/A   EKG:  05/08/2017: NSR. Cannot rule out Anterior infarct, age undetermined. Low voltage limb leads, Poor R wave progression.    CV: Echo 06/07/15: Study Conclusions  - Left ventricle: The cavity size was normal. There was mild focal basal hypertrophy of the septum. Systolic function was vigorous. The estimated ejection fraction was in the range of 65% to 70%. Wall motion was normal; there were no regional wall motion abnormalities. Doppler parameters are consistent with abnormal left ventricular relaxation (grade 1 diastolic dysfunction). There was no evidence of elevated ventricular filling pressure by Doppler parameters. - Aortic valve: Trileaflet; normal thickness leaflets. There was no regurgitation. - Aortic root: The aortic root was normal in size. - Ascending aorta: The ascending aorta was normal in size. - Mitral valve: There was trivial regurgitation. - Left atrium: The atrium was normal in size. - Right ventricle: Systolic function was normal. - Tricuspid valve: There was trivial regurgitation. - Pulmonic valve: There was no regurgitation. - Pulmonary arteries: Systolic pressure was within the normal range. - Inferior vena cava: The vessel was normal in size. - Pericardium, extracardiac: There was no pericardial effusion.  Lexiscan Cardiolite 05/24/15:  Nuclear stress EF: 73%.  The left ventricular ejection fraction is hyperdynamic (>65%).  There was no ST segment deviation noted during stress.  The study is normal.  ABIs 01/30/17: normal    Past Medical History:  Diagnosis Date  . Anemia   . ANEMIA-IRON DEFICIENCY 01/03/2007  . Anxiety   . ANXIETY 01/03/2007   . Complication of anesthesia    severe post-operative nausea except for bladder surgery in 2002 at [facility]-did not have complications then  . Depression   . DEPRESSION 01/03/2007  . DJD (degenerative joint disease)    knee  . Family history of adverse reaction to anesthesia    mother and father both had severe post-operative nausea and vomiting  . GERD 01/16/2009  . GERD (gastroesophageal reflux disease)   . Headache(784.0) 01/03/2007  . Hyperplastic colon polyp    04/2010  . HYPERSOMNIA 04/04/2010  . Hypertension   . IBS 01/16/2009  . IBS (irritable bowel syndrome)   . Impaired glucose tolerance 05/18/2011  . Menopause    early  . Migraine   . Obesity   . 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 Wheezing 07/02/2010    Past Surgical History:  Procedure Laterality Date  . ABDOMINAL HYSTERECTOMY    . APPENDECTOMY    . bladder tac    . CHOLECYSTECTOMY    . KNEE ARTHROSCOPY Bilateral   . OOPHORECTOMY      MEDICATIONS: . acetaminophen (TYLENOL) 500 MG tablet  . calamine lotion  . Cholecalciferol (VITAMIN D3) 2000 units capsule  . Coenzyme Q10 (COQ-10 PO)  . esomeprazole (NEXIUM) 20 MG capsule  . estradiol (ESTRACE) 0.1 MG/GM vaginal cream  . estradiol (ESTRACE) 1 MG tablet  . losartan (COZAAR) 25 MG tablet  . Omega-3 Fatty Acids (OMEGA 3 PO)  . predniSONE (DELTASONE) 10 MG tablet  . Probiotic CAPS  . promethazine (PHENERGAN) 25 MG tablet  . triamcinolone cream (KENALOG) 0.1 %   . Chlorhexidine Gluconate Cloth 2 % PADS 6 each   And  . Chlorhexidine Gluconate Cloth 2 % PADS 6 each     Zannie CoveJames Burns, PA-C [facility]Anesthesiology Phone [phone] 01/01/2018 2:09 PM

## 2018-01-05 ENCOUNTER — Other Ambulatory Visit (INDEPENDENT_AMBULATORY_CARE_PROVIDER_SITE_OTHER): Payer: 59

## 2018-01-05 ENCOUNTER — Ambulatory Visit (INDEPENDENT_AMBULATORY_CARE_PROVIDER_SITE_OTHER): Payer: 59 | Admitting: Internal Medicine

## 2018-01-05 ENCOUNTER — Encounter: Payer: Self-pay | Admitting: Internal Medicine

## 2018-01-05 DIAGNOSIS — B356 Tinea cruris: Secondary | ICD-10-CM

## 2018-01-05 DIAGNOSIS — E1165 Type 2 diabetes mellitus with hyperglycemia: Secondary | ICD-10-CM

## 2018-01-05 DIAGNOSIS — T380X5A Adverse effect of glucocorticoids and synthetic analogues, initial encounter: Secondary | ICD-10-CM

## 2018-01-05 DIAGNOSIS — L259 Unspecified contact dermatitis, unspecified cause: Secondary | ICD-10-CM | POA: Diagnosis not present

## 2018-01-05 DIAGNOSIS — R739 Hyperglycemia, unspecified: Secondary | ICD-10-CM | POA: Diagnosis not present

## 2018-01-05 DIAGNOSIS — E099 Drug or chemical induced diabetes mellitus without complications: Secondary | ICD-10-CM | POA: Diagnosis not present

## 2018-01-05 LAB — HEMOGLOBIN A1C: Hgb A1c MFr Bld: 6.3 % (ref 4.6–6.5)

## 2018-01-05 LAB — TSH: TSH: 0.73 u[IU]/mL (ref 0.35–4.50)

## 2018-01-05 MED ORDER — FLUCONAZOLE 100 MG PO TABS: ORAL_TABLET | ORAL | 1 refills | Status: DC

## 2018-01-05 MED ORDER — KETOCONAZOLE 2 % EX CREA: 1.0000 "application " | TOPICAL_CREAM | Freq: Two times a day (BID) | CUTANEOUS | 1 refills | Status: DC

## 2018-01-05 MED ORDER — METFORMIN HCL ER 500 MG PO TB24: 500.0000 mg | ORAL_TABLET | Freq: Every day | ORAL | 5 refills | Status: DC

## 2018-01-05 NOTE — Assessment & Plan Note (Signed)
Treat DM Ketoconazole cream bid Diflucan if needed PO

## 2018-01-05 NOTE — Patient Instructions (Addendum)
RTC Wed this week -- Dr Jonny RuizJohn or me

## 2018-01-05 NOTE — Assessment & Plan Note (Signed)
Resolved

## 2018-01-05 NOTE — Progress Notes (Signed)
Subjective:  Patient ID: [name], female    DOB: Sep 01, 1960  Age: 57 y.o. MRN: [id number]  CC: No chief complaint on file.   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 acetaminophen (TYLENOL) 500 MG tablet Take 1,000 mg by mouth daily as needed for moderate pain or headache.    . calamine lotion Apply 1 application topically as needed (poison oak).    . Cholecalciferol (VITAMIN D3) 2000 units capsule Take 2,000 Units by mouth daily.    . Coenzyme Q10 (COQ-10 PO) Take 100 mg by mouth daily.    Marland Kitchen esomeprazole (NEXIUM) 20 MG capsule Take 20 mg by mouth daily.     Marland Kitchen estradiol (ESTRACE) 0.1 MG/GM vaginal cream Place 1 Applicatorful vaginally every 3 (three) days.     Marland Kitchen estradiol (ESTRACE) 1 MG tablet Take 1 mg by mouth daily.     Marland Kitchen losartan (COZAAR) 25 MG tablet TAKE 1 TABLET BY MOUTH EVERY DAY 90 tablet 2  . Omega-3 Fatty Acids (OMEGA 3 PO) Take 2 capsules by mouth daily.    . predniSONE (DELTASONE) 10 MG tablet 3 tabs by mouth per day for 3 days,2tabs per day for 3 days,1tab per day for 3 days 18 tablet 0  . Probiotic CAPS Take 1 capsule by mouth daily.    . promethazine (PHENERGAN) 25 MG tablet Take 25 mg by mouth daily as needed for nausea or vomiting.    . triamcinolone cream (KENALOG) 0.1 % Apply 1 application topically daily as needed. 30 g 1   No facility-administered medications prior to visit.     ROS: Review of Systems  Constitutional: Negative for activity change, appetite change, chills, fatigue and unexpected weight change.  HENT: Negative for congestion, mouth sores and sinus pressure.   Eyes: Negative for visual disturbance.  Respiratory: Negative for cough and chest tightness.   Gastrointestinal: Negative for abdominal pain and nausea.  Genitourinary:  Negative for difficulty urinating, frequency and vaginal pain.  Musculoskeletal: Positive for back pain and gait problem.  Skin: Positive for rash. Negative for pallor.  Neurological: Negative for dizziness, tremors, weakness, numbness and headaches.  Psychiatric/Behavioral: Negative for confusion and sleep disturbance.    Objective:  BP 134/76 (BP Location: Left Arm, Patient Position: Sitting, Cuff Size: Large)   Pulse 79   Temp 98.5 F (36.9 C) (Oral)   Ht 5\' 6"  (1.676 m)   Wt 239 lb (108.4 kg)   SpO2 97%   BMI 38.58 kg/m   BP Readings from Last 3 Encounters:  01/05/18 134/76  01/01/18 (!) 142/77  09/25/17 (!) 143/59    Wt Readings from Last 3 Encounters:  01/05/18 239 lb (108.4 kg)  01/01/18 234 lb 8 oz (106.4 kg)  12/24/17 237 lb (107.5 kg)    Physical Exam  Constitutional: She appears well-developed. No distress.  HENT:  Head: Normocephalic.  Right Ear: External ear normal.  Left Ear: External ear normal.  Nose: Nose normal.  Mouth/Throat: Oropharynx is clear and moist.  Eyes: Pupils are equal, round, and reactive to light. Conjunctivae are normal. Right eye exhibits no discharge. Left eye exhibits no discharge.  Neck: Normal range of motion. Neck supple. No JVD present. No tracheal deviation present. No thyromegaly present.  Cardiovascular: Normal  rate, regular rhythm and normal heart sounds.  Pulmonary/Chest: No stridor. No respiratory distress. She has no wheezes.  Abdominal: Soft. Bowel sounds are normal. She exhibits no distension and no mass. There is no tenderness. There is no rebound and no guarding.  Musculoskeletal: She exhibits no edema or tenderness.  Lymphadenopathy:    She has no cervical adenopathy.  Neurological: She displays normal reflexes. No cranial nerve deficit. She exhibits normal muscle tone. Coordination normal.  Skin: No rash noted. No erythema.  Psychiatric: She has a normal mood and affect. Her behavior is normal. Judgment and thought  content normal.  Obese LS - tender Intertrigo B L>R, vesicular rash    Lab Results  Component Value Date   WBC 8.5 01/01/2018   HGB 14.6 01/01/2018   HCT 45.9 01/01/2018   PLT 231 01/01/2018   GLUCOSE 204 (H) 01/01/2018   CHOL 162 02/28/2017   TRIG 310.0 (H) 02/28/2017   HDL 40.00 02/28/2017   LDLDIRECT 75.0 02/28/2017   LDLCALC 96 04/02/2010   ALT 9 02/28/2017   AST 9 02/28/2017   NA 138 01/01/2018   K 4.2 01/01/2018   CL 103 01/01/2018   CREATININE 0.96 01/01/2018   BUN 16 01/01/2018   CO2 23 01/01/2018   TSH 2.08 02/28/2017   HGBA1C 6.3 02/28/2017    No results found.  Assessment & Plan:   There are no diagnoses linked to this encounter.   No orders of the defined types were placed in this encounter.    Follow-up: No follow-ups on file.  Sonda PrimesAlex Kerigan Narvaez, MD

## 2018-01-05 NOTE — Assessment & Plan Note (Signed)
worse on/after steroids. Start Metformin Cut back on carbs Metformin ER if tolerated

## 2018-01-09 ENCOUNTER — Encounter: Payer: Self-pay | Admitting: Internal Medicine

## 2018-01-09 ENCOUNTER — Ambulatory Visit (INDEPENDENT_AMBULATORY_CARE_PROVIDER_SITE_OTHER): Payer: 59 | Admitting: Internal Medicine

## 2018-01-09 VITALS — BP 128/76 | HR 79 | Temp 98.0°F | Ht 66.0 in | Wt 235.0 lb

## 2018-01-09 DIAGNOSIS — B356 Tinea cruris: Secondary | ICD-10-CM

## 2018-01-09 DIAGNOSIS — R739 Hyperglycemia, unspecified: Secondary | ICD-10-CM | POA: Diagnosis not present

## 2018-01-09 DIAGNOSIS — Z01812 Encounter for preprocedural laboratory examination: Secondary | ICD-10-CM | POA: Diagnosis not present

## 2018-01-09 DIAGNOSIS — T380X5A Adverse effect of glucocorticoids and synthetic analogues, initial encounter: Secondary | ICD-10-CM

## 2018-01-09 DIAGNOSIS — E099 Drug or chemical induced diabetes mellitus without complications: Secondary | ICD-10-CM

## 2018-01-09 NOTE — Patient Instructions (Addendum)
OK to stop metformin (you are off steroids)     Hemoglobin A1c Test Some of the sugar (glucose) that circulates in your blood sticks or binds to blood proteins. Hemoglobin (Hb or Hgb) is one type of blood protein that glucose binds to. It also carries oxygen in the red blood cells (RBCs). When glucose binds to Hb, the glucose-coated Hb is called glycated Hb. Once Hb is glycated, it remains that way for the life of the RBC. This is about 120 days. Rather than testing your blood glucose level on one single day, the hemoglobin A1c (HbA1c) test measures the average amount of glycated hemoglobin and, therefore, the average amount of glucose in your blood during the 3-4 months just before the test is done. The HbA1c test is used to monitor long-term control of blood sugar in people who have diabetes mellitus. The HbA1c test can also be used in addition to or in combination with fasting blood glucose level and oral glucose tolerance tests. What do the results mean? It is your responsibility to obtain your test results. Ask the lab or department performing the test when and how you will get your results. Contact your health care provider to discuss any questions you have about your results. Range of Normal Values Ranges for normal values may vary among different labs and hospitals. You should always check with your health care provider after having lab work or other tests done to discuss the meaning of your test results and whether your values are considered within normal limits. The ranges for normal HbA1c test results are as follows:  Adult or child without diabetes: 4-5.9%.  Adult or child with diabetes and good blood glucose control: less than 6.5%.  Several factors can affect HbA1c test results. These may include:  Diseases (hemoglobinopathies) that cause a change in the shape, size, or amount of Hb in your blood.  Longer than normal RBC life span.  Abnormally low levels of certain proteins in  your blood.  Eating foods or taking supplements that are high in vitamin C (ascorbic acid).  Meaning of Results Outside Normal Value Ranges Abnormally high HbA1c values are most commonly an indication of prediabetes mellitus and diabetes mellitus:  An HbA1c result of 5.7-6.4% is considered diagnostic of prediabetes mellitus.  An HbA1c result of 6.5% or higher on two separate occasions is considered diagnostic of diabetes mellitus.  Abnormally low HbA1c values can be caused by several health conditions. These may include:  Pregnancy.  A large amount of blood loss.  Blood transfusions.  Low red blood cell count (anemia). This is caused by premature destruction of red blood cells.  Long-term kidney failure.  Some unusual forms of Hb (Hb variants), such as sickle cell trait.  Discuss your test results with your health care provider. He or she will use the results to make a diagnosis and determine a treatment plan that is right for you. Talk with your health care provider to discuss your results, treatment options, and if necessary, the need for more tests. Talk with your health care provider if you have any questions about your results. This information is not intended to replace advice given to you by your health care provider. Make sure you discuss any questions you have with your health care provider. Document Released: 07/02/2004 Document Revised: 03/06/2016 Document Reviewed: 10/25/2013 Elsevier Interactive Patient Education  2018 [employer]   Dr Jonny RuizJohn in 3 months

## 2018-01-09 NOTE — Assessment & Plan Note (Signed)
Better  

## 2018-01-09 NOTE — Progress Notes (Signed)
Subjective:  Patient ID: [name], female    DOB: [birth year]  Age: 57 y.o. MRN: [id number]  CC: No chief complaint on file.   HPI [name] presents for hyperglycemia - aggravated by steroids. A1c was ok  Outpatient Medications Prior to Visit  Medication Sig Dispense Refill  . acetaminophen (TYLENOL) 500 MG tablet Take 1,000 mg by mouth daily as needed for moderate pain or headache.    . calamine lotion Apply 1 application topically as needed (poison oak).    . Cholecalciferol (VITAMIN D3) 2000 units capsule Take 2,000 Units by mouth daily.    . Coenzyme Q10 (COQ-10 PO) Take 100 mg by mouth daily.    Marland Kitchen esomeprazole (NEXIUM) 20 MG capsule Take 20 mg by mouth daily.     Marland Kitchen estradiol (ESTRACE) 0.1 MG/GM vaginal cream Place 1 Applicatorful vaginally every 3 (three) days.     Marland Kitchen estradiol (ESTRACE) 1 MG tablet Take 1 mg by mouth daily.     . fluconazole (DIFLUCAN) 100 MG tablet Take 2 tabs on day#1, then 1 tab daily on Days #2-10 11 tablet 1  . ketoconazole (NIZORAL) 2 % cream Apply 1 application topically 2 (two) times daily. 45 g 1  . losartan (COZAAR) 25 MG tablet TAKE 1 TABLET BY MOUTH EVERY DAY 90 tablet 2  . metFORMIN (GLUCOPHAGE XR) 500 MG 24 hr tablet Take 1 tablet (500 mg total) by mouth daily with breakfast. 30 tablet 5  . Omega-3 Fatty Acids (OMEGA 3 PO) Take 2 capsules by mouth daily.    . Probiotic CAPS Take 1 capsule by mouth daily.    . promethazine (PHENERGAN) 25 MG tablet Take 25 mg by mouth daily as needed for nausea or vomiting.    . triamcinolone cream (KENALOG) 0.1 % Apply 1 application topically daily as needed. 30 g 1   No facility-administered medications prior to visit.     ROS: Review of Systems  Constitutional: Negative for activity change, appetite change, chills, fatigue and unexpected weight change.  HENT: Negative for congestion, mouth sores and sinus pressure.   Eyes: Negative for visual disturbance.  Respiratory: Negative for cough and  chest tightness.   Gastrointestinal: Negative for abdominal pain and nausea.  Genitourinary: Negative for difficulty urinating, frequency and vaginal pain.  Musculoskeletal: Negative for back pain and gait problem.  Skin: Negative for pallor and rash.  Neurological: Negative for dizziness, tremors, weakness, numbness and headaches.  Psychiatric/Behavioral: Negative for confusion and sleep disturbance.    Objective:  BP 128/76 (BP Location: Left Arm, Patient Position: Sitting, Cuff Size: Large)   Pulse 79   Temp 98 F (36.7 C) (Oral)   Ht 5\' 6"  (1.676 m)   Wt 235 lb (106.6 kg)   SpO2 98%   BMI 37.93 kg/m   BP Readings from Last 3 Encounters:  01/09/18 128/76  01/05/18 134/76  01/01/18 (!) 142/77    Wt Readings from Last 3 Encounters:  01/09/18 235 lb (106.6 kg)  01/05/18 239 lb (108.4 kg)  01/01/18 234 lb 8 oz (106.4 kg)    Physical Exam  Constitutional: She appears well-developed. No distress.  HENT:  Head: Normocephalic.  Right Ear: External ear normal.  Left Ear: External ear normal.  Nose: Nose normal.  Mouth/Throat: Oropharynx is clear and moist.  Eyes: Pupils are equal, round, and reactive to light. Conjunctivae are normal. Right eye exhibits no discharge. Left eye exhibits no discharge.  Neck: Normal range of motion. Neck supple. No JVD present.  No tracheal deviation present. No thyromegaly present.  Cardiovascular: Normal rate, regular rhythm and normal heart sounds.  Pulmonary/Chest: No stridor. No respiratory distress. She has no wheezes.  Abdominal: Soft. Bowel sounds are normal. She exhibits no distension and no mass. There is no tenderness. There is no rebound and no guarding.  Musculoskeletal: She exhibits no edema or tenderness.  Lymphadenopathy:    She has no cervical adenopathy.  Neurological: She displays normal reflexes. No cranial nerve deficit. She exhibits normal muscle tone. Coordination normal.  Skin: No rash noted. No erythema.  Psychiatric:  She has a normal mood and affect. Her behavior is normal. Judgment and thought content normal.    Lab Results  Component Value Date   WBC 8.5 01/01/2018   HGB 14.6 01/01/2018   HCT 45.9 01/01/2018   PLT 231 01/01/2018   GLUCOSE 204 (H) 01/01/2018   CHOL 162 02/28/2017   TRIG 310.0 (H) 02/28/2017   HDL 40.00 02/28/2017   LDLDIRECT 75.0 02/28/2017   LDLCALC 96 04/02/2010   ALT 9 02/28/2017   AST 9 02/28/2017   NA 138 01/01/2018   K 4.2 01/01/2018   CL 103 01/01/2018   CREATININE 0.96 01/01/2018   BUN 16 01/01/2018   CO2 23 01/01/2018   TSH 0.73 01/05/2018   HGBA1C 6.3 01/05/2018    No results found.  Assessment & Plan:   There are no diagnoses linked to this encounter.   No orders of the defined types were placed in this encounter.    Follow-up: No follow-ups on file.  Sonda PrimesAlex Plotnikov, MD

## 2018-01-09 NOTE — Assessment & Plan Note (Signed)
A1c was ok OK to stop metformin (off steroids)

## 2018-01-21 DIAGNOSIS — M4310 Spondylolisthesis, site unspecified: Secondary | ICD-10-CM | POA: Diagnosis not present

## 2018-02-05 ENCOUNTER — Other Ambulatory Visit: Payer: Self-pay

## 2018-02-05 ENCOUNTER — Encounter (HOSPITAL_COMMUNITY): Payer: Self-pay | Admitting: *Deleted

## 2018-02-05 NOTE — Progress Notes (Signed)
Spoke with pt for pre-op call. Pt had a PAT appt in July and surgery had to be rescheduled because pt had a bad case of poison ivy. Pt's med list had to be updated, but she states nothing has changed with her medical and surgical history. Pt denies cardiac history, chest pain or sob. Pt is not diabetic.

## 2018-02-06 ENCOUNTER — Other Ambulatory Visit: Payer: Self-pay | Admitting: Neurosurgery

## 2018-02-08 MED ORDER — VANCOMYCIN HCL 10 G IV SOLR
1500.0000 mg | INTRAVENOUS | Status: AC
  Filled 2018-02-08: qty 1500

## 2018-02-08 NOTE — Anesthesia Preprocedure Evaluation (Deleted)
Anesthesia Evaluation    Reviewed: Allergy & Precautions, NPO status , Patient's Chart, lab work & pertinent test results  Airway        Dental   Pulmonary           Cardiovascular hypertension,      Neuro/Psych    GI/Hepatic GERD  ,  Endo/Other  diabetes  Renal/GU      Musculoskeletal  (+) Arthritis ,   Abdominal   Peds  Hematology  (+) anemia ,   Anesthesia Other Findings   Reproductive/Obstetrics                             Anesthesia Physical Anesthesia Plan  ASA: III  Anesthesia Plan: General   Post-op Pain Management:    Induction: Intravenous  PONV Risk Score and Plan:   Airway Management Planned: Oral ETT  Additional Equipment:   Intra-op Plan:   Post-operative Plan: Possible Post-op intubation/ventilation  Informed Consent:   Plan Discussed with: Anesthesiologist  Anesthesia Plan Comments:         Anesthesia Quick Evaluation

## 2018-02-09 ENCOUNTER — Inpatient Hospital Stay (HOSPITAL_COMMUNITY): Admission: RE | Admit: 2018-02-09 | Payer: 59 | Source: Ambulatory Visit | Admitting: Neurosurgery

## 2018-02-09 ENCOUNTER — Ambulatory Visit (INDEPENDENT_AMBULATORY_CARE_PROVIDER_SITE_OTHER): Payer: 59 | Admitting: Internal Medicine

## 2018-02-09 ENCOUNTER — Encounter: Payer: Self-pay | Admitting: Internal Medicine

## 2018-02-09 VITALS — HR 67 | Temp 98.1°F | Ht 66.0 in | Wt 229.0 lb

## 2018-02-09 DIAGNOSIS — B029 Zoster without complications: Secondary | ICD-10-CM

## 2018-02-09 DIAGNOSIS — I10 Essential (primary) hypertension: Secondary | ICD-10-CM | POA: Diagnosis not present

## 2018-02-09 DIAGNOSIS — R739 Hyperglycemia, unspecified: Secondary | ICD-10-CM | POA: Diagnosis not present

## 2018-02-09 SURGERY — POSTERIOR LUMBAR FUSION 1 LEVEL
Anesthesia: General | Site: Back

## 2018-02-09 MED ORDER — GABAPENTIN 100 MG PO CAPS: 100.0000 mg | ORAL_CAPSULE | Freq: Three times a day (TID) | ORAL | 3 refills | Status: DC

## 2018-02-09 MED ORDER — VALACYCLOVIR HCL 1 G PO TABS: 1000.0000 mg | ORAL_TABLET | Freq: Three times a day (TID) | ORAL | 0 refills | Status: DC

## 2018-02-09 NOTE — Assessment & Plan Note (Signed)
Acute onset, for pain control, valtrex asd, and avoid susceptible contacts

## 2018-02-09 NOTE — Assessment & Plan Note (Signed)
Lab Results  Component Value Date   HGBA1C 6.3 01/05/2018  stable overall by history and exam, recent data reviewed with pt, and pt to continue medical treatment as before,  to f/u any worsening symptoms or concerns

## 2018-02-09 NOTE — Patient Instructions (Signed)
You appear to have left facial shingles outbreak  Please take all new medication as prescribed - the valtrex (antibiotic), and gabapentin (for nerve pain)  Remember you are infectious to others until 1 wk or all crusted over  Please continue all other medications as before, and refills have been done if requested.  Please have the pharmacy call with any other refills you may need.  Please keep your appointments with your specialists as you may have planned

## 2018-02-09 NOTE — Assessment & Plan Note (Signed)
stable overall by history and exam, recent data reviewed with pt, and pt to continue medical treatment as before,  to f/u any worsening symptoms or concerns BP Readings from Last 3 Encounters:  01/09/18 128/76  01/05/18 134/76  01/01/18 (!) 142/77

## 2018-02-09 NOTE — Progress Notes (Signed)
Subjective:    Patient ID: Stacey BombardSylvia B Lansdale, female    DOB: 02/20/1961, 57 y.o.   MRN: [id number]  HPI  Here with acute onset left facial rash with pain, red, swelling groups of bumps x 1 day, some tender to touch but no fever, contact allergen or sick contact.  No prior hx or shingles. No vision change or conjunctival erythema.  Pt denies chest pain, increased sob or doe, wheezing, orthopnea, PND, increased LE swelling, palpitations, dizziness or syncope.  Pt denies new neurological symptoms such as new headache, or facial or extremity weakness or numbness   Pt denies polydipsia, polyuria, Had to cancel scheduled lumbar surgury this am.   Past Medical History:  Diagnosis Date  . Anemia   . ANEMIA-IRON DEFICIENCY 01/03/2007  . Anxiety   . ANXIETY 01/03/2007  . Complication of anesthesia    severe post-operative nausea except for bladder surgery in 2002 at [facility]-did not have complications then  . Depression   . DEPRESSION 01/03/2007  . DJD (degenerative joint disease)    knee  . Family history of adverse reaction to anesthesia    mother and father both had severe post-operative nausea and vomiting  . GERD 01/16/2009  . GERD (gastroesophageal reflux disease)   . Headache(784.0) 01/03/2007  . Hyperplastic colon polyp    04/2010  . HYPERSOMNIA 04/04/2010  . Hypertension   . IBS 01/16/2009  . IBS (irritable bowel syndrome)   . Impaired glucose tolerance 05/18/2011  . Menopause    early  . Migraine   . Obesity   . 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 Wheezing 07/02/2010   Past Surgical History:  Procedure Laterality Date  . ABDOMINAL HYSTERECTOMY    . APPENDECTOMY    . bladder tac    . CHOLECYSTECTOMY    . KNEE ARTHROSCOPY Bilateral   . OOPHORECTOMY      reports that she has never smoked. She has never used smokeless tobacco. She reports that she does not drink alcohol or use drugs. family history includes Diabetes in her brother; Hypertension in her mother. Allergies   Allergen Reactions  . Meperidine Shortness Of Breath  . Morphine Anaphylaxis  . Penicillins Hives, Nausea Only, Swelling and Other (See Comments)    PATIENT HAS HAD A PCN REACTION WITH IMMEDIATE RASH, FACIAL/TONGUE/THROAT SWELLING, SOB, OR LIGHTHEADEDNESS WITH HYPOTENSION:  #  #  YES  #  #  HAS PT DEVELOPED SEVERE RASH INVOLVING MUCUS MEMBRANES or SKIN NECROSIS: #  #  YES  #  #  PATIENT HAS HAD A PCN REACTION THAT REQUIRED HOSPITALIZATION:  #  #  YES  #  #  Has patient had a PCN reaction occurring within the last 10 years: No   . Lisinopril Cough       . Amoxicillin Nausea Only  . Azithromycin Nausea And Vomiting  . Codeine Nausea Only and Other (See Comments)    Hot flashes also  . Hydrocodone Itching, Nausea Only and Rash  . Omeprazole Itching and Rash  . Oxycodone-Acetaminophen Nausea And Vomiting and Rash  . Sucralfate Rash and Other (See Comments)    Blurry vision   . Tramadol Itching, Nausea Only and Rash    Current Outpatient Medications on File Prior to Visit  Medication Sig Dispense Refill  . acetaminophen (TYLENOL) 500 MG tablet Take 1,000 mg by mouth daily as needed for moderate pain or headache.    . calamine lotion Apply 1 application topically as needed (poison oak).    .Marland Kitchen  Cholecalciferol (VITAMIN D3) 2000 units capsule Take 2,000 Units by mouth daily.    . Coenzyme Q10 (COQ-10 PO) Take 100 mg by mouth daily.    Marland Kitchen. esomeprazole (NEXIUM) 20 MG capsule Take 20 mg by mouth daily.     Marland Kitchen. estradiol (ESTRACE) 0.1 MG/GM vaginal cream Place 1 Applicatorful vaginally every 3 (three) days.     Marland Kitchen. estradiol (ESTRACE) 1 MG tablet Take 1 mg by mouth daily.     . fluconazole (DIFLUCAN) 100 MG tablet Take 2 tabs on day#1, then 1 tab daily on Days #2-10 11 tablet 1  . ketoconazole (NIZORAL) 2 % cream Apply 1 application topically 2 (two) times daily. 45 g 1  . losartan (COZAAR) 25 MG tablet TAKE 1 TABLET BY MOUTH EVERY DAY 90 tablet 2  . metFORMIN (GLUCOPHAGE XR) 500 MG 24 hr tablet  Take 1 tablet (500 mg total) by mouth daily with breakfast. 30 tablet 5  . Omega-3 Fatty Acids (OMEGA 3 PO) Take 2 capsules by mouth daily.    . Probiotic CAPS Take 1 capsule by mouth daily.    . promethazine (PHENERGAN) 25 MG tablet Take 25 mg by mouth daily as needed for nausea or vomiting.    . triamcinolone cream (KENALOG) 0.1 % Apply 1 application topically daily as needed. 30 g 1   Current Facility-Administered Medications on File Prior to Visit  Medication Dose Route Frequency Provider Last Rate Last Dose  . vancomycin (VANCOCIN) 1,500 mg in sodium chloride 0.9 % 500 mL IVPB  1,500 mg Intravenous To SS-Surg Pool, Sherilyn CooterHenry, MD       Review of Systems  Constitutional: Negative for other unusual diaphoresis or sweats HENT: Negative for ear discharge or swelling Eyes: Negative for other worsening visual disturbances Respiratory: Negative for stridor or other swelling  Gastrointestinal: Negative for worsening distension or other blood Genitourinary: Negative for retention or other urinary change Musculoskeletal: Negative for other MSK pain or swelling Skin: Negative for color change or other new lesions Neurological: Negative for worsening tremors and other numbness  Psychiatric/Behavioral: Negative for worsening agitation or other fatigue\All other system neg per pt    Objective:   Physical Exam Pulse 67   Temp 98.1 F (36.7 C) (Oral)   Ht 5\' 6"  (1.676 m)   Wt 229 lb (103.9 kg)   SpO2 97%   BMI 36.96 kg/m  VS noted,  Constitutional: Pt appears in NAD HENT: Head: NCAT.  Right Ear: External ear normal.  Left Ear: External ear normal.  Eyes: . Pupils are equal, round, and reactive to light. Conjunctivae and EOM are normal Nose: without d/c or deformity Neck: Neck supple. Gross normal ROM Cardiovascular: Normal rate and regular rhythm.   Pulmonary/Chest: Effort normal and breath sounds without rales or wheezing.  Neurological: Pt is alert. At baseline orientation, motor grossly  intact Skin: Skin is warm, no LE edema, left facial erythema, swelling, tender and grouped vesicles on erythem base to periorbital, mid facial and lower facial/submandibular  Psychiatric: Pt behavior is normal without agitation  No other exam findings    Assessment & Plan:

## 2018-02-16 ENCOUNTER — Encounter (HOSPITAL_COMMUNITY): Admission: RE | Payer: Self-pay | Source: Ambulatory Visit

## 2018-02-16 ENCOUNTER — Inpatient Hospital Stay (HOSPITAL_COMMUNITY): Admission: RE | Admit: 2018-02-16 | Payer: 59 | Source: Ambulatory Visit | Admitting: Neurosurgery

## 2018-02-16 SURGERY — POSTERIOR LUMBAR FUSION 1 LEVEL
Anesthesia: General | Site: Back

## 2018-02-18 ENCOUNTER — Ambulatory Visit (INDEPENDENT_AMBULATORY_CARE_PROVIDER_SITE_OTHER): Payer: 59 | Admitting: Internal Medicine

## 2018-02-18 ENCOUNTER — Encounter: Payer: Self-pay | Admitting: Internal Medicine

## 2018-02-18 VITALS — BP 132/88 | HR 67 | Temp 98.2°F | Ht 66.0 in | Wt 235.0 lb

## 2018-02-18 DIAGNOSIS — B029 Zoster without complications: Secondary | ICD-10-CM

## 2018-02-18 DIAGNOSIS — R739 Hyperglycemia, unspecified: Secondary | ICD-10-CM | POA: Diagnosis not present

## 2018-02-18 DIAGNOSIS — Z0000 Encounter for general adult medical examination without abnormal findings: Secondary | ICD-10-CM

## 2018-02-18 DIAGNOSIS — I10 Essential (primary) hypertension: Secondary | ICD-10-CM

## 2018-02-18 DIAGNOSIS — Z01818 Encounter for other preprocedural examination: Secondary | ICD-10-CM | POA: Diagnosis not present

## 2018-02-18 NOTE — Patient Instructions (Addendum)
You are hereby cleared for surgury as planned  Please continue all other medications as before, and refills have been done if requested.  Please have the pharmacy call with any other refills you may need.  Please keep your appointments with your specialists as you may have planned  Please return in 3 months, or sooner if needed, with Lab testing done 3-5 days before

## 2018-02-18 NOTE — Assessment & Plan Note (Signed)
stable overall by history and exam, recent data reviewed with pt, and pt to continue medical treatment as before,  to f/u any worsening symptoms or concerns  

## 2018-02-18 NOTE — Progress Notes (Signed)
Subjective:    Patient ID: [name], female    DOB: 1961/06/16, 57 y.o.   MRN: [id number]  HPI  Here to f/u left facial shingles now resolved crustiness, tolerated tx well.  No fever or vision or eye involvement.  Pt denies chest pain, increased sob or doe, wheezing, orthopnea, PND, increased LE swelling, palpitations, dizziness or syncope.  Pt denies new neurological symptoms such as new headache, or facial or extremity weakness or numbness   Pt denies polydipsia, polyuria.  Eager for NS to be rescheduled for third time, wants to make sure she is ok to do this.   Past Medical History:  Diagnosis Date  . Anemia   . ANEMIA-IRON DEFICIENCY 01/03/2007  . Anxiety   . ANXIETY 01/03/2007  . Complication of anesthesia    severe post-operative nausea except for bladder surgery in 2002 at [facility]-did not have complications then  . Depression   . DEPRESSION 01/03/2007  . DJD (degenerative joint disease)    knee  . Family history of adverse reaction to anesthesia    mother and father both had severe post-operative nausea and vomiting  . GERD 01/16/2009  . GERD (gastroesophageal reflux disease)   . Headache(784.0) 01/03/2007  . Hyperplastic colon polyp    04/2010  . HYPERSOMNIA 04/04/2010  . Hypertension   . IBS 01/16/2009  . IBS (irritable bowel syndrome)   . Impaired glucose tolerance 05/18/2011  . Menopause    early  . Migraine   . Obesity   . 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 Wheezing 07/02/2010   Past Surgical History:  Procedure Laterality Date  . ABDOMINAL HYSTERECTOMY    . APPENDECTOMY    . bladder tac    . CHOLECYSTECTOMY    . KNEE ARTHROSCOPY Bilateral   . OOPHORECTOMY      reports that she has never smoked. She has never used smokeless tobacco. She reports that she does not drink alcohol or use drugs. family history includes Diabetes in her brother; Hypertension in her mother. Allergies  Allergen Reactions  . Meperidine Shortness Of Breath  . Morphine  Anaphylaxis  . Penicillins Hives, Nausea Only, Swelling and Other (See Comments)    PATIENT HAS HAD A PCN REACTION WITH IMMEDIATE RASH, FACIAL/TONGUE/THROAT SWELLING, SOB, OR LIGHTHEADEDNESS WITH HYPOTENSION:  #  #  YES  #  #  HAS PT DEVELOPED SEVERE RASH INVOLVING MUCUS MEMBRANES or SKIN NECROSIS: #  #  YES  #  #  PATIENT HAS HAD A PCN REACTION THAT REQUIRED HOSPITALIZATION:  #  #  YES  #  #  Has patient had a PCN reaction occurring within the last 10 years: No   . Lisinopril Cough       . Amoxicillin Nausea Only  . Azithromycin Nausea And Vomiting  . Codeine Nausea Only and Other (See Comments)    Hot flashes also  . Hydrocodone Itching, Nausea Only and Rash  . Omeprazole Itching and Rash  . Oxycodone-Acetaminophen Nausea And Vomiting and Rash  . Sucralfate Rash and Other (See Comments)    Blurry vision   . Tramadol Itching, Nausea Only and Rash   Current Outpatient Medications on File Prior to Visit  Medication Sig Dispense Refill  . acetaminophen (TYLENOL) 500 MG tablet Take 1,000 mg by mouth daily as needed for moderate pain or headache.    . calamine lotion Apply 1 application topically as needed (poison oak).    . Cholecalciferol (VITAMIN D3) 2000 units capsule Take 2,000  Units by mouth daily.    . Coenzyme Q10 (COQ-10 PO) Take 100 mg by mouth daily.    Marland Kitchen. esomeprazole (NEXIUM) 20 MG capsule Take 20 mg by mouth daily.     Marland Kitchen. estradiol (ESTRACE) 0.1 MG/GM vaginal cream Place 1 Applicatorful vaginally every 3 (three) days.     Marland Kitchen. estradiol (ESTRACE) 1 MG tablet Take 1 mg by mouth daily.     . fluconazole (DIFLUCAN) 100 MG tablet Take 2 tabs on day#1, then 1 tab daily on Days #2-10 11 tablet 1  . gabapentin (NEURONTIN) 100 MG capsule Take 1 capsule (100 mg total) by mouth 3 (three) times daily. 90 capsule 3  . ketoconazole (NIZORAL) 2 % cream Apply 1 application topically 2 (two) times daily. 45 g 1  . losartan (COZAAR) 25 MG tablet TAKE 1 TABLET BY MOUTH EVERY DAY 90 tablet 2  .  metFORMIN (GLUCOPHAGE XR) 500 MG 24 hr tablet Take 1 tablet (500 mg total) by mouth daily with breakfast. 30 tablet 5  . Omega-3 Fatty Acids (OMEGA 3 PO) Take 2 capsules by mouth daily.    . Probiotic CAPS Take 1 capsule by mouth daily.    . promethazine (PHENERGAN) 25 MG tablet Take 25 mg by mouth daily as needed for nausea or vomiting.    . triamcinolone cream (KENALOG) 0.1 % Apply 1 application topically daily as needed. 30 g 1  . valACYclovir (VALTREX) 1000 MG tablet Take 1 tablet (1,000 mg total) by mouth 3 (three) times daily. 21 tablet 0   No current facility-administered medications on file prior to visit.    Review of Systems  Constitutional: Negative for other unusual diaphoresis or sweats HENT: Negative for ear discharge or swelling Eyes: Negative for other worsening visual disturbances Respiratory: Negative for stridor or other swelling  Gastrointestinal: Negative for worsening distension or other blood Genitourinary: Negative for retention or other urinary change Musculoskeletal: Negative for other MSK pain or swelling Skin: Negative for color change or other new lesions Neurological: Negative for worsening tremors and other numbness  Psychiatric/Behavioral: Negative for worsening agitation or other fatigue All other system neg per pt    Objective:   Physical Exam BP 132/88   Pulse 67   Temp 98.2 F (36.8 C) (Oral)   Ht 5\' 6"  (1.676 m)   Wt 235 lb (106.6 kg)   SpO2 96%   BMI 37.93 kg/m  VS noted,  Constitutional: Pt appears in NAD HENT: Head: NCAT.  Right Ear: External ear normal.  Left Ear: External ear normal.  Eyes: . Pupils are equal, round, and reactive to light. Conjunctivae and EOM are normal Nose: without d/c or deformity Neck: Neck supple. Gross normal ROM Cardiovascular: Normal rate and regular rhythm.   Pulmonary/Chest: Effort normal and breath sounds without rales or wheezing.  Abd:  Soft, NT, ND, + BS, no organomegaly Neurological: Pt is alert. At  baseline orientation, motor grossly intact Skin: Skin is warm. No rashes, other new lesions, no LE edema Psychiatric: Pt behavior is normal without agitation  No other exam findings Lab Results  Component Value Date   WBC 8.5 01/01/2018   HGB 14.6 01/01/2018   HCT 45.9 01/01/2018   PLT 231 01/01/2018   GLUCOSE 204 (H) 01/01/2018   CHOL 162 02/28/2017   TRIG 310.0 (H) 02/28/2017   HDL 40.00 02/28/2017   LDLDIRECT 75.0 02/28/2017   LDLCALC 96 04/02/2010   ALT 9 02/28/2017   AST 9 02/28/2017   NA 138 01/01/2018  K 4.2 01/01/2018   CL 103 01/01/2018   CREATININE 0.96 01/01/2018   BUN 16 01/01/2018   CO2 23 01/01/2018   TSH 0.73 01/05/2018   HGBA1C 6.3 01/05/2018       Assessment & Plan:

## 2018-02-18 NOTE — Assessment & Plan Note (Addendum)
stable overall by history and exam, recent data reviewed with pt, and pt to continue medical treatment as before,  to f/u any worsening symptoms or concerns  Note:  Total time for pt hx, exam, review of record with pt in the room, determination of diagnoses and plan for further eval and tx is > 40 min, with over 50% spent in coordination and counseling of patient including the differential dx, tx, further evaluation and other management of hyperglycemia, shingles, HTN and preop internal medicine

## 2018-02-18 NOTE — Assessment & Plan Note (Signed)
Clinically resolved, no further tx needed

## 2018-02-18 NOTE — Assessment & Plan Note (Signed)
Cleared for surgury as planned, no further eval or tx needed

## 2018-02-19 ENCOUNTER — Other Ambulatory Visit: Payer: Self-pay | Admitting: Neurosurgery

## 2018-02-24 ENCOUNTER — Ambulatory Visit (INDEPENDENT_AMBULATORY_CARE_PROVIDER_SITE_OTHER): Payer: 59 | Admitting: Cardiovascular Disease

## 2018-02-24 ENCOUNTER — Encounter: Payer: Self-pay | Admitting: Cardiovascular Disease

## 2018-02-24 VITALS — BP 133/84 | HR 67 | Ht 65.0 in | Wt 234.2 lb

## 2018-02-24 DIAGNOSIS — Z5181 Encounter for therapeutic drug level monitoring: Secondary | ICD-10-CM | POA: Diagnosis not present

## 2018-02-24 DIAGNOSIS — I10 Essential (primary) hypertension: Secondary | ICD-10-CM | POA: Diagnosis not present

## 2018-02-24 NOTE — Patient Instructions (Signed)
Medication Instructions:  Your physician recommends that you continue on your current medications as directed. Please refer to the Current Medication list given to you today.  Labwork: FASTING LP/CMET SOON   Testing/Procedures: NONE  Follow-Up: Your physician wants you to follow-up in: 1 YEAR  You will receive a reminder letter in the mail two months in advance. If you don't receive a letter, please call our office to schedule the follow-up appointment.  If you need a refill on your cardiac medications before your next appointment, please call your pharmacy.

## 2018-02-24 NOTE — Progress Notes (Signed)
Cardiology Office Note   Date:  02/24/2018   ID:  [name], DOB 02/06/1961, MRN [id number]  PCP:  Corwin Levins, MD  Cardiologist:   Chilton Si, MD   No chief complaint on file.   History of Present Illness: [name] is a 57 y.o. female with hypertension, gastroesophageal reflux disease and depression who presents for follow up.  [name] was referred 04/2015 for a complaint of dizziness and dyspnea on exertion.  EKG showed sinus rhythm with low voltage and poor R wave progression.  BNP was 17.  Stacey episodes occurred with exertion and were associated with a flushed feeling.  Her symptoms were felt to be neurocardiogenic and she was asked to wear compression stockings (also to help with her varicose veins) and increase her fluid intake.  Lab testing for pheochromocytoma and carcinoid were negative.  She underwent Lexiscan Cardiolite 04/2015 that was negative for ischemia and echocardiogram 05/2015 revealed grade 1 diastolic dysfunction.  Her blood pressure was intermittently elevated. She reported having a lot of anxiety and stress at Stacey time. She was instructed to follow-up with her PCP or psychiatrist to discuss her anxiety.  Her blood pressure remained elevated so she was started on lisinopril.  This was switched to losartan due to cough.  At her last appointment she reported lower extremity numbness and claudication.  She was referred for ABIs 01/30/17 that were normal bilaterally.  [name] has been doing well from a cardiac standpoint.  She was supposed to have surgery on her back 12/2017.  However this was postponed because she had a bad case of poison ivy.  She then developed a yeast infection and elevated glucose levels in Stacey setting of prednisone use.  Her surgery was then delayed again 01/2018 because of an episode of shingles.  She is finally recovered and is supposed to have her surgery later this month.  She continues to have constant numbness in her feet.   This limits her ability to exercise.  She has no chest pain and her breathing has been stable.  She has no edema, orthopnea or PND.     Past Medical History:  Diagnosis Date  . Anemia   . ANEMIA-IRON DEFICIENCY 01/03/2007  . Anxiety   . ANXIETY 01/03/2007  . Complication of anesthesia    severe post-operative nausea except for bladder surgery in 2002 at [facility]-did not have complications then  . Depression   . DEPRESSION 01/03/2007  . DJD (degenerative joint disease)    knee  . Family history of adverse reaction to anesthesia    mother and father both had severe post-operative nausea and vomiting  . GERD 01/16/2009  . GERD (gastroesophageal reflux disease)   . Headache(784.0) 01/03/2007  . Hyperplastic colon polyp    04/2010  . HYPERSOMNIA 04/04/2010  . Hypertension   . IBS 01/16/2009  . IBS (irritable bowel syndrome)   . Impaired glucose tolerance 05/18/2011  . Menopause    early  . Migraine   . Obesity   . 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 Wheezing 07/02/2010    Past Surgical History:  Procedure Laterality Date  . ABDOMINAL HYSTERECTOMY    . APPENDECTOMY    . bladder tac    . CHOLECYSTECTOMY    . KNEE ARTHROSCOPY Bilateral   . OOPHORECTOMY       Current Outpatient Medications  Medication Sig Dispense Refill  . acetaminophen (TYLENOL) 500 MG tablet Take 1,000 mg by mouth daily as  needed for moderate pain or headache.    . calamine lotion Apply 1 application topically as needed (poison oak).    . Coenzyme Q10 (COQ-10 PO) Take 100 mg by mouth daily.    Marland Kitchen esomeprazole (NEXIUM) 20 MG capsule Take 20 mg by mouth daily.     Marland Kitchen estradiol (ESTRACE) 0.1 MG/GM vaginal cream Place 1 Applicatorful vaginally every 3 (three) days.     Marland Kitchen estradiol (ESTRACE) 1 MG tablet Take 1 mg by mouth daily.     Marland Kitchen losartan (COZAAR) 25 MG tablet TAKE 1 TABLET BY MOUTH EVERY DAY 90 tablet 2  . Omega-3 Fatty Acids (OMEGA 3 PO) Take 2 capsules by mouth daily.    . Probiotic CAPS Take 1  capsule by mouth daily.    . promethazine (PHENERGAN) 25 MG tablet Take 25 mg by mouth daily as needed for nausea or vomiting.    . triamcinolone cream (KENALOG) 0.1 % Apply 1 application topically daily as needed. 30 g 1   No current facility-administered medications for this visit.     Allergies:   Meperidine; Morphine; Penicillins; Lisinopril; Amoxicillin; Azithromycin; Codeine; Hydrocodone; Omeprazole; Oxycodone-acetaminophen; Sucralfate; and Tramadol    Social History:  [name]  reports that she has never smoked. She has never used smokeless tobacco. She reports that she does not drink alcohol or use drugs.   Family History:  [name]'s family history includes Diabetes in her brother; Hypertension in her mother.    ROS:  Please see Stacey history of present illness.   Otherwise, review of systems are positive for none.   All other systems are reviewed and negative.    PHYSICAL EXAM: VS:  BP 133/84   Pulse 67   Ht 5\' 5"  (1.651 m)   Wt 234 lb 3.2 oz (106.2 kg)   BMI 38.97 kg/m  , BMI Body mass index is 38.97 kg/m. GENERAL:  Well appearing HEENT: Pupils equal round and reactive, fundi not visualized, oral mucosa unremarkable NECK:  No jugular venous distention, waveform within normal limits, carotid upstroke brisk and symmetric, no bruits, no thyromegaly LUNGS:  Clear to auscultation bilaterally HEART:  RRR.  PMI not displaced or sustained,S1 and S2 within normal limits, no S3, no S4, no clicks, no rubs, no murmurs ABD:  Flat, positive bowel sounds normal in frequency in pitch, no bruits, no rebound, no guarding, no midline pulsatile mass, no hepatomegaly, no splenomegaly EXT:  2 plus radial pulses.  1+ DP/PT bilaterally.  No edema, no cyanosis no clubbing SKIN:  No rashes no nodules NEURO:  Cranial nerves II through XII grossly intact, motor grossly intact throughout PSYCH:  Cognitively intact, oriented to person place and time   EKG:  EKG is ordered today. 01/12/16:  Sinus bradycardia.  Rate 56 bpm.  08/02/16: Sinus rhythm.  Rate 78 bpm.   05/08/17: Sinus rhythm.  Rate 76 bpm.  Poor R wave progression.  Low voltage limb leads. 02/24/2018: Sinus rhythm.  Rate 67 bpm.  Echo 06/07/15: Study Conclusions  - Left ventricle: Stacey cavity size was normal. There was mild focal basal hypertrophy of Stacey septum. Systolic function was vigorous. Stacey estimated ejection fraction was in Stacey range of 65% to 70%. Wall motion was normal; there were no regional wall motion abnormalities. Doppler parameters are consistent with abnormal left ventricular relaxation (grade 1 diastolic dysfunction). There was no evidence of elevated ventricular filling pressure by Doppler parameters. - Aortic valve: Trileaflet; normal thickness leaflets. There was no regurgitation. - Aortic root: Stacey  aortic root was normal in size. - Ascending aorta: Stacey ascending aorta was normal in size. - Mitral valve: There was trivial regurgitation. - Left atrium: Stacey atrium was normal in size. - Right ventricle: Systolic function was normal. - Tricuspid valve: There was trivial regurgitation. - Pulmonic valve: There was no regurgitation. - Pulmonary arteries: Systolic pressure was within Stacey normal range. - Inferior vena cava: Stacey vessel was normal in size. - Pericardium, extracardiac: There was no pericardial effusion.  Lexiscan Cardiolite 05/24/15:  Nuclear stress EF: 73%.  Stacey left ventricular ejection fraction is hyperdynamic (>65%).  There was no ST segment deviation noted during stress.  Stacey study is normal.  ABIs 01/30/17: normal  Recent Labs: 02/28/2017: ALT 9 01/01/2018: BUN 16; Creatinine, Ser 0.96; Hemoglobin 14.6; Platelets 231; Potassium 4.2; Sodium 138 01/05/2018: TSH 0.73    Lipid Panel    Component Value Date/Time   CHOL 162 02/28/2017 0818   TRIG 310.0 (H) 02/28/2017 0818   HDL 40.00 02/28/2017 0818   CHOLHDL 4 02/28/2017 0818   VLDL 62.0 (H) 02/28/2017  0818   LDLCALC 96 04/02/2010 0850   LDLDIRECT 75.0 02/28/2017 0818      Wt Readings from Last 3 Encounters:  02/24/18 234 lb 3.2 oz (106.2 kg)  02/18/18 235 lb (106.6 kg)  02/09/18 229 lb (103.9 kg)      ASSESSMENT AND PLAN:  # Hypertension:  Blood pressure is well-controlled. Continue losartan.  # Chest pain: Resolved.  Her chest pain was associated with anxiety.  Stress testing 04/2015 was negative for ischemia.     # Claudication: # Numbness: ABIs normal.  This is sciatica and back related.  Surgery is pending.    # Pre-surgical risk assessment:  [name] has no unstable cardiac conditions.  She is at low risk for surgery.    # CV Disease Prevention: She will return for fasting lipids and CMP.  Current medicines are reviewed at length with [name] today.  [name] does not have concerns regarding medicines.  Stacey following changes have been made:  none  Labs/ tests ordered today include:   Orders Placed This Encounter  Procedures  . Lipid panel  . Comprehensive metabolic panel  . EKG 12-Lead      Disposition:   FU with Adalynne Steffensmeier C. Duke Salvia, MD in 1 year.    Signed, Chilton Si, MD  02/24/2018 4:09 PM    [facility]

## 2018-02-26 ENCOUNTER — Encounter: Payer: Self-pay | Admitting: Cardiovascular Disease

## 2018-02-26 NOTE — Progress Notes (Addendum)
PCP: Oliver Barre, MD  Cardiologist: Chilton Si, MD  EKG: 02/24/18 in EPIC  Stress test: 05/23/15 in EPIC  ECHO: 06/07/15 in EPIC  Cardiac Cath: pt denies  Chest x-ray: pt denies past year, no recent respiratory infections/complications

## 2018-02-26 NOTE — Pre-Procedure Instructions (Signed)
[name]  02/26/2018      [id number] - [place], [place] - [address] AT [facility] [address] [place] [address] Phone: [phone] Fax: [phone]    Your procedure is scheduled on March 03, 2018.  Report to [facility] Admitting at 830 AM.  Call this number if you have problems the morning of surgery:  [phone]   Remember:  Do not eat or drink after midnight.    Take these medicines the morning of surgery with A SIP OF WATER  Tylenol-if needed Esomeprazole (nexium) Promethazine (phenergan)-if needed  7 days prior to surgery STOP taking any Aspirin (unless otherwise instructed by your surgeon), Aleve, Naproxen, Ibuprofen, Motrin, Advil, Goody's, BC's, all herbal medications, fish oil, and all vitamins    Do not wear jewelry, make-up or nail polish.  Do not wear lotions, powders, or perfumes, or deodorant.  Do not shave 48 hours prior to surgery.    Do not bring valuables to the hospital.  [facility] is not responsible for any belongings or valuables.  Contacts, dentures or bridgework may not be worn into surgery.  Leave your suitcase in the car.  After surgery it may be brought to your room.  For patients admitted to the hospital, discharge time will be determined by your treatment team.  Patients discharged the day of surgery will not be allowed to drive home.    [facility]- Preparing For Surgery  Before surgery, you can play an important role. Because skin is not sterile, your skin needs to be as free of germs as possible. You can reduce the number of germs on your skin by washing with CHG (chlorahexidine gluconate) Soap before surgery.  CHG is an antiseptic cleaner which kills germs and bonds with the skin to continue killing germs even after washing.    Oral Hygiene is also important to reduce your risk of infection.  Remember - BRUSH YOUR TEETH THE MORNING OF SURGERY WITH YOUR  REGULAR TOOTHPASTE  Please do not use if you have an allergy to CHG or antibacterial soaps. If your skin becomes reddened/irritated stop using the CHG.  Do not shave (including legs and underarms) for at least 48 hours prior to first CHG shower. It is OK to shave your face.  Please follow these instructions carefully.   1. Shower the NIGHT BEFORE SURGERY and the MORNING OF SURGERY with CHG.   2. If you chose to wash your hair, wash your hair first as usual with your normal shampoo.  3. After you shampoo, rinse your hair and body thoroughly to remove the shampoo.  4. Use CHG as you would any other liquid soap. You can apply CHG directly to the skin and wash gently with a scrungie or a clean washcloth.   5. Apply the CHG Soap to your body ONLY FROM THE NECK DOWN.  Do not use on open wounds or open sores. Avoid contact with your eyes, ears, mouth and genitals (private parts). Wash Face and genitals (private parts)  with your normal soap.  6. Wash thoroughly, paying special attention to the area where your surgery will be performed.  7. Thoroughly rinse your body with warm water from the neck down.  8. DO NOT shower/wash with your normal soap after using and rinsing off the CHG Soap.  9. Pat yourself dry with a CLEAN TOWEL.  10. Wear CLEAN PAJAMAS to bed the night before surgery, wear comfortable  clothes the morning of surgery  11. Place CLEAN SHEETS on your bed the night of your first shower and DO NOT SLEEP WITH PETS.  Day of Surgery:  Do not apply any deodorants/lotions.  Please wear clean clothes to the hospital/surgery center.   Remember to brush your teeth WITH YOUR REGULAR TOOTHPASTE.   Please read over the fact sheets that you were given.

## 2018-02-27 ENCOUNTER — Encounter (HOSPITAL_COMMUNITY): Payer: Self-pay

## 2018-02-27 ENCOUNTER — Encounter (HOSPITAL_COMMUNITY)
Admission: RE | Admit: 2018-02-27 | Discharge: 2018-02-27 | Disposition: A | Discharge status: Home / Self Care | Payer: 59 | Source: Ambulatory Visit | Attending: Neurosurgery | Admitting: Neurosurgery

## 2018-02-27 ENCOUNTER — Other Ambulatory Visit: Payer: Self-pay

## 2018-02-27 DIAGNOSIS — K219 Gastro-esophageal reflux disease without esophagitis: Secondary | ICD-10-CM | POA: Insufficient documentation

## 2018-02-27 DIAGNOSIS — M5136 Other intervertebral disc degeneration, lumbar region: Secondary | ICD-10-CM | POA: Insufficient documentation

## 2018-02-27 DIAGNOSIS — D649 Anemia, unspecified: Secondary | ICD-10-CM | POA: Diagnosis not present

## 2018-02-27 DIAGNOSIS — Z01818 Encounter for other preprocedural examination: Secondary | ICD-10-CM | POA: Insufficient documentation

## 2018-02-27 DIAGNOSIS — E119 Type 2 diabetes mellitus without complications: Secondary | ICD-10-CM | POA: Insufficient documentation

## 2018-02-27 DIAGNOSIS — I10 Essential (primary) hypertension: Secondary | ICD-10-CM | POA: Diagnosis not present

## 2018-02-27 LAB — BASIC METABOLIC PANEL
Anion gap: 9 (ref 5–15)
BUN: 8 mg/dL (ref 6–20)
CHLORIDE: 108 mmol/L (ref 98–111)
CO2: 22 mmol/L (ref 22–32)
CREATININE: 0.88 mg/dL (ref 0.44–1.00)
Calcium: 9.3 mg/dL (ref 8.9–10.3)
GFR calc Af Amer: >60 mL/min (ref >60)
GFR calc non Af Amer: >60 mL/min (ref >60)
GLUCOSE: 120 mg/dL — AB (ref 70–99)
Potassium: 3.8 mmol/L (ref 3.5–5.1)
Sodium: 139 mmol/L (ref 135–145)

## 2018-02-27 LAB — CBC
HCT: 42.5 % (ref 36.0–46.0)
Hemoglobin: 13.5 g/dL (ref 12.0–15.0)
MCH: 29.9 pg (ref 26.0–34.0)
MCHC: 31.8 g/dL (ref 30.0–36.0)
MCV: 94 fL (ref 78.0–100.0)
PLATELETS: 195 10*3/uL (ref 150–400)
RBC: 4.52 MIL/uL (ref 3.87–5.11)
RDW: 13 % (ref 11.5–15.5)
WBC: 5.9 10*3/uL (ref 4.0–10.5)

## 2018-02-27 LAB — TYPE AND SCREEN
ABO/RH(D): B NEG
Antibody Screen: NEGATIVE

## 2018-02-28 LAB — SURGICAL PCR SCREEN

## 2018-02-28 NOTE — Progress Notes (Signed)
Micro lab advised that PCR results are invalid. Specimen will need recollect DOS.

## 2018-03-02 MED ORDER — VANCOMYCIN HCL 10 G IV SOLR
1500.0000 mg | INTRAVENOUS | Status: AC
  Administered 2018-03-03: 1500 mg via INTRAVENOUS
  Filled 2018-03-02: qty 1500

## 2018-03-02 NOTE — Progress Notes (Signed)
Anesthesia Chart Review:  Case:  [id number] Date/Time:  03/03/18 1152   Procedure:  PLIF - L4-L5 (N/A Back)   Anesthesia type:  General   Pre-op diagnosis:  spondylolisthesis   Location:  [facility] ROOM 20 / [facility]   Surgeon:  Julio Sicks, MD      DISCUSSION: 57 yo female never smoker scheduled for above procedure. Pertinent medical hx includes anemia, anxiety, GERD, severe PONV.  Pt's surgery was originally scheduled 01/09/2018 but rescheduled due to poison ivy, yeast infection and hyperglycemia secondary to steroids, and than rescheduled again due to shingles. She has been seen by PCP and cardiologist in the interim and cleared for surgery.   Pt previously saw Dr. Chilton Si for HTN and chest pain. Her symptoms were felt to be neurocardiogenic and she was asked to wear compression stockings (also to help with her varicose veins) and increase her fluid intake. Lab testing for pheochromocytoma and carcinoid were negative. She underwent Lexiscan Cardiolite 04/2015 that was negative for ischemia and echocardiogram 05/2015 revealed grade 1 diastolic dysfunction. Her blood pressure was intermittently elevated. She reported having a lot of anxiety and stress at the time.   I saw pt at previous PAT appt and discussed her anesthesia history. She reported that with previous surgery she has had significant post operative nausea and vomiting. For this reason she is very anxious about her upcoming procedure. I assured her the anesthesiologists would be made aware of her history and every effort would be made to keep her comfortable. She did report that in 2003 she had bladder surgery and did not experience any PONV.  Anticipate pt can proceed with surgery as scheduled barring any acute status change.  VS: BP 129/72   Pulse 65   Temp 36.8 C (Oral)   Ht 5\' 5"  (1.651 m)   Wt 107.1 kg   SpO2 99%   BMI 39.29 kg/m   PROVIDERS: Corwin Levins, MD is PCP last seen 02/09/2018  Chilton Si, MD is  Cardiologist last seen 02/24/2018  LABS: Labs reviewed: Acceptable for surgery. PCR to be collected DOS (all labs ordered are listed, but only abnormal results are displayed)  Labs Reviewed  SURGICAL PCR SCREEN - Abnormal; Notable for the following components:      Result Value   MRSA, PCR   (*)    Value: INVALID, UNABLE TO DETERMINE THE PRESENCE OF TARGET DNA DUE TO SPECIMEN INTEGRITY. RECOLLECTION REQUESTED.   Staphylococcus aureus   (*)    Value: INVALID, UNABLE TO DETERMINE THE PRESENCE OF TARGET DNA DUE TO SPECIMEN INTEGRITY. RECOLLECTION REQUESTED.   All other components within normal limits  BASIC METABOLIC PANEL - Abnormal; Notable for the following components:   Glucose, Bld 120 (*)    All other components within normal limits  CBC  TYPE AND SCREEN     IMAGES: N/A   EKG:  02/24/2018: NSR. Nonspecific ST and T wave abnormality   CV: Echo 06/07/15: Study Conclusions  - Left ventricle: The cavity size was normal. There was mild focal basal hypertrophy of the septum. Systolic function was vigorous. The estimated ejection fraction was in the range of 65% to 70%. Wall motion was normal; there were no regional wall motion abnormalities. Doppler parameters are consistent with abnormal left ventricular relaxation (grade 1 diastolic dysfunction). There was no evidence of elevated ventricular filling pressure by Doppler parameters. - Aortic valve: Trileaflet; normal thickness leaflets. There was no regurgitation. - Aortic root: The aortic root was normal in  size. - Ascending aorta: The ascending aorta was normal in size. - Mitral valve: There was trivial regurgitation. - Left atrium: The atrium was normal in size. - Right ventricle: Systolic function was normal. - Tricuspid valve: There was trivial regurgitation. - Pulmonic valve: There was no regurgitation. - Pulmonary arteries: Systolic pressure was within the normal range. - Inferior vena cava:  The vessel was normal in size. - Pericardium, extracardiac: There was no pericardial effusion.  Lexiscan Cardiolite 05/24/15:  Nuclear stress EF: 73%.  The left ventricular ejection fraction is hyperdynamic (>65%).  There was no ST segment deviation noted during stress.  The study is normal.  ABIs 01/30/17: normal  Past Medical History:  Diagnosis Date  . Anemia   . ANEMIA-IRON DEFICIENCY 01/03/2007  . Anxiety   . ANXIETY 01/03/2007  . Complication of anesthesia    severe post-operative nausea except for bladder surgery in 2002 at [facility]-did not have complications then  . Depression   . DEPRESSION 01/03/2007  . DJD (degenerative joint disease)    knee  . Family history of adverse reaction to anesthesia    mother and father both had severe post-operative nausea and vomiting  . GERD 01/16/2009  . GERD (gastroesophageal reflux disease)   . Headache(784.0) 01/03/2007  . Hyperplastic colon polyp    04/2010  . HYPERSOMNIA 04/04/2010  . Hypertension   . IBS 01/16/2009  . IBS (irritable bowel syndrome)   . Impaired glucose tolerance 05/18/2011  . Menopause    early  . Migraine   . Obesity   . 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 Wheezing 07/02/2010    Past Surgical History:  Procedure Laterality Date  . ABDOMINAL HYSTERECTOMY    . APPENDECTOMY    . bladder tac    . CHOLECYSTECTOMY    . KNEE ARTHROSCOPY Bilateral   . OOPHORECTOMY      MEDICATIONS: . acetaminophen (TYLENOL) 500 MG tablet  . esomeprazole (NEXIUM) 20 MG capsule  . estradiol (ESTRACE) 0.1 MG/GM vaginal cream  . estradiol (ESTRACE) 1 MG tablet  . losartan (COZAAR) 25 MG tablet  . Probiotic CAPS  . promethazine (PHENERGAN) 25 MG tablet  . triamcinolone cream (KENALOG) 0.1 %   No current facility-administered medications for this encounter.      Zannie Cove [facility] Short Stay Center/Anesthesiology Phone [phone] 03/02/2018 9:45 AM

## 2018-03-03 ENCOUNTER — Inpatient Hospital Stay (HOSPITAL_COMMUNITY): Payer: 59 | Admitting: Anesthesiology

## 2018-03-03 ENCOUNTER — Inpatient Hospital Stay (HOSPITAL_COMMUNITY)
Admission: RE | Admit: 2018-03-03 | Discharge: 2018-03-04 | DRG: 455 | Disposition: A | Discharge status: Home / Self Care | Payer: 59 | Source: Ambulatory Visit | Attending: Neurosurgery | Admitting: Neurosurgery

## 2018-03-03 ENCOUNTER — Inpatient Hospital Stay (HOSPITAL_COMMUNITY): Payer: 59 | Admitting: Physician Assistant

## 2018-03-03 ENCOUNTER — Inpatient Hospital Stay (HOSPITAL_COMMUNITY)
Admission: RE | Disposition: A | Discharge status: Home / Self Care | Payer: Self-pay | Source: Ambulatory Visit | Attending: Neurosurgery

## 2018-03-03 ENCOUNTER — Inpatient Hospital Stay (HOSPITAL_COMMUNITY): Payer: 59

## 2018-03-03 ENCOUNTER — Encounter (HOSPITAL_COMMUNITY): Payer: Self-pay | Admitting: *Deleted

## 2018-03-03 DIAGNOSIS — R7302 Impaired glucose tolerance (oral): Secondary | ICD-10-CM | POA: Diagnosis present

## 2018-03-03 DIAGNOSIS — Z90722 Acquired absence of ovaries, bilateral: Secondary | ICD-10-CM | POA: Diagnosis not present

## 2018-03-03 DIAGNOSIS — Z419 Encounter for procedure for purposes other than remedying health state, unspecified: Secondary | ICD-10-CM

## 2018-03-03 DIAGNOSIS — Z880 Allergy status to penicillin: Secondary | ICD-10-CM

## 2018-03-03 DIAGNOSIS — K219 Gastro-esophageal reflux disease without esophagitis: Secondary | ICD-10-CM | POA: Diagnosis present

## 2018-03-03 DIAGNOSIS — M170 Bilateral primary osteoarthritis of knee: Secondary | ICD-10-CM | POA: Diagnosis present

## 2018-03-03 DIAGNOSIS — Z86010 Personal history of colonic polyps: Secondary | ICD-10-CM

## 2018-03-03 DIAGNOSIS — M48062 Spinal stenosis, lumbar region with neurogenic claudication: Secondary | ICD-10-CM | POA: Diagnosis not present

## 2018-03-03 DIAGNOSIS — I10 Essential (primary) hypertension: Secondary | ICD-10-CM | POA: Diagnosis present

## 2018-03-03 DIAGNOSIS — M4326 Fusion of spine, lumbar region: Secondary | ICD-10-CM | POA: Diagnosis not present

## 2018-03-03 DIAGNOSIS — Z885 Allergy status to narcotic agent status: Secondary | ICD-10-CM | POA: Diagnosis not present

## 2018-03-03 DIAGNOSIS — M4316 Spondylolisthesis, lumbar region: Secondary | ICD-10-CM | POA: Diagnosis not present

## 2018-03-03 DIAGNOSIS — F419 Anxiety disorder, unspecified: Secondary | ICD-10-CM | POA: Diagnosis present

## 2018-03-03 DIAGNOSIS — K589 Irritable bowel syndrome without diarrhea: Secondary | ICD-10-CM | POA: Diagnosis present

## 2018-03-03 DIAGNOSIS — Z90710 Acquired absence of both cervix and uterus: Secondary | ICD-10-CM

## 2018-03-03 DIAGNOSIS — Z888 Allergy status to other drugs, medicaments and biological substances status: Secondary | ICD-10-CM | POA: Diagnosis not present

## 2018-03-03 DIAGNOSIS — Z8249 Family history of ischemic heart disease and other diseases of the circulatory system: Secondary | ICD-10-CM

## 2018-03-03 DIAGNOSIS — Z9049 Acquired absence of other specified parts of digestive tract: Secondary | ICD-10-CM

## 2018-03-03 DIAGNOSIS — F329 Major depressive disorder, single episode, unspecified: Secondary | ICD-10-CM | POA: Diagnosis present

## 2018-03-03 DIAGNOSIS — Z79899 Other long term (current) drug therapy: Secondary | ICD-10-CM | POA: Diagnosis not present

## 2018-03-03 DIAGNOSIS — E119 Type 2 diabetes mellitus without complications: Secondary | ICD-10-CM | POA: Diagnosis not present

## 2018-03-03 DIAGNOSIS — M4310 Spondylolisthesis, site unspecified: Secondary | ICD-10-CM | POA: Diagnosis present

## 2018-03-03 DIAGNOSIS — Z881 Allergy status to other antibiotic agents status: Secondary | ICD-10-CM | POA: Diagnosis not present

## 2018-03-03 HISTORY — DX: Other specified postprocedural states: Z98.890

## 2018-03-03 HISTORY — DX: Nausea with vomiting, unspecified: R11.2

## 2018-03-03 SURGERY — POSTERIOR LUMBAR FUSION 1 LEVEL
Anesthesia: General | Site: Back

## 2018-03-03 MED ORDER — LOSARTAN POTASSIUM 50 MG PO TABS: 25.0000 mg | ORAL_TABLET | Freq: Every day | ORAL | Status: DC

## 2018-03-03 MED ORDER — DIAZEPAM 5 MG PO TABS
5.0000 mg | ORAL_TABLET | Freq: Four times a day (QID) | ORAL | Status: DC | PRN
  Administered 2018-03-03: 10 mg via ORAL
  Administered 2018-03-03: 5 mg via ORAL
  Administered 2018-03-04: 10 mg via ORAL
  Administered 2018-03-04: 5 mg via ORAL
  Filled 2018-03-03: qty 1
  Filled 2018-03-03 (×2): qty 2

## 2018-03-03 MED ORDER — DEXAMETHASONE SODIUM PHOSPHATE 10 MG/ML IJ SOLN
INTRAMUSCULAR | Status: AC
  Filled 2018-03-03: qty 1

## 2018-03-03 MED ORDER — SODIUM CHLORIDE 0.9% FLUSH: 3.0000 mL | Freq: Two times a day (BID) | INTRAVENOUS | Status: DC

## 2018-03-03 MED ORDER — MIDAZOLAM HCL 5 MG/5ML IJ SOLN
INTRAMUSCULAR | Status: DC | PRN
  Administered 2018-03-03: 2 mg via INTRAVENOUS

## 2018-03-03 MED ORDER — POLYETHYLENE GLYCOL 3350 17 G PO PACK: 17.0000 g | PACK | Freq: Every day | ORAL | Status: DC | PRN

## 2018-03-03 MED ORDER — LIDOCAINE HCL (CARDIAC) PF 100 MG/5ML IV SOSY
PREFILLED_SYRINGE | INTRAVENOUS | Status: DC | PRN
  Administered 2018-03-03: 100 mg via INTRAVENOUS

## 2018-03-03 MED ORDER — BACID PO TABS
1.0000 | ORAL_TABLET | Freq: Every day | ORAL | Status: DC
  Administered 2018-03-04: 1 via ORAL
  Filled 2018-03-03: qty 1

## 2018-03-03 MED ORDER — ACETAMINOPHEN 325 MG PO TABS
650.0000 mg | ORAL_TABLET | ORAL | Status: DC | PRN
  Administered 2018-03-03 – 2018-03-04 (×3): 650 mg via ORAL
  Filled 2018-03-03 (×3): qty 2

## 2018-03-03 MED ORDER — BUPIVACAINE HCL (PF) 0.25 % IJ SOLN
INTRAMUSCULAR | Status: AC
  Filled 2018-03-03: qty 30

## 2018-03-03 MED ORDER — FENTANYL CITRATE (PF) 100 MCG/2ML IJ SOLN
25.0000 ug | INTRAMUSCULAR | Status: DC | PRN
  Administered 2018-03-03: 25 ug via INTRAVENOUS

## 2018-03-03 MED ORDER — PROPOFOL 10 MG/ML IV BOLUS
INTRAVENOUS | Status: AC
  Filled 2018-03-03: qty 20

## 2018-03-03 MED ORDER — HYDROCODONE-ACETAMINOPHEN 10-325 MG PO TABS: 1.0000 | ORAL_TABLET | ORAL | Status: DC | PRN

## 2018-03-03 MED ORDER — PHENYLEPHRINE 40 MCG/ML (10ML) SYRINGE FOR IV PUSH (FOR BLOOD PRESSURE SUPPORT)
PREFILLED_SYRINGE | INTRAVENOUS | Status: AC
  Filled 2018-03-03: qty 10

## 2018-03-03 MED ORDER — PROMETHAZINE HCL 25 MG PO TABS: 25.0000 mg | ORAL_TABLET | Freq: Every day | ORAL | Status: DC | PRN

## 2018-03-03 MED ORDER — VANCOMYCIN HCL 1 G IV SOLR
INTRAVENOUS | Status: DC | PRN
  Administered 2018-03-03: 1000 mg

## 2018-03-03 MED ORDER — VANCOMYCIN HCL IN DEXTROSE 1-5 GM/200ML-% IV SOLN
INTRAVENOUS | Status: AC
  Administered 2018-03-03: 1000 mg
  Filled 2018-03-03: qty 200

## 2018-03-03 MED ORDER — ONDANSETRON HCL 4 MG/2ML IJ SOLN: 4.0000 mg | Freq: Four times a day (QID) | INTRAMUSCULAR | Status: DC | PRN

## 2018-03-03 MED ORDER — SODIUM CHLORIDE 0.9% FLUSH: 3.0000 mL | INTRAVENOUS | Status: DC | PRN

## 2018-03-03 MED ORDER — ONDANSETRON HCL 4 MG PO TABS: 4.0000 mg | ORAL_TABLET | Freq: Four times a day (QID) | ORAL | Status: DC | PRN

## 2018-03-03 MED ORDER — THROMBIN 5000 UNITS EX SOLR
CUTANEOUS | Status: AC
  Filled 2018-03-03: qty 5000

## 2018-03-03 MED ORDER — ESTRADIOL 0.1 MG/GM VA CREA
1.0000 | TOPICAL_CREAM | VAGINAL | Status: DC
  Filled 2018-03-03: qty 42.5

## 2018-03-03 MED ORDER — BUPIVACAINE HCL (PF) 0.25 % IJ SOLN
INTRAMUSCULAR | Status: DC | PRN
  Administered 2018-03-03: 20 mL

## 2018-03-03 MED ORDER — FLEET ENEMA 7-19 GM/118ML RE ENEM: 1.0000 | ENEMA | Freq: Once | RECTAL | Status: DC | PRN

## 2018-03-03 MED ORDER — ROCURONIUM BROMIDE 10 MG/ML (PF) SYRINGE
PREFILLED_SYRINGE | INTRAVENOUS | Status: DC | PRN
  Administered 2018-03-03: 10 mg via INTRAVENOUS
  Administered 2018-03-03: 30 mg via INTRAVENOUS
  Administered 2018-03-03: 50 mg via INTRAVENOUS

## 2018-03-03 MED ORDER — DIAZEPAM 5 MG PO TABS
ORAL_TABLET | ORAL | Status: AC
  Filled 2018-03-03: qty 1

## 2018-03-03 MED ORDER — MENTHOL 3 MG MT LOZG: 1.0000 | LOZENGE | OROMUCOSAL | Status: DC | PRN

## 2018-03-03 MED ORDER — VANCOMYCIN HCL 1000 MG IV SOLR
INTRAVENOUS | Status: AC
  Filled 2018-03-03: qty 1000

## 2018-03-03 MED ORDER — PANTOPRAZOLE SODIUM 40 MG PO TBEC
40.0000 mg | DELAYED_RELEASE_TABLET | Freq: Every day | ORAL | Status: DC
  Administered 2018-03-04: 40 mg via ORAL
  Filled 2018-03-03: qty 1

## 2018-03-03 MED ORDER — HYDROMORPHONE HCL 1 MG/ML IJ SOLN: 1.0000 mg | INTRAMUSCULAR | Status: DC | PRN

## 2018-03-03 MED ORDER — PROPOFOL 500 MG/50ML IV EMUL
INTRAVENOUS | Status: DC | PRN
  Administered 2018-03-03: 25 ug/kg/min via INTRAVENOUS

## 2018-03-03 MED ORDER — LIDOCAINE 2% (20 MG/ML) 5 ML SYRINGE
INTRAMUSCULAR | Status: DC | PRN
  Administered 2018-03-03: 40 mg via INTRAVENOUS

## 2018-03-03 MED ORDER — CHLORHEXIDINE GLUCONATE CLOTH 2 % EX PADS: 6.0000 | MEDICATED_PAD | Freq: Once | CUTANEOUS | Status: DC

## 2018-03-03 MED ORDER — SODIUM CHLORIDE 0.9 % IV SOLN: 250.0000 mL | INTRAVENOUS | Status: DC

## 2018-03-03 MED ORDER — PROPOFOL 10 MG/ML IV BOLUS
INTRAVENOUS | Status: DC | PRN
  Administered 2018-03-03: 20 mg via INTRAVENOUS
  Administered 2018-03-03: 160 mg via INTRAVENOUS
  Administered 2018-03-03: 10 mg via INTRAVENOUS

## 2018-03-03 MED ORDER — DIPHENHYDRAMINE HCL 50 MG/ML IJ SOLN: 25.0000 mg | Freq: Four times a day (QID) | INTRAMUSCULAR | Status: DC | PRN

## 2018-03-03 MED ORDER — PHENOL 1.4 % MT LIQD: 1.0000 | OROMUCOSAL | Status: DC | PRN

## 2018-03-03 MED ORDER — LACTATED RINGERS IV SOLN
INTRAVENOUS | Status: DC
  Administered 2018-03-03 (×2): via INTRAVENOUS

## 2018-03-03 MED ORDER — SUCCINYLCHOLINE CHLORIDE 20 MG/ML IJ SOLN
INTRAMUSCULAR | Status: DC | PRN
  Administered 2018-03-03: 140 mg via INTRAVENOUS

## 2018-03-03 MED ORDER — ACETAMINOPHEN 650 MG RE SUPP: 650.0000 mg | RECTAL | Status: DC | PRN

## 2018-03-03 MED ORDER — FENTANYL CITRATE (PF) 100 MCG/2ML IJ SOLN
12.5000 ug | INTRAMUSCULAR | Status: DC | PRN
  Administered 2018-03-03 (×2): 12.5 ug via INTRAVENOUS
  Filled 2018-03-03: qty 2

## 2018-03-03 MED ORDER — BISACODYL 10 MG RE SUPP: 10.0000 mg | Freq: Every day | RECTAL | Status: DC | PRN

## 2018-03-03 MED ORDER — FENTANYL CITRATE (PF) 100 MCG/2ML IJ SOLN
INTRAMUSCULAR | Status: AC
  Filled 2018-03-03: qty 2

## 2018-03-03 MED ORDER — ESTRADIOL 1 MG PO TABS
1.0000 mg | ORAL_TABLET | Freq: Every day | ORAL | Status: DC
  Administered 2018-03-03: 1 mg via ORAL
  Filled 2018-03-03 (×2): qty 1

## 2018-03-03 MED ORDER — OXYCODONE HCL 5 MG PO TABS: 10.0000 mg | ORAL_TABLET | ORAL | Status: DC | PRN

## 2018-03-03 MED ORDER — SODIUM CHLORIDE 0.9 % IV SOLN
INTRAVENOUS | Status: DC | PRN
  Administered 2018-03-03: 500 mL

## 2018-03-03 MED ORDER — DEXAMETHASONE SODIUM PHOSPHATE 10 MG/ML IJ SOLN
10.0000 mg | INTRAMUSCULAR | Status: AC
  Administered 2018-03-03: 10 mg via INTRAVENOUS

## 2018-03-03 MED ORDER — FENTANYL CITRATE (PF) 250 MCG/5ML IJ SOLN
INTRAMUSCULAR | Status: AC
  Filled 2018-03-03: qty 5

## 2018-03-03 MED ORDER — MIDAZOLAM HCL 2 MG/2ML IJ SOLN
INTRAMUSCULAR | Status: AC
  Filled 2018-03-03: qty 2

## 2018-03-03 MED ORDER — SCOPOLAMINE 1 MG/3DAYS TD PT72
1.0000 | MEDICATED_PATCH | TRANSDERMAL | Status: DC
  Administered 2018-03-03: 1.5 mg via TRANSDERMAL

## 2018-03-03 MED ORDER — FENTANYL CITRATE (PF) 100 MCG/2ML IJ SOLN
INTRAMUSCULAR | Status: DC | PRN
  Administered 2018-03-03 (×5): 50 ug via INTRAVENOUS

## 2018-03-03 MED ORDER — ROCURONIUM BROMIDE 50 MG/5ML IV SOSY
PREFILLED_SYRINGE | INTRAVENOUS | Status: AC
  Filled 2018-03-03: qty 5

## 2018-03-03 MED ORDER — 0.9 % SODIUM CHLORIDE (POUR BTL) OPTIME
TOPICAL | Status: DC | PRN
  Administered 2018-03-03: 1000 mL

## 2018-03-03 MED ORDER — THROMBIN 20000 UNITS EX SOLR
CUTANEOUS | Status: DC | PRN
  Administered 2018-03-03: 20 mL via TOPICAL

## 2018-03-03 MED ORDER — THROMBIN 20000 UNITS EX KIT
PACK | CUTANEOUS | Status: AC
  Filled 2018-03-03: qty 1

## 2018-03-03 MED ORDER — ONDANSETRON HCL 4 MG/2ML IJ SOLN
INTRAMUSCULAR | Status: DC | PRN
  Administered 2018-03-03 (×2): 4 mg via INTRAVENOUS

## 2018-03-03 MED ORDER — SUGAMMADEX SODIUM 200 MG/2ML IV SOLN
INTRAVENOUS | Status: DC | PRN
  Administered 2018-03-03: 400 mg via INTRAVENOUS

## 2018-03-03 MED ORDER — SCOPOLAMINE 1 MG/3DAYS TD PT72
MEDICATED_PATCH | TRANSDERMAL | Status: AC
  Administered 2018-03-03: 1.5 mg via TRANSDERMAL
  Filled 2018-03-03: qty 1

## 2018-03-03 SURGICAL SUPPLY — 71 items
ADH SKN CLS APL DERMABOND .7 (GAUZE/BANDAGES/DRESSINGS) ×1
APL SKNCLS STERI-STRIP NONHPOA (GAUZE/BANDAGES/DRESSINGS) ×1
BAG DECANTER FOR FLEXI CONT (MISCELLANEOUS) ×2 IMPLANT
BENZOIN TINCTURE PRP APPL 2/3 (GAUZE/BANDAGES/DRESSINGS) ×2 IMPLANT
BLADE CLIPPER SURG (BLADE) IMPLANT
BUR CUTTER 7.0 ROUND (BURR) ×1 IMPLANT
BUR MATCHSTICK NEURO 3.0 LAGG (BURR) ×2 IMPLANT
CANISTER SUCT 3000ML PPV (MISCELLANEOUS) ×2 IMPLANT
CAP LCK SPNE (Orthopedic Implant) ×4 IMPLANT
CAP LOCK SPINE RADIUS (Orthopedic Implant) IMPLANT
CAP LOCKING (Orthopedic Implant) ×8 IMPLANT
CARTRIDGE OIL MAESTRO DRILL (MISCELLANEOUS) ×1 IMPLANT
CLSR STERI-STRIP ANTIMIC 1/2X4 (GAUZE/BANDAGES/DRESSINGS) ×1 IMPLANT
CONT SPEC 4OZ CLIKSEAL STRL BL (MISCELLANEOUS) ×2 IMPLANT
COVER BACK TABLE 60X90IN (DRAPES) ×2 IMPLANT
DECANTER SPIKE VIAL GLASS SM (MISCELLANEOUS) ×2 IMPLANT
DERMABOND ADVANCED (GAUZE/BANDAGES/DRESSINGS) ×1
DERMABOND ADVANCED .7 DNX12 (GAUZE/BANDAGES/DRESSINGS) ×1 IMPLANT
DEVICE INTERBODY ELEVATE 23X9 (Cage) ×2 IMPLANT
DIFFUSER DRILL AIR PNEUMATIC (MISCELLANEOUS) ×2 IMPLANT
DRAPE C-ARM 42X72 X-RAY (DRAPES) ×4 IMPLANT
DRAPE HALF SHEET 40X57 (DRAPES) IMPLANT
DRAPE LAPAROTOMY 100X72X124 (DRAPES) ×2 IMPLANT
DRAPE SURG 17X23 STRL (DRAPES) ×8 IMPLANT
DRSG OPSITE POSTOP 4X6 (GAUZE/BANDAGES/DRESSINGS) ×2 IMPLANT
DURAPREP 26ML APPLICATOR (WOUND CARE) ×2 IMPLANT
ELECT REM PT RETURN 9FT ADLT (ELECTROSURGICAL) ×2
ELECTRODE REM PT RTRN 9FT ADLT (ELECTROSURGICAL) ×1 IMPLANT
EVACUATOR 1/8 PVC DRAIN (DRAIN) IMPLANT
GAUZE 4X4 16PLY RFD (DISPOSABLE) IMPLANT
GAUZE SPONGE 4X4 12PLY STRL (GAUZE/BANDAGES/DRESSINGS) IMPLANT
GLOVE BIOGEL M 6.5 STRL (GLOVE) ×2 IMPLANT
GLOVE BIOGEL PI IND STRL 6.5 (GLOVE) IMPLANT
GLOVE BIOGEL PI IND STRL 7.0 (GLOVE) IMPLANT
GLOVE BIOGEL PI IND STRL 7.5 (GLOVE) IMPLANT
GLOVE BIOGEL PI IND STRL 8 (GLOVE) IMPLANT
GLOVE BIOGEL PI INDICATOR 6.5 (GLOVE) ×3
GLOVE BIOGEL PI INDICATOR 7.0 (GLOVE) ×2
GLOVE BIOGEL PI INDICATOR 7.5 (GLOVE) ×3
GLOVE BIOGEL PI INDICATOR 8 (GLOVE) ×2
GLOVE ECLIPSE 9.0 STRL (GLOVE) ×4 IMPLANT
GLOVE EXAM NITRILE LRG STRL (GLOVE) IMPLANT
GLOVE EXAM NITRILE XL STR (GLOVE) IMPLANT
GLOVE EXAM NITRILE XS STR PU (GLOVE) IMPLANT
GLOVE SURG SS PI 6.0 STRL IVOR (GLOVE) ×2 IMPLANT
GOWN STRL REUS W/ TWL LRG LVL3 (GOWN DISPOSABLE) IMPLANT
GOWN STRL REUS W/ TWL XL LVL3 (GOWN DISPOSABLE) ×2 IMPLANT
GOWN STRL REUS W/TWL 2XL LVL3 (GOWN DISPOSABLE) ×1 IMPLANT
GOWN STRL REUS W/TWL LRG LVL3 (GOWN DISPOSABLE) ×6
GOWN STRL REUS W/TWL XL LVL3 (GOWN DISPOSABLE) ×4
KIT BASIN OR (CUSTOM PROCEDURE TRAY) ×2 IMPLANT
KIT TURNOVER KIT B (KITS) ×2 IMPLANT
MILL MEDIUM DISP (BLADE) ×2 IMPLANT
NEEDLE HYPO 22GX1.5 SAFETY (NEEDLE) ×2 IMPLANT
NS IRRIG 1000ML POUR BTL (IV SOLUTION) ×2 IMPLANT
OIL CARTRIDGE MAESTRO DRILL (MISCELLANEOUS) ×2
PACK LAMINECTOMY NEURO (CUSTOM PROCEDURE TRAY) ×2 IMPLANT
ROD RADIUS 40MM (Neuro Prosthesis/Implant) ×4 IMPLANT
ROD SPNL 40X5.5XNS TI RDS (Neuro Prosthesis/Implant) IMPLANT
SCREW 5.75X40M (Screw) ×2 IMPLANT
SCREW 5.75X45MM (Screw) ×2 IMPLANT
SPONGE SURGIFOAM ABS GEL 100 (HEMOSTASIS) ×2 IMPLANT
STRIP CLOSURE SKIN 1/2X4 (GAUZE/BANDAGES/DRESSINGS) ×4 IMPLANT
SUT VIC AB 0 CT1 18XCR BRD8 (SUTURE) ×2 IMPLANT
SUT VIC AB 0 CT1 8-18 (SUTURE) ×2
SUT VIC AB 2-0 CT1 18 (SUTURE) ×2 IMPLANT
SUT VIC AB 3-0 SH 8-18 (SUTURE) ×4 IMPLANT
TOWEL GREEN STERILE (TOWEL DISPOSABLE) ×2 IMPLANT
TOWEL GREEN STERILE FF (TOWEL DISPOSABLE) ×2 IMPLANT
TRAY FOLEY MTR SLVR 16FR STAT (SET/KITS/TRAYS/PACK) ×2 IMPLANT
WATER STERILE IRR 1000ML POUR (IV SOLUTION) ×2 IMPLANT

## 2018-03-03 NOTE — H&P (Signed)
[name] is an 57 y.o. female.   Chief Complaint: Back pain HPI: 57 year old female with back pain with bilateral lower extremity numbness paresthesias and intermittent weakness consistent with neurogenic claudication which is failed conservative management.  Work-up demonstrates evidence of a significant grade 1 L4-5 degenerative spondylolisthesis with marked facet arthropathy and severe stenosis.  Patient presents now for decompression and fusion surgery in hopes of improving her symptoms.  Past Medical History:  Diagnosis Date  . Anemia   . ANEMIA-IRON DEFICIENCY 01/03/2007  . Anxiety   . ANXIETY 01/03/2007  . Complication of anesthesia    severe post-operative nausea except for bladder surgery in 2002 at [facility]-did not have complications then  . Depression   . DEPRESSION 01/03/2007  . DJD (degenerative joint disease)    knee  . Family history of adverse reaction to anesthesia    mother and father both had severe post-operative nausea and vomiting  . GERD 01/16/2009  . GERD (gastroesophageal reflux disease)   . Headache(784.0) 01/03/2007  . Hyperplastic colon polyp    04/2010  . HYPERSOMNIA 04/04/2010  . Hypertension   . IBS 01/16/2009  . IBS (irritable bowel syndrome)   . Impaired glucose tolerance 05/18/2011  . Menopause    early  . Migraine   . Obesity   . 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 PONV (postoperative nausea and vomiting)   . Wheezing 07/02/2010    Past Surgical History:  Procedure Laterality Date  . ABDOMINAL HYSTERECTOMY    . APPENDECTOMY    . bladder tac    . CHOLECYSTECTOMY    . KNEE ARTHROSCOPY Bilateral   . OOPHORECTOMY      Family History  Problem Relation Age of Onset  . Hypertension Mother   . Diabetes Brother    Social History:  reports that she has never smoked. She has never used smokeless tobacco. She reports that she does not drink alcohol or use drugs.  Allergies:  Allergies  Allergen Reactions  . Meperidine  Shortness Of Breath  . Morphine Anaphylaxis  . Penicillins Hives, Nausea Only, Swelling and Other (See Comments)    PATIENT HAS HAD A PCN REACTION WITH IMMEDIATE RASH, FACIAL/TONGUE/THROAT SWELLING, SOB, OR LIGHTHEADEDNESS WITH HYPOTENSION:  #  #  YES  #  #  HAS PT DEVELOPED SEVERE RASH INVOLVING MUCUS MEMBRANES or SKIN NECROSIS: #  #  YES  #  #  PATIENT HAS HAD A PCN REACTION THAT REQUIRED HOSPITALIZATION:  #  #  YES  #  #  Has patient had a PCN reaction occurring within the last 10 years: No   . Lisinopril Cough       . Amoxicillin Nausea Only  . Azithromycin Nausea And Vomiting  . Codeine Nausea Only and Other (See Comments)    Hot flashes also  . Hydrocodone Itching, Nausea Only and Rash  . Omeprazole Itching and Rash  . Oxycodone-Acetaminophen Nausea And Vomiting and Rash  . Sucralfate Rash and Other (See Comments)    Blurry vision   . Tramadol Itching, Nausea Only and Rash    Medications Prior to Admission  Medication Sig Dispense Refill  . acetaminophen (TYLENOL) 500 MG tablet Take 1,000 mg by mouth daily as needed for moderate pain or headache.    . esomeprazole (NEXIUM) 20 MG capsule Take 20 mg by mouth daily.     Marland Kitchen estradiol (ESTRACE) 0.1 MG/GM vaginal cream Place 1 Applicatorful vaginally every 3 (three) days.     Marland Kitchen estradiol (ESTRACE)  1 MG tablet Take 1 mg by mouth daily.     Marland Kitchen losartan (COZAAR) 25 MG tablet TAKE 1 TABLET BY MOUTH EVERY DAY (Patient taking differently: Take 25 mg by mouth daily. ) 90 tablet 2  . Probiotic CAPS Take 1 capsule by mouth daily.    . promethazine (PHENERGAN) 25 MG tablet Take 25 mg by mouth daily as needed for nausea or vomiting.    . triamcinolone cream (KENALOG) 0.1 % Apply 1 application topically daily as needed. (Patient not taking: Reported on 02/25/2018) 30 g 1    No results found for this or any previous visit (from the past 48 hour(s)). No results found.  Pertinent items noted in HPI and remainder of comprehensive ROS otherwise  negative.  Blood pressure (!) 157/85, pulse 69, temperature 97.8 F (36.6 C), temperature source Oral, resp. rate 20, height 5\' 5"  (1.651 m), weight 106.6 kg, SpO2 99 %.  Patient is awake and alert.  She is oriented and appropriate.  Speech is fluent.  Judgment insight intact.  Cranial nerve function normal bilateral.  Motor examination intact motor and sensory examination nonfocal.  Examination head ears eyes and throat is unremarkable her chest and abdomen are benign.  Extremities are free from injury deformity. Assessment/Plan L4-L5 grade 1 degenerative spondylolisthesis with stenosis.  Plan bilateral L4-5 decompressive laminotomies and foraminotomies followed by posterior lumbar interbody fusion utilizing interbody cages and locally harvested autograft with posterior lateral arthrodesis utilizing nonsegmental pedicle screw fixation and local autografting.  Risks and benefits of been explained.  Patient wishes to proceed.  Sherilyn Cooter A Zalma Channing 03/03/2018, 11:31 AM

## 2018-03-03 NOTE — Progress Notes (Addendum)
Pharmacy Note  [name] is a 57 y.o. female admitted on 03/03/2018 for lumbar decompression and fusion. Pharmacy is consulted to dose vancomycin post-op in setting of patient's allergies.   Separately, patient has many opioid allergies. I reviewed her history, she has not had any opioids in our healthsystem; therefore, I am unable to assess if she has tolerated any in the past. I spoke to the patient, she would prefer not to take morphine, Vicodin nor oxycodone. I also discussed with the on-call provider, we made the decision to place the patient on fentanyl overnight and let the day time team assess which oral analgesics would be appropriate for her. I would consider using Tramadol.   Plan: -Vancomycin 1500 mg IV x1 -Monitor for s/sx infection -F/u analgesia   Height: 5\' 5"  (165.1 cm) Weight: 235 lb (106.6 kg) IBW/kg (Calculated) : 57  Temp (24hrs), Avg:97.4 F (36.3 C), Min:97.2 F (36.2 C), Max:97.8 F (36.6 C)  Recent Labs  Lab 02/27/18 1117  WBC 5.9  CREATININE 0.88      Allergies  Allergen Reactions  . Meperidine Shortness Of Breath  . Morphine Anaphylaxis  . Penicillins Hives, Nausea Only, Swelling and Other (See Comments)    PATIENT HAS HAD A PCN REACTION WITH IMMEDIATE RASH, FACIAL/TONGUE/THROAT SWELLING, SOB, OR LIGHTHEADEDNESS WITH HYPOTENSION:  #  #  YES  #  #  HAS PT DEVELOPED SEVERE RASH INVOLVING MUCUS MEMBRANES or SKIN NECROSIS: #  #  YES  #  #  PATIENT HAS HAD A PCN REACTION THAT REQUIRED HOSPITALIZATION:  #  #  YES  #  #  Has patient had a PCN reaction occurring within the last 10 years: No   . Lisinopril Cough       . Amoxicillin Nausea Only  . Azithromycin Nausea And Vomiting  . Codeine Nausea Only and Other (See Comments)    Hot flashes also  . Hydrocodone Itching, Nausea Only and Rash  . Omeprazole Itching and Rash  . Oxycodone-Acetaminophen Nausea And Vomiting and Rash  . Sucralfate Rash and Other (See Comments)    Blurry vision   .  Tramadol Itching, Nausea Only and Rash      [name] 03/03/2018 6:13 PM

## 2018-03-03 NOTE — Anesthesia Preprocedure Evaluation (Addendum)
Anesthesia Evaluation  Patient identified by MRN, date of birth, ID band Patient awake    Reviewed: Allergy & Precautions, NPO status   History of Anesthesia Complications (+) PONV and Family history of anesthesia reaction  Airway Mallampati: II  TM Distance: >3 FB     Dental  (+) Teeth Intact, Dental Advisory Given   Pulmonary    breath sounds clear to auscultation       Cardiovascular hypertension,  Rhythm:Regular Rate:Normal     Neuro/Psych  Headaches,    GI/Hepatic Neg liver ROS, GERD  ,  Endo/Other  diabetes  Renal/GU      Musculoskeletal  (+) Arthritis ,   Abdominal (+)  Abdomen: soft. Bowel sounds: normal.  Peds  Hematology  (+) anemia ,   Anesthesia Other Findings   Reproductive/Obstetrics                            Anesthesia Physical Anesthesia Plan  ASA: III  Anesthesia Plan: General   Post-op Pain Management:    Induction: Intravenous  PONV Risk Score and Plan: Treatment may vary due to age or medical condition  Airway Management Planned:   Additional Equipment:   Intra-op Plan:   Post-operative Plan: Possible Post-op intubation/ventilation  Informed Consent: I have reviewed the patients History and Physical, chart, labs and discussed the procedure including the risks, benefits and alternatives for the proposed anesthesia with the patient or authorized representative who has indicated his/her understanding and acceptance.   Dental advisory given  Plan Discussed with: Anesthesiologist and CRNA  Anesthesia Plan Comments:         Anesthesia Quick Evaluation

## 2018-03-03 NOTE — Brief Op Note (Signed)
03/03/2018  2:46 PM  PATIENT:  [name]  57 y.o. female  PRE-OPERATIVE DIAGNOSIS:  spondylolisthesis  POST-OPERATIVE DIAGNOSIS:  spondylolisthesis  PROCEDURE:  Procedure(s): POSTERIOR LUMBAR INTERBODY FUSION - LUMBAR FOUR-LUMBAR FIVE (N/A)  SURGEON:  Surgeon(s) and Role:    * Rayane Gallardo, Sherilyn Cooter, MD - Primary    * Lisbeth Renshaw, MD - Assisting  PHYSICIAN ASSISTANT:   ASSISTANTS:    ANESTHESIA:   general  EBL:  200 mL   BLOOD ADMINISTERED:none  DRAINS: none   LOCAL MEDICATIONS USED:  MARCAINE     SPECIMEN:  No Specimen  DISPOSITION OF SPECIMEN:  N/A  COUNTS:  YES  TOURNIQUET:  * No tourniquets in log *  DICTATION: .Dragon Dictation  PLAN OF CARE: Admit to inpatient   PATIENT DISPOSITION:  PACU - hemodynamically stable.   Delay start of Pharmacological VTE agent (>24hrs) due to surgical blood loss or risk of bleeding: yes

## 2018-03-03 NOTE — Anesthesia Procedure Notes (Signed)
Anesthesia Procedure Note     

## 2018-03-03 NOTE — Op Note (Signed)
Date of procedure: 03/03/2018  Date of dictation: Same  Service: Neurosurgery  Preoperative diagnosis: Grade 1 L4-5 degenerative spondylolisthesis with stenosis and neurogenic claudication  Postoperative diagnosis: Same  Procedure Name: Bilateral L4-5 decompressive laminotomies and foraminotomies, more than would be required for simple interbody fusion alone.  L4-5 posterior lumbar interbody fusion utilizing interbody cages and locally harvested autograft  L4-5 posterior lateral arthrodesis utilizing nonsegmental pedicle screw fixation and local autograft  Surgeon:Lamorris Knoblock A.Vergene Marland, M.D.  Asst. Surgeon: Conchita Paris  Anesthesia: General  Indication: 57 year old female with back and bilateral lower extremity symptoms failing conservative management and consistent with neurogenic claudication.  Work-up demonstrates evidence of an unstable grade 1 L4-5 degenerative spondylolisthesis with marked facet arthropathy and severe stenosis.  Patient presents now for decompression and fusion in hopes of improving her symptoms.  Operative note: After induction of anesthesia, patient position prone on the Wilson frame and properly padded.  Lumbar region prepped and draped sterilely.  Incision made overlying L4-5.  Dissection performed bilaterally.  Retractor placed.  Fluoroscopy used.  Level confirmed.  Decompressive laminotomies and facetectomies were then performed bilaterally using Leksell Wonda Cerise and high-speed drill to remove the inferior two thirds of the lamina of L4 the entire inferior facet and pars interarticularis of L4.  The majority the superior facet of L5.  And the superior aspect of the lamina of L5.  Ligament flavum elevated and resected.  Foraminotomies were then completed on the course the exiting L4 and L5 nerve roots.  Bilateral discectomies then performed at L4-5.  The space then prepared for interbody fusion.  Soft tissue removed in the interspace.  Distractor placed the  patient's right side.  Disc space further cleaned and then a 9 mm [employer] expandable cage packed with locally harvested autograft was then impacted in place and expanded to its full extent.  Distractor removed patient's right side.  The space prepared on the right side.  Morselized autograft packed in the interspace.  Second cage packed with autograft was then impacted in place and expanded to its full extent.  Pedicles of L4 and L5 were then identified using surface landmarks and intraoperative fluoroscopy.  Superficial bone overlying the pedicle was then removed using high-speed drill.  Each pedicle was then probed using pedicle all each pedicle all track was probed and found to be solidly within the bone.  Each pedicle all track was then tapped and then probed again.  5.75 mm radius bran screws from [employer] were placed bilaterally at L4 and L5.  Final images reveal good position of the screws and the cages at the proper operative level with normal alignment of the spine.  Transverse processes and residual facets were decorticated.  Morselized autograft was packed posterior laterally.  Short segment of titanium rod placed of the screws at L4 and L5.  Locking caps and posterior of the screws.  Locking caps and engaged with a construct under compression.  Gelfoam was placed over the laminotomy defects.  Vancomycin powder placed in deep wound space.  Wounds and closed in layers with Vicryl sutures.  Steri-Strips and sterile dressing were applied.  No apparent complications.  Patient tolerated the procedure well and she returns to the recovery room postop.

## 2018-03-03 NOTE — Anesthesia Procedure Notes (Signed)
Procedure Name: Intubation Date/Time: 03/03/2018 12:05 PM Performed by: Lavell Luster, CRNA Pre-anesthesia Checklist: Patient identified, Emergency Drugs available, Suction available, Patient being monitored and Timeout performed Patient Re-evaluated:Patient Re-evaluated prior to induction Oxygen Delivery Method: Circle system utilized Preoxygenation: Pre-oxygenation with 100% oxygen Induction Type: IV induction Ventilation: Mask ventilation without difficulty Laryngoscope Size: Mac and 4 Grade View: Grade I Tube type: Oral Tube size: 7.5 mm Number of attempts: 1 Airway Equipment and Method: Stylet Placement Confirmation: ETT inserted through vocal cords under direct vision,  positive ETCO2 and breath sounds checked- equal and bilateral Secured at: 21 cm Tube secured with: Tape Dental Injury: Teeth and Oropharynx as per pre-operative assessment

## 2018-03-04 ENCOUNTER — Telehealth: Payer: Self-pay | Admitting: *Deleted

## 2018-03-04 MED ORDER — CARISOPRODOL 350 MG PO TABS: 350.0000 mg | ORAL_TABLET | Freq: Four times a day (QID) | ORAL | 0 refills | Status: DC | PRN

## 2018-03-04 MED ORDER — OXYCODONE HCL 5 MG PO TABS: 5.0000 mg | ORAL_TABLET | ORAL | 0 refills | Status: AC | PRN

## 2018-03-04 MED ORDER — DOCUSATE SODIUM 100 MG PO CAPS
100.0000 mg | ORAL_CAPSULE | Freq: Two times a day (BID) | ORAL | Status: DC
  Administered 2018-03-04: 100 mg via ORAL
  Filled 2018-03-04: qty 1

## 2018-03-04 MED FILL — Thrombin For Soln Kit 20000 Unit: CUTANEOUS | Qty: 1 | Status: AC

## 2018-03-04 NOTE — Telephone Encounter (Signed)
Pt was on TCM report admitted to the hospital 03/03/18 where she underwent uncomplicated L4-5 decompression and fusion surgery for treatment of her unstable degenerative spondylolisthesis. Pt D/C 03/04/18, and will follow w/surgeon.Marland KitchenRaechel Chute

## 2018-03-04 NOTE — Transfer of Care (Addendum)
Immediate Anesthesia Transfer of Care Note  Patient: [name]  Procedure(s) Performed: POSTERIOR LUMBAR INTERBODY FUSION - LUMBAR FOUR-LUMBAR FIVE (N/A Back)  Patient Location: PACU  Anesthesia Type:General  Level of Consciousness: awake, alert  and oriented  Airway & Oxygen Therapy: Patient Spontanous Breathing  Post-op Assessment: Post -op Vital signs reviewed and stable  Post vital signs: stable  Last Vitals:  Vitals Value Taken Time  BP    Temp    Pulse    Resp    SpO2      Last Pain:  Vitals:   03/04/18 0723  TempSrc: Oral  PainSc:       Patients Stated Pain Goal: 3 (03/04/18 0341)  Complications: No apparent anesthesia complications

## 2018-03-04 NOTE — Evaluation (Signed)
Occupational Therapy Evaluation and Discharge Patient Details Name: [name] MRN: [id number] DOB: 1961/02/21 Today's Date: 03/04/2018    History of Present Illness Pt is a 57 y.o. female s/p PLIF @ L4-5. PMH significant for anemia, HTN, and migraines.     Clinical Impression   PTA Pt independent in ADL and mobility. Back handout provided and reviewed adls in detail. Pt educated on: clothing between brace, never sleep in brace, set an alarm at night for medication, avoid sitting for long periods of time, correct bed positioning for sleeping, correct sequence for bed mobility, avoiding lifting more than 5 pounds and never wash directly over incision. All education is complete and patient indicates understanding. Thank you for the opportunity to serve this patient.     Follow Up Recommendations  Supervision - Intermittent    Equipment Recommendations  None recommended by OT    Recommendations for Other Services       Precautions / Restrictions Precautions Precautions: Back Precaution Booklet Issued: Yes (comment) Precaution Comments: Precautions reviewed in full with pt  Required Braces or Orthoses: Spinal Brace Spinal Brace: Lumbar corset;Applied in sitting position Restrictions Weight Bearing Restrictions: No      Mobility Bed Mobility Overal bed mobility: Needs Assistance Bed Mobility: Rolling;Sidelying to Sit Rolling: Supervision Sidelying to sit: Supervision       General bed mobility comments: recieved standing in the hallway with PT and returned to recliner  Transfers Overall transfer level: Needs assistance Equipment used: None Transfers: Sit to/from Stand Sit to Stand: Supervision         General transfer comment: Pt supervision for safety. Did not require HHA in order to stand. Demonstrated proper hand placement and technique.     Balance Overall balance assessment: Mild deficits observed, not formally tested                                          ADL either performed or assessed with clinical judgement   ADL Overall ADL's : Needs assistance/impaired Eating/Feeding: Independent   Grooming: Modified independent;Oral care;Wash/dry face;Wash/dry hands;Brushing hair;Standing;Cueing for compensatory techniques Grooming Details (indicate cue type and reason): educated on cup method, setting up on left to prevent twisting, and other compensatory startegies to maintain back precautions Upper Body Bathing: Minimal assistance;With caregiver independent assisting Upper Body Bathing Details (indicate cue type and reason): educated on long handle sponge Lower Body Bathing: Moderate assistance;With caregiver independent assisting;With adaptive equipment(standing) Lower Body Bathing Details (indicate cue type and reason): educated on long handle sponge Upper Body Dressing : Set up Upper Body Dressing Details (indicate cue type and reason): able to don/doff brace without assist Lower Body Dressing: Maximal assistance;Sit to/from stand Lower Body Dressing Details (indicate cue type and reason): unable to access feet at this time without assist - states that her son or husband will be able to assist. Educated on reacher - Pt declined further AE education/demo Toilet Transfer: Supervision/safety;Ambulation;Grab bars;Comfort height toilet   Toileting- Clothing Manipulation and Hygiene: Supervision/safety;Adhering to back precautions   Tub/ Shower Transfer: Supervision/safety   Functional mobility during ADLs: Supervision/safety General ADL Comments: educated on compensatory strategies for ADL maintaining back precautions     Vision Patient Visual Report: No change from baseline       Perception     Praxis      Pertinent Vitals/Pain Pain Assessment: 0-10 Pain Score: 3  Pain Location: incision Pain  Descriptors / Indicators: Sore;Operative site guarding;Grimacing Pain Intervention(s): Limited activity within patient's  tolerance;Monitored during session;Repositioned     Hand Dominance Left   Extremity/Trunk Assessment Upper Extremity Assessment Upper Extremity Assessment: Overall WFL for tasks assessed   Lower Extremity Assessment Lower Extremity Assessment: Defer to PT evaluation   Cervical / Trunk Assessment Cervical / Trunk Assessment: Other exceptions Cervical / Trunk Exceptions: s/p lumbar surgery   Communication Communication Communication: No difficulties   Cognition Arousal/Alertness: Awake/alert Behavior During Therapy: WFL for tasks assessed/performed Overall Cognitive Status: Within Functional Limits for tasks assessed                                     General Comments  Pt does worry about her adult (32) son who has epilepsy. She shared that when he has seizures that she often has to carry him or physically assist him. Educated Pt that if something like that happens she will need to call EMS or for assist and that to protect her back she needs to maintain her back precautions. Pt verbalized understanding    Exercises     Shoulder Instructions      Home Living Family/patient expects to be discharged to:: Private residence Living Arrangements: Spouse/significant other;Children(son has epilepsy) Available Help at Discharge: Family;Available 24 hours/day Type of Home: House Home Access: Stairs to enter [employer] of Steps: 1 Entrance Stairs-Rails: None Home Layout: One level     Bathroom Shower/Tub: Chief Strategy Officer: Standard     Home Equipment: Environmental consultant - 4 wheels;Crutches;Adaptive equipment Adaptive Equipment: Reacher Additional Comments: Husband is retired and available for help 24 hours      Prior Functioning/Environment Level of Independence: Independent                 OT Problem List: Decreased range of motion;Decreased activity tolerance;Decreased knowledge of use of DME or AE;Decreased knowledge of  precautions;Obesity;Pain      OT Treatment/Interventions:      OT Goals(Current goals can be found in the care plan section) Acute Rehab OT Goals Patient Stated Goal: to go home today OT Goal Formulation: With patient Time For Goal Achievement: 03/18/18 Potential to Achieve Goals: Good  OT Frequency:     Barriers to D/C:            Co-evaluation              AM-PAC PT "6 Clicks" Daily Activity     Outcome Measure Help from another person eating meals?: None Help from another person taking care of personal grooming?: None Help from another person toileting, which includes using toliet, bedpan, or urinal?: None Help from another person bathing (including washing, rinsing, drying)?: A Lot Help from another person to put on and taking off regular upper body clothing?: None Help from another person to put on and taking off regular lower body clothing?: A Lot 6 Click Score: 20   End of Session Equipment Utilized During Treatment: Gait belt;Back brace Nurse Communication: Mobility status  Activity Tolerance: Patient tolerated treatment well Patient left: in chair;with call bell/phone within reach  OT Visit Diagnosis: Other abnormalities of gait and mobility (R26.89);Pain Pain - part of body: (back)                Time: 1115-5208 OT Time Calculation (min): 20 min Charges:  OT General Charges $OT Visit: 1 Visit OT Evaluation $OT Eval Low Complexity:  1 Low  Sherryl Manges OTR/L Acute Rehabilitation Services Pager: [phone] Office: [phone]  Evern Bio Kallan Bischoff 03/04/2018, 10:23 AM

## 2018-03-04 NOTE — Discharge Summary (Signed)
Physician Discharge Summary  Patient ID: [name] MRN: [id number] DOB/AGE: 1961-04-09 57 y.o.  Admit date: 03/03/2018 Discharge date: 03/04/2018  Admission Diagnoses: General spondylolisthesis  Discharge Diagnoses:  Active Problems:   Degenerative spondylolisthesis   Discharged Condition: good  Hospital Course: Patient admitted to the hospital where she underwent uncomplicated L4-5 decompression and fusion surgery for treatment of her unstable degenerative spondylolisthesis.  Postoperatively doing very well.  Preoperative back and lower extremity symptoms much improved.  Standing and ambulating without difficulty.  Ready for discharge home.  Consults:   Significant Diagnostic Studies:   Treatments:   Discharge Exam: Blood pressure (!) 117/51, pulse (!) 53, temperature 97.8 F (36.6 C), temperature source Oral, resp. rate 18, height 5\' 5"  (1.651 m), weight 106.6 kg, SpO2 97 %. Awake and alert.  Oriented and appropriate.  Cranial nerve function intact.  Motor and sensory function extremities normal.  Wound clean and dry.  Chest and abdomen benign.  Disposition: Discharge disposition: 01-Home or Self Care        Allergies as of 03/04/2018      Reactions   Meperidine Shortness Of Breath   Morphine Anaphylaxis   Penicillins Hives, Nausea Only, Swelling, Other (See Comments)   PATIENT HAS HAD A PCN REACTION WITH IMMEDIATE RASH, FACIAL/TONGUE/THROAT SWELLING, SOB, OR LIGHTHEADEDNESS WITH HYPOTENSION:  #  #  YES  #  #  HAS PT DEVELOPED SEVERE RASH INVOLVING MUCUS MEMBRANES or SKIN NECROSIS: #  #  YES  #  #  PATIENT HAS HAD A PCN REACTION THAT REQUIRED HOSPITALIZATION:  #  #  YES  #  #  Has patient had a PCN reaction occurring within the last 10 years: No   Lisinopril Cough       Amoxicillin Nausea Only   Azithromycin Nausea And Vomiting   Codeine Nausea Only, Other (See Comments)   Hot flashes also   Hydrocodone Itching, Nausea Only, Rash   Omeprazole Itching, Rash   Oxycodone-acetaminophen Nausea And Vomiting, Rash   Sucralfate Rash, Other (See Comments)   Blurry vision    Tramadol Itching, Nausea Only, Rash      Medication List    TAKE these medications   acetaminophen 500 MG tablet Commonly known as:  TYLENOL Take 1,000 mg by mouth daily as needed for moderate pain or headache.   carisoprodol 350 MG tablet Commonly known as:  SOMA Take 1 tablet (350 mg total) by mouth 4 (four) times daily as needed for muscle spasms.   esomeprazole 20 MG capsule Commonly known as:  NEXIUM Take 20 mg by mouth daily.   estradiol 0.1 MG/GM vaginal cream Commonly known as:  ESTRACE Place 1 Applicatorful vaginally every 3 (three) days.   estradiol 1 MG tablet Commonly known as:  ESTRACE Take 1 mg by mouth daily.   losartan 25 MG tablet Commonly known as:  COZAAR TAKE 1 TABLET BY MOUTH EVERY DAY   oxyCODONE 5 MG immediate release tablet Commonly known as:  Oxy IR/ROXICODONE Take 1-2 tablets (5-10 mg total) by mouth every 4 (four) hours as needed for severe pain.   Probiotic Caps Take 1 capsule by mouth daily.   promethazine 25 MG tablet Commonly known as:  PHENERGAN Take 25 mg by mouth daily as needed for nausea or vomiting.   triamcinolone cream 0.1 % Commonly known as:  KENALOG Apply 1 application topically daily as needed.            Durable Medical Equipment  (From admission, onward)  Start     Ordered   03/03/18 1636  DME Walker rolling  Once    Question:  Patient needs a walker to treat with the following condition  Answer:  Degenerative spondylolisthesis   03/03/18 1635   03/03/18 1636  DME 3 n 1  Once     03/03/18 1635           Signed: Sherilyn Cooter A Samyia Motter 03/04/2018, 8:30 AM

## 2018-03-04 NOTE — Progress Notes (Signed)
Physical Therapy Evaluation Patient Details Name: [name] MRN: [id number] DOB: 01-Mar-1961 Today's Date: 03/04/2018   History of Present Illness  Pt is a 57 y.o. female s/p PLIF @ L4-5. PMH significant for anemia, HTN, and migraines.    Clinical Impression  Patient is s/p above surgery resulting in deficits listed below (see PT Problem List). At time of evaluation pt performed ambulation with gross min G. Pt educated on generalized walking program, car transfers, and precautions. Pt reports improvements in symptoms, but still demonstrating deficits. Will continue to follow pt acutely while admitted to increase their independence and safety with mobility to allow discharge to the venue listed below.       Follow Up Recommendations No PT follow up;Supervision for mobility/OOB    Equipment Recommendations  None recommended by PT    Recommendations for Other Services       Precautions / Restrictions Precautions Precautions: Back Precaution Booklet Issued: Yes (comment) Precaution Comments: Precautions reviewed in full with pt  Required Braces or Orthoses: Spinal Brace Spinal Brace: Lumbar corset;Applied in sitting position Restrictions Weight Bearing Restrictions: No      Mobility  Bed Mobility Overal bed mobility: Needs Assistance Bed Mobility: Rolling;Sidelying to Sit Rolling: Supervision Sidelying to sit: Supervision       General bed mobility comments: Supervision for safety. Min cues for log roll sequence. Increased time requried secondary to pain.   Transfers Overall transfer level: Needs assistance Equipment used: None Transfers: Sit to/from Stand Sit to Stand: Supervision         General transfer comment: Pt supervision for safety. Did not require HHA in order to stand. Demonstrated proper hand placement and technique.   Ambulation/Gait Ambulation/Gait assistance: Min guard Gait Distance (Feet): 350 Feet Assistive device: None Gait  Pattern/deviations: Step-through pattern;Trunk flexed;Wide base of support Gait velocity: decreased Gait velocity interpretation: <1.31 ft/sec, indicative of household ambulator General Gait Details: Min G throughout as pt reported her LLE gave out once while ambulating in AM with RN. Pt required min cues for upright posture throughout. Able to ambulate without HHA at time of session without any LOB or knee buckling.   Stairs Stairs: Yes Stairs assistance: Min assist Stair Management: No rails;Step to pattern;Forwards;Backwards Number of Stairs: 2 General stair comments: HHA+1 utilized to negotiate step. Ascended forwards with stronger LE leading and descended backwards with weaker LE leading. Min cues for technique   Wheelchair Mobility    Modified Rankin (Stroke Patients Only)       Balance Overall balance assessment: Mild deficits observed, not formally tested                                           Pertinent Vitals/Pain Pain Assessment: 0-10 Pain Score: 3  Pain Location: incision Pain Descriptors / Indicators: Sore;Operative site guarding;Grimacing Pain Intervention(s): Limited activity within patient's tolerance;Monitored during session    Home Living Family/patient expects to be discharged to:: Private residence Living Arrangements: Spouse/significant other;Children Available Help at Discharge: Family;Available 24 hours/day Type of Home: House Home Access: Stairs to enter Entrance Stairs-Rails: None Entrance Stairs-Number of Steps: 1 Home Layout: One level Home Equipment: Walker - 4 wheels;Crutches Additional Comments: Husband is retired and available for help 24 hours    Prior Function Level of Independence: Independent               Hand Dominance  Extremity/Trunk Assessment   Upper Extremity Assessment Upper Extremity Assessment: Defer to OT evaluation    Lower Extremity Assessment Lower Extremity Assessment: Overall  WFL for tasks assessed    Cervical / Trunk Assessment Cervical / Trunk Assessment: Other exceptions Cervical / Trunk Exceptions: s/p lumbar surgery  Communication   Communication: No difficulties  Cognition Arousal/Alertness: Awake/alert Behavior During Therapy: WFL for tasks assessed/performed Overall Cognitive Status: Within Functional Limits for tasks assessed                                        General Comments      Exercises     Assessment/Plan    PT Assessment Patient needs continued PT services  PT Problem List Decreased range of motion;Decreased strength;Decreased activity tolerance;Decreased mobility;Pain       PT Treatment Interventions Gait training;Stair training;Functional mobility training;Therapeutic activities;Patient/family education;Modalities    PT Goals (Current goals can be found in the Care Plan section)  Acute Rehab PT Goals Patient Stated Goal: to go home today PT Goal Formulation: With patient Time For Goal Achievement: 03/11/18 Potential to Achieve Goals: Good    Frequency Min 5X/week   Barriers to discharge        Co-evaluation               AM-PAC PT "6 Clicks" Daily Activity  Outcome Measure Difficulty turning over in bed (including adjusting bedclothes, sheets and blankets)?: A Little Difficulty moving from lying on back to sitting on the side of the bed? : A Little Difficulty sitting down on and standing up from a chair with arms (e.g., wheelchair, bedside commode, etc,.)?: A Little Help needed moving to and from a bed to chair (including a wheelchair)?: A Little Help needed walking in hospital room?: A Little Help needed climbing 3-5 steps with a railing? : A Little 6 Click Score: 18    End of Session Equipment Utilized During Treatment: Gait belt;Back brace Activity Tolerance: Patient tolerated treatment well Patient left: Other (comment)(in bathroom with OT taking over session) Nurse Communication:  Mobility status PT Visit Diagnosis: Other abnormalities of gait and mobility (R26.89);Muscle weakness (generalized) (M62.81);Pain Pain - part of body: (back)    Time: 3545-6256 PT Time Calculation (min) (ACUTE ONLY): 18 min   Charges:    1 Mod Eval          Donzetta Kohut, [place]  Student Physical Therapist Acute Rehab [phone]   Donzetta Kohut 03/04/2018, 9:58 AM

## 2018-03-04 NOTE — Progress Notes (Signed)
Patient alert and oriented, mae's well, voiding adequate amount of urine, swallowing without difficulty, no c/o pain at time of discharge. Patient discharged home with family. Script and discharged instructions given to patient. Patient and family stated understanding of instructions given. Patient has an appointment with Dr. Pool  

## 2018-03-04 NOTE — Discharge Instructions (Signed)

## 2018-03-05 MED FILL — Heparin Sodium (Porcine) Inj 1000 Unit/ML: INTRAMUSCULAR | Qty: 30 | Status: AC

## 2018-03-05 MED FILL — Sodium Chloride IV Soln 0.9%: INTRAVENOUS | Qty: 1000 | Status: AC

## 2018-03-06 ENCOUNTER — Encounter: Payer: 59 | Admitting: Internal Medicine

## 2018-03-06 NOTE — Anesthesia Postprocedure Evaluation (Signed)
Anesthesia Post Note  Patient: [name]  Procedure(s) Performed: POSTERIOR LUMBAR INTERBODY FUSION - LUMBAR FOUR-LUMBAR FIVE (N/A Back)     Patient location during evaluation: PACU Anesthesia Type: General Level of consciousness: awake Pain management: pain level controlled Vital Signs Assessment: post-procedure vital signs reviewed and stable Respiratory status: spontaneous breathing Cardiovascular status: stable Postop Assessment: no apparent nausea or vomiting Anesthetic complications: no    Last Vitals:  Vitals:   03/04/18 0400 03/04/18 0723  BP: 129/62 (!) 117/51  Pulse: 70 (!) 53  Resp: 18 18  Temp: 36.4 C 36.6 C  SpO2: 96% 97%    Last Pain:  Vitals:   03/04/18 1057  TempSrc:   PainSc: 1                  Marijane Trower

## 2018-04-07 DIAGNOSIS — M4310 Spondylolisthesis, site unspecified: Secondary | ICD-10-CM | POA: Diagnosis not present

## 2018-04-09 ENCOUNTER — Ambulatory Visit (INDEPENDENT_AMBULATORY_CARE_PROVIDER_SITE_OTHER): Payer: 59

## 2018-04-09 DIAGNOSIS — Z23 Encounter for immunization: Secondary | ICD-10-CM | POA: Diagnosis not present

## 2018-04-28 ENCOUNTER — Encounter: Payer: Self-pay | Admitting: Sports Medicine

## 2018-04-28 ENCOUNTER — Ambulatory Visit: Payer: 59 | Admitting: Sports Medicine

## 2018-04-28 VITALS — BP 150/78 | Ht 65.0 in | Wt 235.0 lb

## 2018-04-28 DIAGNOSIS — M25561 Pain in right knee: Secondary | ICD-10-CM | POA: Diagnosis not present

## 2018-04-28 MED ORDER — METHYLPREDNISOLONE ACETATE 40 MG/ML IJ SUSP
40.0000 mg | Freq: Once | INTRAMUSCULAR | Status: AC
  Administered 2018-04-28: 40 mg via INTRA_ARTICULAR

## 2018-04-29 ENCOUNTER — Encounter: Payer: Self-pay | Admitting: Sports Medicine

## 2018-04-29 NOTE — Progress Notes (Signed)
   Subjective:    Patient ID: [name], female    DOB: 1960-06-26, 57 y.o.   MRN: [id number]  HPI chief complaint: Right knee pain  [name] comes in today complaining of right knee pain.  She has had problems with both knees in the past.  She has a history of bilateral knee DJD.  She had cortisone injections in the past which have helped.  She injured the right knee Sunday by twisting her knee.  Immediate pain.  Mild swelling afterwards.  She localizes most of her pain along the lateral joint line.  Some radiating pain proximally and distally.  She does have pain with standing but most of her pain is with twisting.  She is status post lumbar decompression and spinal fusion done by Dr. Dutch Quint.  She is doing relatively well postoperatively.  Interim medical history reviewed Medications reviewed Allergies reviewed    Review of Systems As above    Objective:   Physical Exam  Well-developed, well-nourished.  No acute distress.  Awake alert and oriented x3.  Vital signs reviewed. Patient is wearing a lumbar support.  She is status post spinal surgery.  Right knee: Range of motion is 0 to 100 degrees.  Trace effusion.  She is tender to palpation along the medial joint line.  No tenderness along the lateral joint line.  Negative McMurray's.  Knee is grossly stable to ligamentous exam.  Neurovascularly intact distally.  Walking with a limp.      Assessment & Plan:   Right knee pain secondary to DJD Status post lumbar decompression and spinal fusion  Patient's right knee is injected with cortisone today.  This was done after risks and benefits were explained.  Patient tolerated this without difficulty.  An anterior medial approach was utilized.  If symptoms persist consider updated x-rays to reevaluate degree of osteoarthritis prior to determining further work-up and treatment.  She will follow-up with Dr. Dutch Quint as scheduled for her lumbar spine and will follow up with me as  needed.  Consent obtained and verified. Time-out conducted. Noted no overlying erythema, induration, or other signs of local infection. Skin prepped in a sterile fashion. Topical analgesic spray: Ethyl chloride. Joint: right knee Needle: 25g 1.5 inch Completed without difficulty. Meds: 3cc 1% xylocaine, 1cc (40mg ) depomedrol  Advised to call if fevers/chills, erythema, induration, drainage, or persistent bleeding.

## 2018-05-13 DIAGNOSIS — M4310 Spondylolisthesis, site unspecified: Secondary | ICD-10-CM | POA: Diagnosis not present

## 2018-05-20 ENCOUNTER — Encounter: Payer: Self-pay | Admitting: Internal Medicine

## 2018-05-20 ENCOUNTER — Other Ambulatory Visit (INDEPENDENT_AMBULATORY_CARE_PROVIDER_SITE_OTHER): Payer: 59

## 2018-05-20 ENCOUNTER — Encounter

## 2018-05-20 ENCOUNTER — Ambulatory Visit (INDEPENDENT_AMBULATORY_CARE_PROVIDER_SITE_OTHER): Payer: 59 | Admitting: Internal Medicine

## 2018-05-20 VITALS — BP 138/90 | HR 91 | Temp 97.9°F | Ht 65.0 in | Wt 240.0 lb

## 2018-05-20 DIAGNOSIS — Z114 Encounter for screening for human immunodeficiency virus [HIV]: Secondary | ICD-10-CM | POA: Diagnosis not present

## 2018-05-20 DIAGNOSIS — H538 Other visual disturbances: Secondary | ICD-10-CM | POA: Insufficient documentation

## 2018-05-20 DIAGNOSIS — R7302 Impaired glucose tolerance (oral): Secondary | ICD-10-CM

## 2018-05-20 DIAGNOSIS — R739 Hyperglycemia, unspecified: Secondary | ICD-10-CM | POA: Diagnosis not present

## 2018-05-20 DIAGNOSIS — J019 Acute sinusitis, unspecified: Secondary | ICD-10-CM

## 2018-05-20 DIAGNOSIS — I10 Essential (primary) hypertension: Secondary | ICD-10-CM

## 2018-05-20 DIAGNOSIS — J309 Allergic rhinitis, unspecified: Secondary | ICD-10-CM | POA: Diagnosis not present

## 2018-05-20 DIAGNOSIS — Z0001 Encounter for general adult medical examination with abnormal findings: Secondary | ICD-10-CM

## 2018-05-20 LAB — BASIC METABOLIC PANEL
BUN: 10 mg/dL (ref 6–23)
CALCIUM: 9.6 mg/dL (ref 8.4–10.5)
CO2: 24 mEq/L (ref 19–32)
CREATININE: 0.75 mg/dL (ref 0.40–1.20)
Chloride: 103 mEq/L (ref 96–112)
GFR: 84.17 mL/min (ref >60.00)
Glucose, Bld: 181 mg/dL — ABNORMAL HIGH (ref 70–99)
Potassium: 3.6 mEq/L (ref 3.5–5.1)
SODIUM: 137 meq/L (ref 135–145)

## 2018-05-20 LAB — LIPID PANEL
CHOLESTEROL: 181 mg/dL (ref 0–200)
HDL: 46.9 mg/dL (ref >39.00)
Total CHOL/HDL Ratio: 4

## 2018-05-20 LAB — HEPATIC FUNCTION PANEL
ALBUMIN: 4.2 g/dL (ref 3.5–5.2)
ALK PHOS: 63 U/L (ref 39–117)
ALT: 15 U/L (ref 0–35)
AST: 15 U/L (ref 0–37)
Bilirubin, Direct: 0 mg/dL (ref 0.0–0.3)
TOTAL PROTEIN: 7.4 g/dL (ref 6.0–8.3)
Total Bilirubin: 0.3 mg/dL (ref 0.2–1.2)

## 2018-05-20 LAB — LDL CHOLESTEROL, DIRECT: Direct LDL: 101 mg/dL

## 2018-05-20 LAB — HEMOGLOBIN A1C: Hgb A1c MFr Bld: 6.6 % — ABNORMAL HIGH (ref 4.6–6.5)

## 2018-05-20 LAB — TSH: TSH: 1.44 u[IU]/mL (ref 0.35–4.50)

## 2018-05-20 MED ORDER — TRIAMCINOLONE ACETONIDE 55 MCG/ACT NA AERO: 2.0000 | INHALATION_SPRAY | Freq: Every day | NASAL | 12 refills | Status: DC

## 2018-05-20 MED ORDER — AZITHROMYCIN 250 MG PO TABS: ORAL_TABLET | ORAL | 1 refills | Status: DC

## 2018-05-20 NOTE — Assessment & Plan Note (Signed)
stable overall by history and exam, recent data reviewed with pt, and pt to continue medical treatment as before,  to f/u any worsening symptoms or concerns  

## 2018-05-20 NOTE — Assessment & Plan Note (Addendum)
Mild to mod, for antibx course,  to f/u any worsening symptoms or concerns  In addition to the time spent performing CPE, I spent an additional 25 minutes face to face,in which greater than 50% of this time was spent in counseling and coordination of care for patient's acute illness as documented, including the differential dx, treatment, further evaluation and other management of acute sinus infection, allergic rhinitis, hyperglycemia, HTN, blurred vision

## 2018-05-20 NOTE — Patient Instructions (Addendum)
You will be contacted regarding the referral for: eye doctor  Please take all new medication as prescribed - the antibiotic, and nasacort  You can also take Mucinex (or it's generic off brand) for congestion, and tylenol as needed for pain.  If you are not improved in 1 week, please call for referral back to ENT  Please continue all other medications as before, and refills have been done if requested.  Please have the pharmacy call with any other refills you may need.  Please continue your efforts at being more active, low cholesterol diet, and weight control.  You are otherwise up to date with prevention measures today.  Please keep your appointments with your specialists as you may have planned  Please go to the LAB in the Basement (turn left off the elevator) for the tests to be done today  You will be contacted by phone if any changes need to be made immediately.  Otherwise, you will receive a letter about your results with an explanation, but please check with MyChart first.  Please remember to sign up for MyChart if you have not done so, as this will be important to you in the future with finding out test results, communicating by private email, and scheduling acute appointments online when needed.  Please return in 1 year for your yearly visit, or sooner if needed, with Lab testing done 3-5 days before

## 2018-05-20 NOTE — Assessment & Plan Note (Signed)
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
## 2018-05-20 NOTE — Assessment & Plan Note (Signed)
For opthi referral

## 2018-05-22 LAB — CBC WITH DIFFERENTIAL/PLATELET
BASOS ABS: 0.1 10*3/uL (ref 0.0–0.1)
Basophils Relative: 1.4 % (ref 0.0–3.0)
EOS PCT: 4.7 % (ref 0.0–5.0)
Eosinophils Absolute: 0.3 10*3/uL (ref 0.0–0.7)
HEMATOCRIT: 40.5 % (ref 36.0–46.0)
Hemoglobin: 13.4 g/dL (ref 12.0–15.0)
LYMPHS ABS: 2.5 10*3/uL (ref 0.7–4.0)
Lymphocytes Relative: 43 % (ref 12.0–46.0)
MCHC: 33.1 g/dL (ref 30.0–36.0)
MCV: 85.7 fl (ref 78.0–100.0)
MONOS PCT: 6.6 % (ref 3.0–12.0)
Monocytes Absolute: 0.4 10*3/uL (ref 0.1–1.0)
NEUTROS ABS: 2.6 10*3/uL (ref 1.4–7.7)
NEUTROS PCT: 44.3 % (ref 43.0–77.0)
PLATELETS: 243 10*3/uL (ref 150.0–400.0)
RBC: 4.73 Mil/uL (ref 3.87–5.11)
RDW: 13.9 % (ref 11.5–15.5)
WBC: 5.8 10*3/uL (ref 4.0–10.5)

## 2018-05-22 LAB — HIV ANTIBODY (ROUTINE TESTING W REFLEX): HIV 1&2 Ab, 4th Generation: NONREACTIVE

## 2018-05-29 ENCOUNTER — Telehealth: Payer: Self-pay

## 2018-05-29 DIAGNOSIS — E559 Vitamin D deficiency, unspecified: Secondary | ICD-10-CM

## 2018-05-29 NOTE — Addendum Note (Signed)
Addended by: [name], Brand Siever W on: 05/29/2018 12:45 PM   Modules accepted: Orders

## 2018-05-29 NOTE — Telephone Encounter (Signed)
Called pt, no VM came on to leave a msg.   Copied from CRM [id number]. Topic: General - Other >> May 29, 2018  8:20 AM Percival SpanishKennedy, Cheryl W wrote:  Pt would like a call back about her lab results she said she does not use mychart

## 2018-05-29 NOTE — Telephone Encounter (Signed)
Pt called back and was given lab results. She stated that a vit D was suppose to be drawn. I do not see where that was done. Please advise.

## 2018-05-29 NOTE — Telephone Encounter (Signed)
Not sure why this was not done, I have added the order, so she can come to the LAB only at her convenience to have this done, or wait to next app

## 2018-06-11 DIAGNOSIS — M722 Plantar fascial fibromatosis: Secondary | ICD-10-CM | POA: Diagnosis not present

## 2018-06-11 DIAGNOSIS — M84375A Stress fracture, left foot, initial encounter for fracture: Secondary | ICD-10-CM | POA: Diagnosis not present

## 2018-06-18 DIAGNOSIS — M84375D Stress fracture, left foot, subsequent encounter for fracture with routine healing: Secondary | ICD-10-CM | POA: Diagnosis not present

## 2018-06-18 DIAGNOSIS — M722 Plantar fascial fibromatosis: Secondary | ICD-10-CM | POA: Diagnosis not present

## 2018-06-26 ENCOUNTER — Encounter: Payer: Self-pay | Admitting: Sports Medicine

## 2018-06-26 ENCOUNTER — Ambulatory Visit: Payer: 59 | Admitting: Sports Medicine

## 2018-06-26 ENCOUNTER — Ambulatory Visit
Admission: RE | Admit: 2018-06-26 | Discharge: 2018-06-26 | Disposition: A | Discharge status: Home / Self Care | Payer: 59 | Source: Ambulatory Visit | Attending: Sports Medicine | Admitting: Sports Medicine

## 2018-06-26 ENCOUNTER — Other Ambulatory Visit: Payer: Self-pay

## 2018-06-26 VITALS — BP 132/84 | Ht 65.0 in | Wt 230.0 lb

## 2018-06-26 DIAGNOSIS — M1711 Unilateral primary osteoarthritis, right knee: Secondary | ICD-10-CM | POA: Diagnosis not present

## 2018-06-26 DIAGNOSIS — M25561 Pain in right knee: Principal | ICD-10-CM

## 2018-06-26 DIAGNOSIS — G8929 Other chronic pain: Secondary | ICD-10-CM

## 2018-06-26 DIAGNOSIS — M170 Bilateral primary osteoarthritis of knee: Secondary | ICD-10-CM | POA: Diagnosis not present

## 2018-06-26 MED ORDER — METHYLPREDNISOLONE ACETATE 40 MG/ML IJ SUSP
40.0000 mg | Freq: Once | INTRAMUSCULAR | Status: AC
  Administered 2018-06-26: 40 mg via INTRA_ARTICULAR

## 2018-06-26 NOTE — Progress Notes (Signed)
   Subjective:    Patient ID: Stacey BombardSylvia B [name], female    DOB: 12/23/1960, 58 y.o.   MRN: [id number]  HPI chief complaint: Bilateral knee pain  58 year old female with known bilateral knee DJD comes in today requesting repeat cortisone injections into each knee.  Right knee was most recently injected in November.  Her pain is started to return.  Pain is identical in nature to what she is experienced with her arthritic pain in the past.  No recent trauma.  She does note some decreased range of motion.  Some swelling as well.  No mechanical symptoms. She continues to do well status post lumbar spine surgery.  She is almost 4 months out from surgery.    Review of Systems As above    Objective:   Physical Exam  Well-developed, well-nourished.  No acute distress.  Awake alert and oriented x3.  Vital signs reviewed  Right knee: Patient has a 2 to 3 degree extension lag.  Flexion is to 100 degrees.  1+ boggy synovitis.  She is tender to palpation along the medial joint line but a negative McMurray's.  No tenderness laterally.  Knee is stable to ligamentous exam.  Neurovascularly intact distally.  Left knee: Range of motion 0 to 100 degrees.  1+ boggy synovitis.  Tender to palpation along the medial joint line.  Negative McMurray's.  No tenderness along the lateral joint line.  Knee is stable ligamentous exam.  Neurovascular intact distally.  Patient walks with an obvious limp.  X-rays of the right knee including AP, lateral, and sunrise views shows bone-on-bone medial compartmental DJD.  X-rays of the left knee done in April of last year shows approaching bone-on-bone medial compartmental DJD.      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  This is done using an anterior medial approach after risks and benefits were explained.  Patient tolerated this without difficulty.  I would like to order her a new  medial unloader brace for the right knee.  She has one currently but it is 59 years old and quite worn.  She understands that definitive treatment is a total knee arthroplasty.  She will need to be completely cleared from her recent spine surgery by Dr. Dutch QuintPoole prior to that.  She understands the cortisone injections may quickly lose their effectiveness.  I did discuss the possibility of Visco supplementation if that is the case and she is still reluctant to have surgery.  Follow-up as needed.  Consent obtained and verified. Time-out conducted. Noted no overlying erythema, induration, or other signs of local infection. Skin prepped in a sterile fashion. Topical analgesic spray: Ethyl chloride. Joint: left knee Needle: 25g 1.5 inch Completed without difficulty. Meds: 3cc 1% xylocaine, 1cc (40mg ) depomedrol  Advised to call if fevers/chills, erythema, induration, drainage, or persistent bleeding.   Consent obtained and verified. Time-out conducted. Noted no overlying erythema, induration, or other signs of local infection. Skin prepped in a sterile fashion. Topical analgesic spray: Ethyl chloride. Joint: right knee Needle: 25g 1.5 inch Completed without difficulty. Meds: 3cc 1% xylocaine  Advised to call if fevers/chills, erythema, induration, drainage, or persistent bleeding.

## 2018-07-17 DIAGNOSIS — H01022 Squamous blepharitis right lower eyelid: Secondary | ICD-10-CM | POA: Diagnosis not present

## 2018-07-17 DIAGNOSIS — H10413 Chronic giant papillary conjunctivitis, bilateral: Secondary | ICD-10-CM | POA: Diagnosis not present

## 2018-07-17 DIAGNOSIS — H01021 Squamous blepharitis right upper eyelid: Secondary | ICD-10-CM | POA: Diagnosis not present

## 2018-07-22 DIAGNOSIS — M4310 Spondylolisthesis, site unspecified: Secondary | ICD-10-CM | POA: Diagnosis not present

## 2018-08-13 ENCOUNTER — Other Ambulatory Visit: Payer: Self-pay | Admitting: Cardiovascular Disease

## 2018-08-27 ENCOUNTER — Telehealth: Payer: Self-pay | Admitting: Sports Medicine

## 2018-08-27 NOTE — Telephone Encounter (Signed)
  I spoke with [name] on the phone this morning after receiving notification from her insurance company that her request for a custom medial unloader brace had been denied.  Reason for denial was that I had not documented the fact that she has tried an off-the-shelf knee brace recently.  In speaking with Deija, she informs me that at the time of her last office visit (January 3 of this year), she was wearing a knee brace/support that she had purchased on her own because her old medial unloader brace was ill fitting.  That most recent brace actually caused her knee to hurt worse and provided no support for her knee instability.  I explained to Clayre that I will document this in her chart and will resubmit the request for her medial unloader brace.  She has had excellent results with custom bracing in the past and I am optimistic that this will once again be the case.

## 2018-09-02 DIAGNOSIS — M1711 Unilateral primary osteoarthritis, right knee: Secondary | ICD-10-CM | POA: Diagnosis not present

## 2018-09-16 DIAGNOSIS — L309 Dermatitis, unspecified: Secondary | ICD-10-CM | POA: Diagnosis not present

## 2018-09-16 DIAGNOSIS — N952 Postmenopausal atrophic vaginitis: Secondary | ICD-10-CM | POA: Diagnosis not present

## 2018-09-16 DIAGNOSIS — N760 Acute vaginitis: Secondary | ICD-10-CM | POA: Diagnosis not present

## 2018-09-28 ENCOUNTER — Ambulatory Visit: Payer: 59 | Admitting: Internal Medicine

## 2018-09-28 DIAGNOSIS — B372 Candidiasis of skin and nail: Secondary | ICD-10-CM | POA: Diagnosis not present

## 2018-09-28 DIAGNOSIS — L309 Dermatitis, unspecified: Secondary | ICD-10-CM | POA: Diagnosis not present

## 2018-10-19 DIAGNOSIS — L309 Dermatitis, unspecified: Secondary | ICD-10-CM | POA: Diagnosis not present

## 2018-10-29 DIAGNOSIS — M4310 Spondylolisthesis, site unspecified: Secondary | ICD-10-CM | POA: Diagnosis not present

## 2018-11-12 DIAGNOSIS — L304 Erythema intertrigo: Secondary | ICD-10-CM | POA: Diagnosis not present

## 2018-11-30 ENCOUNTER — Encounter: Payer: Self-pay | Admitting: Sports Medicine

## 2018-11-30 ENCOUNTER — Other Ambulatory Visit: Payer: Self-pay

## 2018-11-30 ENCOUNTER — Ambulatory Visit: Payer: 59 | Admitting: Sports Medicine

## 2018-11-30 VITALS — BP 136/86 | Ht 65.0 in | Wt 240.0 lb

## 2018-11-30 DIAGNOSIS — M1711 Unilateral primary osteoarthritis, right knee: Secondary | ICD-10-CM | POA: Diagnosis not present

## 2018-11-30 MED ORDER — METHYLPREDNISOLONE ACETATE 40 MG/ML IJ SUSP
40.0000 mg | Freq: Once | INTRAMUSCULAR | Status: AC
  Administered 2018-11-30: 40 mg via INTRA_ARTICULAR

## 2018-11-30 NOTE — Progress Notes (Signed)
   Subjective:    Patient ID: [name], female    DOB: 27-Apr-1961, 58 y.o.   MRN: [id number]  HPI chief complaint: Right knee pain  Reya comes in today with returning right knee pain.  She has a well-documented history of end-stage, bone-on-bone medial compartmental DJD.  Last cortisone injection was in January.  This was helpful at the time.  Her pain has started to return.  It is identical in nature to what she has experienced previously.  No recent trauma.  She has a custom medial unloader brace but it has not been very helpful.  Interim medical history reviewed Medications reviewed Allergies reviewed   Review of Systems    As above Objective:   Physical Exam  Well-developed, well-nourished.  No acute distress.  Awake alert and oriented x3.  Vital signs reviewed.  Right knee: Range of motion is 0 to 120 degrees.  1+ boggy synovitis.  Trace effusion.  He is tender to palpation along the medial joint line.  No tenderness along the lateral joint line.  Knee is grossly stable to ligamentous exam.  Neurovascularly intact distally.  Walking with an antalgic gait.  X-rays from January of this year show bone-on-bone medial compartmental DJD.  Nothing acute.      Assessment & Plan:   Returning right knee pain secondary to DJD  Patient's knee is injected with cortisone today.  An anterior lateral approach was utilized.  She tolerates this without difficulty.  I have given her a single isometric quad exercise to start doing daily.  Her symptoms are now affecting her activities of daily living.  She is quickly getting to the point to where she would like to consider total knee arthroplasty.  Therefore, I will refer her to Dr. Wynelle Link at [facility] to discuss this further.  Follow-up with me as needed.  Consent obtained and verified. Time-out conducted. Noted no overlying erythema, induration, or other signs of local infection. Skin prepped in a sterile fashion. Topical  analgesic spray: Ethyl chloride. Joint: right knee Needle: 25g 1.5 inch Completed without difficulty. Meds: 3cc 1% xylocaine, 1cc (40mg ) depomedrol  Advised to call if fevers/chills, erythema, induration, drainage, or persistent bleeding.  This note was dictated using Dragon naturally speaking software and may contain errors in syntax, spelling, or content which have not been identified prior to signing this note.

## 2019-02-01 ENCOUNTER — Ambulatory Visit (INDEPENDENT_AMBULATORY_CARE_PROVIDER_SITE_OTHER): Payer: 59 | Admitting: Internal Medicine

## 2019-02-01 ENCOUNTER — Encounter: Payer: Self-pay | Admitting: Internal Medicine

## 2019-02-01 ENCOUNTER — Other Ambulatory Visit: Payer: Self-pay

## 2019-02-01 VITALS — BP 144/86 | HR 89 | Temp 97.8°F | Ht 65.0 in | Wt 236.0 lb

## 2019-02-01 DIAGNOSIS — R739 Hyperglycemia, unspecified: Secondary | ICD-10-CM | POA: Diagnosis not present

## 2019-02-01 DIAGNOSIS — L259 Unspecified contact dermatitis, unspecified cause: Secondary | ICD-10-CM

## 2019-02-01 DIAGNOSIS — I10 Essential (primary) hypertension: Secondary | ICD-10-CM | POA: Diagnosis not present

## 2019-02-01 MED ORDER — METHYLPREDNISOLONE ACETATE 80 MG/ML IJ SUSP
80.0000 mg | Freq: Once | INTRAMUSCULAR | Status: AC
  Administered 2019-02-01: 80 mg via INTRAMUSCULAR

## 2019-02-01 MED ORDER — PREDNISONE 10 MG PO TABS: ORAL_TABLET | ORAL | 0 refills | Status: DC

## 2019-02-01 NOTE — Patient Instructions (Addendum)
You had the steroid shot today   Please take all new medication as prescribed - the prednisone - to [facility] on [address]  Please continue all other medications as before, and refills have been done if requested.  Please have the pharmacy call with any other refills you may need.  Please continue your efforts at being more active, low cholesterol diet, and weight control.  Please keep your appointments with your specialists as you may have planned

## 2019-02-01 NOTE — Progress Notes (Signed)
Subjective:    Patient ID: [name], female    DOB: Sep 06, 1960, 58 y.o.   MRN: [id number]  HPI  Here with muliptle red itchy spots mostly to left face but also left lower neck and arm after working in the yard, without fever or pain.  Located in similar area as prior shingles but seems different without pain.  Pt denies chest pain, increased sob or doe, wheezing, orthopnea, PND, increased LE swelling, palpitations, dizziness or syncope.   Pt denies polydipsia, polyuria.   Pt denies fever, wt loss, night sweats, loss of appetite, or other constitutional symptoms Past Medical History:  Diagnosis Date  . Anemia   . ANEMIA-IRON DEFICIENCY 01/03/2007  . Anxiety   . ANXIETY 01/03/2007  . Complication of anesthesia    severe post-operative nausea except for bladder surgery in 2002 at [place] [facility] not have complications then  . Depression   . DEPRESSION 01/03/2007  . DJD (degenerative joint disease)    knee  . Family history of adverse reaction to anesthesia    mother and father both had severe post-operative nausea and vomiting  . GERD 01/16/2009  . GERD (gastroesophageal reflux disease)   . Headache(784.0) 01/03/2007  . Hyperplastic colon polyp    04/2010  . HYPERSOMNIA 04/04/2010  . Hypertension   . IBS 01/16/2009  . IBS (irritable bowel syndrome)   . Impaired glucose tolerance 05/18/2011  . Menopause    early  . Migraine   . Obesity   . 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 PONV (postoperative nausea and vomiting)   . Wheezing 07/02/2010   Past Surgical History:  Procedure Laterality Date  . ABDOMINAL HYSTERECTOMY    . APPENDECTOMY    . bladder tac    . CHOLECYSTECTOMY    . KNEE ARTHROSCOPY Bilateral   . OOPHORECTOMY      reports that she has never smoked. She has never used smokeless tobacco. She reports that she does not drink alcohol or use drugs. family history includes Diabetes in her brother; Hypertension in her mother. Allergies  Allergen Reactions  .  Meperidine Shortness Of Breath  . Morphine Anaphylaxis  . Penicillins Hives, Nausea Only, Swelling and Other (See Comments)    PATIENT HAS HAD A PCN REACTION WITH IMMEDIATE RASH, FACIAL/TONGUE/THROAT SWELLING, SOB, OR LIGHTHEADEDNESS WITH HYPOTENSION:  #  #  YES  #  #  HAS PT DEVELOPED SEVERE RASH INVOLVING MUCUS MEMBRANES or SKIN NECROSIS: #  #  YES  #  #  PATIENT HAS HAD A PCN REACTION THAT REQUIRED HOSPITALIZATION:  #  #  YES  #  #  Has patient had a PCN reaction occurring within the last 10 years: No   . Lisinopril Cough       . Gabapentin Other (See Comments)    Muscle pain, fatigue  . Amoxicillin Nausea Only  . Azithromycin Nausea And Vomiting  . Codeine Nausea Only and Other (See Comments)    Hot flashes also  . Hydrocodone Itching, Nausea Only and Rash  . Omeprazole Itching and Rash  . Oxycodone-Acetaminophen Nausea And Vomiting and Rash  . Sucralfate Rash and Other (See Comments)    Blurry vision   . Tramadol Itching, Nausea Only and Rash   Current Outpatient Medications on File Prior to Visit  Medication Sig Dispense Refill  . acetaminophen (TYLENOL) 500 MG tablet Take 1,000 mg by mouth daily as needed for moderate pain or headache.    Marland Kitchen azithromycin (ZITHROMAX Z-PAK) 250 MG tablet  2 tab by mouth day 1, then 1 per day 6 tablet 1  . carisoprodol (SOMA) 350 MG tablet Take 1 tablet (350 mg total) by mouth 4 (four) times daily as needed for muscle spasms. 40 tablet 0  . esomeprazole (NEXIUM) 20 MG capsule Take 20 mg by mouth daily.     Marland Kitchen. estradiol (ESTRACE) 0.1 MG/GM vaginal cream Place 1 Applicatorful vaginally every 3 (three) days.     Marland Kitchen. estradiol (ESTRACE) 1 MG tablet Take 1 mg by mouth daily.     Marland Kitchen. losartan (COZAAR) 25 MG tablet TAKE 1 TABLET BY MOUTH EVERY DAY 30 tablet 8  . oxyCODONE (ROXICODONE) 5 MG immediate release tablet Take 1-2 tablets (5-10 mg total) by mouth every 4 (four) hours as needed for severe pain. 50 tablet 0  . Probiotic CAPS Take 1 capsule by mouth  daily.    . promethazine (PHENERGAN) 25 MG tablet Take 25 mg by mouth daily as needed for nausea or vomiting.    . triamcinolone (NASACORT) 55 MCG/ACT AERO nasal inhaler Place 2 sprays into the nose daily. 1 Inhaler 12  . triamcinolone cream (KENALOG) 0.1 % Apply 1 application topically daily as needed. 30 g 1   No current facility-administered medications on file prior to visit.    Review of Systems  Constitutional: Negative for other unusual diaphoresis or sweats HENT: Negative for ear discharge or swelling Eyes: Negative for other worsening visual disturbances Respiratory: Negative for stridor or other swelling  Gastrointestinal: Negative for worsening distension or other blood Genitourinary: Negative for retention or other urinary change Musculoskeletal: Negative for other MSK pain or swelling Skin: Negative for color change or other new lesions Neurological: Negative for worsening tremors and other numbness  Psychiatric/Behavioral: Negative for worsening agitation or other fatigue All other system neg per pt    Objective:   Physical Exam BP (!) 144/86   Pulse 89   Temp 97.8 F (36.6 C) (Oral)   Ht 5\' 5"  (1.651 m)   Wt 236 lb (107 kg)   SpO2 96%   BMI 39.27 kg/m  VS noted,  Constitutional: Pt appears in NAD HENT: Head: NCAT.  Right Ear: External ear normal.  Left Ear: External ear normal.  Eyes: . Pupils are equal, round, and reactive to light. Conjunctivae and EOM are normal Nose: without d/c or deformity Neck: Neck supple. Gross normal ROM Cardiovascular: Normal rate and regular rhythm.   Pulmonary/Chest: Effort normal and breath sounds without rales or wheezing.  Abd:  Soft, NT, ND, + BS, no organomegaly Neurological: Pt is alert. At baseline orientation, motor grossly intact Skin: Skin is warm. Multiple erythem itchy red lesions to face, left lower neck and left arm, no other new lesions, no LE edema Psychiatric: Pt behavior is normal without agitation  No other  exam findings Lab Results  Component Value Date   WBC 5.8 05/20/2018   HGB 13.4 05/20/2018   HCT 40.5 05/20/2018   PLT 243.0 05/20/2018   GLUCOSE 181 (H) 05/20/2018   CHOL 181 05/20/2018   TRIG (H) 05/20/2018    401.0 Triglyceride is over 400; calculations on Lipids are invalid.   HDL 46.90 05/20/2018   LDLDIRECT 101.0 05/20/2018   LDLCALC 96 04/02/2010   ALT 15 05/20/2018   AST 15 05/20/2018   NA 137 05/20/2018   K 3.6 05/20/2018   CL 103 05/20/2018   CREATININE 0.75 05/20/2018   BUN 10 05/20/2018   CO2 24 05/20/2018   TSH 1.44 05/20/2018  HGBA1C 6.6 (H) 05/20/2018       Assessment & Plan:

## 2019-02-01 NOTE — Assessment & Plan Note (Signed)
stable overall by history and exam, recent data reviewed with pt, and pt to continue medical treatment as before,  to f/u any worsening symptoms or concerns  

## 2019-02-01 NOTE — Assessment & Plan Note (Signed)
Mild to mod, for depomedrol IM 80, predpac asd, to f/u any worsening symptoms or concerns 

## 2019-02-09 ENCOUNTER — Telehealth: Payer: Self-pay | Admitting: Internal Medicine

## 2019-02-09 NOTE — Telephone Encounter (Signed)
Recv'd medical records from [facility] forwarded 6 pages to Dr. Cathlean Cower /[id number]

## 2019-02-17 ENCOUNTER — Other Ambulatory Visit: Payer: Self-pay

## 2019-02-17 ENCOUNTER — Ambulatory Visit (INDEPENDENT_AMBULATORY_CARE_PROVIDER_SITE_OTHER): Payer: 59 | Admitting: Internal Medicine

## 2019-02-17 ENCOUNTER — Encounter: Payer: Self-pay | Admitting: Internal Medicine

## 2019-02-17 VITALS — BP 136/78 | HR 97 | Temp 98.1°F | Ht 65.0 in | Wt 238.0 lb

## 2019-02-17 DIAGNOSIS — T380X5A Adverse effect of glucocorticoids and synthetic analogues, initial encounter: Secondary | ICD-10-CM | POA: Diagnosis not present

## 2019-02-17 DIAGNOSIS — L259 Unspecified contact dermatitis, unspecified cause: Secondary | ICD-10-CM

## 2019-02-17 DIAGNOSIS — E099 Drug or chemical induced diabetes mellitus without complications: Secondary | ICD-10-CM | POA: Diagnosis not present

## 2019-02-17 DIAGNOSIS — I10 Essential (primary) hypertension: Secondary | ICD-10-CM | POA: Diagnosis not present

## 2019-02-17 MED ORDER — PREDNISONE 10 MG PO TABS: ORAL_TABLET | ORAL | 0 refills | Status: DC

## 2019-02-17 MED ORDER — METHYLPREDNISOLONE ACETATE 80 MG/ML IJ SUSP
80.0000 mg | Freq: Once | INTRAMUSCULAR | Status: AC
  Administered 2019-02-17: 17:00:00 80 mg via INTRAMUSCULAR

## 2019-02-17 NOTE — Assessment & Plan Note (Signed)
stable overall by history and exam, recent data reviewed with pt, and pt to continue medical treatment as before,  to f/u any worsening symptoms or concerns  

## 2019-02-17 NOTE — Patient Instructions (Signed)
You had the steroid shot today  Please take all new medication as prescribed - the prednisone  Please continue all other medications as before, and refills have been done if requested.  Please have the pharmacy call with any other refills you may need.  Please continue your efforts at being more active, low cholesterol diet, and weight control.  Please keep your appointments with your specialists as you may have planned   

## 2019-02-17 NOTE — Progress Notes (Signed)
Subjective:    Patient ID: [name], female    DOB: Dec 19, 1960, 58 y.o.   MRN: [id number]  HPI  Here with c/o marked itchy swelling to bilat facies left > right with redness, as well as muliple itchy "spots" to several fingers of the left hand and distal arm, after working in the yard a few days ago.  Was just seen for a different episode of contact dermatitis and did well with the prednisone, but now recurred.  Pt denies chest pain, increased sob or doe, wheezing, orthopnea, PND, increased LE swelling, palpitations, dizziness or syncope. No tongue swelling, no fever, no pain.   Pt denies polydipsia, polyuria, Pt denies new neurological symptoms such as new headache, or facial or extremity weakness or numbness      Past Medical History:  Diagnosis Date  . Anemia   . ANEMIA-IRON DEFICIENCY 01/03/2007  . Anxiety   . ANXIETY 01/03/2007  . Complication of anesthesia    severe post-operative nausea except for bladder surgery in 2002 at [place] not have complications then  . Depression   . DEPRESSION 01/03/2007  . DJD (degenerative joint disease)    knee  . Family history of adverse reaction to anesthesia    mother and father both had severe post-operative nausea and vomiting  . GERD 01/16/2009  . GERD (gastroesophageal reflux disease)   . Headache(784.0) 01/03/2007  . Hyperplastic colon polyp    04/2010  . HYPERSOMNIA 04/04/2010  . Hypertension   . IBS 01/16/2009  . IBS (irritable bowel syndrome)   . Impaired glucose tolerance 05/18/2011  . Menopause    early  . Migraine   . Obesity   . 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 PONV (postoperative nausea and vomiting)   . Wheezing 07/02/2010   Past Surgical History:  Procedure Laterality Date  . ABDOMINAL HYSTERECTOMY    . APPENDECTOMY    . bladder tac    . CHOLECYSTECTOMY    . KNEE ARTHROSCOPY Bilateral   . OOPHORECTOMY      reports that she has never smoked. She has never used smokeless tobacco. She reports  that she does not drink alcohol or use drugs. family history includes Diabetes in her brother; Hypertension in her mother. Allergies  Allergen Reactions  . Meperidine Shortness Of Breath  . Morphine Anaphylaxis  . Penicillins Hives, Nausea Only, Swelling and Other (See Comments)    PATIENT HAS HAD A PCN REACTION WITH IMMEDIATE RASH, FACIAL/TONGUE/THROAT SWELLING, SOB, OR LIGHTHEADEDNESS WITH HYPOTENSION:  #  #  YES  #  #  HAS PT DEVELOPED SEVERE RASH INVOLVING MUCUS MEMBRANES or SKIN NECROSIS: #  #  YES  #  #  PATIENT HAS HAD A PCN REACTION THAT REQUIRED HOSPITALIZATION:  #  #  YES  #  #  Has patient had a PCN reaction occurring within the last 10 years: No   . Lisinopril Cough       . Gabapentin Other (See Comments)    Muscle pain, fatigue  . Amoxicillin Nausea Only  . Azithromycin Nausea And Vomiting  . Codeine Nausea Only and Other (See Comments)    Hot flashes also  . Hydrocodone Itching, Nausea Only and Rash  . Omeprazole Itching and Rash  . Oxycodone-Acetaminophen Nausea And Vomiting and Rash  . Sucralfate Rash and Other (See Comments)    Blurry vision   . Tramadol Itching, Nausea Only and Rash   Current Outpatient Medications on File Prior to Visit  Medication Sig  Dispense Refill  . acetaminophen (TYLENOL) 500 MG tablet Take 1,000 mg by mouth daily as needed for moderate pain or headache.    . carisoprodol (SOMA) 350 MG tablet Take 1 tablet (350 mg total) by mouth 4 (four) times daily as needed for muscle spasms. 40 tablet 0  . esomeprazole (NEXIUM) 20 MG capsule Take 20 mg by mouth daily.     Marland Kitchen. estradiol (ESTRACE) 0.1 MG/GM vaginal cream Place 1 Applicatorful vaginally every 3 (three) days.     Marland Kitchen. estradiol (ESTRACE) 1 MG tablet Take 1 mg by mouth daily.     Marland Kitchen. losartan (COZAAR) 25 MG tablet TAKE 1 TABLET BY MOUTH EVERY DAY 30 tablet 8  . oxyCODONE (ROXICODONE) 5 MG immediate release tablet Take 1-2 tablets (5-10 mg total) by mouth every 4 (four) hours as needed for severe  pain. 50 tablet 0  . Probiotic CAPS Take 1 capsule by mouth daily.    . promethazine (PHENERGAN) 25 MG tablet Take 25 mg by mouth daily as needed for nausea or vomiting.    . triamcinolone (NASACORT) 55 MCG/ACT AERO nasal inhaler Place 2 sprays into the nose daily. 1 Inhaler 12  . triamcinolone cream (KENALOG) 0.1 % Apply 1 application topically daily as needed. 30 g 1   No current facility-administered medications on file prior to visit.    Review of Systems  Constitutional: Negative for other unusual diaphoresis or sweats HENT: Negative for ear discharge or swelling Eyes: Negative for other worsening visual disturbances Respiratory: Negative for stridor or other swelling  Gastrointestinal: Negative for worsening distension or other blood Genitourinary: Negative for retention or other urinary change Musculoskeletal: Negative for other MSK pain or swelling Skin: Negative for color change or other new lesions Neurological: Negative for worsening tremors and other numbness  Psychiatric/Behavioral: Negative for worsening agitation or other fatigue All other system neg per pt    Objective:   Physical Exam BP 136/78   Pulse 97   Temp 98.1 F (36.7 C) (Oral)   Ht 5\' 5"  (1.651 m)   Wt 238 lb (108 kg)   SpO2 96%   BMI 39.61 kg/m  VS noted,  Constitutional: Pt appears in NAD HENT: Head: NCAT.  Right Ear: External ear normal.  Left Ear: External ear normal.  Eyes: . Pupils are equal, round, and reactive to light. Conjunctivae and EOM are normal Nose: without d/c or deformity Neck: Neck supple. Gross normal ROM Cardiovascular: Normal rate and regular rhythm.   Pulmonary/Chest: Effort normal and breath sounds without rales or wheezing.  Neurological: Pt is alert. At baseline orientation, motor grossly intact Skin: Skin is warm, no LE edema but has 1-2+ left > right facial angioedema nondiscrete large area including the left submandibular area without tenderness or lymphadenopathy;  Has  several pruritic spots to several fingers of the left hand and distal arm below the elbow Psychiatric: Pt behavior is normal without agitation  No other exam findings Lab Results  Component Value Date   WBC 5.8 05/20/2018   HGB 13.4 05/20/2018   HCT 40.5 05/20/2018   PLT 243.0 05/20/2018   GLUCOSE 181 (H) 05/20/2018   CHOL 181 05/20/2018   TRIG (H) 05/20/2018    401.0 Triglyceride is over 400; calculations on Lipids are invalid.   HDL 46.90 05/20/2018   LDLDIRECT 101.0 05/20/2018   LDLCALC 96 04/02/2010   ALT 15 05/20/2018   AST 15 05/20/2018   NA 137 05/20/2018   K 3.6 05/20/2018   CL 103 05/20/2018  CREATININE 0.75 05/20/2018   BUN 10 05/20/2018   CO2 24 05/20/2018   TSH 1.44 05/20/2018   HGBA1C 6.6 (H) 05/20/2018       Assessment & Plan:

## 2019-02-17 NOTE — Assessment & Plan Note (Signed)
Mod to severe facial and LUE after working in the yard, exact etiology unclear but for depomedrol IM 80, predpac asd, benadryl 50 qid prn,  to f/u any worsening symptoms or concerns

## 2019-02-24 NOTE — Progress Notes (Signed)
Cardiology Office Note   Date:  02/25/2019   ID:  Stacey BombardSylvia B [name], DOB 05/14/1961, MRN [id number]  PCP:  [name], Stacey W, MD  Cardiologist:   Stacey Siiffany Kenny Lake, MD   No chief complaint on file.   History of Present Illness: Stacey BombardSylvia B [name] is a 58 y.o. female with hypertension, gastroesophageal reflux disease and depression who presents for follow up.  [name] was referred 04/2015 for a complaint of dizziness and dyspnea on exertion.  EKG showed sinus rhythm with low voltage and poor R wave progression.  BNP was 17.  The episodes occurred with exertion and were associated with a flushed feeling.  Her symptoms were felt to be neurocardiogenic and she was asked to wear compression stockings (also to help with her varicose veins) and increase her fluid intake.  Lab testing for pheochromocytoma and carcinoid were negative.  She underwent Lexiscan Cardiolite 04/2015 that was negative for ischemia and echocardiogram 05/2015 revealed grade 1 diastolic dysfunction.  Her blood pressure was intermittently elevated. She reported having a lot of anxiety and stress at the time. She was instructed to follow-up with her PCP or psychiatrist to discuss her anxiety.  Her blood pressure remained elevated so she was started on lisinopril.  This was switched to losartan due to cough.  At her last appointment she reported lower extremity numbness and claudication.  She was referred for ABIs 01/30/17 that were normal bilaterally.  [name]  has been very stressed lately.  She and her husband don't get along well and they argue.  She has an adult son who has seizures.  They are also doing some remodeling in her home and her back pain has been poorly-controlled.  She thinks she will need to have surgery again on her back and also have her knees replaced.  She has been struggling with recurrent episodes of contact dermatitis.  She is unsure what is causing it.  She has been working outside a lot and the thought is that  something outside in her yard is causing the episodes.  The outbreaks only occur on her left side.  However they are not in a consistent dermatome.  She has been on prednisone lately for the contact dermatitis.  This does seem to help.  She has had shingles in the past.  She denies any chest pain or shortness of breath.  She has not experienced any lower extremity edema, orthopnea, or PND.  She does not check her blood pressure regularly.    Past Medical History:  Diagnosis Date  . Anemia   . ANEMIA-IRON DEFICIENCY 01/03/2007  . Anxiety   . ANXIETY 01/03/2007  . Complication of anesthesia    severe post-operative nausea except for bladder surgery in 2002 at [facility]-did not have complications then  . Depression   . DEPRESSION 01/03/2007  . DJD (degenerative joint disease)    knee  . Family history of adverse reaction to anesthesia    mother and father both had severe post-operative nausea and vomiting  . GERD 01/16/2009  . GERD (gastroesophageal reflux disease)   . Headache(784.0) 01/03/2007  . Hyperplastic colon polyp    04/2010  . HYPERSOMNIA 04/04/2010  . Hypertension   . IBS 01/16/2009  . IBS (irritable bowel syndrome)   . Impaired glucose tolerance 05/18/2011  . Menopause    early  . Migraine   . Obesity   . 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 PONV (postoperative nausea and vomiting)   . Wheezing 07/02/2010  Past Surgical History:  Procedure Laterality Date  . ABDOMINAL HYSTERECTOMY    . APPENDECTOMY    . bladder tac    . CHOLECYSTECTOMY    . KNEE ARTHROSCOPY Bilateral   . OOPHORECTOMY       Current Outpatient Medications  Medication Sig Dispense Refill  . acetaminophen (TYLENOL) 500 MG tablet Take 1,000 mg by mouth daily as needed for moderate pain or headache.    . carisoprodol (SOMA) 350 MG tablet Take 1 tablet (350 mg total) by mouth 4 (four) times daily as needed for muscle spasms. 40 tablet 0  . esomeprazole (NEXIUM) 20 MG capsule Take 20 mg by  mouth daily.     Marland Kitchen. estradiol (ESTRACE) 0.1 MG/GM vaginal cream Place 1 Applicatorful vaginally every 3 (three) days.     Marland Kitchen. estradiol (ESTRACE) 1 MG tablet Take 1 mg by mouth daily.     Marland Kitchen. losartan (COZAAR) 50 MG tablet Take 1 tablet (50 mg total) by mouth daily. New dose, d/c previous rx 90 tablet 3  . oxyCODONE (ROXICODONE) 5 MG immediate release tablet Take 1-2 tablets (5-10 mg total) by mouth every 4 (four) hours as needed for severe pain. 50 tablet 0  . predniSONE (DELTASONE) 10 MG tablet 4 tabs by mouth for 3 days,3tabs x 3 days,2tab x 3 days, 1 tab x 3 days 30 tablet 0  . Probiotic CAPS Take 1 capsule by mouth daily.    . promethazine (PHENERGAN) 25 MG tablet Take 25 mg by mouth daily as needed for nausea or vomiting.    . triamcinolone (NASACORT) 55 MCG/ACT AERO nasal inhaler Place 2 sprays into the nose daily. 1 Inhaler 12  . triamcinolone cream (KENALOG) 0.1 % Apply 1 application topically daily as needed. 30 g 1   No current facility-administered medications for this visit.     Allergies:   Meperidine, Morphine, Penicillins, Lisinopril, Gabapentin, Amoxicillin, Azithromycin, Codeine, Hydrocodone, Omeprazole, Oxycodone-acetaminophen, Sucralfate, and Tramadol    Social History:  The patient  reports that she has never smoked. She has never used smokeless tobacco. She reports that she does not drink alcohol or use drugs.   Family History:  The patient's family history includes Diabetes in her brother; Hypertension in her mother.    ROS:  Please see the history of present illness.   Otherwise, review of systems are positive for none.   All other systems are reviewed and negative.    PHYSICAL EXAM: VS:  BP (!) 151/77   Pulse (!) 58   Temp (!) 96.8 F (36 C)   Ht 5\' 5"  (1.651 m)   Wt 239 lb (108.4 kg)   SpO2 97%   BMI 39.77 kg/m  , BMI Body mass index is 39.77 kg/m. GENERAL:  Well appearing HEENT: Pupils equal round and reactive, fundi not visualized, oral mucosa unremarkable  NECK:  No jugular venous distention, waveform within normal limits, carotid upstroke brisk and symmetric, no bruits, no thyromegaly LUNGS:  Clear to auscultation bilaterally HEART:  RRR.  PMI not displaced or sustained,S1 and S2 within normal limits, no S3, no S4, no clicks, no rubs, no murmurs ABD:  Flat, positive bowel sounds normal in frequency in pitch, no bruits, no rebound, no guarding, no midline pulsatile mass, no hepatomegaly, no splenomegaly EXT:  2 plus radial pulses.  1+ DP/PT bilaterally.  No edema, no cyanosis no clubbing SKIN:  No rashes no nodules NEURO:  Cranial nerves II through XII grossly intact, motor grossly intact throughout PSYCH:  Cognitively intact,  oriented to person place and time   EKG:  EKG is ordered today. 01/12/16: Sinus bradycardia.  Rate 56 bpm.  08/02/16: Sinus rhythm.  Rate 78 bpm.   05/08/17: Sinus rhythm.  Rate 76 bpm.  Poor R wave progression.  Low voltage limb leads. 02/24/2018: Sinus rhythm.  Rate 67 bpm. 02/25/19: Sinus bradycardia.  Rate 53 bpm.  Nonspecific T wave abnormalities.  Echo 06/07/15: Study Conclusions  - Left ventricle: The cavity size was normal. There was mild focal basal hypertrophy of the septum. Systolic function was vigorous. The estimated ejection fraction was in the range of 65% to 70%. Wall motion was normal; there were no regional wall motion abnormalities. Doppler parameters are consistent with abnormal left ventricular relaxation (grade 1 diastolic dysfunction). There was no evidence of elevated ventricular filling pressure by Doppler parameters. - Aortic valve: Trileaflet; normal thickness leaflets. There was no regurgitation. - Aortic root: The aortic root was normal in size. - Ascending aorta: The ascending aorta was normal in size. - Mitral valve: There was trivial regurgitation. - Left atrium: The atrium was normal in size. - Right ventricle: Systolic function was normal. - Tricuspid valve: There was  trivial regurgitation. - Pulmonic valve: There was no regurgitation. - Pulmonary arteries: Systolic pressure was within the normal range. - Inferior vena cava: The vessel was normal in size. - Pericardium, extracardiac: There was no pericardial effusion.  Lexiscan Cardiolite 05/24/15:  Nuclear stress EF: 73%.  The left ventricular ejection fraction is hyperdynamic (>65%).  There was no ST segment deviation noted during stress.  The study is normal.  ABIs 01/30/17: normal  Recent Labs: 05/20/2018: ALT 15; BUN 10; Creatinine, Ser 0.75; Hemoglobin 13.4; Platelets 243.0; Potassium 3.6; Sodium 137; TSH 1.44    Lipid Panel    Component Value Date/Time   CHOL 181 05/20/2018 1642   TRIG (H) 05/20/2018 1642    401.0 Triglyceride is over 400; calculations on Lipids are invalid.   HDL 46.90 05/20/2018 1642   CHOLHDL 4 05/20/2018 1642   VLDL 62.0 (H) 02/28/2017 0818   LDLCALC 96 04/02/2010 0850   LDLDIRECT 101.0 05/20/2018 1642      Wt Readings from Last 3 Encounters:  02/25/19 239 lb (108.4 kg)  02/17/19 238 lb (108 kg)  02/01/19 236 lb (107 kg)      ASSESSMENT AND PLAN:  # Hypertension:  Blood pressure is above goal both initially and repeat.  This is likely multifactorial due to inactivity, stress, and prednisone use.  Her blood pressure is never been very well controlled.  We will increase losartan to 50 mg.  She will get a comprehensive metabolic panel checked in a week.  I have asked her to check her blood pressure at home.  # Chest pain: Resolved.  Her chest pain was associated with anxiety.  Stress testing 04/2015 was negative for ischemia.     # Claudication: # Numbness: ABIs normal.  This is sciatica and back related.    # Pre-surgical risk assessment:  Ms. Gens has no unstable cardiac conditions.  She is at low risk for both back and knee surgeries.   # CV Disease Prevention: She will return for fasting lipids and CMP.  Time spent: 22 minutes-Greater than  50% of this time was spent in counseling, explanation of diagnosis, planning of further management, and coordination of care.   Current medicines are reviewed at length with the patient today.  The patient does not have concerns regarding medicines.  The following changes have been  made: Increase losartan to 50 mg.  Labs/ tests ordered today include:   Orders Placed This Encounter  Procedures  . Lipid panel  . Comprehensive metabolic panel  . EKG 12-Lead      Disposition:   FU with Veneta Sliter C. Duke Salvia, MD in 2 months.   Signed, Stacey Si, MD  02/25/2019 1:03 PM    [facility]

## 2019-02-25 ENCOUNTER — Encounter: Payer: Self-pay | Admitting: Cardiovascular Disease

## 2019-02-25 ENCOUNTER — Ambulatory Visit: Payer: 59 | Admitting: Cardiovascular Disease

## 2019-02-25 ENCOUNTER — Other Ambulatory Visit: Payer: Self-pay

## 2019-02-25 VITALS — BP 151/77 | HR 58 | Temp 96.8°F | Ht 65.0 in | Wt 239.0 lb

## 2019-02-25 DIAGNOSIS — Z5181 Encounter for therapeutic drug level monitoring: Secondary | ICD-10-CM | POA: Diagnosis not present

## 2019-02-25 DIAGNOSIS — I10 Essential (primary) hypertension: Secondary | ICD-10-CM

## 2019-02-25 MED ORDER — LOSARTAN POTASSIUM 50 MG PO TABS: 50.0000 mg | ORAL_TABLET | Freq: Every day | ORAL | 3 refills | Status: DC

## 2019-02-25 NOTE — Patient Instructions (Signed)
Medication Instructions:  INCREASE YOUR LOSARTAN TO 50 MG DAILY   If you need a refill on your cardiac medications before your next appointment, please call your pharmacy.   Lab work: FASTING LP/CMET IN 1 WEEK   If you have labs (blood work) drawn today and your tests are completely normal, you will receive your results only by: Marland Kitchen MyChart Message (if you have MyChart) OR . A paper copy in the mail If you have any lab test that is abnormal or we need to change your treatment, we will call you to review the results.  Testing/Procedures: NONE  Follow-Up: At [facility], you and your health needs are our priority.  As part of our continuing mission to provide you with exceptional heart care, we have created designated Provider Care Teams.  These Care Teams include your primary Cardiologist (physician) and Advanced Practice Providers (APPs -  Physician Assistants and Nurse Practitioners) who all work together to provide you with the care you need, when you need it. You will need a follow up appointment in 2 months.   You may see Skeet Latch, MD or one of the following Advanced Practice Providers on your designated Care Team:   Kerin Ransom, PA-C Roby Lofts, [place] . Sande Rives, PA-C  Any Other Special Instructions Will Be Listed Below (If Applicable).  MONITOR AND LOG YOUR BLOOD PRESSURE AT HOME, BRING READINGS TO FOLLOW UP

## 2019-03-05 DIAGNOSIS — M25561 Pain in right knee: Secondary | ICD-10-CM | POA: Insufficient documentation

## 2019-03-08 ENCOUNTER — Ambulatory Visit: Payer: 59

## 2019-03-09 ENCOUNTER — Ambulatory Visit (INDEPENDENT_AMBULATORY_CARE_PROVIDER_SITE_OTHER): Payer: 59 | Admitting: Family

## 2019-03-09 ENCOUNTER — Ambulatory Visit: Payer: Self-pay

## 2019-03-09 ENCOUNTER — Other Ambulatory Visit: Payer: Self-pay

## 2019-03-09 ENCOUNTER — Encounter: Payer: Self-pay | Admitting: Family

## 2019-03-09 VITALS — BP 146/84 | HR 76 | Temp 97.7°F | Ht 65.0 in | Wt 237.4 lb

## 2019-03-09 DIAGNOSIS — W57XXXA Bitten or stung by nonvenomous insect and other nonvenomous arthropods, initial encounter: Secondary | ICD-10-CM | POA: Diagnosis not present

## 2019-03-09 DIAGNOSIS — S80862A Insect bite (nonvenomous), left lower leg, initial encounter: Secondary | ICD-10-CM

## 2019-03-09 MED ORDER — DOXYCYCLINE HYCLATE 100 MG PO TABS: 100.0000 mg | ORAL_TABLET | Freq: Two times a day (BID) | ORAL | 0 refills | Status: DC

## 2019-03-09 MED ORDER — MUPIROCIN 2 % EX OINT: 1.0000 "application " | TOPICAL_OINTMENT | Freq: Two times a day (BID) | CUTANEOUS | 0 refills | Status: DC

## 2019-03-09 NOTE — Progress Notes (Signed)
[name] is a 58 y.o. female with the following history as recorded in EpicCare:  Patient Active Problem List   Diagnosis Date Noted  . Acute sinus infection 05/20/2018  . Blurred vision 05/20/2018  . Allergic rhinitis 05/20/2018  . Degenerative spondylolisthesis 03/03/2018  . Preop exam for internal medicine 02/18/2018  . Shingles 02/09/2018  . 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 Tinea cruris 01/05/2018  . Contact dermatitis 12/24/2017  . Surgical menopause 03/05/2017  . Vitamin D deficiency 03/05/2017  . Dizziness 06/22/2016  . Nausea without vomiting 06/22/2016  . Hyperglycemia 02/29/2016  . Hypertriglyceridemia 02/29/2016  . Dyspnea on exertion 04/15/2015  . Essential hypertension 04/10/2015  . Delayed gastric emptying 12/16/2012  . Chronic pain 05/08/2012  . Muscle spasm of back 05/08/2012  . Chest pain 05/08/2012  . Gastroparesis 05/22/2011  . Impaired glucose tolerance 05/18/2011  . Encounter for well adult exam with abnormal findings 05/18/2011  . HYPERSOMNIA 04/04/2010  . GERD 01/16/2009  . IBS 01/16/2009  . 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 ANEMIA-IRON DEFICIENCY 01/03/2007  . ANXIETY 01/03/2007  . DEPRESSION 01/03/2007    Current Outpatient Medications  Medication Sig Dispense Refill  . acetaminophen (TYLENOL) 500 MG tablet Take 1,000 mg by mouth daily as needed for moderate pain or headache.    . carisoprodol (SOMA) 350 MG tablet Take 1 tablet (350 mg total) by mouth 4 (four) times daily as needed for muscle spasms. 40 tablet 0  . doxycycline (VIBRA-TABS) 100 MG tablet Take 1 tablet (100 mg total) by mouth 2 (two) times daily. 20 tablet 0  . esomeprazole (NEXIUM) 20 MG capsule Take 20 mg by mouth daily.     Marland Kitchen estradiol (ESTRACE) 0.1 MG/GM vaginal cream Place 1 Applicatorful vaginally every 3 (three) days.     Marland Kitchen estradiol (ESTRACE) 1 MG tablet Take 1 mg by mouth daily.     Marland Kitchen losartan (COZAAR) 50 MG tablet Take 1 tablet (50 mg  total) by mouth daily. New dose, d/c previous rx 90 tablet 3  . mupirocin ointment (BACTROBAN) 2 % Apply 1 application topically 2 (two) times daily. 30 g 0  . predniSONE (DELTASONE) 10 MG tablet 4 tabs by mouth for 3 days,3tabs x 3 days,2tab x 3 days, 1 tab x 3 days 30 tablet 0  . Probiotic CAPS Take 1 capsule by mouth daily.    . promethazine (PHENERGAN) 25 MG tablet Take 25 mg by mouth daily as needed for nausea or vomiting.    . triamcinolone (NASACORT) 55 MCG/ACT AERO nasal inhaler Place 2 sprays into the nose daily. 1 Inhaler 12  . triamcinolone cream (KENALOG) 0.1 % Apply 1 application topically daily as needed. 30 g 1   No current facility-administered medications for this visit.     Allergies: Meperidine, Morphine, Penicillins, Lisinopril, Gabapentin, Amoxicillin, Azithromycin, Codeine, Hydrocodone, Omeprazole, Oxycodone-acetaminophen, Sucralfate, and Tramadol  Past Medical History:  Diagnosis Date  . Anemia   . ANEMIA-IRON DEFICIENCY 01/03/2007  . Anxiety   . ANXIETY 01/03/2007  . Complication of anesthesia    severe post-operative nausea except for bladder surgery in 2002 at [facility] not have complications then  . Depression   . DEPRESSION 01/03/2007  . DJD (degenerative joint disease)    knee  . Family history of adverse reaction to anesthesia    mother and father both had severe post-operative nausea and vomiting  . GERD 01/16/2009  . GERD (gastroesophageal reflux disease)   . Headache(784.0) 01/03/2007  . Hyperplastic colon polyp  04/2010  . HYPERSOMNIA 04/04/2010  . Hypertension   . IBS 01/16/2009  . IBS (irritable bowel syndrome)   . Impaired glucose tolerance 05/18/2011  . Menopause    early  . Migraine   . Obesity   . 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 PONV (postoperative nausea and vomiting)   . Wheezing 07/02/2010    Past Surgical History:  Procedure Laterality Date  . ABDOMINAL HYSTERECTOMY    . APPENDECTOMY    . bladder tac    .  CHOLECYSTECTOMY    . KNEE ARTHROSCOPY Bilateral   . OOPHORECTOMY      Family History  Problem Relation Age of Onset  . Hypertension Mother   . Diabetes Brother     Social History   Tobacco Use  . Smoking status: Never Smoker  . Smokeless tobacco: Never Used  Substance Use Topics  . Alcohol use: No    Alcohol/week: 0.0 standard drinks    Subjective:   Suspect insect bite left out lower leg; symptoms x 2 days; suspects it occurs while working out in her yard; has been covering with Neosporing; husband was concerned that area this morning was looking worse; no fever, no streaking from the site of the suspected bite;      Objective:  Vitals:   03/09/19 1147  BP: (!) 146/84  Pulse: 76  Temp: 97.7 F (36.5 C)  TempSrc: Oral  SpO2: 96%  Weight: 237 lb 6.4 oz (107.7 kg)  Height: 5\' 5"  (1.651 m)    General: Well developed, well nourished, in no acute distress  Skin : Warm and dry. Circular lesion with well defined border; erythema; no streaking; Head: Normocephalic and atraumatic  Lungs: Respirations unlabored;  Musculoskeletal: No deformities; no active joint inflammation  Extremities: No edema, cyanosis, clubbing; no swelling  Vessels: Symmetric bilaterally  Neurologic: Alert and oriented; speech intact; face symmetrical; moves all extremities well; CNII-XII intact without focal deficit   Assessment:  1. Insect bite of left lower leg, initial encounter     Plan:  Rx for Doxycycline 100 mg bid x 10 days, Bactroban bid to affected area; follow-up worse, no better.   No follow-ups on file.  No orders of the defined types were placed in this encounter.   Requested Prescriptions   Signed Prescriptions Disp Refills  . doxycycline (VIBRA-TABS) 100 MG tablet 20 tablet 0    Sig: Take 1 tablet (100 mg total) by mouth 2 (two) times daily.  . mupirocin ointment (BACTROBAN) 2 % 30 g 0    Sig: Apply 1 application topically 2 (two) times daily.

## 2019-03-09 NOTE — Telephone Encounter (Signed)
Pt. Noticed last night a "possible spider bite to her left calf", outer aspect. Circular, quarter sized. Brown on edges and pink in the middle. Itchy, no pain. No fever .Warm transfer to Sam in the practice for a visit.  Answer Assessment - Initial Assessment Questions 1. TYPE of INSECT: "What type of insect was it?"      Possible spider 2. ONSET: "When did you get bitten?"      Yesterday 3. LOCATION: "Where is the insect bite located?"      Left calf, outer aspect 4. REDNESS: "Is the area red or pink?" If so, ask "What size is area of redness?" (inches or cm). "When did the redness start?"     Circle-shape - brown outside and pink inside 5. PAIN: "Is there any pain?" If so, ask: "How bad is it?"  (Scale 1-10; or mild, moderate, severe)     No 6. ITCHING: "Does it itch?" If so, ask: "How bad is the itch?"    - MILD: doesn't interfere with normal activities   - MODERATE-SEVERE: interferes with work, school, sleep, or other activities      Moderate 7. SWELLING: "How big is the swelling?" (inches, cm, or compare to coins)     No swelling 8. OTHER SYMPTOMS: "Do you have any other symptoms?"  (e.g., difficulty breathing, hives)     No 9. PREGNANCY: "Is there any chance you are pregnant?" "When was your last menstrual period?"     No  Protocols used: INSECT BITE-A-AH

## 2019-03-09 NOTE — Telephone Encounter (Signed)
Pt is seeing Laura today.

## 2019-03-12 ENCOUNTER — Other Ambulatory Visit: Payer: Self-pay

## 2019-03-12 ENCOUNTER — Encounter: Payer: Self-pay | Admitting: Family

## 2019-03-12 ENCOUNTER — Ambulatory Visit (INDEPENDENT_AMBULATORY_CARE_PROVIDER_SITE_OTHER): Payer: 59 | Admitting: Family

## 2019-03-12 VITALS — BP 142/86 | HR 95 | Temp 97.9°F | Ht 65.0 in | Wt 238.1 lb

## 2019-03-12 DIAGNOSIS — L03116 Cellulitis of left lower limb: Secondary | ICD-10-CM | POA: Diagnosis not present

## 2019-03-12 DIAGNOSIS — R110 Nausea: Secondary | ICD-10-CM

## 2019-03-12 MED ORDER — SULFAMETHOXAZOLE-TRIMETHOPRIM 800-160 MG PO TABS: 1.0000 | ORAL_TABLET | Freq: Two times a day (BID) | ORAL | 0 refills | Status: DC

## 2019-03-12 MED ORDER — PREDNISONE 20 MG PO TABS: 20.0000 mg | ORAL_TABLET | Freq: Every day | ORAL | 0 refills | Status: DC

## 2019-03-12 NOTE — Progress Notes (Signed)
[name] is a 58 y.o. female with the following history as recorded in EpicCare:  Patient Active Problem List   Diagnosis Date Noted  . Acute sinus infection 05/20/2018  . Blurred vision 05/20/2018  . Allergic rhinitis 05/20/2018  . Degenerative spondylolisthesis 03/03/2018  . Preop exam for internal medicine 02/18/2018  . Shingles 02/09/2018  . 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 Tinea cruris 01/05/2018  . Contact dermatitis 12/24/2017  . Surgical menopause 03/05/2017  . Vitamin D deficiency 03/05/2017  . Dizziness 06/22/2016  . Nausea without vomiting 06/22/2016  . Hyperglycemia 02/29/2016  . Hypertriglyceridemia 02/29/2016  . Dyspnea on exertion 04/15/2015  . Essential hypertension 04/10/2015  . Delayed gastric emptying 12/16/2012  . Chronic pain 05/08/2012  . Muscle spasm of back 05/08/2012  . Chest pain 05/08/2012  . Gastroparesis 05/22/2011  . Impaired glucose tolerance 05/18/2011  . Encounter for well adult exam with abnormal findings 05/18/2011  . HYPERSOMNIA 04/04/2010  . GERD 01/16/2009  . IBS 01/16/2009  . 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 ANEMIA-IRON DEFICIENCY 01/03/2007  . ANXIETY 01/03/2007  . DEPRESSION 01/03/2007    Current Outpatient Medications  Medication Sig Dispense Refill  . acetaminophen (TYLENOL) 500 MG tablet Take 1,000 mg by mouth daily as needed for moderate pain or headache.    . carisoprodol (SOMA) 350 MG tablet Take 1 tablet (350 mg total) by mouth 4 (four) times daily as needed for muscle spasms. 40 tablet 0  . esomeprazole (NEXIUM) 20 MG capsule Take 20 mg by mouth daily.     Marland Kitchen. estradiol (ESTRACE) 0.1 MG/GM vaginal cream Place 1 Applicatorful vaginally every 3 (three) days.     Marland Kitchen. estradiol (ESTRACE) 1 MG tablet Take 1 mg by mouth daily.     Marland Kitchen. losartan (COZAAR) 50 MG tablet Take 1 tablet (50 mg total) by mouth daily. New dose, d/c previous rx 90 tablet 3  . mupirocin ointment (BACTROBAN) 2 % Apply 1 application  topically 2 (two) times daily. 30 g 0  . Probiotic CAPS Take 1 capsule by mouth daily.    . promethazine (PHENERGAN) 25 MG tablet Take 25 mg by mouth daily as needed for nausea or vomiting.    . triamcinolone (NASACORT) 55 MCG/ACT AERO nasal inhaler Place 2 sprays into the nose daily. 1 Inhaler 12  . triamcinolone cream (KENALOG) 0.1 % Apply 1 application topically daily as needed. 30 g 1  . predniSONE (DELTASONE) 20 MG tablet Take 1 tablet (20 mg total) by mouth daily with breakfast. 5 tablet 0  . sulfamethoxazole-trimethoprim (BACTRIM DS) 800-160 MG tablet Take 1 tablet by mouth 2 (two) times daily. 10 tablet 0   No current facility-administered medications for this visit.     Allergies: Meperidine, Morphine, Penicillins, Lisinopril, Doxycycline, Gabapentin, Amoxicillin, Azithromycin, Codeine, Hydrocodone, Omeprazole, Oxycodone-acetaminophen, Sucralfate, and Tramadol  Past Medical History:  Diagnosis Date  . Anemia   . ANEMIA-IRON DEFICIENCY 01/03/2007  . Anxiety   . ANXIETY 01/03/2007  . Complication of anesthesia    severe post-operative nausea except for bladder surgery in 2002 at [facility]-did not have complications then  . Depression   . DEPRESSION 01/03/2007  . DJD (degenerative joint disease)    knee  . Family history of adverse reaction to anesthesia    mother and father both had severe post-operative nausea and vomiting  . GERD 01/16/2009  . GERD (gastroesophageal reflux disease)   . Headache(784.0) 01/03/2007  . Hyperplastic colon polyp    04/2010  . HYPERSOMNIA 04/04/2010  .  Hypertension   . IBS 01/16/2009  . IBS (irritable bowel syndrome)   . Impaired glucose tolerance 05/18/2011  . Menopause    early  . Migraine   . Obesity   . 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 PONV (postoperative nausea and vomiting)   . Wheezing 07/02/2010    Past Surgical History:  Procedure Laterality Date  . ABDOMINAL HYSTERECTOMY    . APPENDECTOMY    . bladder tac    .  CHOLECYSTECTOMY    . KNEE ARTHROSCOPY Bilateral   . OOPHORECTOMY      Family History  Problem Relation Age of Onset  . Hypertension Mother   . Diabetes Brother     Social History   Tobacco Use  . Smoking status: Never Smoker  . Smokeless tobacco: Never Used  Substance Use Topics  . Alcohol use: No    Alcohol/week: 0.0 standard drinks    Subjective:  2 day follow-up to re-check insect bite on left lower calf; patient notes her husband thinks the area of concern is worsening- patient cannot tell any difference; is having increased nausea and is concerned about reaction to Doxycycline; very sensitive to numerous antibiotics. No fever, no warmth or streaking noted per patient;      Objective:  Vitals:   03/12/19 1410  BP: (!) 142/86  Pulse: 95  Temp: 97.9 F (36.6 C)  TempSrc: Oral  SpO2: 98%  Weight: 238 lb 1.3 oz (108 kg)  Height: 5\' 5"  (1.651 m)    General: Well developed, well nourished, in no acute distress  Skin : Warm and dry. Localized area of circular erythematous lesion noted on left lower calf; c/w same size from 2 days ago; no warmth or streaking Head: Normocephalic and atraumatic  Lungs: Respirations unlabored;   Musculoskeletal: No deformities; no active joint inflammation  Extremities: No edema, cyanosis, clubbing  Vessels: Symmetric bilaterally  Neurologic: Alert and oriented; speech intact; face symmetrical; moves all extremities well; CNII-XII intact without focal deficit   Assessment:  1. Cellulitis of left lower extremity   2. Nausea     Plan:  Suspect reaction to the Doxycycline- hold antibiotic and will list as an allergy; start Bactrim DS and prednisone tomorrow; continue to apply Bactroban; keep leg elevated; reassurance that do not see any concerns for sepsis or systemic infection; re-check on Monday.  No follow-ups on file.  No orders of the defined types were placed in this encounter.   Requested Prescriptions   Signed Prescriptions  Disp Refills  . sulfamethoxazole-trimethoprim (BACTRIM DS) 800-160 MG tablet 10 tablet 0    Sig: Take 1 tablet by mouth 2 (two) times daily.  . predniSONE (DELTASONE) 20 MG tablet 5 tablet 0    Sig: Take 1 tablet (20 mg total) by mouth daily with breakfast.

## 2019-03-15 ENCOUNTER — Encounter: Payer: Self-pay | Admitting: Family

## 2019-03-15 ENCOUNTER — Other Ambulatory Visit: Payer: Self-pay

## 2019-03-15 ENCOUNTER — Ambulatory Visit (INDEPENDENT_AMBULATORY_CARE_PROVIDER_SITE_OTHER): Payer: 59 | Admitting: Family

## 2019-03-15 VITALS — BP 142/76 | HR 63 | Temp 98.3°F | Ht 65.0 in

## 2019-03-15 DIAGNOSIS — L03116 Cellulitis of left lower limb: Secondary | ICD-10-CM | POA: Diagnosis not present

## 2019-03-15 MED ORDER — TRIAMCINOLONE ACETONIDE 0.1 % EX CREA: 1.0000 "application " | TOPICAL_CREAM | Freq: Two times a day (BID) | CUTANEOUS | 0 refills | Status: DC

## 2019-03-15 NOTE — Progress Notes (Signed)
[name] is a 58 y.o. female with the following history as recorded in EpicCare:  Patient Active Problem List   Diagnosis Date Noted  . Acute sinus infection 05/20/2018  . Blurred vision 05/20/2018  . Allergic rhinitis 05/20/2018  . Degenerative spondylolisthesis 03/03/2018  . Preop exam for internal medicine 02/18/2018  . Shingles 02/09/2018  . 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 Tinea cruris 01/05/2018  . Contact dermatitis 12/24/2017  . Surgical menopause 03/05/2017  . Vitamin D deficiency 03/05/2017  . Dizziness 06/22/2016  . Nausea without vomiting 06/22/2016  . Hyperglycemia 02/29/2016  . Hypertriglyceridemia 02/29/2016  . Dyspnea on exertion 04/15/2015  . Essential hypertension 04/10/2015  . Delayed gastric emptying 12/16/2012  . Chronic pain 05/08/2012  . Muscle spasm of back 05/08/2012  . Chest pain 05/08/2012  . Gastroparesis 05/22/2011  . Impaired glucose tolerance 05/18/2011  . Encounter for well adult exam with abnormal findings 05/18/2011  . HYPERSOMNIA 04/04/2010  . GERD 01/16/2009  . IBS 01/16/2009  . 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 ANEMIA-IRON DEFICIENCY 01/03/2007  . ANXIETY 01/03/2007  . DEPRESSION 01/03/2007    Current Outpatient Medications  Medication Sig Dispense Refill  . acetaminophen (TYLENOL) 500 MG tablet Take 1,000 mg by mouth daily as needed for moderate pain or headache.    . carisoprodol (SOMA) 350 MG tablet Take 1 tablet (350 mg total) by mouth 4 (four) times daily as needed for muscle spasms. 40 tablet 0  . esomeprazole (NEXIUM) 20 MG capsule Take 20 mg by mouth daily.     Marland Kitchen estradiol (ESTRACE) 0.1 MG/GM vaginal cream Place 1 Applicatorful vaginally every 3 (three) days.     Marland Kitchen estradiol (ESTRACE) 1 MG tablet Take 1 mg by mouth daily.     Marland Kitchen losartan (COZAAR) 50 MG tablet Take 1 tablet (50 mg total) by mouth daily. New dose, d/c previous rx 90 tablet 3  . mupirocin ointment (BACTROBAN) 2 % Apply 1 application  topically 2 (two) times daily. 30 g 0  . predniSONE (DELTASONE) 20 MG tablet Take 1 tablet (20 mg total) by mouth daily with breakfast. 5 tablet 0  . Probiotic CAPS Take 1 capsule by mouth daily.    . promethazine (PHENERGAN) 25 MG tablet Take 25 mg by mouth daily as needed for nausea or vomiting.    . sulfamethoxazole-trimethoprim (BACTRIM DS) 800-160 MG tablet Take 1 tablet by mouth 2 (two) times daily. 10 tablet 0  . triamcinolone (NASACORT) 55 MCG/ACT AERO nasal inhaler Place 2 sprays into the nose daily. 1 Inhaler 12  . triamcinolone cream (KENALOG) 0.1 % Apply 1 application topically daily as needed. 30 g 1  . triamcinolone cream (KENALOG) 0.1 % Apply 1 application topically 2 (two) times daily. 30 g 0   No current facility-administered medications for this visit.     Allergies: Meperidine, Morphine, Penicillins, Lisinopril, Doxycycline, Gabapentin, Amoxicillin, Azithromycin, Codeine, Hydrocodone, Omeprazole, Oxycodone-acetaminophen, Sucralfate, and Tramadol  Past Medical History:  Diagnosis Date  . Anemia   . ANEMIA-IRON DEFICIENCY 01/03/2007  . Anxiety   . ANXIETY 01/03/2007  . Complication of anesthesia    severe post-operative nausea except for bladder surgery in 2002 at [facility]-did not have complications then  . Depression   . DEPRESSION 01/03/2007  . DJD (degenerative joint disease)    knee  . Family history of adverse reaction to anesthesia    mother and father both had severe post-operative nausea and vomiting  . GERD 01/16/2009  . GERD (gastroesophageal reflux disease)   .  Headache(784.0) 01/03/2007  . Hyperplastic colon polyp    04/2010  . HYPERSOMNIA 04/04/2010  . Hypertension   . IBS 01/16/2009  . IBS (irritable bowel syndrome)   . Impaired glucose tolerance 05/18/2011  . Menopause    early  . Migraine   . Obesity   . 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 PONV (postoperative nausea and vomiting)   . Wheezing 07/02/2010    Past Surgical History:   Procedure Laterality Date  . ABDOMINAL HYSTERECTOMY    . APPENDECTOMY    . bladder tac    . CHOLECYSTECTOMY    . KNEE ARTHROSCOPY Bilateral   . OOPHORECTOMY      Family History  Problem Relation Age of Onset  . Hypertension Mother   . Diabetes Brother     Social History   Tobacco Use  . Smoking status: Never Smoker  . Smokeless tobacco: Never Used  Substance Use Topics  . Alcohol use: No    Alcohol/week: 0.0 standard drinks    Subjective:  3 day follow-up on suspect spider bite on left outer lower leg; last Friday, patient was noted to be having allergic reaction to Doxycycline; since changing antibiotics, has been feeling much better and is noticing that area is starting to "itch" some and start to heal; no fever, no pain at site; tolerating new antibiotic with no difficulty;    Objective:  Vitals:   03/15/19 1425  BP: (!) 142/76  Pulse: 63  Temp: 98.3 F (36.8 C)  TempSrc: Oral  SpO2: 96%  Height: 5\' 5"  (1.651 m)    General: Well developed, well nourished, in no acute distress  Skin : Warm and dry. Circular lesion over outer left lower leg with erythematous ring/ central area does appear to be less erythematous today; no warmth or streaking noted Head: Normocephalic and atraumatic  Lungs: Respirations unlabored;  Neurologic: Alert and oriented; speech intact; face symmetrical; moves all extremities well; CNII-XII intact without focal deficit   Assessment:  1. Cellulitis of left lower extremity     Plan:  Improving; change to topical steroid cream; finish oral antibiotics and oral steroids; follow-up in 2 weeks to re-check. If area of concern has resolved completely, okay to cancel follow-up.   No follow-ups on file.  No orders of the defined types were placed in this encounter.   Requested Prescriptions   Signed Prescriptions Disp Refills  . triamcinolone cream (KENALOG) 0.1 % 30 g 0    Sig: Apply 1 application topically 2 (two) times daily.

## 2019-03-31 ENCOUNTER — Other Ambulatory Visit: Payer: Self-pay

## 2019-03-31 ENCOUNTER — Encounter: Payer: Self-pay | Admitting: Family

## 2019-03-31 ENCOUNTER — Ambulatory Visit (INDEPENDENT_AMBULATORY_CARE_PROVIDER_SITE_OTHER): Payer: 59 | Admitting: Family

## 2019-03-31 VITALS — BP 130/74 | HR 71 | Temp 97.8°F | Ht 65.0 in | Wt 236.2 lb

## 2019-03-31 DIAGNOSIS — L03116 Cellulitis of left lower limb: Secondary | ICD-10-CM | POA: Diagnosis not present

## 2019-03-31 DIAGNOSIS — Z23 Encounter for immunization: Secondary | ICD-10-CM

## 2019-03-31 NOTE — Progress Notes (Signed)
[name] is a 58 y.o. female with the following history as recorded in Epic[employer]:  Patient Active Problem List   Diagnosis Date Noted  . Acute sinus infection 05/20/2018  . Blurred vision 05/20/2018  . Allergic rhinitis 05/20/2018  . Degenerative spondylolisthesis 03/03/2018  . Preop exam for internal medicine 02/18/2018  . Shingles 02/09/2018  . Steroid-induced diabetes ([place]) 01/05/2018  . Tinea cruris 01/05/2018  . Contact dermatitis 12/24/2017  . Surgical menopause 03/05/2017  . Vitamin D deficiency 03/05/2017  . Dizziness 06/22/2016  . Nausea without vomiting 06/22/2016  . Hyperglycemia 02/29/2016  . Hypertriglyceridemia 02/29/2016  . Dyspnea on exertion 04/15/2015  . Essential hypertension 04/10/2015  . Delayed gastric emptying 12/16/2012  . Chronic pain 05/08/2012  . Muscle spasm of back 05/08/2012  . Chest pain 05/08/2012  . Gastroparesis 05/22/2011  . Impaired glucose tolerance 05/18/2011  . Encounter for well adult exam with abnormal findings 05/18/2011  . HYPERSOMNIA 04/04/2010  . GERD 01/16/2009  . IBS 01/16/2009  . 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 ANEMIA-IRON DEFICIENCY 01/03/2007  . ANXIETY 01/03/2007  . DEPRESSION 01/03/2007    Current Outpatient Medications  Medication Sig Dispense Refill  . acetaminophen (TYLENOL) 500 MG tablet Take 1,000 mg by mouth daily as needed for moderate pain or headache.    . carisoprodol (SOMA) 350 MG tablet Take 1 tablet (350 mg total) by mouth 4 (four) times daily as needed for muscle spasms. 40 tablet 0  . esomeprazole (NEXIUM) 20 MG capsule Take 20 mg by mouth daily.     Marland Kitchen estradiol (ESTRACE) 0.1 MG/GM vaginal cream Place 1 Applicatorful vaginally every 3 (three) days.     Marland Kitchen estradiol (ESTRACE) 1 MG tablet Take 1 mg by mouth daily.     Harlow Mares MAINTENANCE PACK 10 MCG INST INSERT 1 VAGINAL INSERT(S) TWICE A WEEK BY VAGINAL ROUTE.    Marland Kitchen losartan (COZAAR) 50 MG tablet Take 1 tablet (50 mg total) by mouth  daily. New dose, d/c previous rx 90 tablet 3  . mupirocin ointment (BACTROBAN) 2 % Apply 1 application topically 2 (two) times daily. 30 g 0  . predniSONE (DELTASONE) 20 MG tablet Take 1 tablet (20 mg total) by mouth daily with breakfast. 5 tablet 0  . Probiotic CAPS Take 1 capsule by mouth daily.    . promethazine (PHENERGAN) 25 MG tablet Take 25 mg by mouth daily as needed for nausea or vomiting.    . sulfamethoxazole-trimethoprim (BACTRIM DS) 800-160 MG tablet Take 1 tablet by mouth 2 (two) times daily. 10 tablet 0  . triamcinolone (NASACORT) 55 MCG/ACT AERO nasal inhaler Place 2 sprays into the nose daily. 1 Inhaler 12  . triamcinolone cream (KENALOG) 0.1 % Apply 1 application topically daily as needed. 30 g 1  . triamcinolone cream (KENALOG) 0.1 % Apply 1 application topically 2 (two) times daily. 30 g 0   No current facility-administered medications for this visit.     Allergies: Meperidine, Morphine, Penicillins, Lisinopril, Doxycycline, Gabapentin, Amoxicillin, Azithromycin, Codeine, Hydrocodone, Omeprazole, Oxycodone-acetaminophen, Sucralfate, and Tramadol  Past Medical History:  Diagnosis Date  . Anemia   . ANEMIA-IRON DEFICIENCY 01/03/2007  . Anxiety   . ANXIETY 01/03/2007  . Complication of anesthesia    severe post-operative nausea except for bladder surgery in 2002 at [place] not have complications then  . Depression   . DEPRESSION 01/03/2007  . DJD (degenerative joint disease)    knee  . Family history of adverse reaction to anesthesia    mother  and father both had severe post-operative nausea and vomiting  . GERD 01/16/2009  . GERD (gastroesophageal reflux disease)   . Headache(784.0) 01/03/2007  . Hyperplastic colon polyp    04/2010  . HYPERSOMNIA 04/04/2010  . Hypertension   . IBS 01/16/2009  . IBS (irritable bowel syndrome)   . Impaired glucose tolerance 05/18/2011  . Menopause    early  . Migraine   . Obesity   . OSTEOARTHRITIS, KNEES, BILATERAL  01/16/2009  . PONV (postoperative nausea and vomiting)   . Wheezing 07/02/2010    Past Surgical History:  Procedure Laterality Date  . ABDOMINAL HYSTERECTOMY    . APPENDECTOMY    . bladder tac    . CHOLECYSTECTOMY    . KNEE ARTHROSCOPY Bilateral   . OOPHORECTOMY      Family History  Problem Relation Age of Onset  . Hypertension Mother   . Diabetes Brother     Social History   Tobacco Use  . Smoking status: Never Smoker  . Smokeless tobacco: Never Used  Substance Use Topics  . Alcohol use: No    Alcohol/week: 0.0 standard drinks    Subjective:   2 week follow-up to re-check skin infection of loewr left leg; "much better" and has been off antibiotics for at least one week;  Would also like to get flu shot today;     Objective:  Vitals:   03/31/19 1002  BP: 130/74  Pulse: 71  Temp: 97.8 F (36.6 C)  TempSrc: Oral  SpO2: 97%  Weight: 236 lb 3.2 oz (107.1 kg)  Height: 5\' 5"  (1.651 m)    General: Well developed, well nourished, in no acute distress  Skin : Warm and dry. Healing area of cellulitis noted over lower left leg Head: Normocephalic and atraumatic  Lungs: Respirations unlabored;  Neurologic: Alert and oriented; speech intact; face symmetrical; moves all extremities well; CNII-XII intact without focal deficit   Assessment:  1. Cellulitis of left lower extremity   2. Flu vaccine need     Plan:  1. Marked improvement; healing very well- can continue alternating Bactroban and Triamcinolone; Telfa pads given as patient is having difficulty with band-aids tearing her skin; Flu shot updated;   No follow-ups on file.  Orders Placed This Encounter  Procedures  . Flu Vaccine QUAD 36+ mos IM    Requested Prescriptions    No prescriptions requested or ordered in this encounter

## 2019-04-21 ENCOUNTER — Encounter: Payer: Self-pay | Admitting: Internal Medicine

## 2019-04-21 ENCOUNTER — Other Ambulatory Visit: Payer: Self-pay

## 2019-04-21 ENCOUNTER — Ambulatory Visit (INDEPENDENT_AMBULATORY_CARE_PROVIDER_SITE_OTHER): Payer: 59 | Admitting: Internal Medicine

## 2019-04-21 DIAGNOSIS — I10 Essential (primary) hypertension: Secondary | ICD-10-CM

## 2019-04-21 DIAGNOSIS — E099 Drug or chemical induced diabetes mellitus without complications: Secondary | ICD-10-CM | POA: Diagnosis not present

## 2019-04-21 DIAGNOSIS — T783XXA Angioneurotic edema, initial encounter: Secondary | ICD-10-CM

## 2019-04-21 DIAGNOSIS — T380X5S Adverse effect of glucocorticoids and synthetic analogues, sequela: Secondary | ICD-10-CM

## 2019-04-21 MED ORDER — METHYLPREDNISOLONE ACETATE 80 MG/ML IJ SUSP
80.0000 mg | Freq: Once | INTRAMUSCULAR | Status: AC
  Administered 2019-04-21: 80 mg via INTRAMUSCULAR

## 2019-04-21 MED ORDER — PREDNISONE 10 MG PO TABS: ORAL_TABLET | ORAL | 0 refills | Status: DC

## 2019-04-21 MED ORDER — CETIRIZINE HCL 10 MG PO TABS: 10.0000 mg | ORAL_TABLET | Freq: Every day | ORAL | 11 refills | Status: DC

## 2019-04-21 NOTE — Assessment & Plan Note (Signed)
stable overall by history and exam, recent data reviewed with pt, and pt to continue medical treatment as before,  to f/u any worsening symptoms or concerns  

## 2019-04-21 NOTE — Patient Instructions (Signed)
You had the steroid shot today  Please take all new medication as prescribed - the prednisone, and the zyrtec  Please continue all other medications as before, and refills have been done if requested.  Please have the pharmacy call with any other refills you may need.  Please continue your efforts at being more active, low cholesterol diet, and weight control.  Please keep your appointments with your specialists as you may have planned

## 2019-04-21 NOTE — Assessment & Plan Note (Signed)
Likely due to yesterday environmental exposure to some kind of antigen likely plant based; no s/s cellulitis or abscess; ok for depomedrol IM 80, predpac asd, and zyrtec asd,  to f/u any worsening symptoms or concerns

## 2019-04-21 NOTE — Progress Notes (Signed)
Subjective:    Patient ID: [name], female    DOB: 04/17/1961, 58 y.o.   MRN: [id number]  HPI  Here with sudden onset last PM of diffuse facial doughy erythem swelling left > right with the left side involving the neck as well, without fever, trauma or even tender to touch;  Seemed to start after working in the yard yesterday, in particularly exposed to crepe myrtle.  Seems to recall a minor reaction before as well. Has not thought to try zyrtec and benadryl just "wires me up."  No vision, smell, hearing, throat or tongue swelling.  Pt denies chest pain, increased sob or doe, wheezing, orthopnea, PND, increased LE swelling, palpitations, dizziness or syncope.   Pt denies polydipsia, polyuria Past Medical History:  Diagnosis Date  . Anemia   . ANEMIA-IRON DEFICIENCY 01/03/2007  . Anxiety   . ANXIETY 01/03/2007  . Complication of anesthesia    severe post-operative nausea except for bladder surgery in 2002 at [place] not have complications then  . Depression   . DEPRESSION 01/03/2007  . DJD (degenerative joint disease)    knee  . Family history of adverse reaction to anesthesia    mother and father both had severe post-operative nausea and vomiting  . GERD 01/16/2009  . GERD (gastroesophageal reflux disease)   . Headache(784.0) 01/03/2007  . Hyperplastic colon polyp    04/2010  . HYPERSOMNIA 04/04/2010  . Hypertension   . IBS 01/16/2009  . IBS (irritable bowel syndrome)   . Impaired glucose tolerance 05/18/2011  . Menopause    early  . Migraine   . Obesity   . 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 PONV (postoperative nausea and vomiting)   . Wheezing 07/02/2010   Past Surgical History:  Procedure Laterality Date  . ABDOMINAL HYSTERECTOMY    . APPENDECTOMY    . bladder tac    . CHOLECYSTECTOMY    . KNEE ARTHROSCOPY Bilateral   . OOPHORECTOMY      reports that she has never smoked. She has never used smokeless tobacco. She reports that she does not drink  alcohol or use drugs. family history includes Diabetes in her brother; Hypertension in her mother. Allergies  Allergen Reactions  . Meperidine Shortness Of Breath  . Morphine Anaphylaxis  . Penicillins Hives, Nausea Only, Swelling and Other (See Comments)    PATIENT HAS HAD A PCN REACTION WITH IMMEDIATE RASH, FACIAL/TONGUE/THROAT SWELLING, SOB, OR LIGHTHEADEDNESS WITH HYPOTENSION:  #  #  YES  #  #  HAS PT DEVELOPED SEVERE RASH INVOLVING MUCUS MEMBRANES or SKIN NECROSIS: #  #  YES  #  #  PATIENT HAS HAD A PCN REACTION THAT REQUIRED HOSPITALIZATION:  #  #  YES  #  #  Has patient had a PCN reaction occurring within the last 10 years: No   . Lisinopril Cough       . Doxycycline     nausea  . Gabapentin Other (See Comments)    Muscle pain, fatigue  . Amoxicillin Nausea Only  . Azithromycin Nausea And Vomiting  . Codeine Nausea Only and Other (See Comments)    Hot flashes also  . Hydrocodone Itching, Nausea Only and Rash  . Omeprazole Itching and Rash  . Oxycodone-Acetaminophen Nausea And Vomiting and Rash  . Sucralfate Rash and Other (See Comments)    Blurry vision   . Tramadol Itching, Nausea Only and Rash   Current Outpatient Medications on File Prior to Visit  Medication Sig  Dispense Refill  . acetaminophen (TYLENOL) 500 MG tablet Take 1,000 mg by mouth daily as needed for moderate pain or headache.    . carisoprodol (SOMA) 350 MG tablet Take 1 tablet (350 mg total) by mouth 4 (four) times daily as needed for muscle spasms. 40 tablet 0  . esomeprazole (NEXIUM) 20 MG capsule Take 20 mg by mouth daily.     Marland Kitchen. estradiol (ESTRACE) 0.1 MG/GM vaginal cream Place 1 Applicatorful vaginally every 3 (three) days.     Marland Kitchen. estradiol (ESTRACE) 1 MG tablet Take 1 mg by mouth daily.     Creig Hines. IMVEXXY MAINTENANCE PACK 10 MCG INST INSERT 1 VAGINAL INSERT(S) TWICE A WEEK BY VAGINAL ROUTE.    Marland Kitchen. losartan (COZAAR) 50 MG tablet Take 1 tablet (50 mg total) by mouth daily. New dose, d/c previous rx 90 tablet 3   . mupirocin ointment (BACTROBAN) 2 % Apply 1 application topically 2 (two) times daily. 30 g 0  . Probiotic CAPS Take 1 capsule by mouth daily.    . promethazine (PHENERGAN) 25 MG tablet Take 25 mg by mouth daily as needed for nausea or vomiting.    . sulfamethoxazole-trimethoprim (BACTRIM DS) 800-160 MG tablet Take 1 tablet by mouth 2 (two) times daily. 10 tablet 0  . triamcinolone (NASACORT) 55 MCG/ACT AERO nasal inhaler Place 2 sprays into the nose daily. 1 Inhaler 12  . triamcinolone cream (KENALOG) 0.1 % Apply 1 application topically daily as needed. 30 g 1  . triamcinolone cream (KENALOG) 0.1 % Apply 1 application topically 2 (two) times daily. 30 g 0   No current facility-administered medications on file prior to visit.    Review of Systems  Constitutional: Negative for other unusual diaphoresis or sweats HENT: Negative for ear discharge or swelling Eyes: Negative for other worsening visual disturbances Respiratory: Negative for stridor or other swelling  Gastrointestinal: Negative for worsening distension or other blood Genitourinary: Negative for retention or other urinary change Musculoskeletal: Negative for other MSK pain or swelling Skin: Negative for color change or other new lesions Neurological: Negative for worsening tremors and other numbness  Psychiatric/Behavioral: Negative for worsening agitation or other fatigue All otherwise neg per pt     Objective:   Physical Exam BP 134/82   Pulse 83   Temp 97.8 F (36.6 C) (Oral)   Ht 5\' 5"  (1.651 m)   Wt 236 lb (107 kg)   SpO2 98%   BMI 39.27 kg/m  VS noted,  Constitutional: Pt appears in NAD HENT: Head: NCAT.  Fairly dramatic diffuse non tender angioedematous bilat facies left > right with swelling to left neck as well, and forehead with non tender erythema only Right Ear: External ear normal.  Left Ear: External ear normal.  Eyes: . Pupils are equal, round, and reactive to light. Conjunctivae and EOM are normal  Nose: without d/c or deformity Neck: Neck supple. Gross normal ROM Cardiovascular: Normal rate and regular rhythm.   Pulmonary/Chest: Effort normal and breath sounds without rales or wheezing.  Neurological: Pt is alert. At baseline orientation, motor grossly intact Skin: Skin is warm. No other new lesions, no LE edema Psychiatric: Pt behavior is normal without agitation  All otherwise neg per pt Lab Results  Component Value Date   WBC 5.8 05/20/2018   HGB 13.4 05/20/2018   HCT 40.5 05/20/2018   PLT 243.0 05/20/2018   GLUCOSE 181 (H) 05/20/2018   CHOL 181 05/20/2018   TRIG (H) 05/20/2018    401.0 Triglyceride is over  400; calculations on Lipids are invalid.   HDL 46.90 05/20/2018   LDLDIRECT 101.0 05/20/2018   LDLCALC 96 04/02/2010   ALT 15 05/20/2018   AST 15 05/20/2018   NA 137 05/20/2018   K 3.6 05/20/2018   CL 103 05/20/2018   CREATININE 0.75 05/20/2018   BUN 10 05/20/2018   CO2 24 05/20/2018   TSH 1.44 05/20/2018   HGBA1C 6.6 (H) 05/20/2018        Assessment & Plan:

## 2019-04-22 ENCOUNTER — Ambulatory Visit: Payer: 59 | Admitting: Sports Medicine

## 2019-04-22 VITALS — BP 138/80 | Ht 65.0 in | Wt 236.0 lb

## 2019-04-22 DIAGNOSIS — M170 Bilateral primary osteoarthritis of knee: Secondary | ICD-10-CM

## 2019-04-22 NOTE — Progress Notes (Signed)
   Subjective:    Patient ID: [name], female    DOB: [birth year], 59 y.o.   MRN: [id number]  HPI chief complaint: Bilateral knee pain  Patient comes in today requesting repeat cortisone injections into each knee.  She has a well-documented history of end-stage DJD in both knees.  She is planning on having total knee arthroplasty down the road but she is currently taking care of her husband who is getting ready to undergo surgery for prostate cancer.  Right knee was last injected in June.  Each injection provides her with several weeks of relief.  She tells me that yesterday she was seen by her PCP for some swelling and a rash on her face which was contributed to allergies.  She was given an IM steroid injection and placed on oral prednisone which she started today.  Her oral prednisone prescription is a 9-day taper.  In regards to her knee pain, it is identical in nature to what she has experienced previously with her arthritis.  She denies any recent trauma.  Interim medical history reviewed Medications reviewed Allergies reviewed  Review of Systems    As above  Objective:   Physical Exam  Well-developed, well-nourished.  No acute distress.  Awake alert and oriented x3.  Vital signs reviewed.  Each knee shows range of motion 0 to 120 degrees.  No obvious effusion.  Good joint stability.  Patient walks with a slightly antalgic gait      Assessment & Plan:   Returning bilateral knee pain secondary to DJD  Since the patient has started a 9-day Sterapred Dosepak, today I recommended that we wait on bilateral cortisone injections for her knee for the time being.  I explained to her that she may notice the oral prednisone helps with her knee pain.  We will plan on rescheduling her visit for 1 week from today.  If she does not notice any benefit from that then we can consider injections at that time.  She understands that definitive treatment is total knee arthroplasty and she is  planning on having that done after her husband's prostate surgery.

## 2019-04-29 ENCOUNTER — Other Ambulatory Visit: Payer: Self-pay

## 2019-04-29 ENCOUNTER — Ambulatory Visit (INDEPENDENT_AMBULATORY_CARE_PROVIDER_SITE_OTHER): Payer: 59 | Admitting: Sports Medicine

## 2019-04-29 VITALS — BP 142/75 | Ht 65.0 in | Wt 236.0 lb

## 2019-04-29 DIAGNOSIS — M170 Bilateral primary osteoarthritis of knee: Secondary | ICD-10-CM

## 2019-04-29 MED ORDER — METHYLPREDNISOLONE ACETATE 40 MG/ML IJ SUSP
40.0000 mg | Freq: Once | INTRAMUSCULAR | Status: AC
  Administered 2019-04-29: 40 mg via INTRA_ARTICULAR

## 2019-04-29 NOTE — Progress Notes (Signed)
  [name] returns today for bilateral cortisone injections into each knee.  The oral prednisone that she was on last week for an allergic reaction did not help her knee pain.  Each of her knees were injected today with cortisone.  An anterior lateral approach was utilized.  She tolerates these without difficulty.  She understands that total knee arthroplasty is definitive treatment.  Follow-up as needed.  Consent obtained and verified. Time-out conducted. Noted no overlying erythema, induration, or other signs of local infection. Skin prepped in a sterile fashion. Topical analgesic spray: Ethyl chloride. Joint: right knee Needle: 25g 1.5 inch Completed without difficulty. Meds: 3cc 1% xylocaine, 1cc (40mg ) depomedrol  Advised to call if fevers/chills, erythema, induration, drainage, or persistent bleeding.  Consent obtained and verified. Time-out conducted. Noted no overlying erythema, induration, or other signs of local infection. Skin prepped in a sterile fashion. Topical analgesic spray: Ethyl chloride. Joint: left knee Needle: 25g 1.5 inch Completed without difficulty. Meds: 3cc 1% xylocaine, 1cc (40mg ) depomedrol  Advised to call if fevers/chills, erythema, induration, drainage, or persistent bleeding.

## 2019-05-03 ENCOUNTER — Ambulatory Visit: Payer: 59 | Admitting: Cardiovascular Disease

## 2019-05-25 ENCOUNTER — Encounter: Payer: Self-pay | Admitting: Internal Medicine

## 2019-05-25 ENCOUNTER — Ambulatory Visit (INDEPENDENT_AMBULATORY_CARE_PROVIDER_SITE_OTHER): Payer: 59 | Admitting: Internal Medicine

## 2019-05-25 ENCOUNTER — Other Ambulatory Visit: Payer: Self-pay

## 2019-05-25 VITALS — BP 140/80 | HR 71 | Temp 97.5°F | Ht 65.0 in | Wt 236.0 lb

## 2019-05-25 DIAGNOSIS — E538 Deficiency of other specified B group vitamins: Secondary | ICD-10-CM | POA: Diagnosis not present

## 2019-05-25 DIAGNOSIS — Z23 Encounter for immunization: Secondary | ICD-10-CM

## 2019-05-25 DIAGNOSIS — E611 Iron deficiency: Secondary | ICD-10-CM

## 2019-05-25 DIAGNOSIS — R739 Hyperglycemia, unspecified: Secondary | ICD-10-CM | POA: Diagnosis not present

## 2019-05-25 DIAGNOSIS — Z0000 Encounter for general adult medical examination without abnormal findings: Secondary | ICD-10-CM | POA: Diagnosis not present

## 2019-05-25 DIAGNOSIS — E559 Vitamin D deficiency, unspecified: Secondary | ICD-10-CM

## 2019-05-25 NOTE — Patient Instructions (Addendum)
You had the Prevnar 13 pneumonia shot today  Please continue all other medications as before, and refills have been done if requested.  Please have the pharmacy call with any other refills you may need.  Please continue your efforts at being more active, low cholesterol diet, and weight control.  You are otherwise up to date with prevention measures today.  Please keep your appointments with your specialists as you may have planned  Please go to the LAB in the Basement (turn left off the elevator) for the tests to be done today  You will be contacted by phone if any changes need to be made immediately.  Otherwise, you will receive a letter about your results with an explanation, but please check with MyChart first.  Please remember to sign up for MyChart if you have not done so, as this will be important to you in the future with finding out test results, communicating by private email, and scheduling acute appointments online when needed.  Please return in 1 year for your yearly visit, or sooner if needed, with Lab testing done 3-5 days before   

## 2019-05-25 NOTE — Progress Notes (Signed)
Subjective:    Patient ID: [name], female    DOB: April 24, 1961, 58 y.o.   MRN: [id number]  HPI    Here for wellness and f/u;  Overall doing ok;  Pt denies Chest pain, worsening SOB, DOE, wheezing, orthopnea, PND, worsening LE edema, palpitations, dizziness or syncope.  Pt denies neurological change such as new headache, facial or extremity weakness.  Pt denies polydipsia, polyuria, or low sugar symptoms. Pt states overall good compliance with treatment and medications, good tolerability, and has been trying to follow appropriate diet.  Pt denies worsening depressive symptoms, suicidal ideation or panic. No fever, night sweats, wt loss, loss of appetite, or other constitutional symptoms.  Pt states good ability with ADL's, has low fall risk, home safety reviewed and adequate, no other significant changes in hearing or vision, and only occasionally active with exercise. No new complaints Wt Readings from Last 3 Encounters:  05/25/19 236 lb (107 kg)  04/29/19 236 lb (107 kg)  04/22/19 236 lb (107 kg)   BP Readings from Last 3 Encounters:  05/25/19 140/80  04/29/19 (!) 142/75  04/22/19 138/80  may need further lumbar surgury after cage placement l4-5 per Dr Trenton Gammon.   Still has persistent swelling diffuslly to left neck and usbmandibular, but no pain and not any worse.  Past Medical History:  Diagnosis Date  . Anemia   . ANEMIA-IRON DEFICIENCY 01/03/2007  . Anxiety   . ANXIETY 01/03/2007  . Complication of anesthesia    severe post-operative nausea except for bladder surgery in 2002 at [place] not have complications then  . Depression   . DEPRESSION 01/03/2007  . DJD (degenerative joint disease)    knee  . Family history of adverse reaction to anesthesia    mother and father both had severe post-operative nausea and vomiting  . GERD 01/16/2009  . GERD (gastroesophageal reflux disease)   . Headache(784.0) 01/03/2007  . Hyperplastic colon polyp    04/2010  . HYPERSOMNIA  04/04/2010  . Hypertension   . IBS 01/16/2009  . IBS (irritable bowel syndrome)   . Impaired glucose tolerance 05/18/2011  . Menopause    early  . Migraine   . Obesity   . 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 PONV (postoperative nausea and vomiting)   . Wheezing 07/02/2010   Past Surgical History:  Procedure Laterality Date  . ABDOMINAL HYSTERECTOMY    . APPENDECTOMY    . bladder tac    . CHOLECYSTECTOMY    . KNEE ARTHROSCOPY Bilateral   . OOPHORECTOMY      reports that she has never smoked. She has never used smokeless tobacco. She reports that she does not drink alcohol or use drugs. family history includes Diabetes in her brother; Hypertension in her mother. Allergies  Allergen Reactions  . Meperidine Shortness Of Breath  . Morphine Anaphylaxis  . Penicillins Hives, Nausea Only, Swelling and Other (See Comments)    PATIENT HAS HAD A PCN REACTION WITH IMMEDIATE RASH, FACIAL/TONGUE/THROAT SWELLING, SOB, OR LIGHTHEADEDNESS WITH HYPOTENSION:  #  #  YES  #  #  HAS PT DEVELOPED SEVERE RASH INVOLVING MUCUS MEMBRANES or SKIN NECROSIS: #  #  YES  #  #  PATIENT HAS HAD A PCN REACTION THAT REQUIRED HOSPITALIZATION:  #  #  YES  #  #  Has patient had a PCN reaction occurring within the last 10 years: No   . Lisinopril Cough       . Doxycycline  nausea  . Gabapentin Other (See Comments)    Muscle pain, fatigue  . Amoxicillin Nausea Only  . Azithromycin Nausea And Vomiting  . Codeine Nausea Only and Other (See Comments)    Hot flashes also  . Hydrocodone Itching, Nausea Only and Rash  . Omeprazole Itching and Rash  . Oxycodone-Acetaminophen Nausea And Vomiting and Rash  . Sucralfate Rash and Other (See Comments)    Blurry vision   . Tramadol Itching, Nausea Only and Rash   Current Outpatient Medications on File Prior to Visit  Medication Sig Dispense Refill  . acetaminophen (TYLENOL) 500 MG tablet Take 1,000 mg by mouth daily as needed for moderate pain or headache.     . carisoprodol (SOMA) 350 MG tablet Take 1 tablet (350 mg total) by mouth 4 (four) times daily as needed for muscle spasms. 40 tablet 0  . cetirizine (ZYRTEC) 10 MG tablet Take 1 tablet (10 mg total) by mouth daily. 30 tablet 11  . esomeprazole (NEXIUM) 20 MG capsule Take 20 mg by mouth daily.     Marland Kitchen. estradiol (ESTRACE) 1 MG tablet Take 1 mg by mouth daily.     Creig Hines. IMVEXXY MAINTENANCE PACK 10 MCG INST INSERT 1 VAGINAL INSERT(S) TWICE A WEEK BY VAGINAL ROUTE.    Marland Kitchen. losartan (COZAAR) 50 MG tablet Take 1 tablet (50 mg total) by mouth daily. New dose, d/c previous rx 90 tablet 3  . mupirocin ointment (BACTROBAN) 2 % Apply 1 application topically 2 (two) times daily. 30 g 0  . Probiotic CAPS Take 1 capsule by mouth daily.    . promethazine (PHENERGAN) 25 MG tablet Take 25 mg by mouth daily as needed for nausea or vomiting.    . sulfamethoxazole-trimethoprim (BACTRIM DS) 800-160 MG tablet Take 1 tablet by mouth 2 (two) times daily. 10 tablet 0  . triamcinolone (NASACORT) 55 MCG/ACT AERO nasal inhaler Place 2 sprays into the nose daily. 1 Inhaler 12  . triamcinolone cream (KENALOG) 0.1 % Apply 1 application topically daily as needed. 30 g 1  . triamcinolone cream (KENALOG) 0.1 % Apply 1 application topically 2 (two) times daily. 30 g 0   No current facility-administered medications on file prior to visit.    Review of Systems Constitutional: Negative for other unusual diaphoresis, sweats, appetite or weight changes HENT: Negative for other worsening hearing loss, ear pain, facial swelling, mouth sores or neck stiffness.   Eyes: Negative for other worsening pain, redness or other visual disturbance.  Respiratory: Negative for other stridor or swelling Cardiovascular: Negative for other palpitations or other chest pain  Gastrointestinal: Negative for worsening diarrhea or loose stools, blood in stool, distention or other pain Genitourinary: Negative for hematuria, flank pain or other change in urine  volume.  Musculoskeletal: Negative for myalgias or other joint swelling.  Skin: Negative for other color change, or other wound or worsening drainage.  Neurological: Negative for other syncope or numbness. Hematological: Negative for other adenopathy or swelling Psychiatric/Behavioral: Negative for hallucinations, other worsening agitation, SI, self-injury, or new decreased concentration All otherwise neg per pt    Objective:   Physical Exam BP 140/80 (BP Location: Left Arm, Patient Position: Sitting, Cuff Size: Large)   Pulse 71   Temp (!) 97.5 F (36.4 C) (Oral)   Ht 5\' 5"  (1.651 m)   Wt 236 lb (107 kg)   SpO2 97%   BMI 39.27 kg/m  VS noted,  Constitutional: Pt is oriented to person, place, and time. Appears well-developed and well-nourished, in  no significant distress and comfortable Head: Normocephalic and atraumatic  Eyes: Conjunctivae and EOM are normal. Pupils are equal, round, and reactive to light Right Ear: External ear normal without discharge Left Ear: External ear normal without discharge Nose: Nose without discharge or deformity Mouth/Throat: Oropharynx is without other ulcerations and moist  Neck: Normal range of motion. Neck supple. No JVD present. No tracheal deviation present or significant neck LA or mass Cardiovascular: Normal rate, regular rhythm, normal heart sounds and intact distal pulses.   Pulmonary/Chest: WOB normal and breath sounds without rales or wheezing  Abdominal: Soft. Bowel sounds are normal. NT. No HSM  Musculoskeletal: Normal range of motion. Exhibits no edema Lymphadenopathy: Has no other cervical adenopathy.  Neurological: Pt is alert and oriented to person, place, and time. Pt has normal reflexes. No cranial nerve deficit. Motor grossly intact, Gait intact Skin: Skin is warm and dry. No rash noted or new ulcerations Psychiatric:  Has normal mood and affect. Behavior is normal without agitation All otherwise neg per pt  Lab Results   Component Value Date   WBC 5.8 05/20/2018   HGB 13.4 05/20/2018   HCT 40.5 05/20/2018   PLT 243.0 05/20/2018   GLUCOSE 181 (H) 05/20/2018   CHOL 181 05/20/2018   TRIG (H) 05/20/2018    401.0 Triglyceride is over 400; calculations on Lipids are invalid.   HDL 46.90 05/20/2018   LDLDIRECT 101.0 05/20/2018   LDLCALC 96 04/02/2010   ALT 15 05/20/2018   AST 15 05/20/2018   NA 137 05/20/2018   K 3.6 05/20/2018   CL 103 05/20/2018   CREATININE 0.75 05/20/2018   BUN 10 05/20/2018   CO2 24 05/20/2018   TSH 1.44 05/20/2018   HGBA1C 6.6 (H) 05/20/2018       Assessment & Plan:

## 2019-05-29 ENCOUNTER — Encounter: Payer: Self-pay | Admitting: Internal Medicine

## 2019-05-29 NOTE — Assessment & Plan Note (Signed)
For oral replacement 

## 2019-05-29 NOTE — Assessment & Plan Note (Signed)

## 2019-05-29 NOTE — Assessment & Plan Note (Signed)
stable overall by history and exam, recent data reviewed with pt, and pt to continue medical treatment as before,  to f/u any worsening symptoms or concerns  

## 2019-06-14 ENCOUNTER — Telehealth: Payer: Self-pay

## 2019-06-14 MED ORDER — CYCLOBENZAPRINE HCL 10 MG PO TABS: ORAL_TABLET | ORAL | 0 refills | Status: DC

## 2019-06-14 NOTE — Telephone Encounter (Signed)
Per Dr. Micheline Chapman will try Flexeril 10 mg BID PRN. Rx sent to pharmacy. Pt is aware.

## 2019-07-06 ENCOUNTER — Ambulatory Visit: Payer: 59 | Admitting: Cardiovascular Disease

## 2019-07-13 ENCOUNTER — Other Ambulatory Visit: Payer: Self-pay | Admitting: Family

## 2019-09-02 ENCOUNTER — Ambulatory Visit: Payer: 59 | Attending: Family

## 2019-09-02 DIAGNOSIS — Z23 Encounter for immunization: Secondary | ICD-10-CM

## 2019-09-02 NOTE — Progress Notes (Signed)
   Covid-19 Vaccination Clinic  Name:  AMATULLAH CHRISTY    MRN: [id number] DOB: [birth year]  09/02/2019  Ms. Gombert was observed post Covid-19 immunization for 15 minutes without incident. She was provided with Vaccine Information Sheet and instruction to access the V-Safe system.   Ms. Roedel was instructed to call 911 with any severe reactions post vaccine: Marland Kitchen Difficulty breathing  . Swelling of face and throat  . A fast heartbeat  . A bad rash all over body  . Dizziness and weakness   Immunizations Administered    Name Date Dose VIS Date Route   Moderna COVID-19 Vaccine 09/02/2019  1:42 PM 0.5 mL 05/25/2019 Intramuscular   Manufacturer: Moderna   Lot: [id number]   NDC: 91916-606-00

## 2019-09-15 ENCOUNTER — Other Ambulatory Visit (INDEPENDENT_AMBULATORY_CARE_PROVIDER_SITE_OTHER): Payer: 59

## 2019-09-15 DIAGNOSIS — R739 Hyperglycemia, unspecified: Secondary | ICD-10-CM | POA: Diagnosis not present

## 2019-09-15 DIAGNOSIS — E559 Vitamin D deficiency, unspecified: Secondary | ICD-10-CM

## 2019-09-15 DIAGNOSIS — E611 Iron deficiency: Secondary | ICD-10-CM

## 2019-09-15 DIAGNOSIS — E538 Deficiency of other specified B group vitamins: Secondary | ICD-10-CM | POA: Diagnosis not present

## 2019-09-15 DIAGNOSIS — Z0000 Encounter for general adult medical examination without abnormal findings: Secondary | ICD-10-CM | POA: Diagnosis not present

## 2019-09-15 LAB — LIPID PANEL
Cholesterol: 180 mg/dL (ref 0–200)
HDL: 45.2 mg/dL (ref >39.00)
NonHDL: 134.61
Total CHOL/HDL Ratio: 4
Triglycerides: 318 mg/dL — ABNORMAL HIGH (ref 0.0–149.0)
VLDL: 63.6 mg/dL — ABNORMAL HIGH (ref 0.0–40.0)

## 2019-09-15 LAB — URINALYSIS, ROUTINE W REFLEX MICROSCOPIC
Bilirubin Urine: NEGATIVE
Hgb urine dipstick: NEGATIVE
Ketones, ur: NEGATIVE
Leukocytes,Ua: NEGATIVE
Nitrite: NEGATIVE
RBC / HPF: NONE SEEN (ref 0–?)
Specific Gravity, Urine: 1.015 (ref 1.000–1.030)
Total Protein, Urine: NEGATIVE
Urine Glucose: NEGATIVE
Urobilinogen, UA: 0.2 (ref 0.0–1.0)
pH: 8.5 — AB (ref 5.0–8.0)

## 2019-09-15 LAB — BASIC METABOLIC PANEL
BUN: 13 mg/dL (ref 6–23)
CO2: 24 mEq/L (ref 19–32)
Calcium: 9.2 mg/dL (ref 8.4–10.5)
Chloride: 103 mEq/L (ref 96–112)
Creatinine, Ser: 0.73 mg/dL (ref 0.40–1.20)
GFR: 81.33 mL/min (ref >60.00)
Glucose, Bld: 122 mg/dL — ABNORMAL HIGH (ref 70–99)
Potassium: 4.1 mEq/L (ref 3.5–5.1)
Sodium: 137 mEq/L (ref 135–145)

## 2019-09-15 LAB — HEPATIC FUNCTION PANEL
ALT: 12 U/L (ref 0–35)
AST: 13 U/L (ref 0–37)
Albumin: 4.1 g/dL (ref 3.5–5.2)
Alkaline Phosphatase: 54 U/L (ref 39–117)
Bilirubin, Direct: 0.1 mg/dL (ref 0.0–0.3)
Total Bilirubin: 0.3 mg/dL (ref 0.2–1.2)
Total Protein: 7.1 g/dL (ref 6.0–8.3)

## 2019-09-15 LAB — IBC PANEL
Iron: 73 ug/dL (ref 42–145)
Saturation Ratios: 18.6 % — ABNORMAL LOW (ref 20.0–50.0)
Transferrin: 281 mg/dL (ref 212.0–360.0)

## 2019-09-15 LAB — CBC WITH DIFFERENTIAL/PLATELET
Basophils Absolute: 0.1 10*3/uL (ref 0.0–0.1)
Basophils Relative: 1 % (ref 0.0–3.0)
Eosinophils Absolute: 0.2 10*3/uL (ref 0.0–0.7)
Eosinophils Relative: 3.1 % (ref 0.0–5.0)
HCT: 40.4 % (ref 36.0–46.0)
Hemoglobin: 13.5 g/dL (ref 12.0–15.0)
Lymphocytes Relative: 38 % (ref 12.0–46.0)
Lymphs Abs: 2.4 10*3/uL (ref 0.7–4.0)
MCHC: 33.4 g/dL (ref 30.0–36.0)
MCV: 87.9 fl (ref 78.0–100.0)
Monocytes Absolute: 0.4 10*3/uL (ref 0.1–1.0)
Monocytes Relative: 6.8 % (ref 3.0–12.0)
Neutro Abs: 3.3 10*3/uL (ref 1.4–7.7)
Neutrophils Relative %: 51.1 % (ref 43.0–77.0)
Platelets: 209 10*3/uL (ref 150.0–400.0)
RBC: 4.59 Mil/uL (ref 3.87–5.11)
RDW: 13.1 % (ref 11.5–15.5)
WBC: 6.4 10*3/uL (ref 4.0–10.5)

## 2019-09-15 LAB — MICROALBUMIN / CREATININE URINE RATIO
Creatinine,U: 86.9 mg/dL
Microalb Creat Ratio: 0.8 mg/g (ref 0.0–30.0)
Microalb, Ur: <0.7 mg/dL (ref 0.0–1.9)

## 2019-09-15 LAB — VITAMIN D 25 HYDROXY (VIT D DEFICIENCY, FRACTURES): VITD: 25.14 ng/mL — ABNORMAL LOW (ref 30.00–100.00)

## 2019-09-15 LAB — TSH: TSH: 1.45 u[IU]/mL (ref 0.35–4.50)

## 2019-09-15 LAB — LDL CHOLESTEROL, DIRECT: Direct LDL: 97 mg/dL

## 2019-09-15 LAB — HEMOGLOBIN A1C: Hgb A1c MFr Bld: 6.2 % (ref 4.6–6.5)

## 2019-09-15 LAB — VITAMIN B12: Vitamin B-12: 256 pg/mL (ref 211–911)

## 2019-09-22 ENCOUNTER — Other Ambulatory Visit: Payer: Self-pay

## 2019-09-22 ENCOUNTER — Encounter: Payer: Self-pay | Admitting: Internal Medicine

## 2019-09-22 ENCOUNTER — Ambulatory Visit: Payer: 59 | Admitting: Internal Medicine

## 2019-09-22 VITALS — BP 130/76 | HR 62 | Temp 98.1°F | Ht 65.0 in | Wt 227.0 lb

## 2019-09-22 DIAGNOSIS — E559 Vitamin D deficiency, unspecified: Secondary | ICD-10-CM

## 2019-09-22 DIAGNOSIS — M659 Synovitis and tenosynovitis, unspecified: Secondary | ICD-10-CM | POA: Diagnosis not present

## 2019-09-22 DIAGNOSIS — T783XXD Angioneurotic edema, subsequent encounter: Secondary | ICD-10-CM

## 2019-09-22 LAB — COMPREHENSIVE METABOLIC PANEL
ALT: 11 IU/L (ref 0–32)
AST: 17 IU/L (ref 0–40)
Albumin/Globulin Ratio: 1.7 (ref 1.2–2.2)
Albumin: 4.5 g/dL (ref 3.8–4.9)
Alkaline Phosphatase: 68 IU/L (ref 39–117)
BUN/Creatinine Ratio: 12 (ref 9–23)
BUN: 9 mg/dL (ref 6–24)
Bilirubin Total: 0.3 mg/dL (ref 0.0–1.2)
CO2: 23 mmol/L (ref 20–29)
Calcium: 9.6 mg/dL (ref 8.7–10.2)
Chloride: 102 mmol/L (ref 96–106)
Creatinine, Ser: 0.74 mg/dL (ref 0.57–1.00)
GFR calc Af Amer: 103 mL/min/{1.73_m2} (ref >59)
GFR calc non Af Amer: 89 mL/min/{1.73_m2} (ref >59)
Globulin, Total: 2.7 g/dL (ref 1.5–4.5)
Glucose: 114 mg/dL — ABNORMAL HIGH (ref 65–99)
Potassium: 4.1 mmol/L (ref 3.5–5.2)
Sodium: 139 mmol/L (ref 134–144)
Total Protein: 7.2 g/dL (ref 6.0–8.5)

## 2019-09-22 LAB — LIPID PANEL
Chol/HDL Ratio: 3.6 ratio (ref 0.0–4.4)
Cholesterol, Total: 193 mg/dL (ref 100–199)
HDL: 54 mg/dL (ref >39)
LDL Chol Calc (NIH): 103 mg/dL — ABNORMAL HIGH (ref 0–99)
Triglycerides: 213 mg/dL — ABNORMAL HIGH (ref 0–149)
VLDL Cholesterol Cal: 36 mg/dL (ref 5–40)

## 2019-09-22 MED ORDER — PREDNISONE 10 MG PO TABS: ORAL_TABLET | ORAL | 0 refills | Status: DC

## 2019-09-22 MED ORDER — VITAMIN D (ERGOCALCIFEROL) 1.25 MG (50000 UNIT) PO CAPS: 50000.0000 [IU] | ORAL_CAPSULE | ORAL | 0 refills | Status: DC

## 2019-09-22 MED ORDER — TRIAMCINOLONE ACETONIDE 0.1 % EX CREA: 1.0000 "application " | TOPICAL_CREAM | Freq: Every day | CUTANEOUS | 1 refills | Status: DC | PRN

## 2019-09-22 NOTE — Patient Instructions (Addendum)
Please take all new medication as prescribed - the prednisone, and topical triamcinolone steroid cream if needed  Please change the surgical mask to a different kind of cloth mask, to avoid further possible allergy reaction  Please Dr Margaretha Sheffield for the left thumb tendonitis treatment (possible cortisone shot)  Please take Vitamin D 38381 units weekly for 12 weeks, then plan to change to OTC Vitamin D3 at 3000 units per day, indefinitely.  Please continue all other medications as before, and refills have been done if requested.  Please have the pharmacy call with any other refills you may need.  Please continue your efforts at being more active, low cholesterol diet, and weight control.  Please keep your appointments with your specialists as you may have planned

## 2019-09-22 NOTE — Assessment & Plan Note (Addendum)
Recurrent, suspect related to surgical mask wearing - for predpac asd, and change surgical mask to cloth mask  I spent 31 minutes in preparing to see the patient by review of recent labs, imaging and procedures, obtaining and reviewing separately obtained history, communicating with the patient and family or caregiver, ordering medications, tests or procedures, and documenting clinical information in the EHR including the differential Dx, treatment, and any further evaluation and other management of facial rash angioedema like, left thumb tendonitis, low vit d

## 2019-09-22 NOTE — Assessment & Plan Note (Signed)
Mild to mod, Please take Vitamin D 74715 units weekly for 12 weeks, then plan to change to OTC Vitamin D3 at 2000 units per day, indefinitely.

## 2019-09-22 NOTE — Assessment & Plan Note (Signed)
Left sided, d/w pt - not c/w CTS, but should see Dr Margaretha Sheffield [facility] to consider local cortisone tx

## 2019-09-22 NOTE — Progress Notes (Signed)
Subjective:    Patient ID: [name], female    DOB: October 22, 1960, 59 y.o.   MRN: [id number]  HPI  Here with facial rash similar to oct 2020 episode, worse with wearing her surgical mask for several days, rarely o/w wears at home.  Also have pain and sweling to the left dorsal thumb for several days.  Pt denies chest pain, increased sob or doe, wheezing, orthopnea, PND, increased LE swelling, palpitations, dizziness or syncope. Pt denies new neurological symptoms such as new headache, or facial or extremity weakness or numbness   Pt denies polydipsia, polyuria Past Medical History:  Diagnosis Date  . Anemia   . ANEMIA-IRON DEFICIENCY 01/03/2007  . Anxiety   . ANXIETY 01/03/2007  . Complication of anesthesia    severe post-operative nausea except for bladder surgery in 2002 at [place] not have complications then  . Depression   . DEPRESSION 01/03/2007  . DJD (degenerative joint disease)    knee  . Family history of adverse reaction to anesthesia    mother and father both had severe post-operative nausea and vomiting  . GERD 01/16/2009  . GERD (gastroesophageal reflux disease)   . Headache(784.0) 01/03/2007  . Hyperplastic colon polyp    04/2010  . HYPERSOMNIA 04/04/2010  . Hypertension   . IBS 01/16/2009  . IBS (irritable bowel syndrome)   . Impaired glucose tolerance 05/18/2011  . Menopause    early  . Migraine   . Obesity   . 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 PONV (postoperative nausea and vomiting)   . Wheezing 07/02/2010   Past Surgical History:  Procedure Laterality Date  . ABDOMINAL HYSTERECTOMY    . APPENDECTOMY    . bladder tac    . CHOLECYSTECTOMY    . KNEE ARTHROSCOPY Bilateral   . OOPHORECTOMY      reports that she has never smoked. She has never used smokeless tobacco. She reports that she does not drink alcohol or use drugs. family history includes Diabetes in her brother; Hypertension in her mother. Allergies  Allergen Reactions  .  Meperidine Shortness Of Breath  . Morphine Anaphylaxis  . Penicillins Hives, Nausea Only, Swelling and Other (See Comments)    PATIENT HAS HAD A PCN REACTION WITH IMMEDIATE RASH, FACIAL/TONGUE/THROAT SWELLING, SOB, OR LIGHTHEADEDNESS WITH HYPOTENSION:  #  #  YES  #  #  HAS PT DEVELOPED SEVERE RASH INVOLVING MUCUS MEMBRANES or SKIN NECROSIS: #  #  YES  #  #  PATIENT HAS HAD A PCN REACTION THAT REQUIRED HOSPITALIZATION:  #  #  YES  #  #  Has patient had a PCN reaction occurring within the last 10 years: No   . Lisinopril Cough       . Doxycycline     nausea  . Gabapentin Other (See Comments)    Muscle pain, fatigue  . Amoxicillin Nausea Only  . Azithromycin Nausea And Vomiting  . Codeine Nausea Only and Other (See Comments)    Hot flashes also  . Hydrocodone Itching, Nausea Only and Rash  . Omeprazole Itching and Rash  . Oxycodone-Acetaminophen Nausea And Vomiting and Rash  . Sucralfate Rash and Other (See Comments)    Blurry vision   . Tramadol Itching, Nausea Only and Rash   Current Outpatient Medications on File Prior to Visit  Medication Sig Dispense Refill  . acetaminophen (TYLENOL) 500 MG tablet Take 1,000 mg by mouth daily as needed for moderate pain or headache.    Marland Kitchen  carisoprodol (SOMA) 350 MG tablet Take 1 tablet (350 mg total) by mouth 4 (four) times daily as needed for muscle spasms. 40 tablet 0  . cetirizine (ZYRTEC) 10 MG tablet Take 1 tablet (10 mg total) by mouth daily. 30 tablet 11  . cyclobenzaprine (FLEXERIL) 10 MG tablet Take 1/2-1 tab twice daily as needed for muscle spasm. 20 tablet 0  . esomeprazole (NEXIUM) 20 MG capsule Take 20 mg by mouth daily.     Marland Kitchen estradiol (ESTRACE) 1 MG tablet Take 1 mg by mouth daily.     Creig Hines MAINTENANCE PACK 10 MCG INST INSERT 1 VAGINAL INSERT(S) TWICE A WEEK BY VAGINAL ROUTE.    Marland Kitchen losartan (COZAAR) 50 MG tablet Take 1 tablet (50 mg total) by mouth daily. New dose, d/c previous rx 90 tablet 3  . mupirocin ointment (BACTROBAN) 2  % Apply 1 application topically 2 (two) times daily. 30 g 0  . Probiotic CAPS Take 1 capsule by mouth daily.    . promethazine (PHENERGAN) 25 MG tablet Take 25 mg by mouth daily as needed for nausea or vomiting.    . triamcinolone (NASACORT) 55 MCG/ACT AERO nasal inhaler Place 2 sprays into the nose daily. 1 Inhaler 12   No current facility-administered medications on file prior to visit.   Review of Systems All otherwise neg per pt     Objective:   Physical Exam BP 130/76   Pulse 62   Temp 98.1 F (36.7 C)   Ht 5\' 5"  (1.651 m)   Wt 227 lb (103 kg)   SpO2 99%   BMI 37.77 kg/m  VS noted,  Constitutional: Pt appears in NAD HENT: Head: NCAT.  Right Ear: External ear normal.  Left Ear: External ear normal.  Eyes: . Pupils are equal, round, and reactive to light. Conjunctivae and EOM are normal Nose: without d/c or deformity Neck: Neck supple. Gross normal ROM Cardiovascular: Normal rate and regular rhythm.   Pulmonary/Chest: Effort normal and breath sounds without rales or wheezing.  Dorsal tender lfet thumb Neurological: Pt is alert. At baseline orientation, motor grossly intact Skin: Skin is warm. Diffuse bilat facial rash in mask distribution,, no other new lesions, no LE edema Psychiatric: Pt behavior is normal without agitation  All otherwise neg per pt Lab Results  Component Value Date   WBC 6.4 09/15/2019   HGB 13.5 09/15/2019   HCT 40.4 09/15/2019   PLT 209.0 09/15/2019   GLUCOSE 114 (H) 09/22/2019   CHOL 193 09/22/2019   TRIG 213 (H) 09/22/2019   HDL 54 09/22/2019   LDLDIRECT 97.0 09/15/2019   LDLCALC 103 (H) 09/22/2019   ALT 11 09/22/2019   AST 17 09/22/2019   NA 139 09/22/2019   K 4.1 09/22/2019   CL 102 09/22/2019   CREATININE 0.74 09/22/2019   BUN 9 09/22/2019   CO2 23 09/22/2019   TSH 1.45 09/15/2019   HGBA1C 6.2 09/15/2019   MICROALBUR <0.7 09/15/2019      Assessment & Plan:

## 2019-09-29 ENCOUNTER — Other Ambulatory Visit: Payer: Self-pay

## 2019-09-29 ENCOUNTER — Ambulatory Visit: Payer: 59 | Admitting: Cardiovascular Disease

## 2019-09-29 ENCOUNTER — Encounter: Payer: Self-pay | Admitting: Cardiovascular Disease

## 2019-09-29 VITALS — BP 132/74 | HR 66 | Ht 65.0 in | Wt 227.0 lb

## 2019-09-29 DIAGNOSIS — I10 Essential (primary) hypertension: Secondary | ICD-10-CM | POA: Diagnosis not present

## 2019-09-29 DIAGNOSIS — R42 Dizziness and giddiness: Secondary | ICD-10-CM

## 2019-09-29 DIAGNOSIS — R0600 Dyspnea, unspecified: Secondary | ICD-10-CM

## 2019-09-29 DIAGNOSIS — E049 Nontoxic goiter, unspecified: Secondary | ICD-10-CM

## 2019-09-29 DIAGNOSIS — R0609 Other forms of dyspnea: Secondary | ICD-10-CM

## 2019-09-29 NOTE — Progress Notes (Signed)
Cardiology Office Note   Date:  09/29/2019   ID:  [name], DOB 03-Apr-1961, MRN [id number]  PCP:  Corwin Levins, MD  Cardiologist:   Chilton Si, MD   No chief complaint on file.   History of Present Illness: [name] is a 59 y.o. female with hypertension, gastroesophageal reflux disease and depression who presents for follow up.  [name] was referred 04/2015 for a complaint of dizziness and dyspnea on exertion.  EKG showed sinus rhythm with low voltage and poor R wave progression.  BNP was 17.  The episodes occurred with exertion and were associated with a flushed feeling.  Her symptoms were felt to be neurocardiogenic and she was asked to wear compression stockings (also to help with her varicose veins) and increase her fluid intake.  Lab testing for pheochromocytoma and carcinoid were negative.  She underwent Lexiscan Cardiolite 04/2015 that was negative for ischemia and echocardiogram 05/2015 revealed grade 1 diastolic dysfunction.  Her blood pressure was intermittently elevated. She reported having a lot of anxiety and stress at the time. She was instructed to follow-up with her PCP or psychiatrist to discuss her anxiety.  Her blood pressure remained elevated so she was started on lisinopril.  This was switched to losartan due to cough.  At her last appointment she reported lower extremity numbness and claudication.  She was referred for ABIs 01/30/17 that were normal bilaterally.  Since her last appointment [name] has been doing well.  Her blood pressure at home has been well have been controlled around 110s over 70s.  She continues to have high stress levels.  She and her husband all get along.  He was also recently diagnosed with prostate cancer.  She is try to get more exercise and walks up to 11,000 steps daily.  She is doing meal replacement at night and trying to limit her candy intake.  She looks forward to try to get back into the pool.  She reports some  episodes of feeling dizzy when she lying back in the bed.  She feels okay when she is on her stomach.  She has no lower extremity edema, orthopnea, or PND she denies any chest pain or pressure.  She has had some hot flashes lately that she notes that seem to have her after taking her losartan.  She also feels cold at night.  Incidentally, she has been waiting her estradiol recently.  She is considering having surgery on her knees in September.  She has noticed some bulging in her neck that does not swallowing and is not painful.   Past Medical History:  Diagnosis Date  . Anemia   . ANEMIA-IRON DEFICIENCY 01/03/2007  . Anxiety   . ANXIETY 01/03/2007  . Complication of anesthesia    severe post-operative nausea except for bladder surgery in 2002 at [facility]-did not have complications then  . Depression   . DEPRESSION 01/03/2007  . DJD (degenerative joint disease)    knee  . Family history of adverse reaction to anesthesia    mother and father both had severe post-operative nausea and vomiting  . GERD 01/16/2009  . GERD (gastroesophageal reflux disease)   . Headache(784.0) 01/03/2007  . Hyperplastic colon polyp    04/2010  . HYPERSOMNIA 04/04/2010  . Hypertension   . IBS 01/16/2009  . IBS (irritable bowel syndrome)   . Impaired glucose tolerance 05/18/2011  . Menopause    early  . Migraine   . Obesity   .  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 PONV (postoperative nausea and vomiting)   . Wheezing 07/02/2010    Past Surgical History:  Procedure Laterality Date  . ABDOMINAL HYSTERECTOMY    . APPENDECTOMY    . bladder tac    . CHOLECYSTECTOMY    . KNEE ARTHROSCOPY Bilateral   . OOPHORECTOMY       Current Outpatient Medications  Medication Sig Dispense Refill  . acetaminophen (TYLENOL) 500 MG tablet Take 1,000 mg by mouth daily as needed for moderate pain or headache.    . carisoprodol (SOMA) 350 MG tablet Take 1 tablet (350 mg total) by mouth 4 (four) times daily as  needed for muscle spasms. 40 tablet 0  . cetirizine (ZYRTEC) 10 MG tablet Take 1 tablet (10 mg total) by mouth daily. 30 tablet 11  . cyclobenzaprine (FLEXERIL) 10 MG tablet Take 1/2-1 tab twice daily as needed for muscle spasm. 20 tablet 0  . esomeprazole (NEXIUM) 20 MG capsule Take 20 mg by mouth daily.     Marland Kitchen estradiol (ESTRACE) 1 MG tablet Take 1 mg by mouth daily.     Creig Hines MAINTENANCE PACK 10 MCG INST INSERT 1 VAGINAL INSERT(S) TWICE A WEEK BY VAGINAL ROUTE.    Marland Kitchen losartan (COZAAR) 50 MG tablet Take 1 tablet (50 mg total) by mouth daily. New dose, d/c previous rx 90 tablet 3  . mupirocin ointment (BACTROBAN) 2 % Apply 1 application topically 2 (two) times daily. 30 g 0  . predniSONE (DELTASONE) 10 MG tablet 3 tabs by mouth per day for 3 days,2tabs per day for 3 days,1tab per day for 3 days 18 tablet 0  . Probiotic CAPS Take 1 capsule by mouth daily.    . promethazine (PHENERGAN) 25 MG tablet Take 25 mg by mouth daily as needed for nausea or vomiting.    . triamcinolone (NASACORT) 55 MCG/ACT AERO nasal inhaler Place 2 sprays into the nose daily. 1 Inhaler 12  . triamcinolone cream (KENALOG) 0.1 % Apply 1 application topically daily as needed. 30 g 1  . Vitamin D, Ergocalciferol, (DRISDOL) 1.25 MG (50000 UNIT) CAPS capsule Take 1 capsule (50,000 Units total) by mouth every 7 (seven) days. 12 capsule 0   No current facility-administered medications for this visit.    Allergies:   Meperidine, Morphine, Penicillins, Lisinopril, Doxycycline, Gabapentin, Amoxicillin, Azithromycin, Codeine, Hydrocodone, Omeprazole, Oxycodone-acetaminophen, Sucralfate, and Tramadol    Social History:  The patient  reports that she has never smoked. She has never used smokeless tobacco. She reports that she does not drink alcohol or use drugs.   Family History:  The patient's family history includes Diabetes in her brother; Hypertension in her mother.    ROS:  Please see the history of present illness.    Otherwise, review of systems are positive for none.   All other systems are reviewed and negative.    PHYSICAL EXAM: VS:  BP 132/74   Pulse 66   Ht 5\' 5"  (1.651 m)   Wt 227 lb (103 kg)   SpO2 94%   BMI 37.77 kg/m  , BMI Body mass index is 37.77 kg/m. GENERAL:  Well appearing HEENT: Pupils equal round and reactive, fundi not visualized, oral mucosa unremarkable NECK:  No jugular venous distention, waveform within normal limits, carotid upstroke brisk and symmetric, no bruits, + thyromegaly LUNGS:  Clear to auscultation bilaterally HEART:  RRR.  PMI not displaced or sustained,S1 and S2 within normal limits, no S3, no S4, no clicks, no rubs, no  murmurs ABD:  Flat, positive bowel sounds normal in frequency in pitch, no bruits, no rebound, no guarding, no midline pulsatile mass, no hepatomegaly, no splenomegaly EXT:  2 plus radial pulses.  1+ DP/PT bilaterally.  No edema, no cyanosis no clubbing SKIN:  No rashes no nodules NEURO:  Cranial nerves II through XII grossly intact, motor grossly intact throughout PSYCH:  Cognitively intact, oriented to person place and time   EKG:  EKG is ordered today. 01/12/16: Sinus bradycardia.  Rate 56 bpm.  08/02/16: Sinus rhythm.  Rate 78 bpm.   05/08/17: Sinus rhythm.  Rate 76 bpm.  Poor R wave progression.  Low voltage limb leads. 02/24/2018: Sinus rhythm.  Rate 67 bpm. 02/25/19: Sinus bradycardia.  Rate 53 bpm.  Nonspecific T wave abnormalities. 09/29/19: Sinus rhythm.  Rate 66 bpm.  Low voltage.  Poor  R wave progression.    Echo 06/07/15: Study Conclusions  - Left ventricle: The cavity size was normal. There was mild focal basal hypertrophy of the septum. Systolic function was vigorous. The estimated ejection fraction was in the range of 65% to 70%. Wall motion was normal; there were no regional wall motion abnormalities. Doppler parameters are consistent with abnormal left ventricular relaxation (grade 1 diastolic dysfunction). There was  no evidence of elevated ventricular filling pressure by Doppler parameters. - Aortic valve: Trileaflet; normal thickness leaflets. There was no regurgitation. - Aortic root: The aortic root was normal in size. - Ascending aorta: The ascending aorta was normal in size. - Mitral valve: There was trivial regurgitation. - Left atrium: The atrium was normal in size. - Right ventricle: Systolic function was normal. - Tricuspid valve: There was trivial regurgitation. - Pulmonic valve: There was no regurgitation. - Pulmonary arteries: Systolic pressure was within the normal range. - Inferior vena cava: The vessel was normal in size. - Pericardium, extracardiac: There was no pericardial effusion.  Lexiscan Cardiolite 05/24/15:  Nuclear stress EF: 73%.  The left ventricular ejection fraction is hyperdynamic (>65%).  There was no ST segment deviation noted during stress.  The study is normal.  ABIs 01/30/17: normal  Recent Labs: 09/15/2019: Hemoglobin 13.5; Platelets 209.0; TSH 1.45 09/22/2019: ALT 11; BUN 9; Creatinine, Ser 0.74; Potassium 4.1; Sodium 139    Lipid Panel    Component Value Date/Time   CHOL 193 09/22/2019 1017   TRIG 213 (H) 09/22/2019 1017   HDL 54 09/22/2019 1017   CHOLHDL 3.6 09/22/2019 1017   CHOLHDL 4 09/15/2019 0829   VLDL 63.6 (H) 09/15/2019 0829   LDLCALC 103 (H) 09/22/2019 1017   LDLDIRECT 97.0 09/15/2019 0829      Wt Readings from Last 3 Encounters:  09/29/19 227 lb (103 kg)  09/22/19 227 lb (103 kg)  05/25/19 236 lb (107 kg)      ASSESSMENT AND PLAN:  # Hypertension:  Blood pressure is mildly elevated today.  At home it has been well have been controlled.  New losartan.  I do not think her hot flashes are related to the medicine and rather related to her weaning her estradiol.  # Chest pain: Resolved.  Her chest pain was associated with anxiety.  Stress testing 04/2015 was negative for ischemia.    # Enlarged thyroid: Noted on exam and  patient reports that her neck seems to be swollen.  We will refer her for thyroid ultrasound.  Check TSH and free T4.   # Claudication: # Numbness: ABIs normal 01/2017.  This is sciatica and back related.    #  Pre-surgical risk assessment:  Ms. Schremp has no unstable cardiac conditions.  She is at low risk for both back and knee surgeries .   # CV Disease Prevention: She will return for fasting lipids and CMP.   Current medicines are reviewed at length with the patient today.  The patient does not have concerns regarding medicines.  The following changes have been made:  Labs/ tests ordered today include:   No orders of the defined types were placed in this encounter.     Disposition:   FU with Cristina Ceniceros C. Duke Salvia, MD in 6 months.   Signed, Chilton Si, MD  09/29/2019 10:37 AM    [facility]

## 2019-09-29 NOTE — Patient Instructions (Addendum)
Medication Instructions:  Your physician recommends that you continue on your current medications as directed. Please refer to the Current Medication list given to you today.  *If you need a refill on your cardiac medications before your next appointment, please call your pharmacy*  Lab Work: NONE   Testing/Procedures: THYROID ULTRASOUND -NO PREP [phone] IF YOU NEED TO RESCHEDULE Monday 10/04/2019 ARRIVE AT 1:40  [address] [place] [facility]   Follow-Up: At [facility], you and your health needs are our priority.  As part of our continuing mission to provide you with exceptional heart care, we have created designated Provider Care Teams.  These Care Teams include your primary Cardiologist (physician) and Advanced Practice Providers (APPs -  Physician Assistants and Nurse Practitioners) who all work together to provide you with the care you need, when you need it.  We recommend signing up for the patient portal called "MyChart".  Sign up information is provided on this After Visit Summary.  MyChart is used to connect with patients for Virtual Visits (Telemedicine).  Patients are able to view lab/test results, encounter notes, upcoming appointments, etc.  Non-urgent messages can be sent to your provider as well.   To learn more about what you can do with MyChart, go to [email or web address].    Your next appointment:   6 month(s)  The format for your next appointment:   In Person  Provider:   You may see Chilton Si, MD or one of the following Advanced Practice Providers on your designated Care Team:    Corine Shelter, PA-C  [place], [place]  Edd Fabian, [place]

## 2019-09-30 ENCOUNTER — Ambulatory Visit: Payer: 59 | Admitting: Sports Medicine

## 2019-09-30 VITALS — BP 126/84 | Ht 65.0 in | Wt 227.0 lb

## 2019-09-30 DIAGNOSIS — M1812 Unilateral primary osteoarthritis of first carpometacarpal joint, left hand: Secondary | ICD-10-CM | POA: Diagnosis not present

## 2019-09-30 MED ORDER — METHYLPREDNISOLONE ACETATE 40 MG/ML IJ SUSP
20.0000 mg | Freq: Once | INTRAMUSCULAR | Status: AC
  Administered 2019-09-30: 20 mg via INTRA_ARTICULAR

## 2019-09-30 NOTE — Progress Notes (Signed)
  [name] - 59 y.o. female MRN [id number]  Date of birth: 12/20/1960  SUBJECTIVE:   CC: left thumb pain   Left handed female presenting with left thumb pain for the past month. She reports that she has pain with opening jars, vacuuming, cutting, working in the yard. Very sensitive to touch. She has tried ibuprofen and tylenol with minimal relief. No numbness/tingling or weakness.  ROS: No swelling, instability, muscle pain, numbness/tingling, redness, otherwise see HPI   PMHx - Updated and reviewed.  Contributory factors include: Negative PSHx - Updated and reviewed.  Contributory factors include:  Negative FHx - Updated and reviewed.  Contributory factors include:  Negative Social Hx - Updated and reviewed. Contributory factors include: Negative Medications - reviewed   DATA REVIEWED: none  PHYSICAL EXAM:  VS: BP:126/84  HR: bpm  TEMP: ( )  RESP:   HT:5\' 5"  (165.1 cm)   WT:227 lb (103 kg)  BMI:37.77 PHYSICAL EXAM: Gen: NAD, alert, cooperative with exam, well-appearing HEENT: clear conjunctiva,  CV:  no edema, capillary refill brisk, normal rate Resp: non-labored Skin: no rashes, normal turgor  Neuro: no gross deficits.  Psych:  alert and oriented  Left Hand: Inspection: No obvious deformity. No swelling, erythema or bruising Palpation: TTP over CMC joint ROM: Full ROM of the digits and wrist. Full ROM at [facility] joint, painful Strength: 5/5 strength in the forearm, wrist and interosseus muscles Neurovascular: NV intact Special tests: Negative finkelstein's, negative tinel's at the carpal tunnel, negative Phalen's and reverse Phalen's   ASSESSMENT & PLAN:  59 yo female presenting with right CMC pain for the past month. Assuming it is from arthritis. Discussed various options and patient elected for corticosteroid injection of joint. Spurring noted on ultrasound during procedure. Return as needed.  Procedure performed: Right CMC joint corticosteroid injection; US  guided  Consent obtained and verified. Time-out conducted. Noted no overlying erythema, induration, or other signs of local infection. The [facility] was visualized with ultrasound. The over overlying skin was prepped prepped in a sterile fashion. Topical analgesic spray: Ethyl chloride. Joint: Right first MTP Needle: 25-gauge 5/8 inch Completed without difficulty. Meds: DepoMedrol 20 mg, lidocaine 0.5 cc  Advised to call if fevers/chills, erythema, induration, drainage, or persistent bleeding.  Patient seen and evaluated with the sports medicine fellow.  I agree with the above plan of care.  Immediately after cortisone injection, patient suffered a vasovagal reaction.  She was placed into a supine position and given water.  After several minutes symptoms resolved.  She was discharged in good condition.

## 2019-10-04 ENCOUNTER — Ambulatory Visit
Admission: RE | Admit: 2019-10-04 | Discharge: 2019-10-04 | Disposition: A | Discharge status: Home / Self Care | Payer: 59 | Source: Ambulatory Visit | Attending: Cardiovascular Disease | Admitting: Cardiovascular Disease

## 2019-10-04 DIAGNOSIS — E049 Nontoxic goiter, unspecified: Secondary | ICD-10-CM

## 2019-10-05 ENCOUNTER — Ambulatory Visit: Payer: 59 | Attending: Family

## 2019-10-05 DIAGNOSIS — Z23 Encounter for immunization: Secondary | ICD-10-CM

## 2019-10-05 NOTE — Progress Notes (Signed)
   Covid-19 Vaccination Clinic  Name:  [name]    MRN: [id number] DOB: 18-Jul-1960  10/05/2019  [name] was observed post Covid-19 immunization for 30 minutes based on pre-vaccination screening without incident. She was provided with Vaccine Information Sheet and instruction to access the V-Safe system.   [name] was instructed to call 911 with any severe reactions post vaccine: Marland Kitchen Difficulty breathing  . Swelling of face and throat  . A fast heartbeat  . A bad rash all over body  . Dizziness and weakness   Immunizations Administered    Name Date Dose VIS Date Route   Moderna COVID-19 Vaccine 10/05/2019  1:51 PM 0.5 mL 05/25/2019 Intramuscular   Manufacturer: Moderna   Lot: [id number]   NDC: 48303-220-19

## 2019-10-07 ENCOUNTER — Telehealth: Payer: Self-pay | Admitting: Internal Medicine

## 2019-10-07 ENCOUNTER — Other Ambulatory Visit: Payer: Self-pay | Admitting: Student

## 2019-10-07 DIAGNOSIS — M5416 Radiculopathy, lumbar region: Secondary | ICD-10-CM

## 2019-10-07 DIAGNOSIS — E041 Nontoxic single thyroid nodule: Secondary | ICD-10-CM

## 2019-10-07 NOTE — Telephone Encounter (Signed)
Ok this is ordered  Please staff to let pt know

## 2019-10-07 NOTE — Telephone Encounter (Signed)
-----   Message from Chilton Si, MD sent at 10/06/2019 11:22 PM EDT ----- Thyroid shows nodules.  Dr. Jonny Ruiz, they recommended FNA.  Where should I refer her to have this done?  Would you be able to follow her ultrasound next year

## 2019-10-08 ENCOUNTER — Telehealth: Payer: Self-pay | Admitting: Internal Medicine

## 2019-10-08 NOTE — Telephone Encounter (Signed)
New Message:   Pt is calling and would like for the doctor to send in some type of muscle relaxer. She states her back is so tight. Please advise.   [id number] - [place], [place] - [address]

## 2019-10-08 NOTE — Telephone Encounter (Signed)
Called pt no answer LMOM w/MD response../lmb 

## 2019-10-08 NOTE — Telephone Encounter (Signed)
New Message:   Pt is calling back to see if anyone in the office can prescribe some medication for her back spasms. I have advised the pt that the Dr is out of town. Please advise.

## 2019-10-11 ENCOUNTER — Other Ambulatory Visit: Payer: Self-pay

## 2019-10-11 ENCOUNTER — Encounter: Payer: Self-pay | Admitting: Family

## 2019-10-11 ENCOUNTER — Ambulatory Visit: Payer: 59 | Admitting: Family

## 2019-10-11 VITALS — BP 122/80 | HR 87 | Temp 98.6°F | Wt 229.2 lb

## 2019-10-11 DIAGNOSIS — M62830 Muscle spasm of back: Secondary | ICD-10-CM | POA: Diagnosis not present

## 2019-10-11 MED ORDER — METHYLPREDNISOLONE ACETATE 40 MG/ML IJ SUSP
40.0000 mg | Freq: Once | INTRAMUSCULAR | Status: AC
  Administered 2019-10-11: 40 mg via INTRAMUSCULAR

## 2019-10-11 NOTE — Progress Notes (Signed)
[name] is a 59 y.o. female with the following history as recorded in EpicCare:  Patient Active Problem List   Diagnosis Date Noted  . Tenosynovitis of thumb 09/22/2019  . Angioedema 04/21/2019  . Blurred vision 05/20/2018  . Allergic rhinitis 05/20/2018  . Degenerative spondylolisthesis 03/03/2018  . Preop exam for internal medicine 02/18/2018  . Shingles 02/09/2018  . 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 Tinea cruris 01/05/2018  . Contact dermatitis 12/24/2017  . Surgical menopause 03/05/2017  . Vitamin D deficiency 03/05/2017  . Dizziness 06/22/2016  . Nausea without vomiting 06/22/2016  . Hyperglycemia 02/29/2016  . Hypertriglyceridemia 02/29/2016  . Dyspnea on exertion 04/15/2015  . Essential hypertension 04/10/2015  . Delayed gastric emptying 12/16/2012  . Chronic pain 05/08/2012  . Muscle spasm of back 05/08/2012  . Chest pain 05/08/2012  . Gastroparesis 05/22/2011  . Preventative health care 05/18/2011  . HYPERSOMNIA 04/04/2010  . GERD 01/16/2009  . IBS 01/16/2009  . 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 ANEMIA-IRON DEFICIENCY 01/03/2007  . ANXIETY 01/03/2007  . DEPRESSION 01/03/2007    Current Outpatient Medications  Medication Sig Dispense Refill  . acetaminophen (TYLENOL) 500 MG tablet Take 1,000 mg by mouth daily as needed for moderate pain or headache.    . carisoprodol (SOMA) 350 MG tablet Take 1 tablet (350 mg total) by mouth 4 (four) times daily as needed for muscle spasms. 40 tablet 0  . cetirizine (ZYRTEC) 10 MG tablet Take 1 tablet (10 mg total) by mouth daily. 30 tablet 11  . cyclobenzaprine (FLEXERIL) 10 MG tablet Take 1/2-1 tab twice daily as needed for muscle spasm. 20 tablet 0  . esomeprazole (NEXIUM) 20 MG capsule Take 20 mg by mouth daily.     Marland Kitchen estradiol (ESTRACE) 1 MG tablet Take 1 mg by mouth daily.     Harlow Mares MAINTENANCE PACK 10 MCG INST INSERT 1 VAGINAL INSERT(S) TWICE A WEEK BY VAGINAL ROUTE.    Marland Kitchen losartan  (COZAAR) 50 MG tablet Take 1 tablet (50 mg total) by mouth daily. New dose, d/c previous rx 90 tablet 3  . mupirocin ointment (BACTROBAN) 2 % Apply 1 application topically 2 (two) times daily. 30 g 0  . Probiotic CAPS Take 1 capsule by mouth daily.    . promethazine (PHENERGAN) 25 MG tablet Take 25 mg by mouth daily as needed for nausea or vomiting.    . triamcinolone (NASACORT) 55 MCG/ACT AERO nasal inhaler Place 2 sprays into the nose daily. 1 Inhaler 12  . triamcinolone cream (KENALOG) 0.1 % Apply 1 application topically daily as needed. 30 g 1   No current facility-administered medications for this visit.    Allergies: Meperidine, Morphine, Penicillins, Lisinopril, Doxycycline, Gabapentin, Amoxicillin, Azithromycin, Codeine, Hydrocodone, Omeprazole, Oxycodone-acetaminophen, Sucralfate, and Tramadol  Past Medical History:  Diagnosis Date  . Anemia   . ANEMIA-IRON DEFICIENCY 01/03/2007  . Anxiety   . ANXIETY 01/03/2007  . Complication of anesthesia    severe post-operative nausea except for bladder surgery in 2002 at [place] not have complications then  . Depression   . DEPRESSION 01/03/2007  . DJD (degenerative joint disease)    knee  . Family history of adverse reaction to anesthesia    mother and father both had severe post-operative nausea and vomiting  . GERD 01/16/2009  . GERD (gastroesophageal reflux disease)   . Headache(784.0) 01/03/2007  . Hyperplastic colon polyp    04/2010  . HYPERSOMNIA 04/04/2010  . Hypertension   . IBS 01/16/2009  .  IBS (irritable bowel syndrome)   . Impaired glucose tolerance 05/18/2011  . Menopause    early  . Migraine   . Obesity   . 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 PONV (postoperative nausea and vomiting)   . Wheezing 07/02/2010    Past Surgical History:  Procedure Laterality Date  . ABDOMINAL HYSTERECTOMY    . APPENDECTOMY    . bladder tac    . CHOLECYSTECTOMY    . KNEE ARTHROSCOPY Bilateral   . OOPHORECTOMY       Family History  Problem Relation Age of Onset  . Hypertension Mother   . Diabetes Brother     Social History   Tobacco Use  . Smoking status: Never Smoker  . Smokeless tobacco: Never Used  Substance Use Topics  . Alcohol use: No    Alcohol/week: 0.0 standard drinks    Subjective:  Neck/ upper back pain x 3 days; seemed to start after working in the yard on Thursday; notes that upper back "feels very tight." No numbness/ no tingling; has used Flexeril and Soma with limited benefit; has also been taking Tylenol and left over hydromorphone with limited benefit;   Objective:  Vitals:   10/11/19 1353  BP: 122/80  Pulse: 87  Temp: 98.6 F (37 C)  TempSrc: Oral  SpO2: 96%  Weight: 229 lb 3.2 oz (104 kg)    General: Well developed, well nourished, in no acute distress  Skin : Warm and dry.  Head: Normocephalic and atraumatic  Lungs: Respirations unlabored; clear to auscultation bilaterally without wheeze, rales, rhonchi  CVS exam: normal rate and regular rhythm.  Musculoskeletal: No deformities; no active joint inflammation  Extremities: No edema, cyanosis, clubbing  Vessels: Symmetric bilaterally  Neurologic: Alert and oriented; speech intact; face symmetrical; moves all extremities well; CNII-XII intact without focal deficit   Assessment:  1. Muscle spasm of back     Plan:  Continue Flexeril 10 mg tid as tolerated; Depo-Medrol IM 40 mg given in office today; follow-up worse, no better.  This visit occurred during the SARS-CoV-2 public health emergency.  Safety protocols were in place, including screening questions prior to the visit, additional usage of staff PPE, and extensive cleaning of exam room while observing appropriate contact time as indicated for disinfecting solutions.     No follow-ups on file.  No orders of the defined types were placed in this encounter.   Requested Prescriptions    No prescriptions requested or ordered in this encounter

## 2019-10-11 NOTE — Addendum Note (Signed)
Addended by: Deatra James on: 10/11/2019 02:33 PM   Modules accepted: Orders

## 2019-10-12 MED ORDER — CYCLOBENZAPRINE HCL 10 MG PO TABS: ORAL_TABLET | ORAL | 1 refills | Status: DC

## 2019-10-12 NOTE — Telephone Encounter (Signed)
Ok for flexeril refill - done erx

## 2019-10-12 NOTE — Telephone Encounter (Signed)
See messages

## 2019-10-14 ENCOUNTER — Other Ambulatory Visit (HOSPITAL_COMMUNITY)
Admission: RE | Admit: 2019-10-14 | Discharge: 2019-10-14 | Disposition: A | Discharge status: Home / Self Care | Payer: 59 | Source: Ambulatory Visit | Attending: Physician Assistant | Admitting: Physician Assistant

## 2019-10-14 ENCOUNTER — Ambulatory Visit
Admission: RE | Admit: 2019-10-14 | Discharge: 2019-10-14 | Disposition: A | Discharge status: Home / Self Care | Payer: 59 | Source: Ambulatory Visit | Attending: Internal Medicine | Admitting: Internal Medicine

## 2019-10-14 ENCOUNTER — Other Ambulatory Visit: Payer: Self-pay

## 2019-10-14 ENCOUNTER — Ambulatory Visit: Payer: 59 | Admitting: Sports Medicine

## 2019-10-14 VITALS — BP 138/84 | Ht 65.0 in | Wt 228.0 lb

## 2019-10-14 DIAGNOSIS — M542 Cervicalgia: Secondary | ICD-10-CM

## 2019-10-14 DIAGNOSIS — E041 Nontoxic single thyroid nodule: Secondary | ICD-10-CM | POA: Insufficient documentation

## 2019-10-14 MED ORDER — PREDNISONE 10 MG PO TABS: ORAL_TABLET | ORAL | 0 refills | Status: DC

## 2019-10-14 NOTE — Progress Notes (Signed)
   Subjective:    Patient ID: [name], female    DOB: Dec 12, 1960, 59 y.o.   MRN: [id number]  HPI chief complaint: Neck and upper back pain  Desteni comes in today complaining of neck pain that radiates into each scapula and has been present for almost a week.  Symptoms began acutely upon him awakening last Friday.  She took a couple of different muscle relaxers as well as a pain pill over the weekend but when her symptoms persisted she saw her primary care physician this past Monday.  An IM cortisone injection was administered which did not help. She has noticed decreased cervical range of motion with her pain.  She denies radiating pain into her arms.  No numbness or tingling.  She has a known history of lumbar degenerative disc disease for which she has undergone surgery in the past with Dr. Dutch Quint.  No recent or remote trauma to the neck.    Review of Systems    As above Objective:   Physical Exam  Well-developed, well-nourished.  No acute distress.  Awake alert and oriented x3.  Vital signs reviewed  Cervical spine: Limited cervical rotation by about 50%.  Limited flexion and extension by about 50%.  No tenderness to palpation along the paraspinal musculature.  Mild spasm.  She is tender to palpation around the rhomboids along both scapulas.  No focal neurological deficit of either upper extremity appreciated grossly.      Assessment & Plan:  Neck and upper back pain likely secondary to facet arthropathy  Patient has done well in the past with oral steroids.  Therefore, we will put her on a 6-day Sterapred Dosepak to take as directed.  She is asking about the possibility of a massage and I think that that would benefit her.  I also discussed formal physical therapy but we will hold on that for now as she has a lot of medical issues requiring multiple doctors visits.  If symptoms persist consider cervical spine x-rays.  Follow-up for ongoing or recalcitrant issues.  Of note, her  left thumb pain is still present status post [facility] cortisone injection.  It is tolerable for now but if it worsens that I would get x-rays before deciding on further treatment.

## 2019-10-14 NOTE — Procedures (Signed)
PROCEDURE SUMMARY:  Using direct ultrasound guidance, 5 passes were made using 25 g needles into the nodule within the left lobe of the thyroid.   Ultrasound was used to confirm needle placements on all occasions.   EBL = trace  Specimens were sent to Pathology for analysis.  See procedure note under Imaging tab in Epic for full procedure details.  Joselynn Amoroso S Dottie Vaquerano PA-C 10/14/2019 11:28 AM

## 2019-10-15 LAB — CYTOLOGY - NON PAP

## 2019-10-18 ENCOUNTER — Encounter: Payer: Self-pay | Admitting: Cardiovascular Disease

## 2019-10-18 DIAGNOSIS — E049 Nontoxic goiter, unspecified: Secondary | ICD-10-CM | POA: Insufficient documentation

## 2019-10-18 HISTORY — DX: Nontoxic goiter, unspecified: E04.9

## 2019-11-05 ENCOUNTER — Other Ambulatory Visit: Payer: 59

## 2019-11-05 ENCOUNTER — Ambulatory Visit: Payer: 59 | Admitting: Internal Medicine

## 2019-11-05 ENCOUNTER — Encounter: Payer: Self-pay | Admitting: Internal Medicine

## 2019-11-05 ENCOUNTER — Other Ambulatory Visit: Payer: Self-pay

## 2019-11-05 VITALS — BP 164/72 | HR 79 | Temp 98.7°F | Ht 65.0 in | Wt 228.0 lb

## 2019-11-05 DIAGNOSIS — R21 Rash and other nonspecific skin eruption: Secondary | ICD-10-CM

## 2019-11-05 DIAGNOSIS — J309 Allergic rhinitis, unspecified: Secondary | ICD-10-CM

## 2019-11-05 DIAGNOSIS — I10 Essential (primary) hypertension: Secondary | ICD-10-CM

## 2019-11-05 LAB — CBC WITH DIFFERENTIAL/PLATELET
Absolute Monocytes: 498 cells/uL (ref 200–950)
Basophils Absolute: 48 cells/uL (ref 0–200)
Basophils Relative: 0.8 %
Eosinophils Absolute: 192 cells/uL (ref 15–500)
Eosinophils Relative: 3.2 %
HCT: 40.2 % (ref 35.0–45.0)
Hemoglobin: 13.3 g/dL (ref 11.7–15.5)
Lymphs Abs: 1704 cells/uL (ref 850–3900)
MCH: 29 pg (ref 27.0–33.0)
MCHC: 33.1 g/dL (ref 32.0–36.0)
MCV: 87.8 fL (ref 80.0–100.0)
MPV: 12.2 fL (ref 7.5–12.5)
Monocytes Relative: 8.3 %
Neutro Abs: 3558 cells/uL (ref 1500–7800)
Neutrophils Relative %: 59.3 %
Platelets: 180 10*3/uL (ref 140–400)
RBC: 4.58 10*6/uL (ref 3.80–5.10)
RDW: 12.6 % (ref 11.0–15.0)
Total Lymphocyte: 28.4 %
WBC: 6 10*3/uL (ref 3.8–10.8)

## 2019-11-05 LAB — SEDIMENTATION RATE: Sed Rate: 14 mm/h (ref 0–30)

## 2019-11-05 MED ORDER — PREDNISONE 10 MG PO TABS: ORAL_TABLET | ORAL | 0 refills | Status: DC

## 2019-11-05 MED ORDER — PROMETHAZINE HCL 25 MG/ML IJ SOLN
12.5000 mg | Freq: Once | INTRAMUSCULAR | Status: AC
  Administered 2019-11-05: 12.5 mg via INTRAMUSCULAR

## 2019-11-05 MED ORDER — AZITHROMYCIN 250 MG PO TABS: ORAL_TABLET | ORAL | 0 refills | Status: DC

## 2019-11-05 MED ORDER — KETOROLAC TROMETHAMINE 30 MG/ML IJ SOLN
30.0000 mg | Freq: Once | INTRAMUSCULAR | Status: AC
  Administered 2019-11-05: 30 mg via INTRAMUSCULAR

## 2019-11-05 MED ORDER — IBUPROFEN 800 MG PO TABS: 800.0000 mg | ORAL_TABLET | Freq: Three times a day (TID) | ORAL | 1 refills | Status: DC | PRN

## 2019-11-05 MED ORDER — METHYLPREDNISOLONE ACETATE 80 MG/ML IJ SUSP
80.0000 mg | Freq: Once | INTRAMUSCULAR | Status: AC
  Administered 2019-11-05: 80 mg via INTRAMUSCULAR

## 2019-11-05 NOTE — Progress Notes (Signed)
Subjective:    Patient ID: [name], female    DOB: [birth year], 59 y.o.   MRN: [id number]  HPI  Here to f/u with c/o 4 days onset painful tender rash to right forehead slightly bumpy but no clear vesicles, without fever but with signficant pain worst at the right temple area with whole area slightly raised swollen.  Did seem dermatology with best guess of shingles and given tx valtrex, and asked to come here with ? Of temporal arteritis.  Does have several wks ongoing nasal allergy symptoms with clearish congestion, itch and sneezing, without fever, pain, ST, cough, swelling or wheezing.  Pt denies chest pain, increased sob or doe, wheezing, orthopnea, PND, increased LE swelling, palpitations, dizziness or syncope.  Pt denies new neurological symptoms such as new headache, or facial or extremity weakness or numbness   Pt denies polydipsia, polyuria, Past Medical History:  Diagnosis Date  . Anemia   . ANEMIA-IRON DEFICIENCY 01/03/2007  . Anxiety   . ANXIETY 01/03/2007  . Complication of anesthesia    severe post-operative nausea except for bladder surgery in 2002 at [facility]-did not have complications then  . Depression   . DEPRESSION 01/03/2007  . DJD (degenerative joint disease)    knee  . Enlarged thyroid 10/18/2019  . Family history of adverse reaction to anesthesia    mother and father both had severe post-operative nausea and vomiting  . GERD 01/16/2009  . GERD (gastroesophageal reflux disease)   . Headache(784.0) 01/03/2007  . Hyperplastic colon polyp    04/2010  . HYPERSOMNIA 04/04/2010  . Hypertension   . IBS 01/16/2009  . IBS (irritable bowel syndrome)   . Impaired glucose tolerance 05/18/2011  . Menopause    early  . Migraine   . Obesity   . 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 PONV (postoperative nausea and vomiting)   . Wheezing 07/02/2010   Past Surgical History:  Procedure Laterality Date  . ABDOMINAL HYSTERECTOMY    . APPENDECTOMY    . bladder tac     . CHOLECYSTECTOMY    . KNEE ARTHROSCOPY Bilateral   . OOPHORECTOMY      reports that she has never smoked. She has never used smokeless tobacco. She reports that she does not drink alcohol or use drugs. family history includes Diabetes in her brother; Hypertension in her mother. Allergies  Allergen Reactions  . Meperidine Shortness Of Breath  . Morphine Anaphylaxis  . Penicillins Hives, Nausea Only, Swelling and Other (See Comments)    PATIENT HAS HAD A PCN REACTION WITH IMMEDIATE RASH, FACIAL/TONGUE/THROAT SWELLING, SOB, OR LIGHTHEADEDNESS WITH HYPOTENSION:  #  #  YES  #  #  HAS PT DEVELOPED SEVERE RASH INVOLVING MUCUS MEMBRANES or SKIN NECROSIS: #  #  YES  #  #  PATIENT HAS HAD A PCN REACTION THAT REQUIRED HOSPITALIZATION:  #  #  YES  #  #  Has patient had a PCN reaction occurring within the last 10 years: No   . Lisinopril Cough       . Doxycycline     nausea  . Gabapentin Other (See Comments)    Muscle pain, fatigue  . Amoxicillin Nausea Only  . Codeine Nausea Only and Other (See Comments)    Hot flashes also  . Hydrocodone Itching, Nausea Only and Rash  . Omeprazole Itching and Rash  . Oxycodone-Acetaminophen Nausea And Vomiting and Rash  . Sucralfate Rash and Other (See Comments)    Blurry vision   .  Tramadol Itching, Nausea Only and Rash   Current Outpatient Medications on File Prior to Visit  Medication Sig Dispense Refill  . acetaminophen (TYLENOL) 500 MG tablet Take 1,000 mg by mouth daily as needed for moderate pain or headache.    . celecoxib (CELEBREX) 100 MG capsule Take 100 mg by mouth every 12 (twelve) hours.    . cetirizine (ZYRTEC) 10 MG tablet Take 1 tablet (10 mg total) by mouth daily. 30 tablet 11  . cyclobenzaprine (FLEXERIL) 10 MG tablet Take 1/2-1 tab twice daily as needed for muscle spasm. 40 tablet 1  . esomeprazole (NEXIUM) 20 MG capsule Take 20 mg by mouth daily.     Marland Kitchen estradiol (ESTRACE) 1 MG tablet Take 1 mg by mouth daily.     Harlow Mares  MAINTENANCE PACK 10 MCG INST INSERT 1 VAGINAL INSERT(S) TWICE A WEEK BY VAGINAL ROUTE.    Marland Kitchen losartan (COZAAR) 50 MG tablet Take 1 tablet (50 mg total) by mouth daily. New dose, d/c previous rx 90 tablet 3  . Probiotic CAPS Take 1 capsule by mouth daily.    . promethazine (PHENERGAN) 25 MG tablet Take 25 mg by mouth daily as needed for nausea or vomiting.    . valACYclovir (VALTREX) 1000 MG tablet Take 1,000 mg by mouth 3 (three) times daily.    . Vitamin D, Ergocalciferol, (DRISDOL) 1.25 MG (50000 UNIT) CAPS capsule Take 50,000 Units by mouth once a week.     No current facility-administered medications on file prior to visit.   Review of Systems All otherwise neg per pt     Objective:   Physical Exam BP (!) 164/72 (BP Location: Left Arm, Patient Position: Sitting, Cuff Size: Large)   Pulse 79   Temp 98.7 F (37.1 C) (Oral)   Ht 5\' 5"  (1.651 m)   Wt 228 lb (103.4 kg)   SpO2 97%   BMI 37.94 kg/m  VS noted,  Constitutional: Pt appears in NAD HENT: Head: NCAT.  Right Ear: External ear normal.  Left Ear: External ear normal.  Eyes: . Pupils are equal, round, and reactive to light. Conjunctivae and EOM are normal Nose: without d/c or deformity Neck: Neck supple. Gross normal ROM Cardiovascular: Normal rate and regular rhythm.   Pulmonary/Chest: Effort normal and breath sounds without rales or wheezing.  Abd:  Soft, NT, ND, + BS, no organomegaly Neurological: Pt is alert. At baseline orientation, motor grossly intact Skin:  no LE edema, right forehead and temple tender slightly raised confluent erythema and ? few slightly raised bumps Psychiatric: Pt behavior is normal without agitation  All otherwise neg per pt Lab Results  Component Value Date   WBC 6.0 11/05/2019   HGB 13.3 11/05/2019   HCT 40.2 11/05/2019   PLT 180 11/05/2019   GLUCOSE 114 (H) 09/22/2019   CHOL 193 09/22/2019   TRIG 213 (H) 09/22/2019   HDL 54 09/22/2019   LDLDIRECT 97.0 09/15/2019   LDLCALC 103 (H)  09/22/2019   ALT 11 09/22/2019   AST 17 09/22/2019   NA 139 09/22/2019   K 4.1 09/22/2019   CL 102 09/22/2019   CREATININE 0.74 09/22/2019   BUN 9 09/22/2019   CO2 23 09/22/2019   TSH 1.45 09/15/2019   HGBA1C 6.2 09/15/2019   MICROALBUR <0.7 09/15/2019      Assessment & Plan:

## 2019-11-05 NOTE — Assessment & Plan Note (Signed)
Mild to mod, for tx as above,  to f/u any worsening symptoms or concerns 

## 2019-11-05 NOTE — Assessment & Plan Note (Addendum)
Etiology unclear, ? Cellulitis vs atypical shingles vs other/angioedema - for toradol 30 mg im , phenergan 12.5 mg, ibuprofen 800 prn, depomedrol im 80, zpack x 1, as well cbc/esr r/o arteritis  I spent 31 minutes in preparing to see the patient by review of recent labs, imaging and procedures, obtaining and reviewing separately obtained history, communicating with the patient and family or caregiver, ordering medications, tests or procedures, and documenting clinical information in the EHR including the differential Dx, treatment, and any further evaluation and other management of rash, allergies, htn

## 2019-11-05 NOTE — Assessment & Plan Note (Signed)
Elevated likely reactive, cont to monitor bp at home and next visit

## 2019-11-05 NOTE — Patient Instructions (Signed)
You had the pain and nausea medication shots (toradol and phenergan)  You had the steroid shot today  Please take all new medication as prescribed - the zpack, and prednisone  Please continue all other medications as before, and refills have been done if requested.  Please have the pharmacy call with any other refills you may need.  Please keep your appointments with your specialists as you may have planned  Please go to the LAB at the blood drawing area for the tests to be done - at the [facility]  You will be contacted by phone if any changes need to be made immediately.  Otherwise, you will receive a letter about your results with an explanation, but please check with MyChart first.  Please remember to sign up for MyChart if you have not done so, as this will be important to you in the future with finding out test results, communicating by private email, and scheduling acute appointments online when needed.

## 2019-11-08 ENCOUNTER — Telehealth: Payer: Self-pay | Admitting: Internal Medicine

## 2019-11-08 NOTE — Telephone Encounter (Signed)
Patient is calling again to follow up on getting a call back.

## 2019-11-08 NOTE — Telephone Encounter (Signed)
Unfortunately, if the antibiotic and steroid tx have not helped, it must be the shingles and will take at least 2-3 wks to improved overall

## 2019-11-08 NOTE — Telephone Encounter (Signed)
New Message:    Pt is calling and would like to know the results of her labs. She would like to know what else she needs to do because nothing the Dr did on Friday helped with the bumps at all. Please advise.

## 2019-11-09 ENCOUNTER — Telehealth: Payer: Self-pay

## 2019-11-09 ENCOUNTER — Ambulatory Visit: Payer: 59 | Admitting: Internal Medicine

## 2019-11-09 ENCOUNTER — Encounter: Payer: Self-pay | Admitting: Internal Medicine

## 2019-11-09 ENCOUNTER — Other Ambulatory Visit: Payer: Self-pay

## 2019-11-09 VITALS — BP 180/100 | HR 67 | Temp 98.4°F | Ht 65.0 in | Wt 228.0 lb

## 2019-11-09 DIAGNOSIS — H543 Unqualified visual loss, both eyes: Secondary | ICD-10-CM | POA: Diagnosis not present

## 2019-11-09 DIAGNOSIS — B029 Zoster without complications: Secondary | ICD-10-CM

## 2019-11-09 DIAGNOSIS — H547 Unspecified visual loss: Secondary | ICD-10-CM | POA: Insufficient documentation

## 2019-11-09 DIAGNOSIS — I10 Essential (primary) hypertension: Secondary | ICD-10-CM

## 2019-11-09 DIAGNOSIS — R739 Hyperglycemia, unspecified: Secondary | ICD-10-CM

## 2019-11-09 LAB — CBC WITH DIFFERENTIAL/PLATELET
Basophils Absolute: 0 10*3/uL (ref 0.0–0.1)
Basophils Relative: 0.7 % (ref 0.0–3.0)
Eosinophils Absolute: 0 10*3/uL (ref 0.0–0.7)
Eosinophils Relative: 0.2 % (ref 0.0–5.0)
HCT: 39.2 % (ref 36.0–46.0)
Hemoglobin: 13.2 g/dL (ref 12.0–15.0)
Lymphocytes Relative: 20.2 % (ref 12.0–46.0)
Lymphs Abs: 1.3 10*3/uL (ref 0.7–4.0)
MCHC: 33.7 g/dL (ref 30.0–36.0)
MCV: 88 fl (ref 78.0–100.0)
Monocytes Absolute: 0.4 10*3/uL (ref 0.1–1.0)
Monocytes Relative: 6 % (ref 3.0–12.0)
Neutro Abs: 4.6 10*3/uL (ref 1.4–7.7)
Neutrophils Relative %: 72.9 % (ref 43.0–77.0)
Platelets: 180 10*3/uL (ref 150.0–400.0)
RBC: 4.46 Mil/uL (ref 3.87–5.11)
RDW: 13.8 % (ref 11.5–15.5)
WBC: 6.3 10*3/uL (ref 4.0–10.5)

## 2019-11-09 LAB — BASIC METABOLIC PANEL
BUN: 17 mg/dL (ref 6–23)
CO2: 25 mEq/L (ref 19–32)
Calcium: 9.1 mg/dL (ref 8.4–10.5)
Chloride: 106 mEq/L (ref 96–112)
Creatinine, Ser: 0.74 mg/dL (ref 0.40–1.20)
GFR: 80.02 mL/min (ref >60.00)
Glucose, Bld: 171 mg/dL — ABNORMAL HIGH (ref 70–99)
Potassium: 4 mEq/L (ref 3.5–5.1)
Sodium: 138 mEq/L (ref 135–145)

## 2019-11-09 LAB — SEDIMENTATION RATE: Sed Rate: 19 mm/hr (ref 0–30)

## 2019-11-09 MED ORDER — LIDOCAINE 5 % EX PTCH: 1.0000 | MEDICATED_PATCH | CUTANEOUS | 0 refills | Status: DC

## 2019-11-09 MED ORDER — METHADONE HCL 10 MG PO TABS: 10.0000 mg | ORAL_TABLET | Freq: Three times a day (TID) | ORAL | 0 refills | Status: DC

## 2019-11-09 NOTE — Assessment & Plan Note (Signed)
stable overall by history and exam, recent data reviewed with pt, and pt to continue medical treatment as before,  to f/u any worsening symptoms or concerns  

## 2019-11-09 NOTE — Patient Instructions (Addendum)
Please take all new medication as prescribed - the methadone for pain, and lidoderm patch as needed  Please continue all other medications as before, including the valtrex, zpack, prednisone, and ibuprofen as needed  Please have the pharmacy call with any other refills you may need.  Please keep your appointments with your specialists as you may have planned  You will be contacted regarding the referral for: Dr Dione Booze asap  Please go to the LAB at the blood drawing area for the tests to be done  You will be contacted by phone if any changes need to be made immediately.  Otherwise, you will receive a letter about your results with an explanation, but please check with MyChart first.  Please remember to sign up for MyChart if you have not done so, as this will be important to you in the future with finding out test results, communicating by private email, and scheduling acute appointments online when needed.

## 2019-11-09 NOTE — Telephone Encounter (Signed)
New message    Seen today   Needs the CMA to call to discuss new medication methadone (DOLOPHINE) 10 MG tablet  Medication will need prior authorization   [id number] - [place], [place] - [address]  Asking for a call back today

## 2019-11-09 NOTE — Assessment & Plan Note (Signed)
Due to edematous bilateral upper eyelids right > left likely dependent edema it seems, for optho eval

## 2019-11-09 NOTE — Assessment & Plan Note (Addendum)
Somewhat improved but pain persists and intolerant of all common pain meds, for methadone bid prn, lidoderm asd, and continue valtrex, pred, ibuprofen, zpack, vaseline  I spent 31 minutes in preparing to see the patient by review of recent labs, imaging and procedures, obtaining and reviewing separately obtained history, communicating with the patient and family or caregiver, ordering medications, tests or procedures, and documenting clinical information in the EHR including the differential Dx, treatment, and any further evaluation and other management of shingles, impaired vision, htn, hyperglycemia

## 2019-11-09 NOTE — Progress Notes (Signed)
Subjective:    Patient ID: [name], female    DOB: 1961/03/20, 59 y.o.   MRN: [id number]  HPI  Here to f/u rash to right forehead now improved somewhat with several scabbing areas but pain to right forehead, temple and right near scalp quite severe, and now with swelling to right eyelid enough to cause some vision impairment, o/w no other blurred vision or eye pain.  Only started meds yesterday.  Pt denies chest pain, increased sob or doe, wheezing, orthopnea, PND, increased LE swelling, palpitations, dizziness or syncope.  Pt denies new neurological symptoms such as new headache, or facial or extremity weakness or numbness   Pt denies polydipsia, polyuria, or low sugar symptoms such as weakness or confusion improved with po intake.  Pt states overall good compliance with meds, trying to follow lower cholesterol, diabetic diet, wt overall stable but little exercise however.     Recent sed rate and cbc normal Past Medical History:  Diagnosis Date  . Anemia   . ANEMIA-IRON DEFICIENCY 01/03/2007  . Anxiety   . ANXIETY 01/03/2007  . Complication of anesthesia    severe post-operative nausea except for bladder surgery in 2002 at [facility]-did not have complications then  . Depression   . DEPRESSION 01/03/2007  . DJD (degenerative joint disease)    knee  . Enlarged thyroid 10/18/2019  . Family history of adverse reaction to anesthesia    mother and father both had severe post-operative nausea and vomiting  . GERD 01/16/2009  . GERD (gastroesophageal reflux disease)   . Headache(784.0) 01/03/2007  . Hyperplastic colon polyp    04/2010  . HYPERSOMNIA 04/04/2010  . Hypertension   . IBS 01/16/2009  . IBS (irritable bowel syndrome)   . Impaired glucose tolerance 05/18/2011  . Menopause    early  . Migraine   . Obesity   . 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 PONV (postoperative nausea and vomiting)   . Wheezing 07/02/2010   Past Surgical History:  Procedure Laterality Date  .  ABDOMINAL HYSTERECTOMY    . APPENDECTOMY    . bladder tac    . CHOLECYSTECTOMY    . KNEE ARTHROSCOPY Bilateral   . OOPHORECTOMY      reports that she has never smoked. She has never used smokeless tobacco. She reports that she does not drink alcohol or use drugs. family history includes Diabetes in her brother; Hypertension in her mother. Allergies  Allergen Reactions  . Meperidine Shortness Of Breath  . Morphine Anaphylaxis  . Penicillins Hives, Nausea Only, Swelling and Other (See Comments)    PATIENT HAS HAD A PCN REACTION WITH IMMEDIATE RASH, FACIAL/TONGUE/THROAT SWELLING, SOB, OR LIGHTHEADEDNESS WITH HYPOTENSION:  #  #  YES  #  #  HAS PT DEVELOPED SEVERE RASH INVOLVING MUCUS MEMBRANES or SKIN NECROSIS: #  #  YES  #  #  PATIENT HAS HAD A PCN REACTION THAT REQUIRED HOSPITALIZATION:  #  #  YES  #  #  Has patient had a PCN reaction occurring within the last 10 years: No   . Lisinopril Cough       . Doxycycline     nausea  . Gabapentin Other (See Comments)    Muscle pain, fatigue  . Amoxicillin Nausea Only  . Codeine Nausea Only and Other (See Comments)    Hot flashes also  . Hydrocodone Itching, Nausea Only and Rash  . Omeprazole Itching and Rash  . Oxycodone-Acetaminophen Nausea And Vomiting and Rash  .  Sucralfate Rash and Other (See Comments)    Blurry vision   . Tramadol Itching, Nausea Only and Rash   Current Outpatient Medications on File Prior to Visit  Medication Sig Dispense Refill  . acetaminophen (TYLENOL) 500 MG tablet Take 1,000 mg by mouth daily as needed for moderate pain or headache.    Marland Kitchen azithromycin (ZITHROMAX) 250 MG tablet 2 tab by mouth day 1, then 1 per day 6 tablet 0  . celecoxib (CELEBREX) 100 MG capsule Take 100 mg by mouth every 12 (twelve) hours.    . cetirizine (ZYRTEC) 10 MG tablet Take 1 tablet (10 mg total) by mouth daily. 30 tablet 11  . cyclobenzaprine (FLEXERIL) 10 MG tablet Take 1/2-1 tab twice daily as needed for muscle spasm. 40 tablet 1    . esomeprazole (NEXIUM) 20 MG capsule Take 20 mg by mouth daily.     Marland Kitchen estradiol (ESTRACE) 1 MG tablet Take 1 mg by mouth daily.     Marland Kitchen ibuprofen (ADVIL) 800 MG tablet Take 1 tablet (800 mg total) by mouth every 8 (eight) hours as needed. 40 tablet 1  . IMVEXXY MAINTENANCE PACK 10 MCG INST INSERT 1 VAGINAL INSERT(S) TWICE A WEEK BY VAGINAL ROUTE.    Marland Kitchen losartan (COZAAR) 50 MG tablet Take 1 tablet (50 mg total) by mouth daily. New dose, d/c previous rx 90 tablet 3  . predniSONE (DELTASONE) 10 MG tablet 3 tabs by mouth per day for 3 days,2tabs per day for 3 days,1tab per day for 3 days 18 tablet 0  . Probiotic CAPS Take 1 capsule by mouth daily.    . promethazine (PHENERGAN) 25 MG tablet Take 25 mg by mouth daily as needed for nausea or vomiting.    . valACYclovir (VALTREX) 1000 MG tablet Take 1,000 mg by mouth 3 (three) times daily.    . Vitamin D, Ergocalciferol, (DRISDOL) 1.25 MG (50000 UNIT) CAPS capsule Take 50,000 Units by mouth once a week.     No current facility-administered medications on file prior to visit.   Review of Systems All otherwise neg per pt    Objective:   Physical Exam BP (!) 180/100 (BP Location: Left Arm, Patient Position: Sitting, Cuff Size: Large)   Pulse 67   Temp 98.4 F (36.9 C) (Oral)   Ht 5\' 5"  (1.651 m)   Wt 228 lb (103.4 kg)   SpO2 98%   BMI 37.94 kg/m  VS noted,  Constitutional: Pt appears in NAD HENT: Head: NCAT.  Right Ear: External ear normal.  Left Ear: External ear normal.  Eyes: . Pupils are equal, round, and reactive to light. Conjunctivae and EOM are normal but right upper eyelid 2+ swelling nontender, also has some swelling to left upper eyelid as well c/w dependent edema Nose: without d/c or deformity Neck: Neck supple. Gross normal ROM Cardiovascular: Normal rate and regular rhythm.   Pulmonary/Chest: Effort normal and breath sounds without rales or wheezing.  Neurological: Pt is alert. At baseline orientation, motor grossly  intact Skin:  no LE edema, right forehead with decreased erythema swelling with 3 discrete small areas scabbing, also several scabbing erythema noted right temple and scalp above the ear Psychiatric: Pt behavior is normal without agitation  All otherwise neg per pt  Lab Results  Component Value Date   WBC 6.0 11/05/2019   HGB 13.3 11/05/2019   HCT 40.2 11/05/2019   PLT 180 11/05/2019   GLUCOSE 114 (H) 09/22/2019   CHOL 193 09/22/2019   TRIG 213 (  H) 09/22/2019   HDL 54 09/22/2019   LDLDIRECT 97.0 09/15/2019   LDLCALC 103 (H) 09/22/2019   ALT 11 09/22/2019   AST 17 09/22/2019   NA 139 09/22/2019   K 4.1 09/22/2019   CL 102 09/22/2019   CREATININE 0.74 09/22/2019   BUN 9 09/22/2019   CO2 23 09/22/2019   TSH 1.45 09/15/2019   HGBA1C 6.2 09/15/2019   MICROALBUR <0.7 09/15/2019       Assessment & Plan:

## 2019-11-09 NOTE — Telephone Encounter (Signed)
New message:   Pt is calling to check on the status of the doctor calling for the prior authorization of this medication methadone (DOLOPHINE) 10 MG tablet.  She states they will not fill this medication until the auth has been done. She also states she is in so much pain that she can't wait any longer. Please advise.

## 2019-11-10 NOTE — Telephone Encounter (Signed)
PA for both the lidocaine patches and the methadone have been completed.   Methadone will not be approved due to the associated dx for the medication. Pt will need to pay out of pocket.   Lidocaine PA has been approved.   Pt has been informed of all above. Pt stated the methadone was $12 out of pocket

## 2019-11-10 NOTE — Telephone Encounter (Signed)
   Patient calling, states is coming to the office tomorrow if she doesn't get a response regarding prior auth  Methadone

## 2019-11-23 ENCOUNTER — Encounter: Payer: Self-pay | Admitting: Family

## 2019-11-23 ENCOUNTER — Ambulatory Visit: Payer: 59 | Admitting: Family

## 2019-11-23 ENCOUNTER — Other Ambulatory Visit: Payer: Self-pay

## 2019-11-23 VITALS — BP 148/88 | HR 90 | Temp 98.4°F | Ht 65.0 in | Wt 228.6 lb

## 2019-11-23 DIAGNOSIS — I10 Essential (primary) hypertension: Secondary | ICD-10-CM

## 2019-11-23 DIAGNOSIS — F418 Other specified anxiety disorders: Secondary | ICD-10-CM | POA: Diagnosis not present

## 2019-11-23 DIAGNOSIS — B370 Candidal stomatitis: Secondary | ICD-10-CM | POA: Diagnosis not present

## 2019-11-23 DIAGNOSIS — B0229 Other postherpetic nervous system involvement: Secondary | ICD-10-CM | POA: Diagnosis not present

## 2019-11-23 MED ORDER — MAGIC MOUTHWASH: 5.0000 mL | Freq: Four times a day (QID) | ORAL | 0 refills | Status: DC | PRN

## 2019-11-23 MED ORDER — ALPRAZOLAM 0.5 MG PO TABS: 0.5000 mg | ORAL_TABLET | Freq: Two times a day (BID) | ORAL | 0 refills | Status: DC | PRN

## 2019-11-23 NOTE — Progress Notes (Signed)
[name] is a 59 y.o. female with the following history as recorded in Epic[employer]:  Patient Active Problem List   Diagnosis Date Noted  . Impaired vision in both eyes 11/09/2019  . Rash 11/05/2019  . Enlarged thyroid 10/18/2019  . Tenosynovitis of thumb 09/22/2019  . Angioedema 04/21/2019  . Blurred vision 05/20/2018  . Allergic rhinitis 05/20/2018  . Degenerative spondylolisthesis 03/03/2018  . Preop exam for internal medicine 02/18/2018  . Shingles 02/09/2018  . 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 Tinea cruris 01/05/2018  . Contact dermatitis 12/24/2017  . Surgical menopause 03/05/2017  . Vitamin D deficiency 03/05/2017  . Dizziness 06/22/2016  . Nausea without vomiting 06/22/2016  . Hyperglycemia 02/29/2016  . Hypertriglyceridemia 02/29/2016  . Dyspnea on exertion 04/15/2015  . Essential hypertension 04/10/2015  . Delayed gastric emptying 12/16/2012  . Chronic pain 05/08/2012  . Muscle spasm of back 05/08/2012  . Chest pain 05/08/2012  . Gastroparesis 05/22/2011  . Preventative health care 05/18/2011  . HYPERSOMNIA 04/04/2010  . GERD 01/16/2009  . IBS 01/16/2009  . 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 ANEMIA-IRON DEFICIENCY 01/03/2007  . ANXIETY 01/03/2007  . DEPRESSION 01/03/2007    Current Outpatient Medications  Medication Sig Dispense Refill  . acetaminophen (TYLENOL) 500 MG tablet Take 1,000 mg by mouth daily as needed for moderate pain or headache.    . celecoxib (CELEBREX) 100 MG capsule Take 100 mg by mouth every 12 (twelve) hours.    . cetirizine (ZYRTEC) 10 MG tablet Take 1 tablet (10 mg total) by mouth daily. 30 tablet 11  . cyclobenzaprine (FLEXERIL) 10 MG tablet Take 1/2-1 tab twice daily as needed for muscle spasm. 40 tablet 1  . esomeprazole (NEXIUM) 20 MG capsule Take 20 mg by mouth daily.     Marland Kitchen estradiol (ESTRACE) 1 MG tablet Take 1 mg by mouth daily.     Creig Hines MAINTENANCE PACK 10 MCG INST INSERT 1 VAGINAL INSERT(S)  TWICE A WEEK BY VAGINAL ROUTE.    Marland Kitchen losartan (COZAAR) 50 MG tablet Take 1 tablet (50 mg total) by mouth daily. New dose, d/c previous rx 90 tablet 3  . Probiotic CAPS Take 1 capsule by mouth daily.    . promethazine (PHENERGAN) 25 MG tablet Take 25 mg by mouth daily as needed for nausea or vomiting.    . valACYclovir (VALTREX) 1000 MG tablet Take 1,000 mg by mouth 3 (three) times daily.    . Vitamin D, Ergocalciferol, (DRISDOL) 1.25 MG (50000 UNIT) CAPS capsule Take 50,000 Units by mouth once a week.    . ALPRAZolam (XANAX) 0.5 MG tablet Take 1 tablet (0.5 mg total) by mouth 2 (two) times daily as needed for anxiety. 40 tablet 0  . ibuprofen (ADVIL) 800 MG tablet Take 1 tablet (800 mg total) by mouth every 8 (eight) hours as needed. (Patient not taking: Reported on 11/23/2019) 40 tablet 1  . lidocaine (LIDODERM) 5 % Place 1 patch onto the skin daily. Remove & Discard patch within 12 hours or as directed by MD (Patient not taking: Reported on 11/23/2019) 30 patch 0  . magic mouthwash SOLN Take 5 mLs by mouth 4 (four) times daily as needed for mouth pain. 150 mL 0  . methadone (DOLOPHINE) 10 MG tablet Take 1 tablet (10 mg total) by mouth every 8 (eight) hours. (Patient not taking: Reported on 11/23/2019) 15 tablet 0   No current facility-administered medications for this visit.    Allergies: Meperidine, Morphine, Penicillins, Lisinopril, Doxycycline, Gabapentin, Amoxicillin,  Codeine, Hydrocodone, Omeprazole, Oxycodone-acetaminophen, Sucralfate, and Tramadol  Past Medical History:  Diagnosis Date  . Anemia   . ANEMIA-IRON DEFICIENCY 01/03/2007  . Anxiety   . ANXIETY 01/03/2007  . Complication of anesthesia    severe post-operative nausea except for bladder surgery in 2002 at [facility]-did not have complications then  . Depression   . DEPRESSION 01/03/2007  . DJD (degenerative joint disease)    knee  . Enlarged thyroid 10/18/2019  . Family history of adverse reaction to anesthesia    mother and  father both had severe post-operative nausea and vomiting  . GERD 01/16/2009  . GERD (gastroesophageal reflux disease)   . Headache(784.0) 01/03/2007  . Hyperplastic colon polyp    04/2010  . HYPERSOMNIA 04/04/2010  . Hypertension   . IBS 01/16/2009  . IBS (irritable bowel syndrome)   . Impaired glucose tolerance 05/18/2011  . Menopause    early  . Migraine   . Obesity   . 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 PONV (postoperative nausea and vomiting)   . Wheezing 07/02/2010    Past Surgical History:  Procedure Laterality Date  . ABDOMINAL HYSTERECTOMY    . APPENDECTOMY    . bladder tac    . CHOLECYSTECTOMY    . KNEE ARTHROSCOPY Bilateral   . OOPHORECTOMY      Family History  Problem Relation Age of Onset  . Hypertension Mother   . Diabetes Brother     Social History   Tobacco Use  . Smoking status: Never Smoker  . Smokeless tobacco: Never Used  Substance Use Topics  . Alcohol use: No    Alcohol/week: 0.0 standard drinks    Subjective:  Patient was diagnosed with shingles on 11/09/2019; has struggled with chronic pain/ neuralgia but treatment has been complicated by numerous allergies; has been given prescription for Lidocaine patches but opted not to use; has also been given Rx for Methadone but opting not to use;  Is currently on Valtrex 1 gm bid x 3 more weeks per ophthalmology;  Notes that stress level is extremely high right now- son had a massive seizure on Sunday; she is aware that continued increase stress is going to make healing harder; she notes she and her PCP talked about Xanax in the recent past and she wonders about short-term trial of this prescription.  Also complaining of painful sores in her mouth- brings in picture last week that is c/w thrush; did recently take prednisone and Z-pak;      Objective:  Vitals:   11/23/19 1235  BP: (!) 148/88  Pulse: 90  Temp: 98.4 F (36.9 C)  TempSrc: Oral  SpO2: 98%  Weight: 228 lb 9.6 oz (103.7 kg)   Height: 5\' 5"  (1.651 m)    General: Well developed, well nourished, in no acute distress  Skin : Warm and dry. Scabbed lesions noted over right eye Head: Normocephalic and atraumatic  Eyes: Sclera and conjunctiva clear; pupils round and reactive to light; extraocular movements intact  Ears: External normal; canals clear; tympanic membranes normal  Oropharynx: Pink, supple. No suspicious lesions  Neck: Supple without thyromegaly, adenopathy  Lungs: Respirations unlabored;  Neurologic: Alert and oriented; speech intact; face symmetrical; moves all extremities well; CNII-XII intact without focal deficit   Assessment:  1. HZV (herpes zoster virus) post herpetic neuralgia   2. Oral thrush   3. Situational anxiety   4. Essential hypertension     Plan:  1. Patient is visibly upset and frustrated with how her  diagnosis/ treatment were handled;  Will go ahead and refer to pain management to try and get immediate relief benefit as her treatment regimen is complicated by her allergies; 2. Rx for Magic Mouthwash- use as directed; 3. Rx for Xanax 0.5 mg bid prn; she may be a candidate for Cymbalta to help with pain and anxiety but will let pain management make decision. 4. ? Control; she is to continue monitoring and may need to increase Losartan to 100 mg daily;  This visit occurred during the SARS-CoV-2 public health emergency.  Safety protocols were in place, including screening questions prior to the visit, additional usage of staff PPE, and extensive cleaning of exam room while observing appropriate contact time as indicated for disinfecting solutions.     No follow-ups on file.  Orders Placed This Encounter  Procedures  . Ambulatory referral to Pain Clinic    Referral Priority:   Urgent    Referral Type:   Consultation    Referral Reason:   Specialty Services Required    Requested Specialty:   Pain Medicine    Number of Visits Requested:   1    Requested Prescriptions   Signed  Prescriptions Disp Refills  . magic mouthwash SOLN 150 mL 0    Sig: Take 5 mLs by mouth 4 (four) times daily as needed for mouth pain.  Marland Kitchen ALPRAZolam (XANAX) 0.5 MG tablet 40 tablet 0    Sig: Take 1 tablet (0.5 mg total) by mouth 2 (two) times daily as needed for anxiety.

## 2019-11-25 ENCOUNTER — Other Ambulatory Visit: Payer: Self-pay | Admitting: Internal Medicine

## 2019-12-04 ENCOUNTER — Other Ambulatory Visit: Payer: 59

## 2019-12-28 ENCOUNTER — Other Ambulatory Visit: Payer: Self-pay | Admitting: Cardiovascular Disease

## 2020-01-24 ENCOUNTER — Ambulatory Visit
Admission: RE | Admit: 2020-01-24 | Discharge: 2020-01-24 | Disposition: A | Discharge status: Home / Self Care | Payer: 59 | Source: Ambulatory Visit | Attending: Student | Admitting: Student

## 2020-01-24 ENCOUNTER — Other Ambulatory Visit: Payer: Self-pay

## 2020-01-24 ENCOUNTER — Other Ambulatory Visit: Payer: 59

## 2020-01-24 DIAGNOSIS — M5416 Radiculopathy, lumbar region: Secondary | ICD-10-CM

## 2020-01-27 ENCOUNTER — Other Ambulatory Visit: Payer: Self-pay

## 2020-01-27 ENCOUNTER — Ambulatory Visit: Payer: 59 | Admitting: Podiatry

## 2020-01-27 ENCOUNTER — Ambulatory Visit (INDEPENDENT_AMBULATORY_CARE_PROVIDER_SITE_OTHER): Payer: 59

## 2020-01-27 DIAGNOSIS — M722 Plantar fascial fibromatosis: Secondary | ICD-10-CM

## 2020-01-27 DIAGNOSIS — M19079 Primary osteoarthritis, unspecified ankle and foot: Secondary | ICD-10-CM

## 2020-01-27 DIAGNOSIS — Z8781 Personal history of (healed) traumatic fracture: Secondary | ICD-10-CM

## 2020-01-27 DIAGNOSIS — M779 Enthesopathy, unspecified: Secondary | ICD-10-CM

## 2020-01-30 NOTE — Progress Notes (Signed)
Subjective:   Patient ID: [name], female   DOB: 59 y.o.   MRN: [id number]   HPI 59 year old female presents the office today for concerns of left foot pain, plantar fasciitis.  She states that she has been seen by another podiatrist for quite some time she has been told she has multiple fractures she will get to her feet.  At her last appointment on x-rays she apparently had some osteopenia and her doctor wanted her to start medication for osteoporosis although she states that her last DEXA scan has been normal.  There has not to start the medication given side effects.  She been having issues recently with her left heel and foot.  She has been wearing a boot and she previously had an injection.  She states that she walks barefoot hurts.  She denies any recent injury or falls.   Review of Systems  All other systems reviewed and are negative.  Past Medical History:  Diagnosis Date   Anemia    ANEMIA-IRON DEFICIENCY 01/03/2007   Anxiety    ANXIETY 01/03/2007   Complication of anesthesia    severe post-operative nausea except for bladder surgery in 2002 at [facility]-did not have complications then   Depression    DEPRESSION 01/03/2007   DJD (degenerative joint disease)    knee   Enlarged thyroid 10/18/2019   Family history of adverse reaction to anesthesia    mother and father both had severe post-operative nausea and vomiting   GERD 01/16/2009   GERD (gastroesophageal reflux disease)    Headache(784.0) 01/03/2007   Hyperplastic colon polyp    04/2010   HYPERSOMNIA 04/04/2010   Hypertension    IBS 01/16/2009   IBS (irritable bowel syndrome)    Impaired glucose tolerance 05/18/2011   Menopause    early   Migraine    Obesity    OSTEOARTHRITIS, KNEES, 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   PONV (postoperative nausea and vomiting)    Wheezing 07/02/2010    Past Surgical History:  Procedure Laterality Date   ABDOMINAL HYSTERECTOMY     APPENDECTOMY     bladder tac      CHOLECYSTECTOMY     KNEE ARTHROSCOPY Bilateral    OOPHORECTOMY       Current Outpatient Medications:    Abaloparatide (TYMLOS) 3120 MCG/1.56ML SOPN, , Disp: , Rfl:    acetaminophen (TYLENOL) 500 MG tablet, Take 1,000 mg by mouth daily as needed for moderate pain or headache., Disp: , Rfl:    ALPRAZolam (XANAX) 0.5 MG tablet, Take 1 tablet (0.5 mg total) by mouth 2 (two) times daily as needed for anxiety., Disp: 40 tablet, Rfl: 0   celecoxib (CELEBREX) 100 MG capsule, Take 100 mg by mouth every 12 (twelve) hours., Disp: , Rfl:    cetirizine (ZYRTEC) 10 MG tablet, Take 1 tablet (10 mg total) by mouth daily., Disp: 30 tablet, Rfl: 11   cyclobenzaprine (FLEXERIL) 10 MG tablet, Take 1/2-1 tab twice daily as needed for muscle spasm., Disp: 40 tablet, Rfl: 1   doxycycline (VIBRA-TABS) 100 MG tablet, doxycycline hyclate 100 mg tablet  TAKE 1 TABLET BY MOUTH TWICE A DAY, Disp: , Rfl:    esomeprazole (NEXIUM) 20 MG capsule, Take 20 mg by mouth daily. , Disp: , Rfl:    estradiol (ESTRACE) 1 MG tablet, Take 1 mg by mouth daily. , Disp: , Rfl:    hydrocortisone 2.5 % cream, hydrocortisone 2.5 % topical cream, Disp: , Rfl:    ibuprofen (ADVIL) 800 MG tablet, Take  1 tablet (800 mg total) by mouth every 8 (eight) hours as needed. (Patient not taking: Reported on 11/23/2019), Disp: 40 tablet, Rfl: 1   IMVEXXY MAINTENANCE PACK 10 MCG INST, INSERT 1 VAGINAL INSERT(S) TWICE A WEEK BY VAGINAL ROUTE., Disp: , Rfl:    ketoconazole (NIZORAL) 2 % cream, ketoconazole 2 % topical cream, Disp: , Rfl:    lidocaine (LIDODERM) 5 %, Place 1 patch onto the skin daily. Remove & Discard patch within 12 hours or as directed by MD (Patient not taking: Reported on 11/23/2019), Disp: 30 patch, Rfl: 0   losartan (COZAAR) 50 MG tablet, Take 1 tablet (50 mg total) by mouth daily., Disp: 30 tablet, Rfl: 6   magic mouthwash SOLN, Take 5 mLs by mouth 4 (four) times daily as needed for mouth pain., Disp: 150 mL, Rfl:  0   methadone (DOLOPHINE) 10 MG tablet, Take 1 tablet (10 mg total) by mouth every 8 (eight) hours. (Patient not taking: Reported on 11/23/2019), Disp: 15 tablet, Rfl: 0   Probiotic CAPS, Take 1 capsule by mouth daily., Disp: , Rfl:    promethazine (PHENERGAN) 25 MG tablet, Take 25 mg by mouth daily as needed for nausea or vomiting., Disp: , Rfl:    valACYclovir (VALTREX) 1000 MG tablet, Take 1,000 mg by mouth 3 (three) times daily., Disp: , Rfl:    Vitamin D, Ergocalciferol, (DRISDOL) 1.25 MG (50000 UNIT) CAPS capsule, Take 50,000 Units by mouth once a week., Disp: , Rfl:   Allergies  Allergen Reactions   Meperidine Shortness Of Breath   Morphine Anaphylaxis   Penicillins Hives, Nausea Only, Swelling and Other (See Comments)    PATIENT HAS HAD A PCN REACTION WITH IMMEDIATE RASH, FACIAL/TONGUE/THROAT SWELLING, SOB, OR LIGHTHEADEDNESS WITH HYPOTENSION:  #  #  YES  #  #  HAS PT DEVELOPED SEVERE RASH INVOLVING MUCUS MEMBRANES or SKIN NECROSIS: #  #  YES  #  #  PATIENT HAS HAD A PCN REACTION THAT REQUIRED HOSPITALIZATION:  #  #  YES  #  #  Has patient had a PCN reaction occurring within the last 10 years: No    Lisinopril Cough        Doxycycline     nausea   Gabapentin Other (See Comments)    Muscle pain, fatigue   Other    Amoxicillin Nausea Only   Codeine Nausea Only and Other (See Comments)    Hot flashes also   Hydrocodone Itching, Nausea Only and Rash   Omeprazole Itching and Rash   Oxycodone-Acetaminophen Nausea And Vomiting and Rash   Sucralfate Rash and Other (See Comments)    Blurry vision    Tramadol Itching, Nausea Only and Rash         Objective:  Physical Exam  General: AAO x3, NAD- daughter is present   Dermatological: Skin is warm, dry and supple bilateral. There are no open sores, no preulcerative lesions, no rash or signs of infection present.  Vascular: Dorsalis Pedis artery and Posterior Tibial artery pedal pulses are 2/4 bilateral with  immedate capillary fill time. There is no pain with calf compression, swelling, warmth, erythema.   Neruologic: Grossly intact via light touch bilateral. Negative tinel sign.   Musculoskeletal: There is minimal tenderness along the course or insertion of the plantar fascia.  There is no pain to the Achilles tendon.  She does get some discomfort on the distal portion of the peroneal tendon insertion with fifth metatarsal base.  MMT 5/5.  Flexor, extensor  tendons appear to be intact.   Gait: Unassisted, Nonantalgic.       Assessment:   59 year old female with chronic left foot pain     Plan:  -Treatment options discussed including all alternatives, risks, and complications -Etiology of symptoms were discussed -X-rays were obtained and reviewed with the patient.  Sclerotic line evident in the fifth metatarsal base likely old fracture also could be stress fracture.  Arthritic changes present midfoot. -Ultimately I discussed with her I would hold off any medication prescribed for osteoporosis or osteopenia unless approved by her primary care physician.  With her history of having a normal DEXA scan I do not think this is appropriate at this point however she could discuss with her PCP. -I will hold off on steroid injections for now.  She is been immobilized.  I believe going forward I do think an orthotic can be helpful for her and her daughter is actually asking office today.  She was seen today by Raiford Noble our ped orthotist the imaging for this.  Discussion on stretching, icing exercises.  Discussed more supportive shoes.  Also consider formal physical therapy.    Vivi Barrack DPM

## 2020-02-17 ENCOUNTER — Other Ambulatory Visit: Payer: Self-pay

## 2020-02-17 ENCOUNTER — Ambulatory Visit: Payer: 59 | Admitting: Orthotics

## 2020-02-17 DIAGNOSIS — M722 Plantar fascial fibromatosis: Secondary | ICD-10-CM

## 2020-02-17 NOTE — Progress Notes (Signed)
Patient came in today to pick up custom made foot orthotics.  The goals were accomplished and the patient reported no dissatisfaction with said orthotics.  Patient was advised of breakin period and how to report any issues. 

## 2020-02-21 ENCOUNTER — Telehealth: Payer: Self-pay | Admitting: Cardiovascular Disease

## 2020-02-21 NOTE — Telephone Encounter (Signed)
lvm for patient to return call to get follow up scheduled with [place] from recall list  

## 2020-02-25 ENCOUNTER — Ambulatory Visit: Payer: 59 | Admitting: Podiatry

## 2020-02-25 ENCOUNTER — Other Ambulatory Visit: Payer: Self-pay

## 2020-02-25 ENCOUNTER — Encounter: Payer: Self-pay | Admitting: Podiatry

## 2020-02-25 DIAGNOSIS — M7672 Peroneal tendinitis, left leg: Secondary | ICD-10-CM

## 2020-02-25 DIAGNOSIS — M722 Plantar fascial fibromatosis: Secondary | ICD-10-CM

## 2020-02-25 NOTE — Progress Notes (Signed)
Subjective:   Patient ID: [name], female   DOB: 59 y.o.   MRN: [id number]   HPI Patient presents stating she has developed pain more now on the outside of her left foot and also the heel has still been bothering her but not to the same degree.  States he also has become very sore in the boot is not tolerable to wear with pressure   ROS      Objective:  Physical Exam  Neurovascular status intact with patient found to have quite a bit of inflammation around the peroneal tendon insertion left into the base of the fifth metatarsal with also mild to moderate pain still noted in the plantar fascial left at the insertion of the tendon into the calcaneus.  Patient has good digital perfusion well oriented x3     Assessment:  Peroneal tendinitis left with inflammation at the base of fifth metatarsal along with moderate plantar fasciitis more chronic in nature left     Plan:  H&P and reviewed conditions.  At this point for the acute peroneal tendinitis which I am hoping is compensatory to plantar fasciitis I did do sterile prep and I injected the plantar fascia at insertion 3 mg Dexasone Kenalog 5 mg Xylocaine and applied fascial brace to lift up the lateral side of the foot.  For the plantar fascial we will continue with stretching exercises shoe gear modifications topical medications and oral medicine as needed.  Patient will be seen back for [place] to recheck again as needed and may require other treatments if symptoms do not improve

## 2020-03-03 ENCOUNTER — Ambulatory Visit: Payer: 59

## 2020-03-03 ENCOUNTER — Ambulatory Visit: Payer: 59 | Admitting: Family

## 2020-03-07 ENCOUNTER — Ambulatory Visit: Payer: 59 | Admitting: Podiatry

## 2020-03-10 ENCOUNTER — Other Ambulatory Visit: Payer: Self-pay

## 2020-03-10 ENCOUNTER — Ambulatory Visit (INDEPENDENT_AMBULATORY_CARE_PROVIDER_SITE_OTHER): Payer: 59

## 2020-03-10 DIAGNOSIS — Z23 Encounter for immunization: Secondary | ICD-10-CM

## 2020-03-17 ENCOUNTER — Ambulatory Visit: Payer: 59 | Admitting: Podiatry

## 2020-03-23 ENCOUNTER — Other Ambulatory Visit: Payer: Self-pay

## 2020-03-23 ENCOUNTER — Ambulatory Visit (INDEPENDENT_AMBULATORY_CARE_PROVIDER_SITE_OTHER): Payer: 59 | Admitting: Podiatry

## 2020-03-23 DIAGNOSIS — M722 Plantar fascial fibromatosis: Secondary | ICD-10-CM | POA: Diagnosis not present

## 2020-03-23 DIAGNOSIS — T148XXA Other injury of unspecified body region, initial encounter: Secondary | ICD-10-CM | POA: Diagnosis not present

## 2020-03-23 DIAGNOSIS — M7672 Peroneal tendinitis, left leg: Secondary | ICD-10-CM | POA: Diagnosis not present

## 2020-03-23 NOTE — Patient Instructions (Signed)
I have ordered a MRI of the left ankle. If you do not hear for them about scheduling within the next 1 week, or you have any questions please give us a call at [phone].   

## 2020-03-23 NOTE — Progress Notes (Signed)
Subjective: 59 year old female presents the office today for concerns of continued pain to the left foot.  She feels that the pain is better and wants to heal.  She had injection last appointment which helped some resolving discomfort. Denies any systemic complaints such as fevers, chills, nausea, vomiting. No acute changes since last appointment, and no other complaints at this time.   Objective: AAO x3, NAD DP/PT pulses palpable bilaterally, CRT less than 3 seconds There is tenderness on vaginal course the peroneal tendon posterior and inferior lateral malleolus and insertion at the metatarsal base.  No discomfort the distal portion of posterior tibial tendon insertion of the navicular.  Tenderness on the plantar fascial insertion the calcaneus.  Achilles tendon appears intact.  No area pinpoint tenderness.  No pain with calf compression, swelling, warmth, erythema  Assessment: Left foot tendinitis, plan fasciitis follow-up care  Plan: -All treatment options discussed with the patient including all alternatives, risks, complications.  -As point given her discomfort recommend mobilization cam boot and short cam boot was dispensed today.  Continue ice elevation.  MRI was ordered today to rule out partial tear and sent potential surgical planning. -Patient encouraged to call the office with any questions, concerns, change in symptoms.   Vivi Barrack DPM

## 2020-04-10 ENCOUNTER — Encounter: Payer: Self-pay | Admitting: Cardiovascular Disease

## 2020-04-10 ENCOUNTER — Other Ambulatory Visit: Payer: Self-pay

## 2020-04-10 ENCOUNTER — Ambulatory Visit (INDEPENDENT_AMBULATORY_CARE_PROVIDER_SITE_OTHER): Payer: 59 | Admitting: Cardiovascular Disease

## 2020-04-10 VITALS — BP 120/84 | HR 65 | Ht 65.0 in | Wt 226.0 lb

## 2020-04-10 DIAGNOSIS — E041 Nontoxic single thyroid nodule: Secondary | ICD-10-CM | POA: Diagnosis not present

## 2020-04-10 DIAGNOSIS — E781 Pure hyperglyceridemia: Secondary | ICD-10-CM

## 2020-04-10 DIAGNOSIS — I10 Essential (primary) hypertension: Secondary | ICD-10-CM | POA: Diagnosis not present

## 2020-04-10 NOTE — Patient Instructions (Addendum)
Medication Instructions:  Your physician recommends that you continue on your current medications as directed. Please refer to the Current Medication list given to you today.  *If you need a refill on your cardiac medications before your next appointment, please call your pharmacy*  Lab Work: NONE   Testing/Procedures: THYROID ULTRASOUND IN April  October 05 2020 ARRIVE 12:40 FOR 1:00 PM  [phone]  Follow-Up: At [facility], you and your health needs are our priority.  As part of our continuing mission to provide you with exceptional heart care, we have created designated Provider Care Teams.  These Care Teams include your primary Cardiologist (physician) and Advanced Practice Providers (APPs -  Physician Assistants and Nurse Practitioners) who all work together to provide you with the care you need, when you need it.  We recommend signing up for the patient portal called "MyChart".  Sign up information is provided on this After Visit Summary.  MyChart is used to connect with patients for Virtual Visits (Telemedicine).  Patients are able to view lab/test results, encounter notes, upcoming appointments, etc.  Non-urgent messages can be sent to your provider as well.   To learn more about what you can do with MyChart, go to [email or web address].    Your next appointment:   12 month(s) You will receive a reminder letter in the mail two months in advance. If you don't receive a letter, please call our office to schedule the follow-up appointment.  The format for your next appointment:   In Person  Provider:   You may see Chilton Si, MD or one of the following Advanced Practice Providers on your designated Care Team:    Corine Shelter, PA-C  [place], [place]  Edd Fabian, [place]

## 2020-04-10 NOTE — Progress Notes (Signed)
Cardiology Office Note   Date:  04/10/2020   ID:  [name], DOB 1961-02-03, MRN [id number]  PCP:  Corwin Levins, MD  Cardiologist:   Chilton Si, MD   No chief complaint on file.   History of Present Illness: [name] is a 59 y.o. female with hypertension, gastroesophageal reflux disease and depression who presents for follow up.  [name] was referred 04/2015 for a complaint of dizziness and dyspnea on exertion.  EKG showed sinus rhythm with low voltage and poor R wave progression.  BNP was 17.  The episodes occurred with exertion and were associated with a flushed feeling.  Her symptoms were felt to be neurocardiogenic and she was asked to wear compression stockings (also to help with her varicose veins) and increase her fluid intake.  Lab testing for pheochromocytoma and carcinoid were negative.  She underwent Lexiscan Cardiolite 04/2015 that was negative for ischemia and echocardiogram 05/2015 revealed grade 1 diastolic dysfunction.  Her blood pressure was intermittently elevated. She reported having a lot of anxiety and stress at the time. She was instructed to follow-up with her PCP or psychiatrist to discuss her anxiety.  Her blood pressure remained elevated so she was started on lisinopril.  This was switched to losartan due to cough.  At her last appointment she reported lower extremity numbness and claudication.  She was referred for ABIs 01/30/17 that were normal bilaterally.  At her last appointment it was noted that her thyroid was enlarged.  She was referred for thyroid ultrasound which revealed multiple bilateral nodules.  She underwent thyroid biopsy which was consistent with benign follicular nodule and it was recommended that she have serial ultrasounds.  At her last appointment she was exercising regularly and feeling well.  Her only complaint was hot flashes that seem to be worse after taking her losartan.  She also had shingles in her face in June. She  has been seeing a pain management specialist in [facility] and will be getting a procedure on her back. She isn't able to exercise much because of her back and her foot continues to bother her.  She is seeing a new doctor about her foot.  She complains of whole body pain.  She also struggles with allergies.  She denies LE edema, orthopnea or PND. She struggles with insomnia.  Past Medical History:  Diagnosis Date  . Anemia   . ANEMIA-IRON DEFICIENCY 01/03/2007  . Anxiety   . ANXIETY 01/03/2007  . Complication of anesthesia    severe post-operative nausea except for bladder surgery in 2002 at [facility]-did not have complications then  . Depression   . DEPRESSION 01/03/2007  . DJD (degenerative joint disease)    knee  . Enlarged thyroid 10/18/2019  . Family history of adverse reaction to anesthesia    mother and father both had severe post-operative nausea and vomiting  . GERD 01/16/2009  . GERD (gastroesophageal reflux disease)   . Headache(784.0) 01/03/2007  . Hyperplastic colon polyp    04/2010  . HYPERSOMNIA 04/04/2010  . Hypertension   . IBS 01/16/2009  . IBS (irritable bowel syndrome)   . Impaired glucose tolerance 05/18/2011  . Menopause    early  . Migraine   . Obesity   . 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 PONV (postoperative nausea and vomiting)   . Wheezing 07/02/2010    Past Surgical History:  Procedure Laterality Date  . ABDOMINAL HYSTERECTOMY    . APPENDECTOMY    .  bladder tac    . CHOLECYSTECTOMY    . KNEE ARTHROSCOPY Bilateral   . OOPHORECTOMY       Current Outpatient Medications  Medication Sig Dispense Refill  . Abaloparatide (TYMLOS) 3120 MCG/1.56ML SOPN     . acetaminophen (TYLENOL) 500 MG tablet Take 1,000 mg by mouth daily as needed for moderate pain or headache.    . ALPRAZolam (XANAX) 0.5 MG tablet Take 1 tablet (0.5 mg total) by mouth 2 (two) times daily as needed for anxiety. 40 tablet 0  . celecoxib (CELEBREX) 100 MG capsule Take 100 mg  by mouth every 12 (twelve) hours.    . cetirizine (ZYRTEC) 10 MG tablet Take 1 tablet (10 mg total) by mouth daily. 30 tablet 11  . cyclobenzaprine (FLEXERIL) 10 MG tablet Take 1/2-1 tab twice daily as needed for muscle spasm. 40 tablet 1  . doxycycline (VIBRA-TABS) 100 MG tablet doxycycline hyclate 100 mg tablet  TAKE 1 TABLET BY MOUTH TWICE A DAY    . esomeprazole (NEXIUM) 20 MG capsule Take 20 mg by mouth daily.     Marland Kitchen estradiol (ESTRACE) 1 MG tablet Take 1 mg by mouth daily.     . hydrocortisone 2.5 % cream hydrocortisone 2.5 % topical cream    . ibuprofen (ADVIL) 800 MG tablet Take 1 tablet (800 mg total) by mouth every 8 (eight) hours as needed. 40 tablet 1  . IMVEXXY MAINTENANCE PACK 10 MCG INST INSERT 1 VAGINAL INSERT(S) TWICE A WEEK BY VAGINAL ROUTE.    Marland Kitchen ketoconazole (NIZORAL) 2 % cream ketoconazole 2 % topical cream    . lidocaine (LIDODERM) 5 % Place 1 patch onto the skin daily. Remove & Discard patch within 12 hours or as directed by MD 30 patch 0  . losartan (COZAAR) 50 MG tablet Take 1 tablet (50 mg total) by mouth daily. 30 tablet 6  . magic mouthwash SOLN Take 5 mLs by mouth 4 (four) times daily as needed for mouth pain. 150 mL 0  . methadone (DOLOPHINE) 10 MG tablet Take 1 tablet (10 mg total) by mouth every 8 (eight) hours. 15 tablet 0  . Probiotic CAPS Take 1 capsule by mouth daily.    . promethazine (PHENERGAN) 25 MG tablet Take 25 mg by mouth daily as needed for nausea or vomiting.    . valACYclovir (VALTREX) 1000 MG tablet Take 1,000 mg by mouth 3 (three) times daily.    . Vitamin D, Ergocalciferol, (DRISDOL) 1.25 MG (50000 UNIT) CAPS capsule Take 50,000 Units by mouth once a week.     No current facility-administered medications for this visit.    Allergies:   Meperidine, Morphine, Penicillins, Lisinopril, Doxycycline, Gabapentin, Other, Amoxicillin, Codeine, Hydrocodone, Omeprazole, Oxycodone-acetaminophen, Sucralfate, and Tramadol    Social History:  The patient   reports that she has never smoked. She has never used smokeless tobacco. She reports that she does not drink alcohol and does not use drugs.   Family History:  The patient's family history includes Diabetes in her brother; Hypertension in her mother.    ROS:  Please see the history of present illness.   Otherwise, review of systems are positive for none.   All other systems are reviewed and negative.    PHYSICAL EXAM: VS:  BP 120/84   Pulse 65   Ht 5\' 5"  (1.651 m)   Wt 226 lb (102.5 kg)   SpO2 97%   BMI 37.61 kg/m  , BMI Body mass index is 37.61 kg/m. GENERAL:  Well  appearing HEENT: Pupils equal round and reactive, fundi not visualized, oral mucosa unremarkable NECK:  No jugular venous distention, waveform within normal limits, carotid upstroke brisk and symmetric, no bruits, + thyromegaly LUNGS:  Clear to auscultation bilaterally HEART:  RRR.  PMI not displaced or sustained,S1 and S2 within normal limits, no S3, no S4, no clicks, no rubs, no murmurs ABD:  Flat, positive bowel sounds normal in frequency in pitch, no bruits, no rebound, no guarding, no midline pulsatile mass, no hepatomegaly, no splenomegaly EXT:  2 plus radial pulses.  1+ DP/PT bilaterally.  No edema, no cyanosis no clubbing SKIN:  No rashes no nodules NEURO:  Cranial nerves II through XII grossly intact, motor grossly intact throughout PSYCH:  Cognitively intact, oriented to person place and time   EKG:  EKG is ordered today. 01/12/16: Sinus bradycardia.  Rate 56 bpm.  08/02/16: Sinus rhythm.  Rate 78 bpm.   05/08/17: Sinus rhythm.  Rate 76 bpm.  Poor R wave progression.  Low voltage limb leads. 02/24/2018: Sinus rhythm.  Rate 67 bpm. 02/25/19: Sinus bradycardia.  Rate 53 bpm.  Nonspecific T wave abnormalities. 09/29/19: Sinus rhythm.  Rate 66 bpm.  Low voltage.  Poor  R wave progression.    Echo 06/07/15: Study Conclusions  - Left ventricle: The cavity size was normal. There was mild focal basal hypertrophy of the  septum. Systolic function was vigorous. The estimated ejection fraction was in the range of 65% to 70%. Wall motion was normal; there were no regional wall motion abnormalities. Doppler parameters are consistent with abnormal left ventricular relaxation (grade 1 diastolic dysfunction). There was no evidence of elevated ventricular filling pressure by Doppler parameters. - Aortic valve: Trileaflet; normal thickness leaflets. There was no regurgitation. - Aortic root: The aortic root was normal in size. - Ascending aorta: The ascending aorta was normal in size. - Mitral valve: There was trivial regurgitation. - Left atrium: The atrium was normal in size. - Right ventricle: Systolic function was normal. - Tricuspid valve: There was trivial regurgitation. - Pulmonic valve: There was no regurgitation. - Pulmonary arteries: Systolic pressure was within the normal range. - Inferior vena cava: The vessel was normal in size. - Pericardium, extracardiac: There was no pericardial effusion.  Lexiscan Cardiolite 05/24/15:  Nuclear stress EF: 73%.  The left ventricular ejection fraction is hyperdynamic (>65%).  There was no ST segment deviation noted during stress.  The study is normal.  ABIs 01/30/17: normal  Recent Labs: 09/15/2019: TSH 1.45 09/22/2019: ALT 11 11/09/2019: BUN 17; Creatinine, Ser 0.74; Hemoglobin 13.2; Platelets 180.0; Potassium 4.0; Sodium 138    Lipid Panel    Component Value Date/Time   CHOL 193 09/22/2019 1017   TRIG 213 (H) 09/22/2019 1017   HDL 54 09/22/2019 1017   CHOLHDL 3.6 09/22/2019 1017   CHOLHDL 4 09/15/2019 0829   VLDL 63.6 (H) 09/15/2019 0829   LDLCALC 103 (H) 09/22/2019 1017   LDLDIRECT 97.0 09/15/2019 0829      Wt Readings from Last 3 Encounters:  04/10/20 226 lb (102.5 kg)  11/23/19 228 lb 9.6 oz (103.7 kg)  11/09/19 228 lb (103.4 kg)      ASSESSMENT AND PLAN:  # Hypertension:  Blood pressure is mildly elevated today.  It  was elevated when she saw her PCP but she was in a lot of pain at the time.  In general it has been controlled. Limit fast food and continue current losartan.  # Chest pain: Resolved.  Her chest pain was  associated with anxiety.  Stress testing 04/2015 was negative for ischemia.    # Thyroid nodules:  Benign nodules noted in April.  Repeat ultrasound recommended 09/2020.  Continue to monitor for stability until 09/2024.   # Claudication: # Numbness: ABIs normal 01/2017.  This is sciatica and back related.      Current medicines are reviewed at length with the patient today.  The patient does not have concerns regarding medicines.  The following changes have been made:  Labs/ tests ordered today include:   Orders Placed This Encounter  Procedures  . US THYROID      Disposition:   FU with Lavon Horn C. Duke Salvia, MD in 1 year   Signed, Chilton Si, MD  04/10/2020 11:25 AM    [facility]

## 2020-04-11 ENCOUNTER — Telehealth: Payer: Self-pay | Admitting: Internal Medicine

## 2020-04-11 DIAGNOSIS — E538 Deficiency of other specified B group vitamins: Secondary | ICD-10-CM

## 2020-04-11 DIAGNOSIS — E559 Vitamin D deficiency, unspecified: Secondary | ICD-10-CM

## 2020-04-11 DIAGNOSIS — T380X5D Adverse effect of glucocorticoids and synthetic analogues, subsequent encounter: Secondary | ICD-10-CM

## 2020-04-11 NOTE — Telephone Encounter (Signed)
    Patient requesting order for annual labs, prior to 12/8 physical

## 2020-04-12 NOTE — Telephone Encounter (Signed)
Dr. John has been notified.

## 2020-04-13 NOTE — Telephone Encounter (Signed)
Ok labs ordered for the [facility] lab  -   Please to let pt know to go there, no appt needed

## 2020-04-15 ENCOUNTER — Other Ambulatory Visit: Payer: Self-pay

## 2020-04-15 ENCOUNTER — Ambulatory Visit
Admission: RE | Admit: 2020-04-15 | Discharge: 2020-04-15 | Disposition: A | Discharge status: Home / Self Care | Payer: 59 | Source: Ambulatory Visit | Attending: Podiatry | Admitting: Podiatry

## 2020-04-15 DIAGNOSIS — T148XXA Other injury of unspecified body region, initial encounter: Secondary | ICD-10-CM

## 2020-05-01 ENCOUNTER — Other Ambulatory Visit: Payer: Self-pay

## 2020-05-01 ENCOUNTER — Ambulatory Visit: Payer: 59 | Admitting: Podiatry

## 2020-05-01 DIAGNOSIS — M779 Enthesopathy, unspecified: Secondary | ICD-10-CM

## 2020-05-01 DIAGNOSIS — M722 Plantar fascial fibromatosis: Secondary | ICD-10-CM | POA: Diagnosis not present

## 2020-05-01 LAB — COLOGUARD: COLOGUARD: NEGATIVE

## 2020-05-03 NOTE — Progress Notes (Signed)
Subjective: 59 year old female presents the office today for evaluation of left foot pain.  She said that she still having discomfort to the area.  She gets pain to the top of her foot/side of her foot as well.  She states she wears the boot but when she does a centimeters her hip hurts that she does not wear it regularly.  No recent injury or falls or changes otherwise. Denies any systemic complaints such as fevers, chills, nausea, vomiting. No acute changes since last appointment, and no other complaints at this time.   Objective: AAO x3, NAD DP/PT pulses palpable bilaterally, CRT less than 3 seconds There is minimal discomfort on the course, insertion of plantar fascia.  Moderate tenderness along fifth metatarsal base along the distal aspect of the peroneal tendon.  Tendon appears to be intact.  Mild diffuse tenderness of the midfoot but there is no specific area of pinpoint tenderness peer there is no edema, erythema.  MMT 5/5.  No pain with calf compression, swelling, warmth, erythema  Assessment: 59 year old female with plantar fasciitis, tendinitis left foot  Plan: -All treatment options discussed with the patient including all alternatives, risks, complications.  -I reviewed the MRI with her.  MRI did show mild to moderate appearing plantar fasciitis but negative for tendon tear or ligament tear.  No fracture.  Fatty atrophy of the intrinsic musculature of the foot likely due to diffuse. -At this point we will order physical therapy for her.  Prescription for [facility] was written. -Continue with supportive shoes, orthotics. -Patient encouraged to call the office with any questions, concerns, change in symptoms.   Vivi Barrack DPM

## 2020-05-11 ENCOUNTER — Telehealth: Payer: Self-pay | Admitting: Podiatry

## 2020-05-11 NOTE — Telephone Encounter (Signed)
Pt called about PT referral, she states no one has reached out to her.

## 2020-05-12 NOTE — Telephone Encounter (Signed)
Called and spoke with the patient and stated that I sent over the RX for the physical therapy and also I called and spoke with Leonette Most from [facility] to let him know that I sent over and to call the patient. Misty Stanley

## 2020-05-12 NOTE — Addendum Note (Signed)
Addended by: Hadley Pen R on: 05/12/2020 09:21 AM   Modules accepted: Orders

## 2020-05-12 NOTE — Telephone Encounter (Signed)
Can you please re-do the PT paperwork and let the patient know. Not sure what happened

## 2020-05-26 ENCOUNTER — Other Ambulatory Visit (INDEPENDENT_AMBULATORY_CARE_PROVIDER_SITE_OTHER): Payer: 59

## 2020-05-26 DIAGNOSIS — T380X5D Adverse effect of glucocorticoids and synthetic analogues, subsequent encounter: Secondary | ICD-10-CM | POA: Diagnosis not present

## 2020-05-26 DIAGNOSIS — E099 Drug or chemical induced diabetes mellitus without complications: Secondary | ICD-10-CM | POA: Diagnosis not present

## 2020-05-26 DIAGNOSIS — E538 Deficiency of other specified B group vitamins: Secondary | ICD-10-CM

## 2020-05-26 DIAGNOSIS — E559 Vitamin D deficiency, unspecified: Secondary | ICD-10-CM | POA: Diagnosis not present

## 2020-05-26 LAB — CBC WITH DIFFERENTIAL/PLATELET
Basophils Absolute: 0.1 10*3/uL (ref 0.0–0.1)
Basophils Relative: 1.2 % (ref 0.0–3.0)
Eosinophils Absolute: 0.2 10*3/uL (ref 0.0–0.7)
Eosinophils Relative: 3.5 % (ref 0.0–5.0)
HCT: 42.1 % (ref 36.0–46.0)
Hemoglobin: 14 g/dL (ref 12.0–15.0)
Lymphocytes Relative: 42.4 % (ref 12.0–46.0)
Lymphs Abs: 2.6 10*3/uL (ref 0.7–4.0)
MCHC: 33.3 g/dL (ref 30.0–36.0)
MCV: 87.2 fl (ref 78.0–100.0)
Monocytes Absolute: 0.4 10*3/uL (ref 0.1–1.0)
Monocytes Relative: 6.2 % (ref 3.0–12.0)
Neutro Abs: 2.9 10*3/uL (ref 1.4–7.7)
Neutrophils Relative %: 46.7 % (ref 43.0–77.0)
Platelets: 204 10*3/uL (ref 150.0–400.0)
RBC: 4.83 Mil/uL (ref 3.87–5.11)
RDW: 13.3 % (ref 11.5–15.5)
WBC: 6.1 10*3/uL (ref 4.0–10.5)

## 2020-05-26 LAB — URINALYSIS, ROUTINE W REFLEX MICROSCOPIC
Bilirubin Urine: NEGATIVE
Hgb urine dipstick: NEGATIVE
Ketones, ur: NEGATIVE
Leukocytes,Ua: NEGATIVE
Nitrite: NEGATIVE
RBC / HPF: NONE SEEN (ref 0–?)
Specific Gravity, Urine: 1.02 (ref 1.000–1.030)
Total Protein, Urine: NEGATIVE
Urine Glucose: NEGATIVE
Urobilinogen, UA: 0.2 (ref 0.0–1.0)
WBC, UA: NONE SEEN (ref 0–?)
pH: 6 (ref 5.0–8.0)

## 2020-05-26 LAB — VITAMIN B12: Vitamin B-12: 238 pg/mL (ref 211–911)

## 2020-05-26 LAB — LIPID PANEL
Cholesterol: 205 mg/dL — ABNORMAL HIGH (ref 0–200)
HDL: 53.6 mg/dL (ref >39.00)
NonHDL: 151.63
Total CHOL/HDL Ratio: 4
Triglycerides: 212 mg/dL — ABNORMAL HIGH (ref 0.0–149.0)
VLDL: 42.4 mg/dL — ABNORMAL HIGH (ref 0.0–40.0)

## 2020-05-26 LAB — BASIC METABOLIC PANEL
BUN: 14 mg/dL (ref 6–23)
CO2: 28 mEq/L (ref 19–32)
Calcium: 9.7 mg/dL (ref 8.4–10.5)
Chloride: 102 mEq/L (ref 96–112)
Creatinine, Ser: 0.76 mg/dL (ref 0.40–1.20)
GFR: 85.02 mL/min (ref >60.00)
Glucose, Bld: 116 mg/dL — ABNORMAL HIGH (ref 70–99)
Potassium: 3.9 mEq/L (ref 3.5–5.1)
Sodium: 139 mEq/L (ref 135–145)

## 2020-05-26 LAB — VITAMIN D 25 HYDROXY (VIT D DEFICIENCY, FRACTURES): VITD: 31.58 ng/mL (ref 30.00–100.00)

## 2020-05-26 LAB — LDL CHOLESTEROL, DIRECT: Direct LDL: 124 mg/dL

## 2020-05-26 LAB — MICROALBUMIN / CREATININE URINE RATIO
Creatinine,U: 70.3 mg/dL
Microalb Creat Ratio: 1 mg/g (ref 0.0–30.0)
Microalb, Ur: <0.7 mg/dL (ref 0.0–1.9)

## 2020-05-26 LAB — HEPATIC FUNCTION PANEL
ALT: 13 U/L (ref 0–35)
AST: 13 U/L (ref 0–37)
Albumin: 4.2 g/dL (ref 3.5–5.2)
Alkaline Phosphatase: 49 U/L (ref 39–117)
Bilirubin, Direct: 0.1 mg/dL (ref 0.0–0.3)
Total Bilirubin: 0.5 mg/dL (ref 0.2–1.2)
Total Protein: 7.3 g/dL (ref 6.0–8.3)

## 2020-05-26 LAB — TSH: TSH: 1.08 u[IU]/mL (ref 0.35–4.50)

## 2020-05-26 LAB — HEMOGLOBIN A1C: Hgb A1c MFr Bld: 6.3 % (ref 4.6–6.5)

## 2020-05-29 ENCOUNTER — Ambulatory Visit: Payer: 59 | Admitting: Podiatry

## 2020-05-31 ENCOUNTER — Other Ambulatory Visit: Payer: Self-pay

## 2020-05-31 ENCOUNTER — Encounter: Payer: Self-pay | Admitting: Internal Medicine

## 2020-05-31 ENCOUNTER — Ambulatory Visit (INDEPENDENT_AMBULATORY_CARE_PROVIDER_SITE_OTHER): Payer: 59 | Admitting: Internal Medicine

## 2020-05-31 VITALS — BP 150/100 | HR 86 | Temp 98.5°F | Ht 65.0 in | Wt 226.0 lb

## 2020-05-31 DIAGNOSIS — Z0000 Encounter for general adult medical examination without abnormal findings: Secondary | ICD-10-CM | POA: Diagnosis not present

## 2020-05-31 DIAGNOSIS — F411 Generalized anxiety disorder: Secondary | ICD-10-CM | POA: Diagnosis not present

## 2020-05-31 DIAGNOSIS — Z0001 Encounter for general adult medical examination with abnormal findings: Secondary | ICD-10-CM

## 2020-05-31 DIAGNOSIS — E559 Vitamin D deficiency, unspecified: Secondary | ICD-10-CM | POA: Diagnosis not present

## 2020-05-31 DIAGNOSIS — E099 Drug or chemical induced diabetes mellitus without complications: Secondary | ICD-10-CM | POA: Diagnosis not present

## 2020-05-31 DIAGNOSIS — T380X5S Adverse effect of glucocorticoids and synthetic analogues, sequela: Secondary | ICD-10-CM

## 2020-05-31 DIAGNOSIS — I10 Essential (primary) hypertension: Secondary | ICD-10-CM | POA: Diagnosis not present

## 2020-05-31 DIAGNOSIS — Z23 Encounter for immunization: Secondary | ICD-10-CM | POA: Diagnosis not present

## 2020-05-31 NOTE — Progress Notes (Addendum)
Subjective:    Patient ID: [name], female    DOB: Sep 20, 1960, 59 y.o.   MRN: [id number]  HPI  Here for wellness and f/u;  Overall doing ok;  Pt denies Chest pain, worsening SOB, DOE, wheezing, orthopnea, PND, worsening LE edema, palpitations, dizziness or syncope.  Pt denies neurological change such as new headache, facial or extremity weakness.  Pt denies polydipsia, polyuria, or low sugar symptoms. Pt states overall good compliance with treatment and medications, good tolerability, and has been trying to follow appropriate diet.  Pt denies worsening depressive symptoms, suicidal ideation or panic. No fever, night sweats, wt loss, loss of appetite, or other constitutional symptoms.  Pt states good ability with ADL's, has low fall risk, home safety reviewed and adequate, no other significant changes in hearing or vision, and only occasionally active with exercise. Denies worsening depressive symptoms, suicidal ideation, or panic Wt Readings from Last 3 Encounters:  05/31/20 226 lb (102.5 kg)  04/10/20 226 lb (102.5 kg)  11/23/19 228 lb 9.6 oz (103.7 kg)   BP Readings from Last 3 Encounters:  05/31/20 (!) 150/100  04/10/20 120/84  11/23/19 (!) 148/88  BP at home has been on the lower side, juststarted on losartan oct 2021 per cards, pt does not want more med if not needed.   Past Medical History:  Diagnosis Date  . Anemia   . ANEMIA-IRON DEFICIENCY 01/03/2007  . Anxiety   . ANXIETY 01/03/2007  . Complication of anesthesia    severe post-operative nausea except for bladder surgery in 2002 at [facility]-did not have complications then  . Depression   . DEPRESSION 01/03/2007  . DJD (degenerative joint disease)    knee  . Enlarged thyroid 10/18/2019  . Family history of adverse reaction to anesthesia    mother and father both had severe post-operative nausea and vomiting  . GERD 01/16/2009  . GERD (gastroesophageal reflux disease)   . Headache(784.0) 01/03/2007  . Hyperplastic colon  polyp    04/2010  . HYPERSOMNIA 04/04/2010  . Hypertension   . IBS 01/16/2009  . IBS (irritable bowel syndrome)   . Impaired glucose tolerance 05/18/2011  . Menopause    early  . Migraine   . Obesity   . 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 PONV (postoperative nausea and vomiting)   . Wheezing 07/02/2010   Past Surgical History:  Procedure Laterality Date  . ABDOMINAL HYSTERECTOMY    . APPENDECTOMY    . bladder tac    . CHOLECYSTECTOMY    . KNEE ARTHROSCOPY Bilateral   . OOPHORECTOMY      reports that she has never smoked. She has never used smokeless tobacco. She reports that she does not drink alcohol and does not use drugs. family history includes Diabetes in her brother; Hypertension in her mother. Allergies  Allergen Reactions  . Meperidine Shortness Of Breath  . Morphine Anaphylaxis  . Penicillins Hives, Nausea Only, Swelling and Other (See Comments)    PATIENT HAS HAD A PCN REACTION WITH IMMEDIATE RASH, FACIAL/TONGUE/THROAT SWELLING, SOB, OR LIGHTHEADEDNESS WITH HYPOTENSION:  #  #  YES  #  #  HAS PT DEVELOPED SEVERE RASH INVOLVING MUCUS MEMBRANES or SKIN NECROSIS: #  #  YES  #  #  PATIENT HAS HAD A PCN REACTION THAT REQUIRED HOSPITALIZATION:  #  #  YES  #  #  Has patient had a PCN reaction occurring within the last 10 years: No   . Lisinopril Cough       .  Doxycycline     nausea  . Gabapentin Other (See Comments)    Muscle pain, fatigue  . Other   . Amoxicillin Nausea Only  . Codeine Nausea Only and Other (See Comments)    Hot flashes also  . Hydrocodone Itching, Nausea Only and Rash  . Omeprazole Itching and Rash  . Oxycodone-Acetaminophen Nausea And Vomiting and Rash  . Sucralfate Rash and Other (See Comments)    Blurry vision   . Tramadol Itching, Nausea Only and Rash   Current Outpatient Medications on File Prior to Visit  Medication Sig Dispense Refill  . Abaloparatide (TYMLOS) 3120 MCG/1.56ML SOPN     . acetaminophen (TYLENOL) 500 MG tablet  Take 1,000 mg by mouth daily as needed for moderate pain or headache.    . Azelaic Acid 15 % cream azelaic acid 15 % topical gel    . esomeprazole (NEXIUM) 20 MG capsule Take 20 mg by mouth daily.     Marland Kitchen estradiol (ESTRACE) 1 MG tablet Take 1 mg by mouth daily.     . hydrocortisone 2.5 % cream hydrocortisone 2.5 % topical cream    . IMVEXXY MAINTENANCE PACK 10 MCG INST INSERT 1 VAGINAL INSERT(S) TWICE A WEEK BY VAGINAL ROUTE.    Marland Kitchen ketoconazole (NIZORAL) 2 % cream ketoconazole 2 % topical cream    . losartan (COZAAR) 50 MG tablet Take 1 tablet (50 mg total) by mouth daily. 30 tablet 6  . magic mouthwash SOLN Take 5 mLs by mouth 4 (four) times daily as needed for mouth pain. 150 mL 0  . Probiotic CAPS Take 1 capsule by mouth daily.    . promethazine (PHENERGAN) 25 MG tablet Take 25 mg by mouth daily as needed for nausea or vomiting.    . cetirizine (ZYRTEC) 10 MG tablet Take 1 tablet (10 mg total) by mouth daily. 30 tablet 11   No current facility-administered medications on file prior to visit.   Review of Systems All otherwise neg per pt     Objective:   Physical Exam BP (!) 150/100 (BP Location: Left Arm, Patient Position: Sitting, Cuff Size: Large)   Pulse 86   Temp 98.5 F (36.9 C) (Oral)   Ht 5\' 5"  (1.651 m)   Wt 226 lb (102.5 kg)   SpO2 98%   BMI 37.61 kg/m  VS noted,  Constitutional: Pt appears in NAD HENT: Head: NCAT.  Right Ear: External ear normal.  Left Ear: External ear normal.  Eyes: . Pupils are equal, round, and reactive to light. Conjunctivae and EOM are normal Nose: without d/c or deformity Neck: Neck supple. Gross normal ROM Cardiovascular: Normal rate and regular rhythm.   Pulmonary/Chest: Effort normal and breath sounds without rales or wheezing.  Abd:  Soft, NT, ND, + BS, no organomegaly Neurological: Pt is alert. At baseline orientation, motor grossly intact Skin: Skin is warm. No rashes, other new lesions, no LE edema Psychiatric: Pt behavior is normal  without agitation  All otherwise neg per pt Lab Results  Component Value Date   WBC 6.1 05/26/2020   HGB 14.0 05/26/2020   HCT 42.1 05/26/2020   PLT 204.0 05/26/2020   GLUCOSE 116 (H) 05/26/2020   CHOL 205 (H) 05/26/2020   TRIG 212.0 (H) 05/26/2020   HDL 53.60 05/26/2020   LDLDIRECT 124.0 05/26/2020   LDLCALC 103 (H) 09/22/2019   ALT 13 05/26/2020   AST 13 05/26/2020   NA 139 05/26/2020   K 3.9 05/26/2020   CL 102 05/26/2020  CREATININE 0.76 05/26/2020   BUN 14 05/26/2020   CO2 28 05/26/2020   TSH 1.08 05/26/2020   HGBA1C 6.3 05/26/2020   MICROALBUR <0.7 05/26/2020      Assessment & Plan:

## 2020-05-31 NOTE — Patient Instructions (Signed)
Please continue the Vitamin D as you are doing  Please check your BP at home once per day for 10 days, and let us know the results  You had the Shingles #1 shot, and the Pneumovax shots today  Please make a Nurse Visit to have the Shingles shot #2 done at 2-6 months.  The second Pneumovax shot would be when you turn 59 yo.  Please continue all other medications as before, and refills have been done if requested.  Please have the pharmacy call with any other refills you may need.  Please continue your efforts at being more active, low cholesterol diet, and weight control.  You are otherwise up to date with prevention measures today.  Please keep your appointments with your specialists as you may have planned  Please make an Appointment to return in 6 months, or sooner if needed

## 2020-06-03 ENCOUNTER — Encounter: Payer: Self-pay | Admitting: Internal Medicine

## 2020-06-03 NOTE — Assessment & Plan Note (Signed)
stable overall by history and exam, recent data reviewed with pt, and pt to continue medical treatment as before,  to f/u any worsening symptoms or concerns  

## 2020-06-03 NOTE — Assessment & Plan Note (Addendum)
Mild uncontrolled, declines change in tx for now, to check bp at home daily for 10 days and call with results  I spent 31 minutes in preparing to see the patient by review of recent labs, imaging and procedures, obtaining and reviewing separately obtained history, communicating with the patient and family or caregiver, ordering medications, tests or procedures, and documenting clinical information in the EHR including the differential Dx, treatment, and any further evaluation and other management of htn uncontrolled, dm, vit d def, anxiety

## 2020-06-03 NOTE — Assessment & Plan Note (Signed)

## 2020-06-03 NOTE — Assessment & Plan Note (Signed)
Lab Results  Component Value Date   HGBA1C 6.3 05/26/2020  stable overall by history and exam, recent data reviewed with pt, and pt to continue medical treatment as before,  to f/u any worsening symptoms or concerns

## 2020-06-03 NOTE — Assessment & Plan Note (Addendum)
Cont oral replacement at 6000 u qd

## 2020-06-07 ENCOUNTER — Encounter: Payer: Self-pay | Admitting: Podiatry

## 2020-06-07 ENCOUNTER — Ambulatory Visit (INDEPENDENT_AMBULATORY_CARE_PROVIDER_SITE_OTHER): Payer: 59 | Admitting: Podiatry

## 2020-06-07 ENCOUNTER — Other Ambulatory Visit: Payer: Self-pay

## 2020-06-07 DIAGNOSIS — M722 Plantar fascial fibromatosis: Secondary | ICD-10-CM | POA: Diagnosis not present

## 2020-06-07 DIAGNOSIS — M779 Enthesopathy, unspecified: Secondary | ICD-10-CM | POA: Diagnosis not present

## 2020-06-07 MED ORDER — TRIAMCINOLONE ACETONIDE 10 MG/ML IJ SUSP
10.0000 mg | Freq: Once | INTRAMUSCULAR | Status: AC
  Administered 2020-06-07: 10 mg

## 2020-06-08 NOTE — Progress Notes (Signed)
Subjective:   Patient ID: [name], female   DOB: 59 y.o.   MRN: [id number]   HPI Patient states that she has flared up significantly and has developed pain in her ankle and in her forefoot and states that her heel is improved with physical therapy but still giving her mild problem but she feels like she probably walking differently.  Patient states that the pain has become quite intense over the last few days   ROS      Objective:  Physical Exam  Neurovascular status intact with inflammatory capsulitis of the second MPJ left with fluid buildup and noted to have inflammation pain of the sinus tarsi left that is very painful when pressed.  Patient has plantar fasciitis that is improved but still present and she may be still walking with compensatory gait pattern     Assessment:  3 separate conditions with 1 being plantar fascial inflammation left and secondarily being inflammation of the sinus tarsi capsular joint and the second MPJ     Plan:  H&P reviewed all 3 conditions.  At this point I recommended focusing on the sinus tarsi forefoot and for the heel continue physical therapy stretching exercises topical medicine shoe gear modifications.  Sterile prep and injected the sinus tarsi 3 mg Kenalog 5 mg Xylocaine and for the forefoot I did a forefoot block 60 mg like Marcaine mixture sterile prep done and I did a sterile aspiration getting out a clear fluid injected quarter cc Kenalog and padding.  Advised on rigid bottom shoes and reappoint as symptoms indicate  X-rays indicate no indication currently of fracture or change in bony structure associated with condition

## 2020-06-14 ENCOUNTER — Telehealth (INDEPENDENT_AMBULATORY_CARE_PROVIDER_SITE_OTHER): Payer: 59 | Admitting: Family

## 2020-06-14 DIAGNOSIS — J209 Acute bronchitis, unspecified: Secondary | ICD-10-CM

## 2020-06-14 MED ORDER — AZITHROMYCIN 250 MG PO TABS: ORAL_TABLET | ORAL | 0 refills | Status: DC

## 2020-06-14 MED ORDER — BENZONATATE 200 MG PO CAPS: 200.0000 mg | ORAL_CAPSULE | Freq: Three times a day (TID) | ORAL | 0 refills | Status: DC | PRN

## 2020-06-14 NOTE — Progress Notes (Signed)
[name] is a 59 y.o. female with the following history as recorded in [employer]:  Patient Active Problem List   Diagnosis Date Noted  . Impaired vision in both eyes 11/09/2019  . Rash 11/05/2019  . Enlarged thyroid 10/18/2019  . Tenosynovitis of thumb 09/22/2019  . Angioedema 04/21/2019  . Blurred vision 05/20/2018  . Allergic rhinitis 05/20/2018  . Degenerative spondylolisthesis 03/03/2018  . Preop exam for internal medicine 02/18/2018  . Shingles 02/09/2018  . 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 Tinea cruris 01/05/2018  . Contact dermatitis 12/24/2017  . Surgical menopause 03/05/2017  . Vitamin D deficiency 03/05/2017  . Dizziness 06/22/2016  . Nausea without vomiting 06/22/2016  . Hyperglycemia 02/29/2016  . Hypertriglyceridemia 02/29/2016  . Dyspnea on exertion 04/15/2015  . Hypertension, uncontrolled 04/10/2015  . Delayed gastric emptying 12/16/2012  . Chronic pain 05/08/2012  . Muscle spasm of back 05/08/2012  . Chest pain 05/08/2012  . Gastroparesis 05/22/2011  . Encounter for well adult exam with abnormal findings 05/18/2011  . HYPERSOMNIA 04/04/2010  . GERD 01/16/2009  . IBS 01/16/2009  . 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 ANEMIA-IRON DEFICIENCY 01/03/2007  . Anxiety state 01/03/2007  . DEPRESSION 01/03/2007    Current Outpatient Medications  Medication Sig Dispense Refill  . Abaloparatide (TYMLOS) 3120 MCG/1.56ML SOPN     . acetaminophen (TYLENOL) 500 MG tablet Take 1,000 mg by mouth daily as needed for moderate pain or headache.    . Azelaic Acid 15 % cream azelaic acid 15 % topical gel    . azithromycin (ZITHROMAX) 250 MG tablet 2 tabs po qd x 1 day; 1 tablet per day x 4 days; 6 tablet 0  . benzonatate (TESSALON) 200 MG capsule Take 1 capsule (200 mg total) by mouth 3 (three) times daily as needed. 30 capsule 0  . cetirizine (ZYRTEC) 10 MG tablet Take 1 tablet (10 mg total) by mouth daily. 30 tablet 11  . esomeprazole (NEXIUM)  20 MG capsule Take 20 mg by mouth daily.     Marland Kitchen estradiol (ESTRACE) 1 MG tablet Take 1 mg by mouth daily.     . hydrocortisone 2.5 % cream hydrocortisone 2.5 % topical cream    . IMVEXXY MAINTENANCE PACK 10 MCG INST INSERT 1 VAGINAL INSERT(S) TWICE A WEEK BY VAGINAL ROUTE.    Marland Kitchen ketoconazole (NIZORAL) 2 % cream ketoconazole 2 % topical cream    . losartan (COZAAR) 50 MG tablet Take 1 tablet (50 mg total) by mouth daily. 30 tablet 6  . magic mouthwash SOLN Take 5 mLs by mouth 4 (four) times daily as needed for mouth pain. 150 mL 0  . Probiotic CAPS Take 1 capsule by mouth daily.    . promethazine (PHENERGAN) 25 MG tablet Take 25 mg by mouth daily as needed for nausea or vomiting.     No current facility-administered medications for this visit.    Allergies: Meperidine, Morphine, Penicillins, Lisinopril, Doxycycline, Gabapentin, Other, Amoxicillin, Codeine, Hydrocodone, Omeprazole, Oxycodone-acetaminophen, Sucralfate, and Tramadol  Past Medical History:  Diagnosis Date  . Anemia   . ANEMIA-IRON DEFICIENCY 01/03/2007  . Anxiety   . ANXIETY 01/03/2007  . Complication of anesthesia    severe post-operative nausea except for bladder surgery in 2002 at [facility]-did not have complications then  . Depression   . DEPRESSION 01/03/2007  . DJD (degenerative joint disease)    knee  . Enlarged thyroid 10/18/2019  . Family history of adverse reaction to anesthesia    mother and father both  had severe post-operative nausea and vomiting  . GERD 01/16/2009  . GERD (gastroesophageal reflux disease)   . Headache(784.0) 01/03/2007  . Hyperplastic colon polyp    04/2010  . HYPERSOMNIA 04/04/2010  . Hypertension   . IBS 01/16/2009  . IBS (irritable bowel syndrome)   . Impaired glucose tolerance 05/18/2011  . Menopause    early  . Migraine   . Obesity   . 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 PONV (postoperative nausea and vomiting)   . Wheezing 07/02/2010    Past Surgical History:  Procedure  Laterality Date  . ABDOMINAL HYSTERECTOMY    . APPENDECTOMY    . bladder tac    . CHOLECYSTECTOMY    . KNEE ARTHROSCOPY Bilateral   . OOPHORECTOMY      Family History  Problem Relation Age of Onset  . Hypertension Mother   . Diabetes Brother     Social History   Tobacco Use  . Smoking status: Never Smoker  . Smokeless tobacco: Never Used  Substance Use Topics  . Alcohol use: No    Alcohol/week: 0.0 standard drinks    Subjective:   I connected with [name] on 06/14/20 at  8:40 AM EST by a telephone call and verified that I am speaking with the correct person using two identifiers.   I discussed the limitations of evaluation and management by telemedicine and the availability of in person appointments. The patient expressed understanding and agreed to proceed. Provider in office/ patient is at home; provider and patient are only 2 people on telephone call.   Cough x 1 week; concerned for bronchitis; no fever, chest pain or shortness of breath; no concerns for COVID exposure; husband recently had bronchitis;    Objective:  There were no vitals filed for this visit.  Lungs: Respirations unlabored;  Neurologic: Alert and oriented; speech intact; face symmetrical;   Assessment:  1. Acute bronchitis, unspecified organism     Plan:  Rx for Z-pak and Tessalon Perles; increase fluids, rest; follow-up worse, no better and will consider COVID testing;  Time spent 10 minutes   No follow-ups on file.  No orders of the defined types were placed in this encounter.   Requested Prescriptions   Signed Prescriptions Disp Refills  . azithromycin (ZITHROMAX) 250 MG tablet 6 tablet 0    Sig: 2 tabs po qd x 1 day; 1 tablet per day x 4 days;  . benzonatate (TESSALON) 200 MG capsule 30 capsule 0    Sig: Take 1 capsule (200 mg total) by mouth 3 (three) times daily as needed.

## 2020-06-22 ENCOUNTER — Ambulatory Visit (INDEPENDENT_AMBULATORY_CARE_PROVIDER_SITE_OTHER): Payer: 59 | Admitting: Podiatry

## 2020-06-22 ENCOUNTER — Other Ambulatory Visit: Payer: Self-pay

## 2020-06-22 DIAGNOSIS — M779 Enthesopathy, unspecified: Secondary | ICD-10-CM | POA: Diagnosis not present

## 2020-06-22 MED ORDER — DEXAMETHASONE SODIUM PHOSPHATE 120 MG/30ML IJ SOLN
4.0000 mg | Freq: Once | INTRAMUSCULAR | Status: AC
  Administered 2020-06-22: 4 mg via INTRA_ARTICULAR

## 2020-06-26 NOTE — Progress Notes (Signed)
Subjective: 60 year old female presents the office today for follow-up evaluation of left foot pain.  She states the injection of the second MPJ that was performed by Dr. Charlsie Merles last appointment was helpful.  He is also doing better but she points the lateral aspect of the foot where she has majority discomfort and she points on the fifth metatarsal base.  She denies recent injury, any changes since I last saw her. Denies any systemic complaints such as fevers, chills, nausea, vomiting. No acute changes since last appointment, and no other complaints at this time.   Objective: AAO x3, NAD DP/PT pulses palpable bilaterally, CRT less than 3 seconds The majority tenderness is on the course of the peroneal tendon on the insertion to the fifth metatarsal base as well.  There is no edema, erythema.  No significant discomfort of second IPJ or the calcaneus today.  No pain on plantar flexion.  No pain Achilles tendon.  MMT 5/5.  No pain with calf compression, swelling, warmth, erythema  Assessment: Peroneal tendinitis  Plan: -All treatment options discussed with the patient including all alternatives, risks, complications.  -Today a steroid injection was performed on the fifth metatarsal base.  Verbal consent obtained.  Skin was cleaned alcohol and a mixture of 0.5 cc of dexamethasone phosphate, 0.5 cc of Marcaine plain was infiltrated into the area of maximal tenderness without complications.  Postinjection care discussed. -Recommend ankle brace for immobilization for the next couple of days. -Patient encouraged to call the office with any questions, concerns, change in symptoms.   Vivi Barrack DPM

## 2020-08-03 ENCOUNTER — Ambulatory Visit: Payer: 59 | Admitting: Podiatry

## 2020-08-03 ENCOUNTER — Encounter: Payer: Self-pay | Admitting: Gastroenterology

## 2020-08-10 ENCOUNTER — Ambulatory Visit (INDEPENDENT_AMBULATORY_CARE_PROVIDER_SITE_OTHER): Payer: 59

## 2020-08-10 ENCOUNTER — Other Ambulatory Visit: Payer: Self-pay

## 2020-08-10 DIAGNOSIS — Z23 Encounter for immunization: Secondary | ICD-10-CM

## 2020-08-16 ENCOUNTER — Other Ambulatory Visit: Payer: Self-pay | Admitting: Cardiovascular Disease

## 2020-08-17 ENCOUNTER — Other Ambulatory Visit: Payer: Self-pay

## 2020-08-17 ENCOUNTER — Encounter: Payer: Self-pay | Admitting: Internal Medicine

## 2020-08-17 ENCOUNTER — Ambulatory Visit: Payer: 59 | Admitting: Internal Medicine

## 2020-08-17 DIAGNOSIS — J069 Acute upper respiratory infection, unspecified: Secondary | ICD-10-CM | POA: Diagnosis not present

## 2020-08-17 DIAGNOSIS — Z90710 Acquired absence of both cervix and uterus: Secondary | ICD-10-CM | POA: Insufficient documentation

## 2020-08-17 DIAGNOSIS — I10 Essential (primary) hypertension: Secondary | ICD-10-CM

## 2020-08-17 DIAGNOSIS — R739 Hyperglycemia, unspecified: Secondary | ICD-10-CM

## 2020-08-17 DIAGNOSIS — B370 Candidal stomatitis: Secondary | ICD-10-CM | POA: Diagnosis not present

## 2020-08-17 DIAGNOSIS — Z87898 Personal history of other specified conditions: Secondary | ICD-10-CM | POA: Insufficient documentation

## 2020-08-17 DIAGNOSIS — E669 Obesity, unspecified: Secondary | ICD-10-CM | POA: Insufficient documentation

## 2020-08-17 MED ORDER — GABAPENTIN 100 MG PO CAPS: 200.0000 mg | ORAL_CAPSULE | Freq: Three times a day (TID) | ORAL | 5 refills | Status: AC | PRN

## 2020-08-17 MED ORDER — VALACYCLOVIR HCL 1 G PO TABS: 1000.0000 mg | ORAL_TABLET | Freq: Two times a day (BID) | ORAL | 0 refills | Status: DC

## 2020-08-17 MED ORDER — NYSTATIN 100000 UNIT/ML MT SUSP: 500000.0000 [IU] | Freq: Four times a day (QID) | OROMUCOSAL | 1 refills | Status: AC

## 2020-08-17 NOTE — Patient Instructions (Signed)
Ok to restart the gabapentin at 200 mg three times per day  Please take all new medication as prescribed - the valtrex for presumed shingles type illness  Please take all new medication as prescribed - the nystatin soln for probable thrush  Please continue all other medications as before, and refills have been done if requested.  Please have the pharmacy call with any other refills you may need.  Please keep your appointments with your specialists as you may have planned

## 2020-08-17 NOTE — Progress Notes (Signed)
Patient ID: [name], female   DOB: 03/18/1961, 60 y.o.   MRN: [id number]        Chief Complaint: left HA and submandbular swelling        HPI:  [name] is a 60 y.o. female here with c/o atypical URI symptoms of left HA neuritic like burning sensitive including left hemicranail and left facial associated with tender left submandibular swelling and lumpiness in a vague area, without fever chills, ear pain, sinus congestion, ST or cough.  Felt like nerve related  But gaba 100 no help last night.  Also follow for chronic lbp and peirpheral neuropathy followed per Dr patel.  S/p shingrix x 2.  No rash.  Saw optho 3 days ago - eye exam benign per optho per pt.  Pt States she is not actually allergic to gabapentin, only has s/e with higher dosing   Also has thrush to tongue for some reason with white coating, asks for tx.  Pt denies chest pain, increased sob or doe, wheezing, orthopnea, PND, increased LE swelling, palpitations, dizziness or syncope.   Pt denies polydipsia, polyuria, denies other new focal neuro s/s.   Pt denies fever, wt loss, night sweats, loss of appetite, or other constitutional symptoms   Wt Readings from Last 3 Encounters:  08/17/20 223 lb (101.2 kg)  05/31/20 226 lb (102.5 kg)  04/10/20 226 lb (102.5 kg)   BP Readings from Last 3 Encounters:  08/17/20 (!) 148/86  05/31/20 (!) 150/100  04/10/20 120/84         Past Medical History:  Diagnosis Date  . Anemia   . ANEMIA-IRON DEFICIENCY 01/03/2007  . Anxiety   . ANXIETY 01/03/2007  . Complication of anesthesia    severe post-operative nausea except for bladder surgery in 2002 at [facility]-did not have complications then  . Depression   . DEPRESSION 01/03/2007  . DJD (degenerative joint disease)    knee  . Enlarged thyroid 10/18/2019  . Family history of adverse reaction to anesthesia    mother and father both had severe post-operative nausea and vomiting  . GERD 01/16/2009  . GERD (gastroesophageal reflux  disease)   . Headache(784.0) 01/03/2007  . Hyperplastic colon polyp    04/2010  . HYPERSOMNIA 04/04/2010  . Hypertension   . IBS 01/16/2009  . IBS (irritable bowel syndrome)   . Impaired glucose tolerance 05/18/2011  . Menopause    early  . Migraine   . Obesity   . 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 PONV (postoperative nausea and vomiting)   . Wheezing 07/02/2010   Past Surgical History:  Procedure Laterality Date  . ABDOMINAL HYSTERECTOMY    . APPENDECTOMY    . bladder tac    . CHOLECYSTECTOMY    . KNEE ARTHROSCOPY Bilateral   . OOPHORECTOMY      reports that she has never smoked. She has never used smokeless tobacco. She reports that she does not drink alcohol and does not use drugs. family history includes Diabetes in her brother; Hypertension in her mother. Allergies  Allergen Reactions  . Meperidine Shortness Of Breath  . Morphine Anaphylaxis  . Penicillins Hives, Nausea Only, Swelling, Other (See Comments) and Anaphylaxis    PATIENT HAS HAD A PCN REACTION WITH IMMEDIATE RASH, FACIAL/TONGUE/THROAT SWELLING, SOB, OR LIGHTHEADEDNESS WITH HYPOTENSION:  #  #  YES  #  #  HAS PT DEVELOPED SEVERE RASH INVOLVING MUCUS MEMBRANES or SKIN NECROSIS: #  #  YES  #  #  PATIENT HAS HAD A PCN REACTION THAT REQUIRED HOSPITALIZATION:  #  #  YES  #  #  Has patient had a PCN reaction occurring within the last 10 years: No   . Lisinopril Cough       . Doxycycline Nausea Only    nausea  . Gabapentin Other (See Comments)    Muscle pain, fatigue  . Other   . Amoxicillin Nausea Only  . Azithromycin Nausea And Vomiting and Nausea Only  . Codeine Nausea Only and Other (See Comments)    Hot flashes also  . Hydrocodone Itching, Nausea Only and Rash  . Omeprazole Itching and Rash  . Oxycodone Nausea Only, Rash and Nausea And Vomiting  . Oxycodone-Acetaminophen Nausea And Vomiting and Rash  . Sucralfate Rash and Other (See Comments)    Blurry vision   . Tramadol Itching, Nausea  Only and Rash   Current Outpatient Medications on File Prior to Visit  Medication Sig Dispense Refill  . Abaloparatide (TYMLOS) 3120 MCG/1.56ML SOPN     . acetaminophen (TYLENOL) 500 MG tablet Take 1,000 mg by mouth daily as needed for moderate pain or headache.    . Azelaic Acid 15 % cream azelaic acid 15 % topical gel    . azithromycin (ZITHROMAX) 250 MG tablet 2 tabs po qd x 1 day; 1 tablet per day x 4 days; 6 tablet 0  . benzonatate (TESSALON) 200 MG capsule Take 1 capsule (200 mg total) by mouth 3 (three) times daily as needed. 30 capsule 0  . esomeprazole (NEXIUM) 20 MG capsule Take 20 mg by mouth daily.     Marland Kitchen estradiol (ESTRACE) 1 MG tablet Take 1 mg by mouth daily.     . hydrocortisone 2.5 % cream hydrocortisone 2.5 % topical cream    . IMVEXXY MAINTENANCE PACK 10 MCG INST INSERT 1 VAGINAL INSERT(S) TWICE A WEEK BY VAGINAL ROUTE.    Marland Kitchen ketoconazole (NIZORAL) 2 % cream ketoconazole 2 % topical cream    . losartan (COZAAR) 50 MG tablet TAKE 1 TABLET BY MOUTH EVERY DAY 30 tablet 8  . magic mouthwash SOLN Take 5 mLs by mouth 4 (four) times daily as needed for mouth pain. 150 mL 0  . Probiotic CAPS Take 1 capsule by mouth daily.    . promethazine (PHENERGAN) 25 MG tablet Take 25 mg by mouth daily as needed for nausea or vomiting.    . cetirizine (ZYRTEC) 10 MG tablet Take 1 tablet (10 mg total) by mouth daily. 30 tablet 11  . estradiol (ESTRACE) 0.5 MG tablet Take 0.5 mg by mouth daily.     No current facility-administered medications on file prior to visit.        ROS:  All others reviewed and negative.  Objective        PE:  BP (!) 148/86   Pulse 79   Temp 98.5 F (36.9 C) (Oral)   Ht 5\' 5"  (1.651 m)   Wt 223 lb (101.2 kg)   SpO2 97%   BMI 37.11 kg/m                 Constitutional: Pt appears in NAD               HENT: Head: NCAT.                Right Ear: External ear normal.                 Left Ear: External ear normal.  Eyes: . Pupils are equal, round,  and reactive to light. Conjunctivae and EOM are normal; no rash to left hemicranium or face but has diffuse mild sensitive to lt and vague left submandibular swelling shoddy LA tender; dorsal tongue has whitish coating               Nose: without d/c or deformity               Neck: Neck supple. Gross normal ROM               Cardiovascular: Normal rate and regular rhythm.                 Pulmonary/Chest: Effort normal and breath sounds without rales or wheezing.                Abd:  Soft, NT, ND, + BS, no organomegaly               Neurological: Pt is alert. At baseline orientation, motor grossly intact               Skin: Skin is warm. No rashes, no other new lesions, LE edema - none               Psychiatric: Pt behavior is normal without agitation   Micro: none  Cardiac tracings I have personally interpreted today:  none  Pertinent Radiological findings (summarize): none   Lab Results  Component Value Date   WBC 6.1 05/26/2020   HGB 14.0 05/26/2020   HCT 42.1 05/26/2020   PLT 204.0 05/26/2020   GLUCOSE 116 (H) 05/26/2020   CHOL 205 (H) 05/26/2020   TRIG 212.0 (H) 05/26/2020   HDL 53.60 05/26/2020   LDLDIRECT 124.0 05/26/2020   LDLCALC 103 (H) 09/22/2019   ALT 13 05/26/2020   AST 13 05/26/2020   NA 139 05/26/2020   K 3.9 05/26/2020   CL 102 05/26/2020   CREATININE 0.76 05/26/2020   BUN 14 05/26/2020   CO2 28 05/26/2020   TSH 1.08 05/26/2020   HGBA1C 6.3 05/26/2020   MICROALBUR <0.7 05/26/2020   Assessment/Plan:  ARDYN FORGE is a 60 y.o. White or Caucasian [1] female with  has a past medical history of Anemia, ANEMIA-IRON DEFICIENCY (01/03/2007), Anxiety, ANXIETY (01/03/2007), Complication of anesthesia, Depression, DEPRESSION (01/03/2007), DJD (degenerative joint disease), Enlarged thyroid (10/18/2019), Family history of adverse reaction to anesthesia, GERD (01/16/2009), GERD (gastroesophageal reflux disease), Headache(784.0) (01/03/2007), Hyperplastic colon polyp,  HYPERSOMNIA (04/04/2010), Hypertension, IBS (01/16/2009), IBS (irritable bowel syndrome), Impaired glucose tolerance (05/18/2011), Menopause, Migraine, Obesity, OSTEOARTHRITIS, KNEES, 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  URI (upper respiratory infection) Atypicall, suspect viral though no rash, for valtrex course, gabapentin 200 tid prn,  to f/u any worsening symptoms or concerns   Hyperglycemia Lab Results  Component Value Date   HGBA1C 6.3 05/26/2020   Stable, pt to continue current medical treatment - diet   Hypertension, uncontrolled BP Readings from Last 3 Encounters:  08/17/20 (!) 148/86  05/31/20 (!) 150/100  04/10/20 120/84   Stable, pt to continue medical treatment  - losartan 50   Thrush For nystatin soln asd, . to f/u any worsening symptoms or concerns  Followup: Return if symptoms worsen or fail to improve.  Oliver Barre, MD 08/20/2020 7:43 PM [facility]

## 2020-08-20 ENCOUNTER — Encounter: Payer: Self-pay | Admitting: Internal Medicine

## 2020-08-20 DIAGNOSIS — B370 Candidal stomatitis: Secondary | ICD-10-CM | POA: Insufficient documentation

## 2020-08-20 DIAGNOSIS — J069 Acute upper respiratory infection, unspecified: Secondary | ICD-10-CM | POA: Insufficient documentation

## 2020-08-20 NOTE — Assessment & Plan Note (Signed)
Atypicall, suspect viral though no rash, for valtrex course, gabapentin 200 tid prn,  to f/u any worsening symptoms or concerns

## 2020-08-20 NOTE — Assessment & Plan Note (Signed)
For nystatin soln asd,  to f/u any worsening symptoms or concerns 

## 2020-08-20 NOTE — Assessment & Plan Note (Signed)
BP Readings from Last 3 Encounters:  08/17/20 (!) 148/86  05/31/20 (!) 150/100  04/10/20 120/84   Stable, pt to continue medical treatment  - losartan 50

## 2020-08-20 NOTE — Assessment & Plan Note (Signed)
Lab Results  Component Value Date   HGBA1C 6.3 05/26/2020   Stable, pt to continue current medical treatment - diet

## 2020-08-23 ENCOUNTER — Ambulatory Visit (INDEPENDENT_AMBULATORY_CARE_PROVIDER_SITE_OTHER): Payer: 59

## 2020-08-23 ENCOUNTER — Ambulatory Visit: Payer: 59 | Admitting: Podiatry

## 2020-08-23 ENCOUNTER — Other Ambulatory Visit: Payer: Self-pay

## 2020-08-23 DIAGNOSIS — M79672 Pain in left foot: Secondary | ICD-10-CM

## 2020-08-23 DIAGNOSIS — M6740 Ganglion, unspecified site: Secondary | ICD-10-CM | POA: Diagnosis not present

## 2020-08-23 NOTE — Progress Notes (Signed)
Subjective:   Patient ID: [name], female   DOB: 60 y.o.   MRN: [id number]   HPI Patient presents with a nodule on the lateral side of the left foot which measures about 1.5 x 1.5 cm and is freely movable and states that it is mildly tender it is been present for several months    ROS      Objective:  Physical Exam  Neurovascular status intact with patient found to have a mass lateral side left foot which appears to be within subcutaneous tissue freely movable measuring 1.5 x 1.5 cm     Assessment:  Probability for ganglionic cyst left     Plan:  H&P reviewed condition.  I went ahead did a sterile block of the area 61 g like Marcaine mixture did sterile prep I aspirated this getting out a gelatinous fluid and I was able to completely flatten it and then applied sterile dressing.  Gave instructions on soaks reappoint to recheck  X-rays were negative for size of there is calcification associated with this

## 2020-09-06 ENCOUNTER — Other Ambulatory Visit: Payer: Self-pay

## 2020-09-06 ENCOUNTER — Ambulatory Visit: Payer: 59 | Admitting: Podiatry

## 2020-09-06 DIAGNOSIS — M6740 Ganglion, unspecified site: Secondary | ICD-10-CM | POA: Diagnosis not present

## 2020-09-06 NOTE — Progress Notes (Signed)
Subjective:   Patient ID: [name], female   DOB: 60 y.o.   MRN: [id number]   HPI Patient states that this refilled just within a couple weeks of the initial treatment and it really bothers me and I think I need it removed   ROS      Objective:  Physical Exam  Ocular status intact with ganglionic cyst apparent left lateral foot that does have a probable consistency to it     Assessment:  Strong probability for ganglionic cyst left that did show gelatinous fluid at last visit but unfortunately refilled     Plan:  H&P reviewed condition and recommended excision.  I explained procedure risk patient wants surgery and I Apsley explained there is no long-term guarantees that this will get better and that it is possible it will recur.  She is willing to accept risk and I allowed her to sign consent form after review when she is scheduled for outpatient surgery.  Encouraged her to call with any questions which may come up prior to the procedure

## 2020-09-11 ENCOUNTER — Telehealth: Payer: Self-pay | Admitting: Urology

## 2020-09-11 NOTE — Telephone Encounter (Signed)
DOS: 09/19/20  EXC GANGLION/ TUMOR LEFT --- 28090  [facility] EFFECTIVE DATE - 06/24/20  PLAN DEDUCTIBLE -  $500.00 W/ $477.15 REMAINING OUT OF POCKET - $2,500.00 W/  $2,046.46 REMAINING  COPAY - $0.00 COINSURANCE - 20%   PER [employer] WEB SITE CPT CODE 72620 Notification or Prior Authorization is not required for the requested services  Decision ID #:[id number]

## 2020-09-13 ENCOUNTER — Telehealth: Payer: Self-pay | Admitting: Podiatry

## 2020-09-13 NOTE — Telephone Encounter (Signed)
Patient wants to come in to have fluid removed from her 2nd toe, left foot prior to surgery 3/29 for cyst removal. Patient states Regal has removed fluid before. Please advise if patient should come in to have fluid removed before surgery.

## 2020-09-13 NOTE — Telephone Encounter (Signed)
I think it is OK to leave the fluid in there until surgery next week with Dr. Charlsie Merles. If it causes any issues before that, let us know.

## 2020-09-14 DIAGNOSIS — M5481 Occipital neuralgia: Secondary | ICD-10-CM | POA: Insufficient documentation

## 2020-09-14 DIAGNOSIS — M533 Sacrococcygeal disorders, not elsewhere classified: Secondary | ICD-10-CM | POA: Insufficient documentation

## 2020-09-14 DIAGNOSIS — M961 Postlaminectomy syndrome, not elsewhere classified: Secondary | ICD-10-CM | POA: Insufficient documentation

## 2020-09-19 ENCOUNTER — Encounter: Payer: Self-pay | Admitting: Podiatry

## 2020-09-19 DIAGNOSIS — M7752 Other enthesopathy of left foot: Secondary | ICD-10-CM | POA: Diagnosis not present

## 2020-09-19 DIAGNOSIS — M67472 Ganglion, left ankle and foot: Secondary | ICD-10-CM

## 2020-09-26 ENCOUNTER — Encounter: Payer: Self-pay | Admitting: Podiatry

## 2020-09-26 ENCOUNTER — Ambulatory Visit (INDEPENDENT_AMBULATORY_CARE_PROVIDER_SITE_OTHER): Payer: 59 | Admitting: Podiatry

## 2020-09-26 ENCOUNTER — Other Ambulatory Visit: Payer: Self-pay

## 2020-09-26 DIAGNOSIS — Z9889 Other specified postprocedural states: Secondary | ICD-10-CM

## 2020-09-26 DIAGNOSIS — M6740 Ganglion, unspecified site: Secondary | ICD-10-CM

## 2020-09-26 NOTE — Progress Notes (Signed)
  Subjective:  Patient ID: [name], female    DOB: Dec 28, 1960,  MRN: [id number]  Chief Complaint  Patient presents with  . Post-op Problem  . Routine Post Op    PT stated that she is doing okay she does have some pain     DOS: 09/19/2020 Procedure: Excision of ganglion with Dr. Charlsie Merles  60 y.o. female returns for post-op check.  She is doing well  Review of Systems: Negative except as noted in the HPI. Denies N/V/F/Ch.   Objective:  There were no vitals filed for this visit. There is no height or weight on file to calculate BMI. Constitutional Well developed. Well nourished.  Vascular Foot warm and well perfused. Capillary refill normal to all digits.   Neurologic Normal speech. Oriented to person, place, and time. Epicritic sensation to light touch grossly present bilaterally.  Dermatologic Skin healing well without signs of infection. Skin edges well coapted without signs of infection.  Orthopedic: Tenderness to palpation noted about the surgical site.    Assessment:   1. Ganglion   2. Post-operative state    Plan:  Patient was evaluated and treated and all questions answered.  S/p foot surgery left -Progressing as expected post-operatively. -WB Status: WBAT in surgical shoe -Sutures: will remove next week. -Medications: No refills required -Foot redressed.  Return in about 1 week (around 10/03/2020) for suture removal .

## 2020-09-27 ENCOUNTER — Encounter: Payer: 59 | Admitting: Podiatry

## 2020-09-28 ENCOUNTER — Encounter: Payer: Self-pay | Admitting: Podiatry

## 2020-10-03 ENCOUNTER — Ambulatory Visit (INDEPENDENT_AMBULATORY_CARE_PROVIDER_SITE_OTHER): Payer: 59 | Admitting: Podiatry

## 2020-10-03 ENCOUNTER — Other Ambulatory Visit: Payer: Self-pay

## 2020-10-03 DIAGNOSIS — Z9889 Other specified postprocedural states: Secondary | ICD-10-CM

## 2020-10-03 DIAGNOSIS — M6740 Ganglion, unspecified site: Secondary | ICD-10-CM

## 2020-10-04 ENCOUNTER — Encounter: Payer: Self-pay | Admitting: Podiatry

## 2020-10-04 NOTE — Progress Notes (Signed)
  Subjective:  Patient ID: [name], female    DOB: 1961-05-23,  MRN: [id number]  Chief Complaint  Patient presents with  . Routine Post Op     DOS 09/19/2020 REMOVAL GANGLION CYST LT,suture removal    DOS: 09/19/2020 Procedure: Excision of ganglion with Dr. Charlsie Merles  60 y.o. female returns for post-op check.  She is doing well.  She is still having pain in the second toe  Review of Systems: Negative except as noted in the HPI. Denies N/V/F/Ch.   Objective:  There were no vitals filed for this visit. There is no height or weight on file to calculate BMI. Constitutional Well developed. Well nourished.  Vascular Foot warm and well perfused. Capillary refill normal to all digits.   Neurologic Normal speech. Oriented to person, place, and time. Epicritic sensation to light touch grossly present bilaterally.  Dermatologic  incision is well-healed  Orthopedic: Tenderness to palpation noted about the surgical site.    Assessment:   1. Ganglion   2. Post-operative state    Plan:  Patient was evaluated and treated and all questions answered.  S/p foot surgery left -Progressing as expected post-operatively. -WB Status: WBAT in regular shoes -Sutures: All removed today should begin regular bathing  She will discussed the second toe pain with Dr. Charlsie Merles next week   Return in about 2 weeks (around 10/17/2020) for with Dr Charlsie Merles.

## 2020-10-05 ENCOUNTER — Other Ambulatory Visit: Payer: 59

## 2020-10-11 ENCOUNTER — Other Ambulatory Visit: Payer: Self-pay

## 2020-10-11 ENCOUNTER — Ambulatory Visit: Payer: 59 | Admitting: Internal Medicine

## 2020-10-11 ENCOUNTER — Encounter: Payer: Self-pay | Admitting: Internal Medicine

## 2020-10-11 VITALS — BP 126/72 | HR 102 | Ht 65.0 in | Wt 232.0 lb

## 2020-10-11 DIAGNOSIS — J069 Acute upper respiratory infection, unspecified: Secondary | ICD-10-CM

## 2020-10-11 DIAGNOSIS — J309 Allergic rhinitis, unspecified: Secondary | ICD-10-CM | POA: Diagnosis not present

## 2020-10-11 DIAGNOSIS — J302 Other seasonal allergic rhinitis: Secondary | ICD-10-CM

## 2020-10-11 DIAGNOSIS — J029 Acute pharyngitis, unspecified: Secondary | ICD-10-CM

## 2020-10-11 DIAGNOSIS — R739 Hyperglycemia, unspecified: Secondary | ICD-10-CM | POA: Diagnosis not present

## 2020-10-11 DIAGNOSIS — R5383 Other fatigue: Secondary | ICD-10-CM | POA: Insufficient documentation

## 2020-10-11 DIAGNOSIS — E559 Vitamin D deficiency, unspecified: Secondary | ICD-10-CM

## 2020-10-11 DIAGNOSIS — E781 Pure hyperglyceridemia: Secondary | ICD-10-CM

## 2020-10-11 LAB — POCT RAPID STREP A (OFFICE): Rapid Strep A Screen: NEGATIVE

## 2020-10-11 MED ORDER — PREDNISONE 10 MG PO TABS: ORAL_TABLET | ORAL | 0 refills | Status: DC

## 2020-10-11 MED ORDER — METHYLPREDNISOLONE ACETATE 80 MG/ML IJ SUSP
80.0000 mg | Freq: Once | INTRAMUSCULAR | Status: AC
  Administered 2020-10-11: 80 mg via INTRAMUSCULAR

## 2020-10-11 MED ORDER — LEVOFLOXACIN 500 MG PO TABS: 500.0000 mg | ORAL_TABLET | Freq: Every day | ORAL | 0 refills | Status: AC

## 2020-10-11 NOTE — Progress Notes (Signed)
Patient ID: [name], female   DOB: 11/05/1960, 60 y.o.   MRN: [id number]        Chief Complaint: follow up HTN, HLD and hyperglycemia, low vit d, sinusitis, allergies, fatigue       HPI:  [name] is a 60 y.o. female Here with 2-3 days acute onset fever, facial pain, pressure, headache, general weakness and malaise, and greenish d/c, with mild ST and cough, but pt denies chest pain, wheezing, increased sob or doe, orthopnea, PND, increased LE swelling, palpitations, dizziness or syncope.  Does have several wks ongoing nasal allergy symptoms with clearish congestion, itch and sneezing, without fever, pain, ST, cough, swelling or wheezing.Does c/o ongoing fatigue, but denies signficant daytime hypersomnolence.  Has been taking vit d.  Trying to follow lower chol diet       Wt Readings from Last 3 Encounters:  10/11/20 232 lb (105.2 kg)  08/17/20 223 lb (101.2 kg)  05/31/20 226 lb (102.5 kg)   BP Readings from Last 3 Encounters:  10/11/20 126/72  08/17/20 (!) 148/86  05/31/20 (!) 150/100         Past Medical History:  Diagnosis Date  . Anemia   . ANEMIA-IRON DEFICIENCY 01/03/2007  . Anxiety   . ANXIETY 01/03/2007  . Complication of anesthesia    severe post-operative nausea except for bladder surgery in 2002 at [facility]-did not have complications then  . Depression   . DEPRESSION 01/03/2007  . DJD (degenerative joint disease)    knee  . Enlarged thyroid 10/18/2019  . Family history of adverse reaction to anesthesia    mother and father both had severe post-operative nausea and vomiting  . GERD 01/16/2009  . GERD (gastroesophageal reflux disease)   . Headache(784.0) 01/03/2007  . Hyperplastic colon polyp    04/2010  . HYPERSOMNIA 04/04/2010  . Hypertension   . IBS 01/16/2009  . IBS (irritable bowel syndrome)   . Impaired glucose tolerance 05/18/2011  . Menopause    early  . Migraine   . Obesity   . 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 PONV  (postoperative nausea and vomiting)   . Wheezing 07/02/2010   Past Surgical History:  Procedure Laterality Date  . ABDOMINAL HYSTERECTOMY    . APPENDECTOMY    . bladder tac    . CHOLECYSTECTOMY    . KNEE ARTHROSCOPY Bilateral   . OOPHORECTOMY      reports that she has never smoked. She has never used smokeless tobacco. She reports that she does not drink alcohol and does not use drugs. family history includes Diabetes in her brother; Hypertension in her mother. Allergies  Allergen Reactions  . Meperidine Shortness Of Breath  . Morphine Anaphylaxis  . Penicillins Hives, Nausea Only, Swelling, Other (See Comments), Anaphylaxis and Nausea And Vomiting    PATIENT HAS HAD A PCN REACTION WITH IMMEDIATE RASH, FACIAL/TONGUE/THROAT SWELLING, SOB, OR LIGHTHEADEDNESS WITH HYPOTENSION:  #  #  YES  #  #  HAS PT DEVELOPED SEVERE RASH INVOLVING MUCUS MEMBRANES or SKIN NECROSIS: #  #  YES  #  #  PATIENT HAS HAD A PCN REACTION THAT REQUIRED HOSPITALIZATION:  #  #  YES  #  #  Has patient had a PCN reaction occurring within the last 10 years: No  vomiting PATIENT HAS HAD A PCN REACTION WITH IMMEDIATE RASH, FACIAL/TONGUE/THROAT SWELLING, SOB, OR LIGHTHEADEDNESS WITH HYPOTENSION: # # YES # #  HAS PT DEVELOPED SEVERE RASH INVOLVING MUCUS MEMBRANES or SKIN NECROSIS: # #  YES # #  PATIENT HAS HAD A PCN REACTION THAT REQUIRED HOSPITALIZATION: # # YES # #  Has patient had a PCN reaction occurring within the last 10 years: No  . Azithromycin Nausea And Vomiting and Nausea Only  . Lisinopril Cough       . Sucralfate Rash and Other (See Comments)    Blurry vision  Blurry vision    . Doxycycline Nausea Only    nausea  . Gabapentin Other (See Comments)    Muscle pain, fatigue  . Other   . Amoxicillin Nausea Only  . Codeine Nausea Only and Other (See Comments)    Hot flashes also  . Hydrocodone Itching, Nausea Only and Rash  . Omeprazole Itching and Rash  . Oxycodone Nausea Only, Rash and  Nausea And Vomiting  . Oxycodone-Acetaminophen Nausea And Vomiting and Rash  . Tramadol Itching, Nausea Only and Rash   Current Outpatient Medications on File Prior to Visit  Medication Sig Dispense Refill  . Abaloparatide (TYMLOS) 3120 MCG/1.56ML SOPN     . acetaminophen (TYLENOL) 500 MG tablet Take 1,000 mg by mouth daily as needed for moderate pain or headache.    . Azelaic Acid 15 % cream azelaic acid 15 % topical gel    . azithromycin (ZITHROMAX) 250 MG tablet 2 tabs po qd x 1 day; 1 tablet per day x 4 days; 6 tablet 0  . benzonatate (TESSALON) 200 MG capsule Take 1 capsule (200 mg total) by mouth 3 (three) times daily as needed. 30 capsule 0  . cetirizine (ZYRTEC) 10 MG tablet Take 1 tablet (10 mg total) by mouth daily. 30 tablet 11  . esomeprazole (NEXIUM) 20 MG capsule Take 20 mg by mouth daily.     Marland Kitchen estradiol (ESTRACE) 0.5 MG tablet Take 0.5 mg by mouth daily.    Marland Kitchen estradiol (ESTRACE) 1 MG tablet Take 1 mg by mouth daily.     Marland Kitchen gabapentin (NEURONTIN) 100 MG capsule Take 2 capsules (200 mg total) by mouth 3 (three) times daily as needed. 180 capsule 5  . hydrocortisone 2.5 % cream hydrocortisone 2.5 % topical cream    . IMVEXXY MAINTENANCE PACK 10 MCG INST INSERT 1 VAGINAL INSERT(S) TWICE A WEEK BY VAGINAL ROUTE.    Marland Kitchen ketoconazole (NIZORAL) 2 % cream ketoconazole 2 % topical cream    . losartan (COZAAR) 50 MG tablet TAKE 1 TABLET BY MOUTH EVERY DAY 30 tablet 8  . magic mouthwash SOLN Take 5 mLs by mouth 4 (four) times daily as needed for mouth pain. 150 mL 0  . Probiotic CAPS Take 1 capsule by mouth daily.    . promethazine (PHENERGAN) 25 MG tablet Take 25 mg by mouth daily as needed for nausea or vomiting.    . valACYclovir (VALTREX) 1000 MG tablet Take 1 tablet (1,000 mg total) by mouth 2 (two) times daily. 20 tablet 0   No current facility-administered medications on file prior to visit.        ROS:  All others reviewed and negative.  Objective        PE:  BP 126/72 (BP  Location: Left Arm, Patient Position: Sitting, Cuff Size: Normal)   Pulse (!) 102   Ht 5\' 5"  (1.651 m)   Wt 232 lb (105.2 kg)   SpO2 96%   BMI 38.61 kg/m                 Constitutional: Pt appears in NAD  HENT: Head: NCAT.  Bilat tm's with mild erythema.  Max sinus areas mild tender.  Pharynx with mild erythema, no exudate                Right Ear: External ear normal.                 Left Ear: External ear normal.                Eyes: . Pupils are equal, round, and reactive to light. Conjunctivae and EOM are normal               Nose: without d/c or deformity               Neck: Neck supple. Gross normal ROM               Cardiovascular: Normal rate and regular rhythm.                 Pulmonary/Chest: Effort normal and breath sounds without rales or wheezing.                Abd:  Soft, NT, ND, + BS, no organomegaly               Neurological: Pt is alert. At baseline orientation, motor grossly intact               Skin: Skin is warm. No rashes, no other new lesions, LE edema - none              Psychiatric: Pt behavior is normal without agitation   Micro: none  Cardiac tracings I have personally interpreted today:  none  Pertinent Radiological findings (summarize): none   Lab Results  Component Value Date   WBC 6.1 05/26/2020   HGB 14.0 05/26/2020   HCT 42.1 05/26/2020   PLT 204.0 05/26/2020   GLUCOSE 116 (H) 05/26/2020   CHOL 205 (H) 05/26/2020   TRIG 212.0 (H) 05/26/2020   HDL 53.60 05/26/2020   LDLDIRECT 124.0 05/26/2020   LDLCALC 103 (H) 09/22/2019   ALT 13 05/26/2020   AST 13 05/26/2020   NA 139 05/26/2020   K 3.9 05/26/2020   CL 102 05/26/2020   CREATININE 0.76 05/26/2020   BUN 14 05/26/2020   CO2 28 05/26/2020   TSH 1.08 05/26/2020   HGBA1C 6.3 05/26/2020   MICROALBUR <0.7 05/26/2020   POCT rapid strep A Order: [id number]  Status: Final result   Visible to patient: No (scheduled for 10/11/2020 5:32 PM)   Dx: Sore throat   0 Result  Notes   Ref Range & Units 16:32  Rapid Strep A Screen Negative Negative       Assessment/Plan:  [name] is a 60 y.o. White or Caucasian [1] female with  has a past medical history of Anemia, ANEMIA-IRON DEFICIENCY (01/03/2007), Anxiety, ANXIETY (01/03/2007), Complication of anesthesia, Depression, DEPRESSION (01/03/2007), DJD (degenerative joint disease), Enlarged thyroid (10/18/2019), Family history of adverse reaction to anesthesia, GERD (01/16/2009), GERD (gastroesophageal reflux disease), Headache(784.0) (01/03/2007), Hyperplastic colon polyp, HYPERSOMNIA (04/04/2010), Hypertension, IBS (01/16/2009), IBS (irritable bowel syndrome), Impaired glucose tolerance (05/18/2011), Menopause, Migraine, Obesity, OSTEOARTHRITIS, KNEES, 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  URI (upper respiratory infection) Mild to mod, for antibx course,  to f/u any worsening symptoms or concerns  Allergic rhinitis Mild to mod, for predpac asd,  to f/u any worsening symptoms or concerns  Vitamin D deficiency Last vitamin D Lab Results  Component  Value Date   VD25OH 31.58 05/26/2020   Ow normal, to increase oral replacement to 2000 u qd  Hypertriglyceridemia For lower fat diet, declines statin Lab Results  Component Value Date   CHOL 205 (H) 05/26/2020   HDL 53.60 05/26/2020   LDLCALC 103 (H) 09/22/2019   LDLDIRECT 124.0 05/26/2020   TRIG 212.0 (H) 05/26/2020   CHOLHDL 4 05/26/2020    Current Outpatient Medications (Endocrine & Metabolic):  .  predniSONE (DELTASONE) 10 MG tablet, 3 tabs by mouth per day for 3 days,2tabs per day for 3 days,1tab per day for 3 days .  Abaloparatide (TYMLOS) 3120 MCG/1.56ML SOPN,  .  estradiol (ESTRACE) 0.5 MG tablet, Take 0.5 mg by mouth daily. Marland Kitchen  estradiol (ESTRACE) 1 MG tablet, Take 1 mg by mouth daily.   Current Outpatient Medications (Cardiovascular):  .  losartan (COZAAR) 50 MG tablet, TAKE 1 TABLET BY MOUTH  EVERY DAY  Current Outpatient Medications (Respiratory):  .  benzonatate (TESSALON) 200 MG capsule, Take 1 capsule (200 mg total) by mouth 3 (three) times daily as needed. .  cetirizine (ZYRTEC) 10 MG tablet, Take 1 tablet (10 mg total) by mouth daily. .  magic mouthwash SOLN*, Take 5 mLs by mouth 4 (four) times daily as needed for mouth pain. .  promethazine (PHENERGAN) 25 MG tablet, Take 25 mg by mouth daily as needed for nausea or vomiting.  Current Outpatient Medications (Analgesics):  .  acetaminophen (TYLENOL) 500 MG tablet, Take 1,000 mg by mouth daily as needed for moderate pain or headache.   Current Outpatient Medications (Other):  .  levofloxacin (LEVAQUIN) 500 MG tablet, Take 1 tablet (500 mg total) by mouth daily for 10 days. .  Azelaic Acid 15 % cream, azelaic acid 15 % topical gel .  azithromycin (ZITHROMAX) 250 MG tablet, 2 tabs po qd x 1 day; 1 tablet per day x 4 days; .  esomeprazole (NEXIUM) 20 MG capsule, Take 20 mg by mouth daily.  Marland Kitchen  gabapentin (NEURONTIN) 100 MG capsule, Take 2 capsules (200 mg total) by mouth 3 (three) times daily as needed. .  hydrocortisone 2.5 % cream, hydrocortisone 2.5 % topical cream .  IMVEXXY MAINTENANCE PACK 10 MCG INST, INSERT 1 VAGINAL INSERT(S) TWICE A WEEK BY VAGINAL ROUTE. Marland Kitchen  ketoconazole (NIZORAL) 2 % cream, ketoconazole 2 % topical cream .  magic mouthwash SOLN*, Take 5 mLs by mouth 4 (four) times daily as needed for mouth pain. .  Probiotic CAPS, Take 1 capsule by mouth daily. .  valACYclovir (VALTREX) 1000 MG tablet, Take 1 tablet (1,000 mg total) by mouth 2 (two) times daily. * These medications belong to multiple therapeutic classes and are listed under each applicable group.   Hyperglycemia Lab Results  Component Value Date   HGBA1C 6.3 05/26/2020   Stable, pt to continue current medical treatment  - diet   Fatigue Etiology unclear, Exam otherwise benign, to check labs as documented, follow with expectant  management  Followup: Return in about 6 months (around 04/12/2021).  Oliver Barre, MD 10/15/2020 5:01 PM [facility]

## 2020-10-11 NOTE — Patient Instructions (Signed)
Your Strep test was negative  You had the steroid shot today  Please take all new medication as prescribed - the antibiotic, and prednisone  Please continue all other medications as before, and refills have been done if requested.  Please have the pharmacy call with any other refills you may need.  Please continue your efforts at being more active, low cholesterol diet, and weight control.  Please keep your appointments with your specialists as you may have planned  Please go to the LAB at the blood drawing area for the tests to be done - at the [facility] lab at your convenience

## 2020-10-12 ENCOUNTER — Other Ambulatory Visit: Payer: 59

## 2020-10-15 ENCOUNTER — Encounter: Payer: Self-pay | Admitting: Internal Medicine

## 2020-10-15 NOTE — Assessment & Plan Note (Signed)
Mild to mod, for antibx course,  to f/u any worsening symptoms or concerns 

## 2020-10-15 NOTE — Assessment & Plan Note (Signed)
/  Mild to mod, for predpac asd,  to f/u any worsening symptoms or concerns 

## 2020-10-15 NOTE — Assessment & Plan Note (Signed)
Last vitamin D Lab Results  Component Value Date   VD25OH 31.58 05/26/2020   Ow normal, to increase oral replacement to 2000 u qd

## 2020-10-15 NOTE — Assessment & Plan Note (Signed)
For lower fat diet, declines statin Lab Results  Component Value Date   CHOL 205 (H) 05/26/2020   HDL 53.60 05/26/2020   LDLCALC 103 (H) 09/22/2019   LDLDIRECT 124.0 05/26/2020   TRIG 212.0 (H) 05/26/2020   CHOLHDL 4 05/26/2020    Current Outpatient Medications (Endocrine & Metabolic):  .  predniSONE (DELTASONE) 10 MG tablet, 3 tabs by mouth per day for 3 days,2tabs per day for 3 days,1tab per day for 3 days .  Abaloparatide (TYMLOS) 3120 MCG/1.56ML SOPN,  .  estradiol (ESTRACE) 0.5 MG tablet, Take 0.5 mg by mouth daily. Marland Kitchen  estradiol (ESTRACE) 1 MG tablet, Take 1 mg by mouth daily.   Current Outpatient Medications (Cardiovascular):  .  losartan (COZAAR) 50 MG tablet, TAKE 1 TABLET BY MOUTH EVERY DAY  Current Outpatient Medications (Respiratory):  .  benzonatate (TESSALON) 200 MG capsule, Take 1 capsule (200 mg total) by mouth 3 (three) times daily as needed. .  cetirizine (ZYRTEC) 10 MG tablet, Take 1 tablet (10 mg total) by mouth daily. .  magic mouthwash SOLN*, Take 5 mLs by mouth 4 (four) times daily as needed for mouth pain. .  promethazine (PHENERGAN) 25 MG tablet, Take 25 mg by mouth daily as needed for nausea or vomiting.  Current Outpatient Medications (Analgesics):  .  acetaminophen (TYLENOL) 500 MG tablet, Take 1,000 mg by mouth daily as needed for moderate pain or headache.   Current Outpatient Medications (Other):  .  levofloxacin (LEVAQUIN) 500 MG tablet, Take 1 tablet (500 mg total) by mouth daily for 10 days. .  Azelaic Acid 15 % cream, azelaic acid 15 % topical gel .  azithromycin (ZITHROMAX) 250 MG tablet, 2 tabs po qd x 1 day; 1 tablet per day x 4 days; .  esomeprazole (NEXIUM) 20 MG capsule, Take 20 mg by mouth daily.  Marland Kitchen  gabapentin (NEURONTIN) 100 MG capsule, Take 2 capsules (200 mg total) by mouth 3 (three) times daily as needed. .  hydrocortisone 2.5 % cream, hydrocortisone 2.5 % topical cream .  IMVEXXY MAINTENANCE PACK 10 MCG INST, INSERT 1 VAGINAL  INSERT(S) TWICE A WEEK BY VAGINAL ROUTE. Marland Kitchen  ketoconazole (NIZORAL) 2 % cream, ketoconazole 2 % topical cream .  magic mouthwash SOLN*, Take 5 mLs by mouth 4 (four) times daily as needed for mouth pain. .  Probiotic CAPS, Take 1 capsule by mouth daily. .  valACYclovir (VALTREX) 1000 MG tablet, Take 1 tablet (1,000 mg total) by mouth 2 (two) times daily. * These medications belong to multiple therapeutic classes and are listed under each applicable group.

## 2020-10-15 NOTE — Assessment & Plan Note (Signed)
Etiology unclear, Exam otherwise benign, to check labs as documented, follow with expectant management  

## 2020-10-15 NOTE — Assessment & Plan Note (Signed)
Lab Results  Component Value Date   HGBA1C 6.3 05/26/2020   Stable, pt to continue current medical treatment  - diet

## 2020-10-19 ENCOUNTER — Ambulatory Visit (INDEPENDENT_AMBULATORY_CARE_PROVIDER_SITE_OTHER): Payer: 59 | Admitting: Podiatry

## 2020-10-19 ENCOUNTER — Encounter: Payer: Self-pay | Admitting: Podiatry

## 2020-10-19 ENCOUNTER — Other Ambulatory Visit: Payer: Self-pay

## 2020-10-19 DIAGNOSIS — M6740 Ganglion, unspecified site: Secondary | ICD-10-CM | POA: Diagnosis not present

## 2020-10-19 DIAGNOSIS — R600 Localized edema: Secondary | ICD-10-CM

## 2020-10-19 NOTE — Progress Notes (Signed)
Xray not needed

## 2020-10-19 NOTE — Progress Notes (Signed)
Subjective:   Patient ID: [name], female   DOB: 60 y.o.   MRN: [id number]   HPI Patient presents stating that she is doing well with surgery but still getting some swelling and feels like there is some popping   ROS      Objective:  Physical Exam  Neurovascular status intact left lateral foot healing well wound edges well coapted mild edema negative Denna Haggard' sign noted do     Assessment:  And well post ganglionic cyst resection left     Plan:  Went ahead dispensed ankle compression stocking today gave instructions for elevation and ice as needed and return to gradual activity.  Reappoint as needed

## 2020-10-20 ENCOUNTER — Encounter: Payer: Self-pay | Admitting: Podiatry

## 2020-10-25 ENCOUNTER — Encounter: Payer: Self-pay | Admitting: Internal Medicine

## 2020-10-25 ENCOUNTER — Other Ambulatory Visit: Payer: Self-pay | Admitting: Internal Medicine

## 2020-10-25 ENCOUNTER — Ambulatory Visit
Admission: RE | Admit: 2020-10-25 | Discharge: 2020-10-25 | Disposition: A | Discharge status: Home / Self Care | Payer: 59 | Source: Ambulatory Visit | Attending: Cardiovascular Disease | Admitting: Cardiovascular Disease

## 2020-10-25 ENCOUNTER — Other Ambulatory Visit (INDEPENDENT_AMBULATORY_CARE_PROVIDER_SITE_OTHER): Payer: 59

## 2020-10-25 DIAGNOSIS — E559 Vitamin D deficiency, unspecified: Secondary | ICD-10-CM | POA: Diagnosis not present

## 2020-10-25 DIAGNOSIS — E781 Pure hyperglyceridemia: Secondary | ICD-10-CM | POA: Diagnosis not present

## 2020-10-25 DIAGNOSIS — R5383 Other fatigue: Secondary | ICD-10-CM | POA: Diagnosis not present

## 2020-10-25 DIAGNOSIS — R739 Hyperglycemia, unspecified: Secondary | ICD-10-CM

## 2020-10-25 DIAGNOSIS — E041 Nontoxic single thyroid nodule: Secondary | ICD-10-CM

## 2020-10-25 LAB — LIPID PANEL
Cholesterol: 194 mg/dL (ref 0–200)
HDL: 60.1 mg/dL (ref >39.00)
LDL Cholesterol: 106 mg/dL — ABNORMAL HIGH (ref 0–99)
NonHDL: 133.88
Total CHOL/HDL Ratio: 3
Triglycerides: 140 mg/dL (ref 0.0–149.0)
VLDL: 28 mg/dL (ref 0.0–40.0)

## 2020-10-25 LAB — TSH: TSH: 1.07 u[IU]/mL (ref 0.35–4.50)

## 2020-10-25 LAB — HEMOGLOBIN A1C: Hgb A1c MFr Bld: 7 % — ABNORMAL HIGH (ref 4.6–6.5)

## 2020-10-25 LAB — VITAMIN D 25 HYDROXY (VIT D DEFICIENCY, FRACTURES): VITD: 25.7 ng/mL — ABNORMAL LOW (ref 30.00–100.00)

## 2020-10-25 MED ORDER — THERA-D 2000 50 MCG (2000 UT) PO TABS: ORAL_TABLET | ORAL | 99 refills | Status: DC

## 2020-10-26 ENCOUNTER — Other Ambulatory Visit: Payer: Self-pay

## 2020-10-26 ENCOUNTER — Ambulatory Visit: Payer: 59 | Admitting: Sports Medicine

## 2020-10-26 ENCOUNTER — Encounter: Payer: Self-pay | Admitting: Sports Medicine

## 2020-10-26 VITALS — BP 143/63 | Ht 65.0 in | Wt 230.0 lb

## 2020-10-26 DIAGNOSIS — G8929 Other chronic pain: Secondary | ICD-10-CM

## 2020-10-26 DIAGNOSIS — M79645 Pain in left finger(s): Secondary | ICD-10-CM

## 2020-10-26 NOTE — Progress Notes (Signed)
   Subjective:    Patient ID: [name], female    DOB: Jan 02, 1961, 60 y.o.   MRN: [id number]  HPI chief complaint: Left thumb pain  [name] comes in today with returning left thumb pain.  She was last seen in the office about a year ago.  Left CMC joint was injected with steroid which did help.  Recently her pain has begun to return.  It is the same pain that she experienced previously.  She has not tried to treat this at home.  It is worse with activity such as gardening.  Interim medical history reviewed Medications reviewed Allergies reviewed    Review of Systems As above    Objective:   Physical Exam  Well-developed, well-nourished.  No acute distress  Left thumb: There is mild swelling at the [facility] joint.  Tenderness to palpation here as well.  Limited active and passive range of motion.  Positive CMC grind.  No swelling in the first dorsal compartment.  Brisk capillary refill.      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  Patient is referred for a custom CMC splint.  She has Voltaren which she uses on both knees and I recommended that she apply it 2-3 times daily to her thumb.  She will return to the office in 3 weeks for reevaluation.  If symptoms persist despite today's treatment, consider repeat cortisone injection.  Call with questions or concerns in the interim.

## 2020-11-01 ENCOUNTER — Ambulatory Visit
Admission: RE | Admit: 2020-11-01 | Discharge: 2020-11-01 | Disposition: A | Discharge status: Home / Self Care | Payer: 59 | Source: Ambulatory Visit | Attending: Sports Medicine | Admitting: Sports Medicine

## 2020-11-01 DIAGNOSIS — G8929 Other chronic pain: Secondary | ICD-10-CM

## 2020-11-14 ENCOUNTER — Other Ambulatory Visit: Payer: Self-pay

## 2020-11-14 ENCOUNTER — Ambulatory Visit: Payer: 59 | Admitting: Sports Medicine

## 2020-11-14 VITALS — BP 146/90 | Ht 65.0 in | Wt 233.0 lb

## 2020-11-14 DIAGNOSIS — M1812 Unilateral primary osteoarthritis of first carpometacarpal joint, left hand: Secondary | ICD-10-CM

## 2020-11-14 MED ORDER — METHYLPREDNISOLONE ACETATE 40 MG/ML IJ SUSP
20.0000 mg | Freq: Once | INTRAMUSCULAR | Status: AC
  Administered 2020-11-14: 20 mg via INTRA_ARTICULAR

## 2020-11-14 NOTE — Progress Notes (Signed)
   Subjective:    Patient ID: [name], female    DOB: [birth year], 60 y.o.   MRN: [id number]  HPI   [name] comes in today for follow-up on left thumb pain secondary to [facility] osteoarthritis.  She did get a custom CMC splint which has been minimally helpful.  Voltaren gel has also been somewhat helpful but she would like to proceed with a cortisone injection today.  Recent x-ray showed moderately advanced CMC OA and nothing acute.  She is left-hand dominant.    Review of Systems As above    Objective:   Physical Exam  Well-developed, well-nourished.  No acute distress  Left thumb: Mild swelling around the [facility] joint.  Slight tenderness to palpation.  Positive CMC grind.  No swelling in the first dorsal compartment.  Brisk capillary refill.     Assessment & Plan:   Left thumb pain secondary to [facility] osteoarthritis  Patient's left CMC joint was injected today with steroids under ultrasound guidance.  Patient tolerates this without difficulty.  Continue with her custom splint as needed.  If symptoms persist despite today's injection, consider referral to Dr. Amanda Pea to discuss other treatment options.  Follow-up as needed.  Consent obtained and verified. Time-out conducted. Noted no overlying erythema, induration, or other signs of local infection. Skin prepped in a sterile fashion. Topical analgesic spray: Ethyl chloride. Joint: left cmc Needle: 30g TB needle Completed without difficulty. Meds: 0.5cc 1% xylocaine, 0.5cc (20mg ) depomedrol  Advised to call if fevers/chills, erythema, induration, drainage, or persistent bleeding.

## 2020-11-16 ENCOUNTER — Ambulatory Visit: Payer: 59 | Admitting: Sports Medicine

## 2020-12-04 ENCOUNTER — Other Ambulatory Visit: Payer: Self-pay

## 2020-12-04 ENCOUNTER — Ambulatory Visit: Payer: 59 | Admitting: Podiatry

## 2020-12-04 DIAGNOSIS — R600 Localized edema: Secondary | ICD-10-CM

## 2020-12-04 DIAGNOSIS — M6740 Ganglion, unspecified site: Secondary | ICD-10-CM

## 2020-12-04 DIAGNOSIS — I872 Venous insufficiency (chronic) (peripheral): Secondary | ICD-10-CM

## 2020-12-11 NOTE — Progress Notes (Signed)
Subjective: 60 year old female presents the office today for concerns of left foot pain.  She recently had surgery with Dr. Charlsie Merles to have a soft tissue mass removed on September 19, 2020.  She said that she still having swelling but she is no swelling to the leg which is intermittent.  She does get swelling to both of her legs.  Her main concern she gets pain in the top of her foot and hurts to bend.  At times her left second toe feels cold.  She states that she has a bumblebee feeling and she is currently on gabapentin 100 mg daily which does help.  She denies any recent injury. Denies any systemic complaints such as fevers, chills, nausea, vomiting. No acute changes since last appointment, and no other complaints at this time.   Objective: AAO x3, NAD DP/PT pulses palpable bilaterally, CRT less than 3 seconds Incision from the prior surgery is well-healed.  There is no specific area of pinpoint tenderness.  There is chronic appearing edema to bilateral lower extremities and there is no erythema or warmth and there is no pain with calf compression.  No significant tenderness palpation on the surgical site.  Diffuse tenderness to the dorsum of the foot there is no specific area of pinpoint tenderness.  No pain with calf compression, swelling, warmth, erythema  Assessment: Status post left foot surgery with dorsal midfoot pain, neuropathy; concern for venous insufficiency given bilateral lower extremity edema  Plan: -All treatment options discussed with the patient including all alternatives, risks, complications.  -At this time given the edema which seems to be more chronic and refer to vascular surgery will any venous insufficiency issues. -Discussed gabapentin.  Was written for 200 mg 3 times a day by her PCP.  She can continue with this dose.  Apparently she is taking 100 mg daily. -Compression, elevation. -Patient encouraged to call the office with any questions, concerns, change in symptoms.    Vivi Barrack DPM

## 2021-01-11 DIAGNOSIS — M778 Other enthesopathies, not elsewhere classified: Secondary | ICD-10-CM | POA: Insufficient documentation

## 2021-01-11 DIAGNOSIS — G609 Hereditary and idiopathic neuropathy, unspecified: Secondary | ICD-10-CM | POA: Insufficient documentation

## 2021-01-15 ENCOUNTER — Ambulatory Visit: Payer: 59 | Admitting: Podiatry

## 2021-02-22 ENCOUNTER — Telehealth (INDEPENDENT_AMBULATORY_CARE_PROVIDER_SITE_OTHER): Payer: 59 | Admitting: Internal Medicine

## 2021-02-22 DIAGNOSIS — F411 Generalized anxiety disorder: Secondary | ICD-10-CM

## 2021-02-22 DIAGNOSIS — R739 Hyperglycemia, unspecified: Secondary | ICD-10-CM

## 2021-02-22 DIAGNOSIS — U071 COVID-19: Secondary | ICD-10-CM | POA: Diagnosis not present

## 2021-02-22 MED ORDER — MOLNUPIRAVIR EUA 200MG CAPSULE: 4.0000 | ORAL_CAPSULE | Freq: Two times a day (BID) | ORAL | 0 refills | Status: AC

## 2021-02-22 MED ORDER — DEXTROMETHORPHAN-GUAIFENESIN 10-100 MG/5ML PO SYRP: 5.0000 mL | ORAL_SOLUTION | Freq: Two times a day (BID) | ORAL | 1 refills | Status: DC

## 2021-02-22 NOTE — Progress Notes (Signed)
Patient ID: [name], female   DOB: 05-24-1961, 60 y.o.   MRN: [id number] Virtual Visit via Video Note  I connected with [name] on 02/22/21 at  9:40 AM EDT by a video enabled telemedicine application and verified that I am speaking with the correct person using two identifiers.  Location of all participants today Patient: at home Provider: at office   I discussed the limitations of evaluation and management by telemedicine and the availability of in person appointments. The patient expressed understanding and agreed to proceed.  History of Present Illness: 60yo F here states husband with known covid + 5 days ago, now she has symptoms in the past 4 days, initially thought allergy related but tested + herself 2 days ago.  Has had ST non prod cough, feels sore all over, HA , mild nausea, diarrhea, fatigue; tylenol helps some with zyrtec; taste and smell ok; pt is covid vax x 2 and booster x 1, no prior hx of covid infection  Pt denies chest pain, increased sob or doe, wheezing, orthopnea, PND, increased LE swelling, palpitations, dizziness or syncope.   Pt denies polydipsia, polyuria, or new focal neuro s/s.   Pt denies wt loss, night sweats, loss of appetite, or other constitutional symptoms . Denies worsening depressive symptoms, suicidal ideation, or panic Past Medical History:  Diagnosis Date   Anemia    ANEMIA-IRON DEFICIENCY 01/03/2007   Anxiety    ANXIETY 01/03/2007   Complication of anesthesia    severe post-operative nausea except for bladder surgery in 2002 at [facility]-did not have complications then   Depression    DEPRESSION 01/03/2007   DJD (degenerative joint disease)    knee   Enlarged thyroid 10/18/2019   Family history of adverse reaction to anesthesia    mother and father both had severe post-operative nausea and vomiting   GERD 01/16/2009   GERD (gastroesophageal reflux disease)    Headache(784.0) 01/03/2007   Hyperplastic colon polyp    04/2010    HYPERSOMNIA 04/04/2010   Hypertension    IBS 01/16/2009   IBS (irritable bowel syndrome)    Impaired glucose tolerance 05/18/2011   Menopause    early   Migraine    Obesity    OSTEOARTHRITIS, KNEES, 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 She has never used smokeless tobacco. She reports that she does not drink alcohol and does not use drugs. family history includes Diabetes in her brother; Hypertension in her mother. Allergies  Allergen Reactions   Meperidine Shortness Of Breath   Morphine Anaphylaxis   Penicillins Hives, Nausea Only, Swelling, Other (See Comments), Anaphylaxis and Nausea And Vomiting    PATIENT HAS HAD A PCN REACTION WITH IMMEDIATE RASH, FACIAL/TONGUE/THROAT SWELLING, SOB, OR LIGHTHEADEDNESS WITH HYPOTENSION:  #  #  YES  #  #  HAS PT DEVELOPED SEVERE RASH INVOLVING MUCUS MEMBRANES or SKIN NECROSIS: #  #  YES  #  #  PATIENT HAS HAD A PCN REACTION THAT REQUIRED HOSPITALIZATION:  #  #  YES  #  #  Has patient had a PCN reaction occurring within the last 10 years: No  vomiting PATIENT HAS HAD  A PCN REACTION WITH IMMEDIATE RASH, FACIAL/TONGUE/THROAT SWELLING, SOB, OR LIGHTHEADEDNESS WITH HYPOTENSION:  #  #  YES  #  #  HAS PT DEVELOPED SEVERE RASH INVOLVING MUCUS MEMBRANES or SKIN NECROSIS: #  #  YES  #  #  PATIENT HAS HAD A PCN REACTION THAT REQUIRED HOSPITALIZATION:  #  #  YES  #  #  Has patient had a PCN reaction occurring within the last 10 years: No   Azithromycin Nausea And Vomiting and Nausea Only   Lisinopril Cough        Sucralfate Rash and Other (See Comments)    Blurry vision  Blurry vision     Doxycycline Nausea Only    nausea   Gabapentin Other (See Comments)    Muscle pain, fatigue    Other    Amoxicillin Nausea Only   Codeine Nausea Only and Other (See Comments)    Hot flashes also   Hydrocodone Itching, Nausea Only and Rash   Omeprazole Itching and Rash   Oxycodone Nausea Only, Rash and Nausea And Vomiting   Oxycodone-Acetaminophen Nausea And Vomiting and Rash   Tramadol Itching, Nausea Only and Rash   Current Outpatient Medications on File Prior to Visit  Medication Sig Dispense Refill   Abaloparatide (TYMLOS) 3120 MCG/1.56ML SOPN      acetaminophen (TYLENOL) 500 MG tablet Take 1,000 mg by mouth daily as needed for moderate pain or headache.     Azelaic Acid 15 % cream azelaic acid 15 % topical gel     azithromycin (ZITHROMAX) 250 MG tablet 2 tabs po qd x 1 day; 1 tablet per day x 4 days; 6 tablet 0   benzonatate (TESSALON) 200 MG capsule Take 1 capsule (200 mg total) by mouth 3 (three) times daily as needed. 30 capsule 0   cetirizine (ZYRTEC) 10 MG tablet Take 1 tablet (10 mg total) by mouth daily. 30 tablet 11   Cholecalciferol (THERA-D 2000) 50 MCG (2000 UT) TABS 1 tab by mouth once daily 30 tablet 99   esomeprazole (NEXIUM) 20 MG capsule Take 20 mg by mouth daily.      estradiol (ESTRACE) 0.5 MG tablet Take 0.5 mg by mouth daily.     estradiol (ESTRACE) 1 MG tablet Take 1 mg by mouth daily.      gabapentin (NEURONTIN) 100 MG capsule Take 2 capsules (200 mg total) by mouth 3 (three) times daily as needed. 180 capsule 5   hydrocortisone 2.5 % cream hydrocortisone 2.5 % topical cream     IMVEXXY MAINTENANCE PACK 10 MCG INST INSERT 1 VAGINAL INSERT(S) TWICE A WEEK BY VAGINAL ROUTE.     ketoconazole (NIZORAL) 2 % cream ketoconazole 2 % topical cream     losartan (COZAAR) 50 MG tablet TAKE 1 TABLET BY MOUTH EVERY DAY 30 tablet 8   magic mouthwash SOLN Take 5 mLs by mouth 4 (four) times daily as needed for mouth pain. 150 mL 0   predniSONE (DELTASONE) 10 MG tablet 3 tabs by mouth per day for 3 days,2tabs per day for 3 days,1tab per day for 3 days 18 tablet 0    Probiotic CAPS Take 1 capsule by mouth daily.     promethazine (PHENERGAN) 25 MG tablet Take 25 mg by mouth daily as needed for nausea or vomiting.     valACYclovir (VALTREX) 1000 MG tablet Take 1 tablet (1,000 mg total) by mouth 2 (two) times daily. 20 tablet 0   No current facility-administered medications on file prior to  visit.    Observations/Objective: Alert, NAD, appropriate mood and affect, resps normal, cn 2-12 intact, moves all 4s, no visible rash or swelling Lab Results  Component Value Date   WBC 6.1 05/26/2020   HGB 14.0 05/26/2020   HCT 42.1 05/26/2020   PLT 204.0 05/26/2020   GLUCOSE 116 (H) 05/26/2020   CHOL 194 10/25/2020   TRIG 140.0 10/25/2020   HDL 60.10 10/25/2020   LDLDIRECT 124.0 05/26/2020   LDLCALC 106 (H) 10/25/2020   ALT 13 05/26/2020   AST 13 05/26/2020   NA 139 05/26/2020   K 3.9 05/26/2020   CL 102 05/26/2020   CREATININE 0.76 05/26/2020   BUN 14 05/26/2020   CO2 28 05/26/2020   TSH 1.07 10/25/2020   HGBA1C 7.0 (H) 10/25/2020   MICROALBUR <0.7 05/26/2020   Assessment and Plan: See notes  Follow Up Instructions: See notes  I discussed the assessment and treatment plan with the patient. The patient was provided an opportunity to ask questions and all were answered. The patient agreed with the plan and demonstrated an understanding of the instructions.   The patient was advised to call back or seek an in-person evaluation if the symptoms worsen or if the condition fails to improve as anticipated.   Oliver Barre, MD

## 2021-02-26 ENCOUNTER — Encounter: Payer: Self-pay | Admitting: Internal Medicine

## 2021-02-26 NOTE — Patient Instructions (Signed)
Please take all new medication as prescribed 

## 2021-02-26 NOTE — Assessment & Plan Note (Signed)
New onset < 5 days, pt does not want paxlovid, ok for molnupiraivr and cough med prn, d/w pt natural hx of disease and to f/u and worsening s/s 

## 2021-02-26 NOTE — Assessment & Plan Note (Signed)
Lab Results  Component Value Date   HGBA1C 7.0 (H) 10/25/2020   Stable, pt to continue current medical treatment  - diet

## 2021-02-26 NOTE — Assessment & Plan Note (Signed)
Overall mild situational today, ok to continue current med tx - reassurance, decliens further med tx

## 2021-03-01 ENCOUNTER — Ambulatory Visit (HOSPITAL_BASED_OUTPATIENT_CLINIC_OR_DEPARTMENT_OTHER): Payer: 59 | Admitting: Cardiovascular Disease

## 2021-03-08 ENCOUNTER — Ambulatory Visit (INDEPENDENT_AMBULATORY_CARE_PROVIDER_SITE_OTHER): Payer: 59

## 2021-03-08 DIAGNOSIS — Z23 Encounter for immunization: Secondary | ICD-10-CM

## 2021-03-15 DIAGNOSIS — M25572 Pain in left ankle and joints of left foot: Secondary | ICD-10-CM | POA: Insufficient documentation

## 2021-04-20 ENCOUNTER — Ambulatory Visit (HOSPITAL_BASED_OUTPATIENT_CLINIC_OR_DEPARTMENT_OTHER): Payer: 59 | Admitting: Cardiovascular Disease

## 2021-04-25 NOTE — Progress Notes (Signed)
Cardiology Office Note   Date:  04/26/2021   ID:  Stacey BombardSylvia B [name], DOB 09/12/1960, MRN [id number]  PCP:  [name], [name], MD  Cardiologist:   [name], MD   No chief complaint on file.   History of Present Illness: [name] is a 60 y.o. female with hypertension, gastroesophageal reflux disease and depression who presents for follow up.  Ms. [name] was referred 04/2015 for a complaint of dizziness and dyspnea on exertion.  EKG showed sinus rhythm with low voltage and poor R wave progression.  BNP was 17.  The episodes occurred with exertion and were associated with a flushed feeling.  Her symptoms were felt to be neurocardiogenic and she was asked to wear compression stockings (also to help with her varicose veins) and increase her fluid intake.  Lab testing for pheochromocytoma and carcinoid were negative.  She underwent Lexiscan Cardiolite 04/2015 that was negative for ischemia and echocardiogram 05/2015 revealed grade 1 diastolic dysfunction.  Her blood pressure was intermittently elevated. She reported having a lot of anxiety and stress at the time. She was instructed to follow-up with her PCP or psychiatrist to discuss her anxiety.  Her blood pressure remained elevated so she was started on lisinopril.  This was switched to losartan due to cough. She reported lower extremity numbness and claudication.  She was referred for ABIs 01/30/17 that were normal bilaterally. Her thyroid was enlarged and she was referred for thyroid ultrasound which revealed multiple bilateral nodules.  She underwent thyroid biopsy which was consistent with benign follicular nodule and it was recommended that she have serial ultrasounds.   At her last appointment, her blood pressure was mildly elevated but no changes were made because it was well controlled at home. Today, she is doing alright overall. She underwent an ablation last Friday and her back pain and R SI joint has been bothering her. The R hip  pain diffuses into her R leg. Her movement is limited and moving or standing is difficult for her. The pain also prevents her from sleeping well. She uses Icy-Hot and pain killers like Tylenol to mitigate the pain. Her blood pressure has been high recently which she attributes to the pain. She reports measurements from the doctor in the 200s systolic. However, she does not check her blood pressure at home. She also reports chest tightness which she attributes to the high blood pressure. The first episode occurred a few weeks ago when she was in severe leg pain. The latest episode occurred yesterday while she was shopping with her son. However, she did not feel the tightness this morning. She has been unable to exercise with the most she can tolerate is 7000 steps. She denies any palpitations, or shortness of breath, lightheadedness, headaches, syncope, orthopnea, PND, or lower extremity edema.   Past Medical History:  Diagnosis Date   Anemia    ANEMIA-IRON DEFICIENCY 01/03/2007   Anxiety    ANXIETY 01/03/2007   Complication of anesthesia    severe post-operative nausea except for bladder surgery in 2002 at [facility]-did not have complications then   Depression    DEPRESSION 01/03/2007   DJD (degenerative joint disease)    knee   Enlarged thyroid 10/18/2019   Family history of adverse reaction to anesthesia    mother and father both had severe post-operative nausea and vomiting   GERD 01/16/2009   GERD (gastroesophageal reflux disease)    Headache(784.0) 01/03/2007   Hyperplastic colon polyp    04/2010  HYPERSOMNIA 04/04/2010   Hypertension    IBS 01/16/2009   IBS (irritable bowel syndrome)    Impaired glucose tolerance 05/18/2011   Menopause    early   Migraine    Obesity    OSTEOARTHRITIS, KNEES, 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     Cholecalciferol (THERA-D 2000) 50 MCG (2000 UT) TABS 1 tab by mouth once daily 30 tablet 99   esomeprazole (NEXIUM) 20 MG capsule Take 20 mg by mouth daily.      estradiol (ESTRACE) 0.5 MG tablet Take 0.5 mg by mouth daily.     gabapentin (NEURONTIN) 100 MG capsule Take 2 capsules (200 mg total) by mouth 3 (three) times daily as needed. 180 capsule 5   hydrocortisone 2.5 % cream hydrocortisone 2.5 % topical cream     ketoconazole (NIZORAL) 2 % cream ketoconazole 2 % topical cream     losartan (COZAAR) 50 MG tablet TAKE 1 TABLET BY MOUTH EVERY DAY 30 tablet 8   Probiotic CAPS Take 1 capsule by mouth daily.     promethazine (PHENERGAN) 25 MG tablet Take 25 mg by mouth daily as needed for nausea or vomiting.     cetirizine (ZYRTEC) 10 MG tablet Take 1 tablet (10 mg total) by mouth daily. 30 tablet 11   No current facility-administered medications for this visit.    Allergies:   Meperidine, Morphine, Penicillins, Azithromycin, Lisinopril, Sucralfate, Doxycycline, Gabapentin, Other, Amoxicillin, Codeine, Hydrocodone, Omeprazole, Oxycodone, Oxycodone-acetaminophen, and Tramadol    Social History:  The patient  reports that she has never smoked. She has never used smokeless tobacco. She reports that she does not drink alcohol and does not use drugs.   Family History:  The patient's family history includes Diabetes in her brother; Hypertension in her mother.    ROS:  Please see the history of present illness.  (+) Back pain   (+) R hip pain (+) Chest tightness  All other systems are reviewed and negative.    PHYSICAL EXAM: VS:  BP 120/70 (BP Location: Right Arm, Patient Position: Sitting, Cuff Size: Large)   Pulse (!) 59   Ht  5\' 5"  (1.651 m)   Wt 234 lb 4.8 oz (106.3 kg)   BMI 38.99 kg/m  , BMI Body mass index is 38.99 kg/m. GENERAL:  Well appearing HEENT: Pupils equal round and reactive, fundi not visualized, oral mucosa unremarkable NECK:  No jugular venous distention, waveform within normal limits, carotid upstroke brisk and symmetric, no bruits, + thyromegaly LUNGS:  Clear to auscultation bilaterally HEART:  RRR.  PMI not displaced or sustained,S1 and S2 within normal limits, no S3, no S4, no clicks, no rubs, no murmurs ABD:  Flat, positive bowel sounds normal in frequency in pitch, no bruits, no rebound, no guarding, no midline pulsatile mass, no hepatomegaly, no splenomegaly EXT:  2 plus radial pulses.  1+ DP/PT bilaterally.  No edema, no cyanosis no clubbing SKIN:  No rashes no nodules NEURO:  Cranial nerves II through XII grossly intact, motor grossly  intact throughout [facility]:  Cognitively intact, oriented to person place and time  EKG:   04/26/21: Sinus bradycardia, rate 59 bpm. Low voltage 09/29/19: Sinus rhythm.  Rate 66 bpm.  Low voltage.  Poor  R wave progression. 02/25/19: Sinus bradycardia.  Rate 53 bpm.  Nonspecific T wave abnormalities. 02/24/2018: Sinus rhythm.  Rate 67 bpm. 05/08/17: Sinus rhythm.  Rate 76 bpm.  Poor R wave progression.  Low voltage limb leads. 08/02/16: Sinus rhythm.  Rate 78 bpm.   01/12/16: Sinus bradycardia.  Rate 56 bpm.    Echo 06/07/15: Study Conclusions  - Left ventricle: The cavity size was normal. There was mild focal   basal hypertrophy of the septum. Systolic function was vigorous.   The estimated ejection fraction was in the range of 65% to 70%.   Wall motion was normal; there were no regional wall motion   abnormalities. Doppler parameters are consistent with abnormal   left ventricular relaxation (grade 1 diastolic dysfunction).   There was no evidence of elevated ventricular filling pressure by   Doppler parameters. - Aortic valve: Trileaflet; normal thickness  leaflets. There was no   regurgitation. - Aortic root: The aortic root was normal in size. - Ascending aorta: The ascending aorta was normal in size. - Mitral valve: There was trivial regurgitation. - Left atrium: The atrium was normal in size. - Right ventricle: Systolic function was normal. - Tricuspid valve: There was trivial regurgitation. - Pulmonic valve: There was no regurgitation. - Pulmonary arteries: Systolic pressure was within the normal   range. - Inferior vena cava: The vessel was normal in size. - Pericardium, extracardiac: There was no pericardial effusion.  Lexiscan Cardiolite 05/24/15: Nuclear stress EF: 73%. The left ventricular ejection fraction is hyperdynamic (>65%). There was no ST segment deviation noted during stress. The study is normal.  ABIs 01/30/17: normal  Recent Labs: 05/26/2020: ALT 13; BUN 14; Creatinine, Ser 0.76; Hemoglobin 14.0; Platelets 204.0; Potassium 3.9; Sodium 139 10/25/2020: TSH 1.07    Lipid Panel    Component Value Date/Time   CHOL 194 10/25/2020 1109   CHOL 193 09/22/2019 1017   TRIG 140.0 10/25/2020 1109   HDL 60.10 10/25/2020 1109   HDL 54 09/22/2019 1017   CHOLHDL 3 10/25/2020 1109   VLDL 28.0 10/25/2020 1109   LDLCALC 106 (H) 10/25/2020 1109   LDLCALC 103 (H) 09/22/2019 1017   LDLDIRECT 124.0 05/26/2020 1009      Wt Readings from Last 3 Encounters:  04/26/21 234 lb 4.8 oz (106.3 kg)  11/14/20 233 lb (105.7 kg)  10/26/20 230 lb (104.3 kg)      ASSESSMENT AND PLAN: Hypertension, uncontrolled Blood pressure was initially elevated but better on repeat.  Most of her recent blood pressures in the system have been above goal.  However, she has been in a lot of pain.  This is also made it difficult for her to exercise.  She is going to track her blood pressures at home and bring to follow-up.  Continue losartan for now.  Chest pain She continues to have intermittent chest pain.  At times with exertion.  We discussed a  coronary CT-A, but she recalls getting hot and nauseous with contrast dye in the past.  Therefore, she would rather repeat a Lexiscan Myoview.   Current medicines are reviewed at length with the patient today.  The patient does not have concerns regarding medicines.  The following changes have been made:  Labs/ tests ordered today include:   Orders Placed This  Encounter  Procedures   MYOCARDIAL PERFUSION IMAGING   EKG 12-Lead     Disposition:   FU with Audrea Bolte C. Oval Linsey, MD in 1 year FU with APP in 3-4 months  I,Mykaella Javier,acting as a scribe for Skeet Latch, MD.,have documented all relevant documentation on the behalf of Skeet Latch, MD,as directed by  Skeet Latch, MD while in the presence of Skeet Latch, MD.  I, [place] Oval Linsey, MD have reviewed all documentation for this visit.  The documentation of the exam, diagnosis, procedures, and orders on 04/26/2021 are all accurate and complete.   Signed, Skeet Latch, MD  04/26/2021 1:34 PM    [facility]

## 2021-04-26 ENCOUNTER — Ambulatory Visit (INDEPENDENT_AMBULATORY_CARE_PROVIDER_SITE_OTHER): Payer: 59 | Admitting: Cardiovascular Disease

## 2021-04-26 ENCOUNTER — Encounter (HOSPITAL_BASED_OUTPATIENT_CLINIC_OR_DEPARTMENT_OTHER): Payer: Self-pay | Admitting: Cardiovascular Disease

## 2021-04-26 ENCOUNTER — Other Ambulatory Visit: Payer: Self-pay

## 2021-04-26 DIAGNOSIS — I10 Essential (primary) hypertension: Secondary | ICD-10-CM

## 2021-04-26 DIAGNOSIS — R072 Precordial pain: Secondary | ICD-10-CM | POA: Diagnosis not present

## 2021-04-26 NOTE — Assessment & Plan Note (Addendum)
Blood pressure was initially elevated but better on repeat.  Most of her recent blood pressures in the system have been above goal.  However, she has been in a lot of pain.  This is also made it difficult for her to exercise.  She is going to track her blood pressures at home and bring to follow-up.  Continue losartan for now.

## 2021-04-26 NOTE — Assessment & Plan Note (Signed)
She continues to have intermittent chest pain.  At times with exertion.  We discussed a coronary CT-A, but she recalls getting hot and nauseous with contrast dye in the past.  Therefore, she would rather repeat a Lexiscan Myoview.

## 2021-04-26 NOTE — Patient Instructions (Addendum)
Medication Instructions:  Your physician recommends that you continue on your current medications as directed. Please refer to the Current Medication list given to you today.   *If you need a refill on your cardiac medications before your next appointment, please call your pharmacy*  Lab Work: NONE   Testing/Procedures: Your physician has requested that you have a lexiscan myoview. For further information please visit [email or web address] Please follow instruction sheet, as given. THIS WILL BE DONE [facility] [address]   Follow-Up: At [facility], you and your health needs are our priority.  As part of our continuing mission to provide you with exceptional heart care, we have created designated Provider Care Teams.  These Care Teams include your primary Cardiologist (physician) and Advanced Practice Providers (APPs -  Physician Assistants and Nurse Practitioners) who all work together to provide you with the care you need, when you need it.  We recommend signing up for the patient portal called "MyChart".  Sign up information is provided on this After Visit Summary.  MyChart is used to connect with patients for Virtual Visits (Telemedicine).  Patients are able to view lab/test results, encounter notes, upcoming appointments, etc.  Non-urgent messages can be sent to your provider as well.   To learn more about what you can do with MyChart, go to [email or web address].    Your next appointment:   12 month(s)  The format for your next appointment:   In Person  Provider:   Chilton Si, MD  Your physician recommends that you schedule a follow-up appointment in: 2 MONTHS WITH CAITLIN   Other Instructions MONITOR AND LOG YOUR BLOOD PRESSURE AT HOME BRING READINGS AND MACHINE TO FOLLOW UP

## 2021-04-27 ENCOUNTER — Other Ambulatory Visit: Payer: Self-pay | Admitting: Cardiovascular Disease

## 2021-04-27 ENCOUNTER — Other Ambulatory Visit: Payer: Self-pay | Admitting: Internal Medicine

## 2021-04-27 NOTE — Telephone Encounter (Signed)
Refill ordered

## 2021-04-30 ENCOUNTER — Telehealth (HOSPITAL_COMMUNITY): Payer: Self-pay | Admitting: Cardiovascular Disease

## 2021-04-30 NOTE — Telephone Encounter (Signed)
Patient called and cancelled Myoview for reason below:  04/30/2021 8:17 AM TM:[id number], SHAWANA A  Cancel Rsn: Patient (appt conflict)  Order will be removed from the WQ and when patient calls back to reschedule we will reinstate the order.

## 2021-05-09 ENCOUNTER — Ambulatory Visit (HOSPITAL_COMMUNITY): Payer: 59

## 2021-05-10 ENCOUNTER — Ambulatory Visit (HOSPITAL_COMMUNITY): Payer: 59

## 2021-05-10 ENCOUNTER — Telehealth: Payer: Self-pay | Admitting: Internal Medicine

## 2021-05-10 ENCOUNTER — Other Ambulatory Visit: Payer: Self-pay | Admitting: Internal Medicine

## 2021-05-10 DIAGNOSIS — E099 Drug or chemical induced diabetes mellitus without complications: Secondary | ICD-10-CM

## 2021-05-10 DIAGNOSIS — E538 Deficiency of other specified B group vitamins: Secondary | ICD-10-CM

## 2021-05-10 DIAGNOSIS — I10 Essential (primary) hypertension: Secondary | ICD-10-CM

## 2021-05-10 DIAGNOSIS — E559 Vitamin D deficiency, unspecified: Secondary | ICD-10-CM

## 2021-05-10 DIAGNOSIS — D509 Iron deficiency anemia, unspecified: Secondary | ICD-10-CM

## 2021-05-10 NOTE — Telephone Encounter (Signed)
Patient coming in for CPE 12/09  Would like to have labs done before visit.Marland Kitchenplease place lab order & let patient know when this has been done

## 2021-05-10 NOTE — Telephone Encounter (Signed)
Ok labs ordered  Ok to let pt know - can be done at the [facility] lab without appt if she likes

## 2021-05-11 NOTE — Telephone Encounter (Signed)
Patient notified

## 2021-05-28 ENCOUNTER — Other Ambulatory Visit (INDEPENDENT_AMBULATORY_CARE_PROVIDER_SITE_OTHER): Payer: 59

## 2021-05-28 DIAGNOSIS — E559 Vitamin D deficiency, unspecified: Secondary | ICD-10-CM

## 2021-05-28 DIAGNOSIS — E099 Drug or chemical induced diabetes mellitus without complications: Secondary | ICD-10-CM

## 2021-05-28 DIAGNOSIS — D509 Iron deficiency anemia, unspecified: Secondary | ICD-10-CM

## 2021-05-28 DIAGNOSIS — E538 Deficiency of other specified B group vitamins: Secondary | ICD-10-CM | POA: Diagnosis not present

## 2021-05-28 DIAGNOSIS — T380X5S Adverse effect of glucocorticoids and synthetic analogues, sequela: Secondary | ICD-10-CM

## 2021-05-28 LAB — LIPID PANEL
Cholesterol: 192 mg/dL (ref 0–200)
HDL: 65.4 mg/dL (ref >39.00)
LDL Cholesterol: 100 mg/dL — ABNORMAL HIGH (ref 0–99)
NonHDL: 126.38
Total CHOL/HDL Ratio: 3
Triglycerides: 133 mg/dL (ref 0.0–149.0)
VLDL: 26.6 mg/dL (ref 0.0–40.0)

## 2021-05-28 LAB — CBC WITH DIFFERENTIAL/PLATELET
Basophils Absolute: 0 10*3/uL (ref 0.0–0.1)
Basophils Relative: 0.7 % (ref 0.0–3.0)
Eosinophils Absolute: 0.1 10*3/uL (ref 0.0–0.7)
Eosinophils Relative: 2.1 % (ref 0.0–5.0)
HCT: 41.9 % (ref 36.0–46.0)
Hemoglobin: 13.9 g/dL (ref 12.0–15.0)
Lymphocytes Relative: 39.5 % (ref 12.0–46.0)
Lymphs Abs: 2.6 10*3/uL (ref 0.7–4.0)
MCHC: 33.1 g/dL (ref 30.0–36.0)
MCV: 88.9 fl (ref 78.0–100.0)
Monocytes Absolute: 0.4 10*3/uL (ref 0.1–1.0)
Monocytes Relative: 6.5 % (ref 3.0–12.0)
Neutro Abs: 3.4 10*3/uL (ref 1.4–7.7)
Neutrophils Relative %: 51.2 % (ref 43.0–77.0)
Platelets: 203 10*3/uL (ref 150.0–400.0)
RBC: 4.71 Mil/uL (ref 3.87–5.11)
RDW: 13.4 % (ref 11.5–15.5)
WBC: 6.7 10*3/uL (ref 4.0–10.5)

## 2021-05-28 LAB — BASIC METABOLIC PANEL
BUN: 12 mg/dL (ref 6–23)
CO2: 26 mEq/L (ref 19–32)
Calcium: 9.8 mg/dL (ref 8.4–10.5)
Chloride: 104 mEq/L (ref 96–112)
Creatinine, Ser: 0.93 mg/dL (ref 0.40–1.20)
GFR: 66.26 mL/min (ref >60.00)
Glucose, Bld: 132 mg/dL — ABNORMAL HIGH (ref 70–99)
Potassium: 4 mEq/L (ref 3.5–5.1)
Sodium: 141 mEq/L (ref 135–145)

## 2021-05-28 LAB — TSH: TSH: 0.7 u[IU]/mL (ref 0.35–5.50)

## 2021-05-28 LAB — VITAMIN D 25 HYDROXY (VIT D DEFICIENCY, FRACTURES): VITD: 23.95 ng/mL — ABNORMAL LOW (ref 30.00–100.00)

## 2021-05-28 LAB — URINALYSIS, ROUTINE W REFLEX MICROSCOPIC
Bilirubin Urine: NEGATIVE
Hgb urine dipstick: NEGATIVE
Ketones, ur: NEGATIVE
Nitrite: NEGATIVE
RBC / HPF: NONE SEEN (ref 0–?)
Specific Gravity, Urine: 1.02 (ref 1.000–1.030)
Total Protein, Urine: NEGATIVE
Urine Glucose: NEGATIVE
Urobilinogen, UA: 0.2 (ref 0.0–1.0)
pH: 6 (ref 5.0–8.0)

## 2021-05-28 LAB — HEPATIC FUNCTION PANEL
ALT: 22 U/L (ref 0–35)
AST: 19 U/L (ref 0–37)
Albumin: 4.3 g/dL (ref 3.5–5.2)
Alkaline Phosphatase: 53 U/L (ref 39–117)
Bilirubin, Direct: 0.1 mg/dL (ref 0.0–0.3)
Total Bilirubin: 0.5 mg/dL (ref 0.2–1.2)
Total Protein: 7.3 g/dL (ref 6.0–8.3)

## 2021-05-28 LAB — IBC PANEL
Iron: 68 ug/dL (ref 42–145)
Saturation Ratios: 13.8 % — ABNORMAL LOW (ref 20.0–50.0)
TIBC: 492.8 ug/dL — ABNORMAL HIGH (ref 250.0–450.0)
Transferrin: 352 mg/dL (ref 212.0–360.0)

## 2021-05-28 LAB — MICROALBUMIN / CREATININE URINE RATIO
Creatinine,U: 111.8 mg/dL
Microalb Creat Ratio: 0.6 mg/g (ref 0.0–30.0)
Microalb, Ur: <0.7 mg/dL (ref 0.0–1.9)

## 2021-05-28 LAB — FERRITIN: Ferritin: 57.6 ng/mL (ref 10.0–291.0)

## 2021-05-28 LAB — HEMOGLOBIN A1C: Hgb A1c MFr Bld: 7.1 % — ABNORMAL HIGH (ref 4.6–6.5)

## 2021-05-28 LAB — VITAMIN B12: Vitamin B-12: 282 pg/mL (ref 211–911)

## 2021-06-01 ENCOUNTER — Ambulatory Visit (INDEPENDENT_AMBULATORY_CARE_PROVIDER_SITE_OTHER): Payer: 59 | Admitting: Internal Medicine

## 2021-06-01 ENCOUNTER — Other Ambulatory Visit: Payer: Self-pay

## 2021-06-01 VITALS — BP 126/78 | HR 70 | Ht 65.0 in | Wt 226.0 lb

## 2021-06-01 DIAGNOSIS — R739 Hyperglycemia, unspecified: Secondary | ICD-10-CM

## 2021-06-01 DIAGNOSIS — E538 Deficiency of other specified B group vitamins: Secondary | ICD-10-CM | POA: Diagnosis not present

## 2021-06-01 DIAGNOSIS — I10 Essential (primary) hypertension: Secondary | ICD-10-CM

## 2021-06-01 DIAGNOSIS — E785 Hyperlipidemia, unspecified: Secondary | ICD-10-CM

## 2021-06-01 DIAGNOSIS — Z0001 Encounter for general adult medical examination with abnormal findings: Secondary | ICD-10-CM | POA: Diagnosis not present

## 2021-06-01 DIAGNOSIS — E559 Vitamin D deficiency, unspecified: Secondary | ICD-10-CM

## 2021-06-01 DIAGNOSIS — E781 Pure hyperglyceridemia: Secondary | ICD-10-CM

## 2021-06-01 MED ORDER — OZEMPIC (0.25 OR 0.5 MG/DOSE) 2 MG/1.5ML ~~LOC~~ SOPN: 0.5000 mg | PEN_INJECTOR | SUBCUTANEOUS | 3 refills | Status: DC

## 2021-06-01 MED ORDER — ROSUVASTATIN CALCIUM 10 MG PO TABS: 10.0000 mg | ORAL_TABLET | Freq: Every day | ORAL | 3 refills | Status: DC

## 2021-06-01 NOTE — Assessment & Plan Note (Signed)
Last vitamin D Lab Results  Component Value Date   VD25OH 23.95 (L) 05/28/2021   Low, to start oral replacement  

## 2021-06-01 NOTE — Progress Notes (Signed)
Patient ID: [name], female   DOB: 1961-02-24, 60 y.o.   MRN: [id number]         Chief Complaint:: wellness exam and low vit d, dm, hold       HPI:  [name] is a 60 y.o. female here for wellness exam; up to date with immunizations, and reventive referrals.                        Also worsening lower back and right > left hip pain worsening recenlty, followed per pain management, now on increased PPI to 40 mg per GI to reduce risk of nsaid bleed.  Not taking Vit d.  Willing to try ozempic for DM and wt los, and crestor for LDL over goal.  Pt denies chest pain, increased sob or doe, wheezing, orthopnea, PND, increased LE swelling, palpitations, dizziness or syncope.   Pt denies polydipsia, polyuria, or new focal neuro s/s.   Pt denies fever, wt loss, night sweats, loss of appetite, or other constitutional symptoms   No other new compaints Wt Readings from Last 3 Encounters:  06/01/21 226 lb (102.5 kg)  04/26/21 234 lb 4.8 oz (106.3 kg)  11/14/20 233 lb (105.7 kg)   BP Readings from Last 3 Encounters:  06/01/21 126/78  04/26/21 120/70  11/14/20 (!) 146/90   Immunization History  Administered Date(s) Administered   Influenza Split 03/28/2011, 04/02/2012   Influenza Whole 04/04/2010   Influenza,inj,Quad PF,6+ Mos 03/19/2013, 04/01/2014, 04/01/2014, 03/24/2015, 03/05/2017, 04/09/2018, 03/31/2019, 03/10/2020, 03/08/2021   Influenza-Unspecified 03/18/2016   Moderna Sars-Covid-2 Vaccination 09/02/2019, 10/05/2019   Pneumococcal Conjugate-13 05/25/2019   Pneumococcal Polysaccharide-23 05/31/2020   Td 01/16/2009   Tdap 03/05/2017   Zoster Recombinat (Shingrix) 05/31/2020, 08/10/2020   There are no preventive care reminders to display for this patient.     Past Medical History:  Diagnosis Date   Anemia    ANEMIA-IRON DEFICIENCY 01/03/2007   Anxiety    ANXIETY 01/03/2007   Complication of anesthesia    severe post-operative nausea except for bladder surgery in 2002 at  [facility]-did not have complications then   Depression    DEPRESSION 01/03/2007   DJD (degenerative joint disease)    knee   Enlarged thyroid 10/18/2019   Family history of adverse reaction to anesthesia    mother and father both had severe post-operative nausea and vomiting   GERD 01/16/2009   GERD (gastroesophageal reflux disease)    Headache(784.0) 01/03/2007   Hyperplastic colon polyp    04/2010   HYPERSOMNIA 04/04/2010   Hypertension    IBS 01/16/2009   IBS (irritable bowel syndrome)    Impaired glucose tolerance 05/18/2011   Menopause    early   Migraine    Obesity    OSTEOARTHRITIS, KNEES, 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 She has never used smokeless tobacco. She reports that she does not drink alcohol and does not use drugs. family history includes Diabetes in her brother; Hypertension in her mother. Allergies  Allergen Reactions   Meperidine Shortness Of Breath   Morphine Anaphylaxis   Penicillins Hives, Nausea Only, Swelling, Other (See Comments), Anaphylaxis  and Nausea And Vomiting    PATIENT HAS HAD A PCN REACTION WITH IMMEDIATE RASH, FACIAL/TONGUE/THROAT SWELLING, SOB, OR LIGHTHEADEDNESS WITH HYPOTENSION:  #  #  YES  #  #  HAS PT DEVELOPED SEVERE RASH INVOLVING MUCUS MEMBRANES or SKIN NECROSIS: #  #  YES  #  #  PATIENT HAS HAD A PCN REACTION THAT REQUIRED HOSPITALIZATION:  #  #  YES  #  #  Has patient had a PCN reaction occurring within the last 10 years: No  vomiting PATIENT HAS HAD A PCN REACTION WITH IMMEDIATE RASH, FACIAL/TONGUE/THROAT SWELLING, SOB, OR LIGHTHEADEDNESS WITH HYPOTENSION:  #  #  YES  #  #  HAS PT DEVELOPED SEVERE RASH INVOLVING MUCUS MEMBRANES or SKIN NECROSIS: #  #  YES  #   #  PATIENT HAS HAD A PCN REACTION THAT REQUIRED HOSPITALIZATION:  #  #  YES  #  #  Has patient had a PCN reaction occurring within the last 10 years: No   Azithromycin Nausea And Vomiting and Nausea Only   Lisinopril Cough        Sucralfate Rash and Other (See Comments)    Blurry vision  Blurry vision     Doxycycline Nausea Only    nausea   Gabapentin Other (See Comments)    Muscle pain, fatigue   Other    Tyloxapol Other (See Comments)   Amoxicillin Nausea Only   Codeine Nausea Only and Other (See Comments)    Hot flashes also   Hydrocodone Itching, Nausea Only and Rash   Omeprazole Itching and Rash   Oxycodone Nausea Only, Rash and Nausea And Vomiting   Oxycodone-Acetaminophen Nausea And Vomiting and Rash   Tramadol Itching, Nausea Only and Rash   Current Outpatient Medications on File Prior to Visit  Medication Sig Dispense Refill   acetaminophen (TYLENOL) 500 MG tablet Take 1,000 mg by mouth daily as needed for moderate pain or headache.     Cholecalciferol (THERA-D 2000) 50 MCG (2000 UT) TABS 1 tab by mouth once daily 30 tablet 99   esomeprazole (NEXIUM) 20 MG capsule Take 20 mg by mouth daily.      estradiol (ESTRACE) 0.5 MG tablet Take 0.5 mg by mouth daily.     gabapentin (NEURONTIN) 100 MG capsule Take 2 capsules (200 mg total) by mouth 3 (three) times daily as needed. 180 capsule 5   hydrocortisone 2.5 % cream hydrocortisone 2.5 % topical cream     ketoconazole (NIZORAL) 2 % cream ketoconazole 2 % topical cream     losartan (COZAAR) 50 MG tablet TAKE 1 TABLET BY MOUTH EVERY DAY 30 tablet 8   Probiotic CAPS Take 1 capsule by mouth daily.     promethazine (PHENERGAN) 25 MG tablet Take 25 mg by mouth daily as needed for nausea or vomiting.     cetirizine (ZYRTEC) 10 MG tablet Take 1 tablet (10 mg total) by mouth daily. 30 tablet 11   No current facility-administered medications on file prior to visit.        ROS:  All others reviewed and negative.  Objective         PE:  BP 126/78   Pulse 70   Ht 5\' 5"  (1.651 m)   Wt 226 lb (102.5 kg)   SpO2 98%   BMI 37.61 kg/m                 Constitutional: Pt appears in NAD  HENT: Head: NCAT.                Right Ear: External ear normal.                 Left Ear: External ear normal.                Eyes: . Pupils are equal, round, and reactive to light. Conjunctivae and EOM are normal               Nose: without d/c or deformity               Neck: Neck supple. Gross normal ROM               Cardiovascular: Normal rate and regular rhythm.                 Pulmonary/Chest: Effort normal and breath sounds without rales or wheezing.                Abd:  Soft, NT, ND, + BS, no organomegaly               Neurological: Pt is alert. At baseline orientation, motor grossly intact               Skin: Skin is warm. No rashes, no other new lesions, LE edema - none               Psychiatric: Pt behavior is normal without agitation   Micro: none  Cardiac tracings I have personally interpreted today:  none  Pertinent Radiological findings (summarize): none   Lab Results  Component Value Date   WBC 6.7 05/28/2021   HGB 13.9 05/28/2021   HCT 41.9 05/28/2021   PLT 203.0 05/28/2021   GLUCOSE 132 (H) 05/28/2021   CHOL 192 05/28/2021   TRIG 133.0 05/28/2021   HDL 65.40 05/28/2021   LDLDIRECT 124.0 05/26/2020   LDLCALC 100 (H) 05/28/2021   ALT 22 05/28/2021   AST 19 05/28/2021   NA 141 05/28/2021   K 4.0 05/28/2021   CL 104 05/28/2021   CREATININE 0.93 05/28/2021   BUN 12 05/28/2021   CO2 26 05/28/2021   TSH 0.70 05/28/2021   HGBA1C 7.1 (H) 05/28/2021   MICROALBUR <0.7 05/28/2021   Assessment/Plan:  [name] is a 60 y.o. White or Caucasian [1] female with  has a past medical history of Anemia, ANEMIA-IRON DEFICIENCY (01/03/2007), Anxiety, ANXIETY (01/03/2007), Complication of anesthesia, Depression, DEPRESSION (01/03/2007), DJD (degenerative joint disease), Enlarged thyroid (10/18/2019),  Family history of adverse reaction to anesthesia, GERD (01/16/2009), GERD (gastroesophageal reflux disease), Headache(784.0) (01/03/2007), Hyperplastic colon polyp, HYPERSOMNIA (04/04/2010), Hypertension, IBS (01/16/2009), IBS (irritable bowel syndrome), Impaired glucose tolerance (05/18/2011), Menopause, Migraine, Obesity, OSTEOARTHRITIS, KNEES, 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  Vitamin D deficiency Last vitamin D Lab Results  Component Value Date   VD25OH 23.95 (L) 05/28/2021   Low, to start oral replacement   Encounter for well adult exam with abnormal findings Age and sex appropriate education and counseling updated with regular exercise and diet Referrals for preventative services - none needed Immunizations addressed - none needed Smoking counseling  - none needed Evidence for depression or other mood disorder - none significant Most recent labs reviewed. I have personally reviewed and have noted: 1) the patient's medical and social history 2) The patient's current medications and supplements 3) The patient's height, weight, and BMI have been recorded in the chart  Hypertension, uncontrolled BP Readings from Last 3 Encounters:  06/01/21 126/78  04/26/21 120/70  11/14/20 (!) 146/90   Stable, pt to continue medical treatment losartan   Hyperglycemia Lab Results  Component Value Date   HGBA1C 7.1 (H) 05/28/2021   Uncontrolled, goal a1c < 7, pt to add start ozempic for wt loss and sugar control, cont diet   Dyslipidemia Lab Results  Component Value Date   LDLCALC 100 (H) 05/28/2021   Uncontroled, goal ldl <70,, pt to start crestor 10 qd, cont DM diet Followup: Return in about 6 months (around 11/30/2021).  Oliver Barre, MD 06/03/2021 7:45 AM [facility] - [facility]

## 2021-06-01 NOTE — Patient Instructions (Addendum)
Please take OTC Vitamin D3 at 3000 units per day, indefinitely  Please take all new medication as prescribed - the ozempic for sugar, and crestor for cholesterol  Please continue all other medications as before, and refills have been done if requested.  Please have the pharmacy call with any other refills you may need.  Please continue your efforts at being more active, low cholesterol diet, and weight control.  You are otherwise up to date with prevention measures today.  Please keep your appointments with your specialists as you may have planned  Please make an Appointment to return in 6 months, or sooner if needed, also with Lab Appointment for testing done 3-5 days before at the FIRST FLOOR Lab (so this is for TWO appointments - please see the scheduling desk as you leave)  Due to the ongoing Covid 19 pandemic, our lab now requires an appointment for any labs done at our office.  If you need labs done and do not have an appointment, please call our office ahead of time to schedule before presenting to the lab for your testing.

## 2021-06-03 ENCOUNTER — Encounter: Payer: Self-pay | Admitting: Internal Medicine

## 2021-06-03 NOTE — Assessment & Plan Note (Signed)
Lab Results  Component Value Date   LDLCALC 100 (H) 05/28/2021   Uncontroled, goal ldl <70,, pt to start crestor 10 qd, cont DM diet

## 2021-06-03 NOTE — Assessment & Plan Note (Signed)

## 2021-06-03 NOTE — Assessment & Plan Note (Signed)
BP Readings from Last 3 Encounters:  06/01/21 126/78  04/26/21 120/70  11/14/20 (!) 146/90   Stable, pt to continue medical treatment losartan

## 2021-06-03 NOTE — Assessment & Plan Note (Signed)
Lab Results  Component Value Date   HGBA1C 7.1 (H) 05/28/2021   Uncontrolled, goal a1c < 7, pt to add start ozempic for wt loss and sugar control, cont diet

## 2021-06-05 ENCOUNTER — Telehealth: Payer: 59 | Admitting: Internal Medicine

## 2021-06-05 ENCOUNTER — Telehealth: Payer: Self-pay

## 2021-06-05 ENCOUNTER — Encounter: Payer: Self-pay | Admitting: Family

## 2021-06-05 ENCOUNTER — Telehealth (INDEPENDENT_AMBULATORY_CARE_PROVIDER_SITE_OTHER): Payer: 59 | Admitting: Family

## 2021-06-05 VITALS — Ht 65.0 in | Wt 225.0 lb

## 2021-06-05 DIAGNOSIS — R6889 Other general symptoms and signs: Secondary | ICD-10-CM

## 2021-06-05 MED ORDER — ONDANSETRON HCL 8 MG PO TABS: 8.0000 mg | ORAL_TABLET | Freq: Three times a day (TID) | ORAL | 0 refills | Status: DC | PRN

## 2021-06-05 MED ORDER — OSELTAMIVIR PHOSPHATE 75 MG PO CAPS: 75.0000 mg | ORAL_CAPSULE | Freq: Two times a day (BID) | ORAL | 0 refills | Status: DC

## 2021-06-05 NOTE — Progress Notes (Signed)
ZALEAH TERNES is a 60 y.o. female with the following history as recorded in EpicCare:  Patient Active Problem List   Diagnosis Date Noted   COVID-19 virus infection 02/22/2021   Fatigue 10/11/2020   Occipital neuralgia of left side 09/14/2020   Postlaminectomy syndrome 09/14/2020   SI (sacroiliac) pain 09/14/2020   URI (upper respiratory infection) 08/20/2020   Thrush 08/20/2020   History of laparoscopic-assisted vaginal hysterectomy 08/17/2020   History of angioedema 08/17/2020   Obesity 08/17/2020   Visual impairment 11/09/2019   Rash 11/05/2019   Goiter 10/18/2019   Tenosynovitis of thumb 09/22/2019   Angioedema 04/21/2019   Blurred vision 05/20/2018   Allergic rhinitis 05/20/2018   Degenerative spondylolisthesis 03/03/2018   Preop exam for internal medicine 02/18/2018   Shingles 02/09/2018   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   Tinea cruris 01/05/2018   Contact dermatitis 12/24/2017   Surgical menopause 03/05/2017   Vitamin D deficiency 03/05/2017   Dizziness 06/22/2016   Nausea without vomiting 06/22/2016   Hyperglycemia 02/29/2016   Dyslipidemia 02/29/2016   Dyspnea on exertion 04/15/2015   Hypertension, uncontrolled 04/10/2015   Delayed gastric emptying 12/16/2012   Chronic pain 05/08/2012   Muscle spasm of back 05/08/2012   Chest pain 05/08/2012   Gastroparesis 05/22/2011   Encounter for well adult exam with abnormal findings 05/18/2011   HYPERSOMNIA 04/04/2010   GERD 01/16/2009   IBS 01/16/2009   Osteoarthritis 01/16/2009   ANEMIA-IRON DEFICIENCY 01/03/2007   Anxiety state 01/03/2007   DEPRESSION 01/03/2007    Current Outpatient Medications  Medication Sig Dispense Refill   acetaminophen (TYLENOL) 500 MG tablet Take 1,000 mg by mouth daily as needed for moderate pain or headache.     cetirizine (ZYRTEC) 10 MG tablet Take 1 tablet (10 mg total) by mouth daily. 30 tablet 11   Cholecalciferol (THERA-D 2000) 50 MCG (2000 UT) TABS 1 tab by mouth  once daily 30 tablet 99   esomeprazole (NEXIUM) 20 MG capsule Take 20 mg by mouth daily.      estradiol (ESTRACE) 0.5 MG tablet Take 0.5 mg by mouth daily.     gabapentin (NEURONTIN) 100 MG capsule Take 2 capsules (200 mg total) by mouth 3 (three) times daily as needed. 180 capsule 5   hydrocortisone 2.5 % cream hydrocortisone 2.5 % topical cream     ketoconazole (NIZORAL) 2 % cream ketoconazole 2 % topical cream     losartan (COZAAR) 50 MG tablet TAKE 1 TABLET BY MOUTH EVERY DAY 30 tablet 8   ondansetron (ZOFRAN) 8 MG tablet Take 1 tablet (8 mg total) by mouth every 8 (eight) hours as needed for nausea or vomiting. 20 tablet 0   oseltamivir (TAMIFLU) 75 MG capsule Take 1 capsule (75 mg total) by mouth 2 (two) times daily. 10 capsule 0   Probiotic CAPS Take 1 capsule by mouth daily.     promethazine (PHENERGAN) 25 MG tablet Take 25 mg by mouth daily as needed for nausea or vomiting.     rosuvastatin (CRESTOR) 10 MG tablet Take 1 tablet (10 mg total) by mouth daily. 90 tablet 3   Semaglutide,0.25 or 0.5MG /DOS, (OZEMPIC, 0.25 OR 0.5 MG/DOSE,) 2 MG/1.5ML SOPN Inject 0.5 mg into the skin once a week. (Patient not taking: Reported on 06/05/2021) 6 mL 3   No current facility-administered medications for this visit.    Allergies: Meperidine, Morphine, Penicillins, Azithromycin, Lisinopril, Sucralfate, Doxycycline, Gabapentin, Other, Tyloxapol, Amoxicillin, Codeine, Hydrocodone, Omeprazole, Oxycodone, Oxycodone-acetaminophen, and Tramadol  Past Medical History:  Diagnosis Date  Anemia    ANEMIA-IRON DEFICIENCY 01/03/2007   Anxiety    ANXIETY 01/03/2007   Complication of anesthesia    severe post-operative nausea except for bladder surgery in 2002 at [facility]-did not have complications then   Depression    DEPRESSION 01/03/2007   DJD (degenerative joint disease)    knee   Enlarged thyroid 10/18/2019   Family history of adverse reaction to anesthesia    mother and father both had severe  post-operative nausea and vomiting   GERD 01/16/2009   GERD (gastroesophageal reflux disease)    Headache(784.0) 01/03/2007   Hyperplastic colon polyp    04/2010   HYPERSOMNIA 04/04/2010   Hypertension    IBS 01/16/2009   IBS (irritable bowel syndrome)    Impaired glucose tolerance 05/18/2011   Menopause    early   Migraine    Obesity    OSTEOARTHRITIS, KNEES, 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   PONV (postoperative nausea and vomiting)    Wheezing 07/02/2010    Past Surgical History:  Procedure Laterality Date   ABDOMINAL HYSTERECTOMY     APPENDECTOMY     bladder tac     CHOLECYSTECTOMY     KNEE ARTHROSCOPY Bilateral    OOPHORECTOMY      Family History  Problem Relation Age of Onset   Hypertension Mother    Diabetes Brother     Social History   Tobacco Use   Smoking status: Never   Smokeless tobacco: Never  Substance Use Topics   Alcohol use: No    Alcohol/week: 0.0 standard drinks    Subjective:     I connected with [name] on 06/05/21 at  3:40 PM EST by a telephone call and verified that I am speaking with the correct person using two identifiers.   I discussed the limitations of evaluation and management by telemedicine and the availability of in person appointments. The patient expressed understanding and agreed to proceed. Provider in office/ patient is at home; provider and patient are only 2 people on telephone call.   Patient complaining of flu like symptoms x 2 days; notes that grandson has been diagnosed with Flu type A; patient has had close exposure; has been having nausea/ vomiting; finally able to keep fluids down some today; + headache/ "feel hot."    Objective:  Vitals:   06/05/21 1554  Weight: 225 lb (102.1 kg)  Height: 5\' 5"  (1.651 m)    Lungs: Respirations unlabored;  Neurologic: Alert and oriented; speech intact;   Assessment:  1. Flu-like symptoms     Plan:  Start Tamiflu 75 mg bid x 5 days- she understands to start today as she is in the  48 hour window; Rx for Zofran 8 mg q 8 hours nausea; increase fluids, rest and follow up worse, no better; will need to be seen in person if symptoms persist, worsen.   Time spent 10 minutes  No follow-ups on file.  No orders of the defined types were placed in this encounter.   Requested Prescriptions   Signed Prescriptions Disp Refills   oseltamivir (TAMIFLU) 75 MG capsule 10 capsule 0    Sig: Take 1 capsule (75 mg total) by mouth 2 (two) times daily.   ondansetron (ZOFRAN) 8 MG tablet 20 tablet 0    Sig: Take 1 tablet (8 mg total) by mouth every 8 (eight) hours as needed for nausea or vomiting.

## 2021-06-05 NOTE — Telephone Encounter (Signed)
Pt states she has been exposed to the flu via her grandson and is have cough, body aches and not feeling good overall.  Pt wanted me to inform Dr. Jonny Ruiz and his nurse.  **I was able to sched pt a vv with Ria Clock at 3:40pm.

## 2021-06-06 MED ORDER — BENZONATATE 100 MG PO CAPS: 100.0000 mg | ORAL_CAPSULE | Freq: Three times a day (TID) | ORAL | 1 refills | Status: DC | PRN

## 2021-06-06 NOTE — Telephone Encounter (Signed)
Patient notified

## 2021-06-06 NOTE — Addendum Note (Signed)
Addended by: Corwin Levins on: 06/06/2021 09:57 AM   Modules accepted: Orders

## 2021-06-06 NOTE — Telephone Encounter (Signed)
Ok for [employer] done erx

## 2021-06-06 NOTE — Telephone Encounter (Signed)
Patient states flu symptoms are progressing  Patient was prescribed oseltamivir (TAMIFLU) 75 MG capsule on 06-05-2021  Patient requesting another rx for cough and congestion

## 2021-06-11 ENCOUNTER — Telehealth: Payer: Self-pay | Admitting: Internal Medicine

## 2021-06-11 MED ORDER — NYSTATIN 100000 UNIT/ML MT SUSP: 5.0000 mL | Freq: Four times a day (QID) | OROMUCOSAL | 0 refills | Status: DC | PRN

## 2021-06-11 NOTE — Telephone Encounter (Signed)
Done erx 

## 2021-06-11 NOTE — Telephone Encounter (Signed)
Patient states she has thrush and is requesting a rx for magic mouthwash  Offered patient an appt, patient declined   Pharmacy  [facility] [phone] - [place], [place] - [address]

## 2021-06-27 ENCOUNTER — Ambulatory Visit (HOSPITAL_BASED_OUTPATIENT_CLINIC_OR_DEPARTMENT_OTHER): Payer: 59 | Admitting: Family

## 2021-07-18 ENCOUNTER — Telehealth: Payer: Self-pay | Admitting: Internal Medicine

## 2021-07-18 ENCOUNTER — Telehealth (HOSPITAL_BASED_OUTPATIENT_CLINIC_OR_DEPARTMENT_OTHER): Payer: Self-pay | Admitting: Cardiovascular Disease

## 2021-07-18 DIAGNOSIS — I10 Essential (primary) hypertension: Secondary | ICD-10-CM

## 2021-07-18 MED ORDER — RYBELSUS 3 MG PO TABS: 3.0000 mg | ORAL_TABLET | Freq: Every day | ORAL | 3 refills | Status: DC

## 2021-07-18 MED ORDER — LOSARTAN POTASSIUM 100 MG PO TABS: 100.0000 mg | ORAL_TABLET | Freq: Every day | ORAL | 6 refills | Status: DC

## 2021-07-18 NOTE — Telephone Encounter (Signed)
Ok to increase losartan from 50mg  to 100mg  daily for additional blood pressure control. Pt should continue to monitor BP at home and should have repeat BMET in the next 2-3 weeks to ensure stable renal function and electrolytes.

## 2021-07-18 NOTE — Telephone Encounter (Signed)
Ok to try change to rybelsus oral - done erx

## 2021-07-18 NOTE — Telephone Encounter (Signed)
New Message:     Patient says her blood pressure is still running high. She said Dr Duke Salvia had discussed increasing her Losartan. Shee said she would like for her to go ahead and increase the Losartan.   Pt c/o BP issue: STAT if pt c/o blurred vision, one-sided weakness or slurred speech  1. What are your last 5 BPigh readings? 132/79, 156/87. 138/84, 150/82  2. Are you having any other symptoms (ex. Dizziness, headache, blurred vision, passed out)?  Headaches, little dizziness  3. What is your BP issue? Blood pressure riding

## 2021-07-18 NOTE — Telephone Encounter (Signed)
Patient calling in  Patient says the insulin Semaglutide,0.25 or 0.5MG /DOS, (OZEMPIC, 0.25 OR 0.5 MG/DOSE,) 2 MG/1.5ML SOPN provider prescribed her in 05/2021 she can not take due to her thyroid issues (also was not covered by insurance)  Patient wants to know if there is an alternative.. call back [phone]

## 2021-07-18 NOTE — Telephone Encounter (Signed)
Patient calling back to follow up.

## 2021-07-18 NOTE — Telephone Encounter (Signed)
Returned call to patient, prescription updated and lab slips placed in the mail for patient!   "Ok to increase losartan from 50mg  to 100mg  daily for additional blood pressure control. Pt should continue to monitor BP at home and should have repeat BMET in the next 2-3 weeks to ensure stable renal function and electrolytes."

## 2021-07-18 NOTE — Telephone Encounter (Signed)
Dr. Duke Salvia is out of office, could you recommend a BP medication adjustment please?

## 2021-07-18 NOTE — Telephone Encounter (Signed)
Patient notified of new medication

## 2021-07-31 ENCOUNTER — Telehealth (HOSPITAL_BASED_OUTPATIENT_CLINIC_OR_DEPARTMENT_OTHER): Payer: Self-pay | Admitting: Cardiovascular Disease

## 2021-07-31 NOTE — Telephone Encounter (Addendum)
Called patient back about her message. Patient stated her BP is still elevated since taking losartan 100 mg daily. Patient reports her HR has been in normal range. Patient has been taking BP at random times and she takes her losartan at random times as well. Encouraged patient to take losartan same time each day and take BP readings 2 hours after taking medication. Will send message to Dr. Oval Linsey for further advisement.

## 2021-07-31 NOTE — Telephone Encounter (Signed)
New Message:     Patient said her Losartan was increased about 2 weeks ago, blood pressure is still high.   Pt c/o BP issue: STAT if pt c/o blurred vision, one-sided weakness or slurred speech  1. What are your last 5 BP readings? 176/84, 132/103  152/85 155/98, 144/79  2. Are you having any other symptoms (ex. Dizziness, headache, blurred vision, passed out)? no  3. What is your BP issue? Blood pressure is still running high

## 2021-08-01 MED ORDER — HYDROCHLOROTHIAZIDE 25 MG PO TABS: 25.0000 mg | ORAL_TABLET | Freq: Every day | ORAL | 3 refills | Status: DC

## 2021-08-01 NOTE — Telephone Encounter (Signed)
Chilton Si, MD to [facility], Rinaldo Cloud, RN  Me     10:25 AM Add HCTZ 25 mg daily.  Check BMP in a week.  Follow-up with Pharm.D., APP, or me in 2 to 4 weeks.   Advised patient, verbalized understanding Will keep follow up as scheduled

## 2021-08-03 ENCOUNTER — Telehealth (HOSPITAL_BASED_OUTPATIENT_CLINIC_OR_DEPARTMENT_OTHER): Payer: Self-pay | Admitting: Cardiovascular Disease

## 2021-08-03 ENCOUNTER — Telehealth (HOSPITAL_BASED_OUTPATIENT_CLINIC_OR_DEPARTMENT_OTHER): Payer: Self-pay

## 2021-08-03 MED ORDER — LOSARTAN POTASSIUM 100 MG PO TABS: 50.0000 mg | ORAL_TABLET | Freq: Every day | ORAL | 6 refills | Status: DC

## 2021-08-03 NOTE — Telephone Encounter (Signed)
Entered in Error

## 2021-08-03 NOTE — Telephone Encounter (Signed)
Returned call to patient to go over Dr. Leonides Sake recommendations!    OK to reduce the losartan to 50mg .  Please start the HCTZ that was prescribed.   TCR   Recommendations given, patient states she can break her 100mg  tablets in half to take 50mg ! No refills sent at this time!

## 2021-08-03 NOTE — Telephone Encounter (Signed)
Patient called in reporting blurry vision. Patient cannot recall exactly when it started because "she has been busy", but states she really noticed it yesterday. Patient believes is to be related to her Losartan increased dosing, she states she has not started her HCTZ as she was previously directed by nursing staff. Consulted with NP in clinic who advised patient reduce her Losartan to 50mg  and route to Dr. for further advisement.  This is the only blood pressure 147/81 72 that patient could provide!

## 2021-08-03 NOTE — Telephone Encounter (Signed)
Pt c/o medication issue:  1. Name of Medication: losartan (COZAAR) 100 MG tablet  2. How are you currently taking this medication (dosage and times per day)? As directed  3. Are you having a reaction (difficulty breathing--STAT)? Blurry vision  4. What is your medication issue? Patient noticed blurry vision started yesterday

## 2021-08-03 NOTE — Addendum Note (Signed)
Addended by: Marlene Lard on: 08/03/2021 01:40 PM   Modules accepted: Orders

## 2021-08-09 LAB — BASIC METABOLIC PANEL
BUN/Creatinine Ratio: 16 (ref 12–28)
BUN: 15 mg/dL (ref 8–27)
CO2: 24 mmol/L (ref 20–29)
Calcium: 10.1 mg/dL (ref 8.7–10.3)
Chloride: 96 mmol/L (ref 96–106)
Creatinine, Ser: 0.91 mg/dL (ref 0.57–1.00)
Glucose: 185 mg/dL — ABNORMAL HIGH (ref 70–99)
Potassium: 3.8 mmol/L (ref 3.5–5.2)
Sodium: 139 mmol/L (ref 134–144)
eGFR: 72 mL/min/{1.73_m2} (ref >59)

## 2021-08-10 ENCOUNTER — Telehealth (HOSPITAL_BASED_OUTPATIENT_CLINIC_OR_DEPARTMENT_OTHER): Payer: Self-pay | Admitting: Cardiovascular Disease

## 2021-08-10 ENCOUNTER — Emergency Department (HOSPITAL_BASED_OUTPATIENT_CLINIC_OR_DEPARTMENT_OTHER): Payer: 59 | Admitting: Radiology

## 2021-08-10 ENCOUNTER — Other Ambulatory Visit: Payer: Self-pay

## 2021-08-10 ENCOUNTER — Encounter (HOSPITAL_BASED_OUTPATIENT_CLINIC_OR_DEPARTMENT_OTHER): Payer: Self-pay | Admitting: *Deleted

## 2021-08-10 ENCOUNTER — Emergency Department (HOSPITAL_BASED_OUTPATIENT_CLINIC_OR_DEPARTMENT_OTHER)
Admission: EM | Admit: 2021-08-10 | Discharge: 2021-08-10 | Disposition: A | Discharge status: Home / Self Care | Payer: 59 | Attending: Emergency Medicine | Admitting: Emergency Medicine

## 2021-08-10 DIAGNOSIS — I10 Essential (primary) hypertension: Secondary | ICD-10-CM | POA: Insufficient documentation

## 2021-08-10 DIAGNOSIS — Z79899 Other long term (current) drug therapy: Secondary | ICD-10-CM | POA: Insufficient documentation

## 2021-08-10 DIAGNOSIS — R072 Precordial pain: Secondary | ICD-10-CM | POA: Insufficient documentation

## 2021-08-10 DIAGNOSIS — R519 Headache, unspecified: Secondary | ICD-10-CM | POA: Diagnosis not present

## 2021-08-10 DIAGNOSIS — H538 Other visual disturbances: Secondary | ICD-10-CM | POA: Insufficient documentation

## 2021-08-10 DIAGNOSIS — R079 Chest pain, unspecified: Secondary | ICD-10-CM | POA: Diagnosis present

## 2021-08-10 LAB — BASIC METABOLIC PANEL
Anion gap: 8 (ref 5–15)
BUN: 17 mg/dL (ref 8–23)
CO2: 30 mmol/L (ref 22–32)
Calcium: 9.9 mg/dL (ref 8.9–10.3)
Chloride: 98 mmol/L (ref 98–111)
Creatinine, Ser: 0.79 mg/dL (ref 0.44–1.00)
GFR, Estimated: >60 mL/min (ref >60)
Glucose, Bld: 190 mg/dL — ABNORMAL HIGH (ref 70–99)
Potassium: 3.1 mmol/L — ABNORMAL LOW (ref 3.5–5.1)
Sodium: 136 mmol/L (ref 135–145)

## 2021-08-10 LAB — CBC
HCT: 42.8 % (ref 36.0–46.0)
Hemoglobin: 14.1 g/dL (ref 12.0–15.0)
MCH: 28.6 pg (ref 26.0–34.0)
MCHC: 32.9 g/dL (ref 30.0–36.0)
MCV: 86.8 fL (ref 80.0–100.0)
Platelets: 237 10*3/uL (ref 150–400)
RBC: 4.93 MIL/uL (ref 3.87–5.11)
RDW: 12.4 % (ref 11.5–15.5)
WBC: 8.1 10*3/uL (ref 4.0–10.5)
nRBC: 0 % (ref 0.0–0.2)

## 2021-08-10 LAB — TROPONIN I (HIGH SENSITIVITY): Troponin I (High Sensitivity): 4 ng/L (ref <18)

## 2021-08-10 MED ORDER — POTASSIUM CHLORIDE CRYS ER 20 MEQ PO TBCR
40.0000 meq | EXTENDED_RELEASE_TABLET | Freq: Once | ORAL | Status: AC
  Administered 2021-08-10: 40 meq via ORAL
  Filled 2021-08-10: qty 2

## 2021-08-10 NOTE — Discharge Instructions (Addendum)
Please read and follow all provided instructions.  Your diagnoses today include:  1. Precordial pain   2. Frontal headache     Tests performed today include: An EKG of your heart - no concerning changes A chest x-ray -no signs of pneumonia or other problems Cardiac enzymes - a blood test for heart muscle damage was negative Blood counts and electrolytes -showed low potassium of 3.1 Vital signs. See below for your results today.   Medications prescribed:  None  Take any prescribed medications only as directed.  Follow-up instructions: Please follow-up with your primary care provider as soon as you can for further evaluation of your symptoms.   Return instructions:  SEEK IMMEDIATE MEDICAL ATTENTION IF: You have severe chest pain, especially if the pain is crushing or pressure-like and spreads to the arms, back, neck, or jaw, or if you have sweating, nausea or vomiting, or trouble with breathing. THIS IS AN EMERGENCY. Do not wait to see if the pain will go away. Get medical help at once. Call 911. DO NOT drive yourself to the hospital.  Your chest pain gets worse and does not go away after a few minutes of rest.  You have an attack of chest pain lasting longer than what you usually experience.  You have significant dizziness, if you pass out, or have trouble walking.  You have chest pain not typical of your usual pain for which you originally saw your caregiver.  You have any other emergent concerns regarding your health.  Additional Information: Chest pain comes from many different causes. Your caregiver has diagnosed you as having chest pain that is not specific for one problem, but does not require admission.  You are at low risk for an acute heart condition or other serious illness.   Your vital signs today were: BP 137/80    Pulse 75    Temp 97.9 F (36.6 C)    Resp 18    SpO2 98%  If your blood pressure (BP) was elevated above 135/85 this visit, please have this repeated by  your doctor within one month. --------------

## 2021-08-10 NOTE — Telephone Encounter (Signed)
Returned patients call regarding chest pain    Patient complains of chest pain starting yesterday. Patient has real bad headache down into her eyes. Feels like her chest is getting tight  Blood pressure elevated, 145/89, 130/93  Comes and going- 3 times yesterday    Based on symptoms that patient is reporting she was advised to go to the emergency department to be evaluated!

## 2021-08-10 NOTE — ED Triage Notes (Signed)
Pt is here for left sided chest pain, no sob.  Pt reports that she has a headache with blurry vision.

## 2021-08-10 NOTE — Telephone Encounter (Signed)
Pt c/o of Chest Pain: STAT if CP now or developed within 24 hours  1. Are you having CP right now? no  2. Are you experiencing any other symptoms (ex. SOB, nausea, vomiting, sweating)? Nausea, headache, blurry vision  3. How long have you been experiencing CP? Started yesterday  4. Is your CP continuous or coming and going? Comes and goes  5. Have you taken Nitroglycerin? no  08/09/21: 130/93

## 2021-08-10 NOTE — ED Notes (Signed)
Pt verbalized understanding of dc instructions.

## 2021-08-10 NOTE — ED Provider Notes (Signed)
[facility] Provider Note   CSN: [id number] Arrival date & time: 08/10/21  1126     History  Chief Complaint  Patient presents with   Chest Pain    [name] is a 61 y.o. female.  Patient with hypertension, gastroesophageal reflux disease and anxiety/depression, follows with Dr. Oval Linsey of cardiology --presents to the emergency department for evaluation of headache, intermittent chest tightness and blurry vision.  Patient states that her symptoms occurred yesterday.  She reports developing a mild headache, gradually worsening over the course of the day yesterday.  This was behind her bilateral eyes.  She had associated blurry vision but no loss of vision.  Before she went to bed last night she took a Tylenol and went to sleep.  Symptoms are resolved this morning.  Also during the course of the day yesterday she had 2 episodes of short lived left-sided, sharp, chest pain lasting 1 to 2 minutes.  She did not have associated diaphoresis or vomiting.  These episodes started at rest and were nonexertional.  She has had no chest pain today.  She states that she called her cardiology office today to let them know her symptoms and they encouraged her to come to the emergency department for evaluation.  Patient reports recent increase in blood pressure medication dosages. Patient denies signs of stroke including: facial droop, slurred speech, aphasia, weakness/numbness in extremities, imbalance/trouble walking.  She is not somebody that typically gets headaches and this was a new type of headache for her.  No neck pain or difficulty moving her neck.  Cardiac risk factors include hypertension, high cholesterol.  She does not smoke and does not have diabetes per her report.      Home Medications Prior to Admission medications   Medication Sig Start Date End Date Taking? Authorizing Provider  acetaminophen (TYLENOL) 500 MG tablet Take 1,000 mg by mouth daily as  needed for moderate pain or headache.    [provider]  benzonatate (TESSALON PERLES) 100 MG capsule Take 1 capsule (100 mg total) by mouth 3 (three) times daily as needed for cough. 06/06/21 06/06/22  Biagio Borg, MD  cetirizine (ZYRTEC) 10 MG tablet Take 1 tablet (10 mg total) by mouth daily. 04/21/19 06/05/21  Biagio Borg, MD  Cholecalciferol (THERA-D 2000) 50 MCG (2000 UT) TABS 1 tab by mouth once daily 10/25/20   Biagio Borg, MD  esomeprazole (NEXIUM) 20 MG capsule Take 20 mg by mouth daily.     [provider]  estradiol (ESTRACE) 0.5 MG tablet Take 0.5 mg by mouth daily. 08/06/20   [provider]  gabapentin (NEURONTIN) 100 MG capsule Take 2 capsules (200 mg total) by mouth 3 (three) times daily as needed. 08/17/20   Biagio Borg, MD  hydrochlorothiazide (HYDRODIURIL) 25 MG tablet Take 1 tablet (25 mg total) by mouth daily. 08/01/21 10/30/21  Skeet Latch, MD  hydrocortisone 2.5 % cream hydrocortisone 2.5 % topical cream    [provider]  ketoconazole (NIZORAL) 2 % cream ketoconazole 2 % topical cream    [provider]  losartan (COZAAR) 100 MG tablet Take 0.5 tablets (50 mg total) by mouth daily. 08/03/21   Skeet Latch, MD  magic mouthwash (nystatin, lidocaine, diphenhydrAMINE, alum & mag hydroxide) suspension Swish and spit 5 mLs 4 (four) times daily as needed for mouth pain. 06/11/21   Biagio Borg, MD  ondansetron (ZOFRAN) 8 MG tablet Take 1 tablet (8 mg total) by mouth every  8 (eight) hours as needed for nausea or vomiting. 06/05/21   Olive BassMurray, Laura Woodruff, FNP  oseltamivir (TAMIFLU) 75 MG capsule Take 1 capsule (75 mg total) by mouth 2 (two) times daily. 06/05/21   Olive BassMurray, Laura Woodruff, FNP  Probiotic CAPS Take 1 capsule by mouth daily.    [provider]  promethazine (PHENERGAN) 25 MG tablet Take 25 mg by mouth daily as needed for nausea or vomiting.    [provider]  rosuvastatin (CRESTOR) 10 MG tablet  Take 1 tablet (10 mg total) by mouth daily. 06/01/21   [name], [name], MD  Semaglutide (RYBELSUS) 3 MG TABS Take 3 mg by mouth daily. 07/18/21   [name], [name], MD      Allergies    Meperidine, Morphine, Penicillins, Azithromycin, Lisinopril, Sucralfate, Doxycycline, Gabapentin, Other, Tyloxapol, Amoxicillin, Codeine, Hydrocodone, Omeprazole, Oxycodone, Oxycodone-acetaminophen, and Tramadol    Review of Systems   Review of Systems  Physical Exam Updated Vital Signs BP 137/80    Pulse 75    Temp 97.9 F (36.6 C)    Resp 18    SpO2 98%  Physical Exam Vitals and nursing note reviewed.  Constitutional:      General: She is not in acute distress.    Appearance: She is well-developed. She is not diaphoretic.  HENT:     Head: Normocephalic and atraumatic.     Right Ear: External ear normal.     Left Ear: External ear normal.     Nose: Nose normal.     Mouth/Throat:     Mouth: Mucous membranes are not dry.  Eyes:     Conjunctiva/sclera: Conjunctivae normal.  Neck:     Vascular: Normal carotid pulses. No JVD.     Trachea: Trachea normal. No tracheal deviation.  Cardiovascular:     Rate and Rhythm: Normal rate and regular rhythm.     Pulses: No decreased pulses.          Radial pulses are 2+ on the right side and 2+ on the left side.     Heart sounds: Normal heart sounds, S1 normal and S2 normal. No murmur heard. Pulmonary:     Effort: Pulmonary effort is normal. No respiratory distress.     Breath sounds: No wheezing, rhonchi or rales.  Chest:     Chest wall: No tenderness.  Abdominal:     General: Bowel sounds are normal.     Palpations: Abdomen is soft.     Tenderness: There is no abdominal tenderness. There is no guarding or rebound.  Musculoskeletal:        General: Normal range of motion.     Cervical back: Normal range of motion and neck supple. No muscular tenderness.     Right lower leg: No edema.     Left lower leg: No edema.  Skin:    General: Skin is warm and dry.      Coloration: Skin is not pale.     Findings: No rash.  Neurological:     General: No focal deficit present.     Mental Status: She is alert. Mental status is at baseline.     Motor: No weakness.  Psychiatric:        Mood and Affect: Mood normal.    ED Results / Procedures / Treatments   Labs (all labs ordered are listed, but only abnormal results are displayed) Labs Reviewed  BASIC METABOLIC PANEL - Abnormal; Notable for the following components:      Result  Value   Potassium 3.1 (*)    Glucose, Bld 190 (*)    All other components within normal limits  CBC  TROPONIN I (HIGH SENSITIVITY)    ED ECG REPORT   Date: 08/10/2021  Rate: 83  Rhythm: normal sinus rhythm  QRS Axis: normal  Intervals: normal  ST/T Wave abnormalities: nonspecific T wave changes  Conduction Disutrbances:none  Narrative Interpretation:   Old EKG Reviewed: unchanged from 04/2021, similar inferolateral t-wave abnormality  I have personally reviewed the EKG tracing and agree with the computerized printout as noted.   Radiology No results found.  Procedures Procedures    Medications Ordered in ED Medications - No data to display  ED Course/ Medical Decision Making/ A&P    Patient seen and examined. History obtained directly from patient. Work-up including labs, imaging, EKG ordered in triage, if performed, were reviewed.    Labs/EKG: Independently reviewed and interpreted.  This included: CBC which was normal, BMP with low potassium 3.1 and glucose elevated at 190, troponin normal at 4.  EKG interpreted as above.  No concerning changes.  Imaging: Independently reviewed and interpreted.  This included: Chest x-ray without signs of infiltrate or other problems.  Medications/Fluids: Ordered: Potassium repletion   Most recent vital signs reviewed and are as follows: BP 137/80    Pulse 75    Temp 97.9 F (36.6 C)    Resp 18    SpO2 98%   Initial impression: Atypical chest pain, headache.   Patient symptoms occurred yesterday.  Work-up today is reassuring.  No focal neuro symptoms.  Patient with headache, no red flags other than new headache and age.  I doubt that noncontrast CT would be of benefit at this time.  Encourage PCP follow-up for this.  Patient states that she is willing to follow-up with her PCP and cardiologist.  We discussed signs and symptoms to return.  Return and follow-up instructions: I encouraged patient to return to ED with severe chest pain, especially if the pain is crushing or pressure-like and spreads to the arms, back, neck, or jaw, or if they have associated sweating, vomiting, or shortness of breath with the pain, or significant pain with activity. We discussed that the evaluation here today indicates a low-risk of serious cause of chest pain, including heart trouble or a blood clot, but no evaluation is perfect and chest pain can evolve with time. The patient verbalized understanding and agreed.  I encouraged patient to follow-up with their provider in the next 48 hours for recheck.    Patient counseled to return if they have weakness in their arms or legs, slurred speech, trouble walking or talking, confusion, trouble with their balance, or if they have any other concerns. Patient verbalizes understanding and agrees with plan.                           Medical Decision Making Amount and/or Complexity of Data Reviewed Labs: ordered. Radiology: ordered.   CP: For this patient's complaint of chest pain, the following emergent conditions were considered on the differential diagnosis: acute coronary syndrome, pulmonary embolism, pneumothorax, myocarditis, pericardial tamponade, aortic dissection, thoracic aortic aneurysm complication, esophageal perforation.   Other causes were also considered including: gastroesophageal reflux disease, musculoskeletal pain including costochondritis, pneumonia/pleurisy, herpes zoster, pericarditis.  In regards to possibility  of ACS, patient has atypical features of pain, non-ischemic and unchanged EKG and negative troponin(s). Heart score was calculated to be 3.   In  regards to possibility of PE, symptoms are atypical for PE and risk profile is low, making PE low likelihood.   In regards to the patient's HA, patient without high-risk features of headache including: sudden onset/thunderclap HA, altered mental status, accompanying seizure, headache with exertion, history of immunocompromise, neck or shoulder pain, fever, use of anticoagulation, family history of spontaneous SAH, concomitant drug use, toxic exposure.   Patient has a normal complete neurological exam, normal vital signs, normal level of consciousness, no signs of meningismus, is well-appearing/non-toxic appearing, no signs of trauma.   Considered CT imaging and discussed with patient.  Feel that she is low risk for bleeding given resolution of symptoms, feel that imaging would be likely low benefit today.  We did discuss signs symptoms that should cause her to return.  No dangerous or life-threatening conditions suspected or identified by history, physical exam, and by work-up. No indications for hospitalization identified.   The patient's vital signs, pertinent lab work and imaging were reviewed and interpreted as discussed in the ED course. Hospitalization was considered for further testing, treatments, or serial exams/observation. However as patient is well-appearing, has a stable exam, and reassuring studies today, I do not feel that they warrant admission at this time. This plan was discussed with the patient who verbalizes agreement and comfort with this plan and seems reliable and able to return to the Emergency Department with worsening or changing symptoms.   Low potassium: Potentially related to recent increase in blood pressure medication, should be rechecked by PCP.  Repletion given today.  Blurry vision: Would follow-up with PCP, may benefit  from ophthalmology evaluation especially if blood sugars remain high.         Final Clinical Impression(s) / ED Diagnoses Final diagnoses:  Precordial pain  Frontal headache    Rx / DC Orders ED Discharge Orders     None         Carlisle Cater, PA-C 08/10/21 [place] A, DO 08/10/21 1555

## 2021-08-14 ENCOUNTER — Telehealth: Payer: Self-pay

## 2021-08-14 NOTE — Telephone Encounter (Signed)
Pt would like to see start seeing Vernona Rieger again and asks if this is okay.   Please advise.

## 2021-08-20 NOTE — Progress Notes (Signed)
Office Visit    Patient Name: [name] Date of Encounter: 08/21/2021  Primary Care Provider:  Biagio Borg, MD Primary Cardiologist:  Stacey Latch, MD  Chief Complaint    61 year old female with a history of chest pain, hypertension, GERD, anxiety and depression who presents for follow up related to chest pain.   Past Medical History    Past Medical History:  Diagnosis Date   Anemia    ANEMIA-IRON DEFICIENCY 01/03/2007   Anxiety    ANXIETY [id number]   Complication of anesthesia    severe post-operative nausea except for bladder surgery in 2002 at [facility] not have complications then   Depression    DEPRESSION 01/03/2007   DJD (degenerative joint disease)    knee   Enlarged thyroid 10/18/2019   Family history of adverse reaction to anesthesia    mother and father both had severe post-operative nausea and vomiting   GERD 01/16/2009   GERD (gastroesophageal reflux disease)    Headache(784.0) 01/03/2007   Hyperplastic colon polyp    04/2010   HYPERSOMNIA 04/04/2010   Hypertension    IBS 01/16/2009   IBS (irritable bowel syndrome)    Impaired glucose tolerance 05/18/2011   Menopause    early   Migraine    Obesity    OSTEOARTHRITIS, KNEES, 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   PONV (postoperative nausea and vomiting)    Wheezing 07/02/2010   Past Surgical History:  Procedure Laterality Date   ABDOMINAL HYSTERECTOMY     APPENDECTOMY     bladder tac     CHOLECYSTECTOMY     KNEE ARTHROSCOPY Bilateral    OOPHORECTOMY      Allergies  Allergies  Allergen Reactions   Meperidine Shortness Of Breath   Morphine Anaphylaxis   Penicillins Hives, Nausea Only, Swelling, Other (See Comments), Anaphylaxis and Nausea And Vomiting    PATIENT HAS HAD A PCN REACTION WITH IMMEDIATE RASH, FACIAL/TONGUE/THROAT SWELLING, SOB, OR LIGHTHEADEDNESS WITH HYPOTENSION:  #  #  YES  #  #  HAS PT DEVELOPED SEVERE RASH INVOLVING MUCUS MEMBRANES or SKIN NECROSIS: #  #  YES  #  #  PATIENT HAS  HAD A PCN REACTION THAT REQUIRED HOSPITALIZATION:  #  #  YES  #  #  Has patient had a PCN reaction occurring within the last 10 years: No  vomiting PATIENT HAS HAD A PCN REACTION WITH IMMEDIATE RASH, FACIAL/TONGUE/THROAT SWELLING, SOB, OR LIGHTHEADEDNESS WITH HYPOTENSION:  #  #  YES  #  #  HAS PT DEVELOPED SEVERE RASH INVOLVING MUCUS MEMBRANES or SKIN NECROSIS: #  #  YES  #  #  PATIENT HAS HAD A PCN REACTION THAT REQUIRED HOSPITALIZATION:  #  #  YES  #  #  Has patient had a PCN reaction occurring within the last 10 years: No   Azithromycin Nausea And Vomiting and Nausea Only   Lisinopril Cough        Sucralfate Rash and Other (See Comments)    Blurry vision  Blurry vision     Doxycycline Nausea Only    nausea   Gabapentin Other (See Comments)    Muscle pain, fatigue   Other    Tyloxapol Other (See Comments)   Amoxicillin Nausea Only   Codeine Nausea Only and Other (See Comments)    Hot flashes also   Hydrocodone Itching, Nausea Only and Rash   Omeprazole Itching and Rash   Oxycodone Nausea Only, Rash and Nausea And Vomiting   Oxycodone-Acetaminophen Nausea  And Vomiting and Rash   Tramadol Itching, Nausea Only and Rash    History of Present Illness    61 year old female with the above past medical history including chest pain, hypertension, GERD, anxiety and depression who presents for follow up related to chest pain.   She was initially evaluated by Dr. Oval [name] in 2016 for dizziness and dyspnea on exertion.  Her symptoms were felt to be neurocardiogenic and she was asked to wear compression stockings and. Lexiscan myoview in 2016 was negative for ischemia, echocardiogram showed grade 1 diastolic dysfunction.  Her blood pressure was labile. She reported a lot of stress and anxiety at the time. She reported lower extremity numbness and claudication, ABIs in 2018 were normal bilaterally.  Her thyroid was enlarged and she was referred for thyroid ultrasound which revealed multiple  bilateral nodules.  She underwent biopsy consistent with benign follicular nodule with recommended serial ultrasounds.  She was last seen in the office on 04/26/2021 and was stable overall from a cardiac standpoint. She did report limited activity in the setting of back pain.  He was mildly elevated.  She did report some chest tightness which she associated with high blood pressure. Repeat Lexiscan Myoview was ordered in the setting of chest pain with exertion but never completed.   She called the office in January 2023 with complaints of elevated blood pressure readings, losartan was increased, her blood pressure remained elevated, HCTZ was added. She called on 08/03/2021 reporting blurry vision.  She thought it was related to the increase in her losartan.  [name] was subsequently decreased.  She called the office again on 08/10/2021 with complaints of chest pain. She was advised to go to the ED.  EKG was unchanged from November 2022, and showed an inferolateral T wave abnormality. Troponin was negative. Chest x-ray was unremarkable. Potassium was low at 3.1 and was supplemented.  Additionally, she reported a headache. No further testing was recommended.  Outpatient follow-up with PCP and cardiology was recommended.  She presents today for follow-up. Since her last visit and recent ED visit she continues to have intermittent chest pain at rest.  Her pain occurs 1-2 times per week and lasts less than 5 minutes, it is frequently in the setting of increased stress and anxiety. She denies any other associated symptoms.  She is also concerned about her BP.  She has had labile blood pressures at home with some readings in the 120s/70s and others in the 140s-150/80s, most elevated readings in the evenings before bed. She has continued to have some mild blurred vision despite the decrease in her losartan.  He has chronic back pain for which she is seeking treatment with a pain clinic.  Other than her BP and her chest  pain, she denies any other cardiac concerns or complaints today.  Home Medications    Current Outpatient Medications  Medication Sig Dispense Refill   acetaminophen (TYLENOL) 500 MG tablet Take 1,000 mg by mouth daily as needed for moderate pain or headache.     benzonatate (TESSALON PERLES) 100 MG capsule Take 1 capsule (100 mg total) by mouth 3 (three) times daily as needed for cough. 40 capsule 1   cetirizine (ZYRTEC) 10 MG tablet Take 1 tablet (10 mg total) by mouth daily. 30 tablet 11   Cholecalciferol (THERA-D 2000) 50 MCG (2000 UT) TABS 1 tab by mouth once daily 30 tablet 99   esomeprazole (NEXIUM) 20 MG capsule Take 20 mg by mouth daily.  estradiol (ESTRACE) 0.5 MG tablet Take 0.5 mg by mouth daily.     gabapentin (NEURONTIN) 100 MG capsule Take 2 capsules (200 mg total) by mouth 3 (three) times daily as needed. 180 capsule 5   hydrocortisone 2.5 % cream hydrocortisone 2.5 % topical cream     ketoconazole (NIZORAL) 2 % cream ketoconazole 2 % topical cream     magic mouthwash (nystatin, lidocaine, diphenhydrAMINE, alum & mag hydroxide) suspension Swish and spit 5 mLs 4 (four) times daily as needed for mouth pain. 180 mL 0   omeprazole (PRILOSEC) 40 MG capsule Take 40 mg by mouth daily.     ondansetron (ZOFRAN) 8 MG tablet Take 1 tablet (8 mg total) by mouth every 8 (eight) hours as needed for nausea or vomiting. 20 tablet 0   Probiotic CAPS Take 1 capsule by mouth daily.     promethazine (PHENERGAN) 25 MG tablet Take 25 mg by mouth daily as needed for nausea or vomiting.     rosuvastatin (CRESTOR) 10 MG tablet Take 1 tablet (10 mg total) by mouth daily. 90 tablet 3   Semaglutide (RYBELSUS) 3 MG TABS Take 3 mg by mouth daily. 90 tablet 3   hydrochlorothiazide (HYDRODIURIL) 25 MG tablet Take 1 tablet (25 mg total) by mouth daily. (Patient not taking: Reported on 08/21/2021) 90 tablet 3   losartan (COZAAR) 100 MG tablet Take 1 tablet (100 mg total) by mouth daily. 90 tablet 3   No  current facility-administered medications for this visit.     Review of Systems    She denies palpitations, dyspnea, pnd, orthopnea, n, v, dizziness, syncope, edema, weight gain, or early satiety. All other systems reviewed and are otherwise negative except as noted above.   Physical Exam    VS:  BP 132/78 (BP Location: Left Arm, Patient Position: Sitting, Cuff Size: Large)    Pulse 78    Ht 5\' 5"  (1.651 m)    Wt 233 lb (105.7 kg)    BMI 38.77 kg/m  GEN: Well nourished, well developed, in no acute distress. HEENT: normal. Neck: Supple, no JVD, carotid bruits, or masses. Cardiac: RRR, no murmurs, rubs, or gallops. No clubbing, cyanosis, edema.  Radials/DP/PT 2+ and equal bilaterally.  Respiratory:  Respirations regular and unlabored, clear to auscultation bilaterally. GI: Soft, nontender, nondistended, BS + x 4. MS: no deformity or atrophy. Skin: warm and dry, no rash. Neuro:  Strength and sensation are intact. Psych: Normal affect.  Accessory Clinical Findings    ECG personally reviewed by me today -NSR, 70 bpm nonspecific T wave abnormality, seen on prior EKG- no acute changes.  Lab Results  Component Value Date   WBC 8.1 08/10/2021   HGB 14.1 08/10/2021   HCT 42.8 08/10/2021   MCV 86.8 08/10/2021   PLT 237 08/10/2021   Lab Results  Component Value Date   CREATININE 0.79 08/10/2021   BUN 17 08/10/2021   NA 136 08/10/2021   K 3.1 (L) 08/10/2021   CL 98 08/10/2021   CO2 30 08/10/2021   Lab Results  Component Value Date   ALT 22 05/28/2021   AST 19 05/28/2021   ALKPHOS 53 05/28/2021   BILITOT 0.5 05/28/2021   Lab Results  Component Value Date   CHOL 192 05/28/2021   HDL 65.40 05/28/2021   LDLCALC 100 (H) 05/28/2021   LDLDIRECT 124.0 05/26/2020   TRIG 133.0 05/28/2021   CHOLHDL 3 05/28/2021    Lab Results  Component Value Date   HGBA1C 7.1 (H)  05/28/2021    Assessment & Plan    1. Chest pain: Lexiscan myoview in 2016 was negative for ischemia,  echocardiogram showed grade 1 diastolic dysfunction.  Reported chest pain at her visit in November 2022 with Dr. Oval [name].  Stress test was recommended but never completed.  He states this was due to a schedule conflict.  Ongoing intermittent chest pain since November.  It occurs 1-2 times a week at rest, lasts for less than 5 minutes, with no associated symptoms and often occurs in the setting of increased stress or anxiety.  She was seen in the ED on 08/10/2021 for chest pain, work-up was reassuring.  She does have a contrast allergy and therefore coronary CTA is not an option at this time.  Given ongoing symptoms, will proceed with Lexiscan Myoview.  Patient verbalized understanding and is agreeable to proceed.  The 10-year ASCVD risk score (Arnett DK, et al., 2019) is: 8.6%. Continue losartan as below, Crestor.  Shared Decision Making/Informed Consent The risks [chest pain, shortness of breath, cardiac arrhythmias, dizziness, blood pressure fluctuations, myocardial infarction, stroke/transient ischemic attack, nausea, vomiting, allergic reaction, radiation exposure, metallic taste sensation and life-threatening complications (estimated to be 1 in 10,000)], benefits (risk stratification, diagnosing coronary artery disease, treatment guidance) and alternatives of a nuclear stress test were discussed in detail with Ms. Dirks and she agrees to proceed.  2. Hypertension: She continues to have labile blood pressure at home.  She reported blurred vision after increasing her losartan to 100 mg daily.  Losartan was subsequently decreased to 50 mg daily.  Her vision changes have not improved with lower dose of losartan.  Discussed alternative BP management options including transitioning from losartan to valsartan, addition of amlodipine, or increasing losartan to 100 mg.  She would like to increase losartan at this time.  Increase losartan to 100 mg daily.  She will monitor home BP and report BP readings  consistently greater than 130/80.   3. Blurred vision: She has a reported history of blurred vision as a potential side effect of Carafate.  She felt that she had an increase in blurry vision following increase in losartan. Her her symptoms did not improve with use losartan dose.  Uncontrolled hypertension could be contributing to her symptoms.  If symptoms do not improve with better BP control, consider follow-up with PCP.  4. Hypokalemia: Potassium was 3.1 at recent ED visit.  Potassium was supplemented. Repeat BMET today.    5. Disposition: Follow-up in 6-8 weeks.  Lenna Sciara, NP 08/21/2021, 4:21 PM

## 2021-08-21 ENCOUNTER — Encounter (HOSPITAL_BASED_OUTPATIENT_CLINIC_OR_DEPARTMENT_OTHER): Payer: Self-pay | Admitting: Cardiovascular Disease

## 2021-08-21 ENCOUNTER — Encounter (HOSPITAL_BASED_OUTPATIENT_CLINIC_OR_DEPARTMENT_OTHER): Payer: Self-pay | Admitting: Nurse Practitioner

## 2021-08-21 ENCOUNTER — Other Ambulatory Visit: Payer: Self-pay

## 2021-08-21 ENCOUNTER — Ambulatory Visit (HOSPITAL_BASED_OUTPATIENT_CLINIC_OR_DEPARTMENT_OTHER): Payer: 59 | Admitting: Nurse Practitioner

## 2021-08-21 VITALS — BP 132/78 | HR 78 | Ht 65.0 in | Wt 233.0 lb

## 2021-08-21 DIAGNOSIS — H538 Other visual disturbances: Secondary | ICD-10-CM | POA: Diagnosis not present

## 2021-08-21 DIAGNOSIS — E876 Hypokalemia: Secondary | ICD-10-CM

## 2021-08-21 DIAGNOSIS — R072 Precordial pain: Secondary | ICD-10-CM

## 2021-08-21 DIAGNOSIS — I10 Essential (primary) hypertension: Secondary | ICD-10-CM | POA: Diagnosis not present

## 2021-08-21 LAB — BASIC METABOLIC PANEL
BUN/Creatinine Ratio: 13 (ref 12–28)
BUN: 11 mg/dL (ref 8–27)
CO2: 23 mmol/L (ref 20–29)
Calcium: 9.7 mg/dL (ref 8.7–10.3)
Chloride: 105 mmol/L (ref 96–106)
Creatinine, Ser: 0.87 mg/dL (ref 0.57–1.00)
Glucose: 128 mg/dL — ABNORMAL HIGH (ref 70–99)
Potassium: 4.6 mmol/L (ref 3.5–5.2)
Sodium: 143 mmol/L (ref 134–144)
eGFR: 76 mL/min/{1.73_m2} (ref >59)

## 2021-08-21 MED ORDER — LOSARTAN POTASSIUM 100 MG PO TABS: 100.0000 mg | ORAL_TABLET | Freq: Every day | ORAL | 3 refills | Status: DC

## 2021-08-21 NOTE — Patient Instructions (Signed)
Medication Instructions:  Your physician has recommended you make the following change in your medication:   Change: Losartan 100mg  daily   *If you need a refill on your cardiac medications before your next appointment, please call your pharmacy*  Testing/Procedures: Your physician has requested that you have a lexiscan myoview. For further information please visit . Please follow instruction sheet, as given.    Follow-Up: At [facility], you and your health needs are our priority.  As part of our continuing mission to provide you with exceptional heart care, we have created designated Provider Care Teams.  These Care Teams include your primary Cardiologist (physician) and Advanced Practice Providers (APPs -  Physician Assistants and Nurse Practitioners) who all work together to provide you with the care you need, when you need it.  We recommend signing up for the patient portal called "MyChart".  Sign up information is provided on this After Visit Summary.  MyChart is used to connect with patients for Virtual Visits (Telemedicine).  Patients are able to view lab/test results, encounter notes, upcoming appointments, etc.  Non-urgent messages can be sent to your provider as well.   To learn more about what you can do with MyChart, go to [facility].    Your next appointment:   6-8 week(s)  The format for your next appointment:   In Person  Provider:   [email or web address], MD, Chilton Si, NP, or Gillian Shields, NP{  Other Instructions Continue checking your blood pressures at home as you have been and call Edd Fabian if your blood pressure is consistently greater than 130/80!

## 2021-08-22 ENCOUNTER — Telehealth (HOSPITAL_BASED_OUTPATIENT_CLINIC_OR_DEPARTMENT_OTHER): Payer: Self-pay

## 2021-08-22 NOTE — Telephone Encounter (Addendum)
Results called to patient who verbalizes understanding!  ? ? ?----- Message from Alver Sorrow, NP sent at 08/22/2021  7:37 AM EST ----- ?Normal kidney function. Potassium has normalized. Good result! ?

## 2021-08-23 NOTE — Telephone Encounter (Signed)
Patient called back to check on Laura's response. Pt advised that we would call her once Mickel Baas has a chance to answer.  ?

## 2021-09-06 ENCOUNTER — Other Ambulatory Visit: Payer: Self-pay

## 2021-09-06 ENCOUNTER — Ambulatory Visit (HOSPITAL_COMMUNITY): Payer: 59

## 2021-09-06 ENCOUNTER — Ambulatory Visit: Payer: 59 | Admitting: Internal Medicine

## 2021-09-06 ENCOUNTER — Encounter: Payer: Self-pay | Admitting: Internal Medicine

## 2021-09-06 VITALS — BP 118/68 | HR 72 | Ht 65.0 in | Wt 229.0 lb

## 2021-09-06 DIAGNOSIS — E6609 Other obesity due to excess calories: Secondary | ICD-10-CM

## 2021-09-06 DIAGNOSIS — R7303 Prediabetes: Secondary | ICD-10-CM | POA: Diagnosis not present

## 2021-09-06 DIAGNOSIS — E041 Nontoxic single thyroid nodule: Secondary | ICD-10-CM

## 2021-09-06 DIAGNOSIS — E559 Vitamin D deficiency, unspecified: Secondary | ICD-10-CM | POA: Diagnosis not present

## 2021-09-06 DIAGNOSIS — Z6838 Body mass index (BMI) 38.0-38.9, adult: Secondary | ICD-10-CM

## 2021-09-06 NOTE — Patient Instructions (Signed)
Please continue all other medications as before, and refills have been done if requested. ? ?Please have the pharmacy call with any other refills you may need. ? ?Please keep your appointments with your specialists as you may have planned ? ?Please let me know if the insurance covers the [facility] by calling the insurance or better yet, maybe contacting the HR department to see if covered with a prior authorization ? ?Please continue all other medications as before, and refills have been done if requested. ? ?Please have the pharmacy call with any other refills you may need. ? ?Please keep your appointments with your specialists as you may have planned ? ?You will be contacted regarding the referral for: Thyroid ultrasound, as well as Endocrinology (Dr Brooks Sailors) ? ? ? ? ?' ?

## 2021-09-06 NOTE — Assessment & Plan Note (Signed)
Last vitamin D Lab Results  Component Value Date   VD25OH 23.95 (L) 05/28/2021   Low, to start oral replacement  

## 2021-09-06 NOTE — Progress Notes (Signed)
Patient ID: [name], female   DOB: 1960/08/20, 61 y.o.   MRN: [id number] ? ? ? ?    Chief Complaint: follow up thyroid nodule, preDM and obesity ? ?     HPI:  [name] is a 61 y.o. female here with c/o asking to f/u for thyroid nodule as is wondering but not sure if thyroid seems larger now.  Pt denies HA, fever, chills, ST, cough, and Denies hyper or hypo thyroid symptoms such as voice, skin or hair change.   Pt denies polydipsia, polyuria, or new focal neuro s/s.    Pt denies fever, wt loss, night sweats, loss of appetite, or other constitutional symptoms  Pt also wondering about wegovy for wt loss, has not been able to lose wt with diet and exercise.   Not taking Vit D ?      ?Wt Readings from Last 3 Encounters:  ?09/06/21 229 lb (103.9 kg)  ?08/21/21 233 lb (105.7 kg)  ?06/05/21 225 lb (102.1 kg)  ? ?BP Readings from Last 3 Encounters:  ?09/06/21 118/68  ?08/21/21 132/78  ?08/10/21 132/77  ? ?      ?Past Medical History:  ?Diagnosis Date  ? Anemia   ? ANEMIA-IRON DEFICIENCY 01/03/2007  ? Anxiety   ? ANXIETY 01/03/2007  ? Complication of anesthesia   ? severe post-operative nausea except for bladder surgery in 2002 at [facility]-did not have complications then  ? Depression   ? DEPRESSION 01/03/2007  ? DJD (degenerative joint disease)   ? knee  ? Enlarged thyroid 10/18/2019  ? Family history of adverse reaction to anesthesia   ? mother and father both had severe post-operative nausea and vomiting  ? GERD 01/16/2009  ? GERD (gastroesophageal reflux disease)   ? Headache(784.0) 01/03/2007  ? Hyperplastic colon polyp   ? 04/2010  ? HYPERSOMNIA 04/04/2010  ? Hypertension   ? IBS 01/16/2009  ? IBS (irritable bowel syndrome)   ? Impaired glucose tolerance 05/18/2011  ? Menopause   ? early  ? Migraine   ? Obesity   ? 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 PONV (postoperative nausea and vomiting)   ? Wheezing 07/02/2010  ? ?Past Surgical History:  ?Procedure Laterality Date  ? ABDOMINAL HYSTERECTOMY    ?  APPENDECTOMY    ? bladder tac    ? CHOLECYSTECTOMY    ? KNEE ARTHROSCOPY Bilateral   ? OOPHORECTOMY    ? ? reports that she has never smoked. She has never used smokeless tobacco. She reports that she does not drink alcohol and does not use drugs. ?family history includes Diabetes in her brother; Hypertension in her mother. ?Allergies  ?Allergen Reactions  ? Meperidine Shortness Of Breath  ? Morphine Anaphylaxis  ? Penicillins Hives, Nausea Only, Swelling, Other (See Comments), Anaphylaxis and Nausea And Vomiting  ?  PATIENT HAS HAD A PCN REACTION WITH IMMEDIATE RASH, FACIAL/TONGUE/THROAT SWELLING, SOB, OR LIGHTHEADEDNESS WITH HYPOTENSION:  #  #  YES  #  #  ?HAS PT DEVELOPED SEVERE RASH INVOLVING MUCUS MEMBRANES or SKIN NECROSIS: #  #  YES  #  # ? PATIENT HAS HAD A PCN REACTION THAT REQUIRED HOSPITALIZATION:  #  #  YES  #  #  ?Has patient had a PCN reaction occurring within the last 10 years: No ? ?vomiting ?PATIENT HAS HAD A PCN REACTION WITH IMMEDIATE RASH, FACIAL/TONGUE/THROAT SWELLING, SOB, OR LIGHTHEADEDNESS WITH HYPOTENSION:  #  #  YES  #  #  ?HAS PT DEVELOPED  SEVERE RASH INVOLVING MUCUS MEMBRANES or SKIN NECROSIS: #  #  YES  #  # ? PATIENT HAS HAD A PCN REACTION THAT REQUIRED HOSPITALIZATION:  #  #  YES  #  #  ?Has patient had a PCN reaction occurring within the last 10 years: No  ? Azithromycin Nausea And Vomiting and Nausea Only  ? Lisinopril Cough  ?     ? Sucralfate Rash and Other (See Comments)  ?  Blurry vision  ?Blurry vision  ?  ? Doxycycline Nausea Only  ?  nausea  ? Gabapentin Other (See Comments)  ?  Muscle pain, fatigue  ? Other   ? Tyloxapol Other (See Comments)  ? Amoxicillin Nausea Only  ? Codeine Nausea Only and Other (See Comments)  ?  Hot flashes also  ? Hydrocodone Itching, Nausea Only and Rash  ? Omeprazole Itching and Rash  ? Oxycodone Nausea Only, Rash and Nausea And Vomiting  ? Oxycodone-Acetaminophen Nausea And Vomiting and Rash  ? Tramadol Itching, Nausea Only and Rash  ? ?Current  Outpatient Medications on File Prior to Visit  ?Medication Sig Dispense Refill  ? acetaminophen (TYLENOL) 500 MG tablet Take 1,000 mg by mouth daily as needed for moderate pain or headache.    ? Cholecalciferol (THERA-D 2000) 50 MCG (2000 UT) TABS 1 tab by mouth once daily (Patient taking differently: 3,000 Units. 1 tab by mouth twice daily) 30 tablet 99  ? clindamycin (CLEOCIN) 300 MG capsule Take 300 mg by mouth every 6 (six) hours.    ? estradiol (ESTRACE) 0.5 MG tablet Take 0.5 mg by mouth daily.    ? gabapentin (NEURONTIN) 100 MG capsule Take 2 capsules (200 mg total) by mouth 3 (three) times daily as needed. 180 capsule 5  ? hydrocortisone 2.5 % cream hydrocortisone 2.5 % topical cream    ? ketoconazole (NIZORAL) 2 % cream ketoconazole 2 % topical cream    ? losartan (COZAAR) 100 MG tablet Take 1 tablet (100 mg total) by mouth daily. 90 tablet 3  ? magic mouthwash (nystatin, lidocaine, diphenhydrAMINE, alum & mag hydroxide) suspension Swish and spit 5 mLs 4 (four) times daily as needed for mouth pain. 180 mL 0  ? omeprazole (PRILOSEC) 40 MG capsule Take 40 mg by mouth daily.    ? Probiotic CAPS Take 1 capsule by mouth daily.    ? promethazine (PHENERGAN) 25 MG tablet Take 25 mg by mouth daily as needed for nausea or vomiting.    ? rosuvastatin (CRESTOR) 10 MG tablet Take 1 tablet (10 mg total) by mouth daily. 90 tablet 3  ? cetirizine (ZYRTEC) 10 MG tablet Take 1 tablet (10 mg total) by mouth daily. 30 tablet 11  ? esomeprazole (NEXIUM) 20 MG capsule Take 20 mg by mouth daily.  (Patient not taking: Reported on 09/06/2021)    ? Semaglutide (RYBELSUS) 3 MG TABS Take 3 mg by mouth daily. (Patient not taking: Reported on 09/06/2021) 90 tablet 3  ? ?No current facility-administered medications on file prior to visit.  ? ?     ROS:  All others reviewed and negative. ? ?Objective  ? ?     PE:  BP 118/68 (BP Location: Left Arm, Patient Position: Sitting, Cuff Size: Large)   Pulse 72   Ht 5\' 5"  (1.651 m)   Wt 229 lb  (103.9 kg)   SpO2 98%   BMI 38.11 kg/m?  ? ?  Constitutional: Pt appears in NAD ?              HENT: Head: NCAT.  ?              Right Ear: External ear normal.   ?              Left Ear: External ear normal.  ?              Eyes: . Pupils are equal, round, and reactive to light. Conjunctivae and EOM are normal ?              Nose: without d/c or deformity ?              Neck: Neck supple. Gross normal ROM; thyroid with fullness enlarged left > rigth, no discrete mass appreciated ?              Cardiovascular: Normal rate and regular rhythm.   ?              Pulmonary/Chest: Effort normal and breath sounds without rales or wheezing.  ?              Abd:  Soft, NT, ND, + BS, no organomegaly ?              Neurological: Pt is alert. At baseline orientation, motor grossly intact ?              Skin: Skin is warm. No rashes, no other new lesions, LE edema - none ?              Psychiatric: Pt behavior is normal without agitation  ? ?Micro: none ? ?Cardiac tracings I have personally interpreted today:  none ? ?Pertinent Radiological findings (summarize): none  ? ?Lab Results  ?Component Value Date  ? WBC 8.1 08/10/2021  ? HGB 14.1 08/10/2021  ? HCT 42.8 08/10/2021  ? PLT 237 08/10/2021  ? GLUCOSE 128 (H) 08/21/2021  ? CHOL 192 05/28/2021  ? TRIG 133.0 05/28/2021  ? HDL 65.40 05/28/2021  ? LDLDIRECT 124.0 05/26/2020  ? LDLCALC 100 (H) 05/28/2021  ? ALT 22 05/28/2021  ? AST 19 05/28/2021  ? NA 143 08/21/2021  ? K 4.6 08/21/2021  ? CL 105 08/21/2021  ? CREATININE 0.87 08/21/2021  ? BUN 11 08/21/2021  ? CO2 23 08/21/2021  ? TSH 0.70 05/28/2021  ? HGBA1C 7.1 (H) 05/28/2021  ? MICROALBUR <0.7 05/28/2021  ? ?Assessment/Plan:  ?[name] is a 61 y.o. White or Caucasian [1] female with  has a past medical history of Anemia, ANEMIA-IRON DEFICIENCY (01/03/2007), Anxiety, ANXIETY (01/03/2007), Complication of anesthesia, Depression, DEPRESSION (01/03/2007), DJD (degenerative joint disease), Enlarged thyroid  (10/18/2019), Family history of adverse reaction to anesthesia, GERD (01/16/2009), GERD (gastroesophageal reflux disease), Headache(784.0) (01/03/2007), Hyperplastic colon polyp, HYPERSOMNIA (04/04/2010), Hypertension, IBS

## 2021-09-07 ENCOUNTER — Ambulatory Visit (HOSPITAL_COMMUNITY): Payer: 59

## 2021-09-09 ENCOUNTER — Encounter: Payer: Self-pay | Admitting: Internal Medicine

## 2021-09-09 NOTE — Assessment & Plan Note (Signed)
Lab Results  ?Component Value Date  ? HGBA1C 7.1 (H) 05/28/2021  ? ?Mild uncontrolled, pt to continue current medical treatment  - diet, wt control as declines metformin; pt to check with insurance regarding wegovy coverage; also refer to Endo per pt request ?

## 2021-09-09 NOTE — Assessment & Plan Note (Signed)
With some ? Of enlarging, for f/u thryoid u/s, consdier biopsy if meets criteria ?

## 2021-09-09 NOTE — Assessment & Plan Note (Signed)
Pt to consider wegovy if covered by insurance ?

## 2021-09-12 ENCOUNTER — Ambulatory Visit
Admission: RE | Admit: 2021-09-12 | Discharge: 2021-09-12 | Disposition: A | Discharge status: Home / Self Care | Payer: 59 | Source: Ambulatory Visit | Attending: Internal Medicine | Admitting: Internal Medicine

## 2021-09-12 DIAGNOSIS — E041 Nontoxic single thyroid nodule: Secondary | ICD-10-CM

## 2021-09-19 ENCOUNTER — Telehealth: Payer: Self-pay

## 2021-09-19 NOTE — Telephone Encounter (Signed)
Pt called to get her results from Thyroid US. I advised pt the result note that Dr. Jonny Ruiz states The test results show that your current treatment is OK, as the ultrasound shows the nodules but overall no significant change from the last.  We should plan to repeat in 1 year.  ?  ?There is no other need for change of treatment or further evaluation based on these results, at this time.  thanks ? ?Pt understood and had no further questions ? ?FYI ?

## 2021-10-01 ENCOUNTER — Ambulatory Visit: Payer: 59 | Admitting: Sports Medicine

## 2021-10-01 ENCOUNTER — Telehealth (HOSPITAL_COMMUNITY): Payer: Self-pay | Admitting: Nurse Practitioner

## 2021-10-01 VITALS — BP 130/80 | Ht 65.0 in | Wt 225.0 lb

## 2021-10-01 DIAGNOSIS — M1812 Unilateral primary osteoarthritis of first carpometacarpal joint, left hand: Secondary | ICD-10-CM | POA: Diagnosis not present

## 2021-10-01 NOTE — Telephone Encounter (Signed)
Patient called and cancelled Myoview for reason below: ? ?09/24/2021 8:55 AM ER:XVQM, JENNIFER L  ?Cancel Rsn: Patient (still having back pain) ? ?Order will be removed from active WQ and when pt calls back to schedule we will reinstate the order.  ?

## 2021-10-01 NOTE — Progress Notes (Signed)
? ?  Subjective:  ? ? Patient ID: [name], female    DOB: [birth year], 61 y.o.   MRN: [id number] ? ?HPI chief complaint: Bilateral thumb pain ? ?[name] presents today with returning left thumb pain that she localizes to the [facility] joint.  Previous cortisone injection in this area was temporarily helpful.  Pain is now returned as she has started doing more gardening.  Right thumb is not quite as painful.  She is left-hand dominant. ? ?Interim medical history reviewed ?Medications reviewed ?Allergies reviewed ? ? ? ?Review of Systems ?As above ?   ?Objective:  ? Physical Exam ? ?Well-developed, well-nourished.  No acute distress ? ?Left thumb: There is some mild swelling at the [facility] joint.  Tenderness to palpation here as well.  Positive CMC grind.  Negative Finkelstein's.  Brisk capillary refill. ? ? ?   ?Assessment & Plan:  ? ?Left thumb pain secondary to [facility] osteoarthritis ? ?I would like to refer the patient to Dr. Tempie Donning for treatment.  He will likely start with updated x-rays and possibly a repeat cortisone injection.  However, I would like for [name] to hear what other options exist just in case repeat cortisone injection is ineffective.  She will follow-up with me as needed. ? ?This note was dictated using Dragon naturally speaking software and may contain errors in syntax, spelling, or content which have not been identified prior to signing this note.  ?

## 2021-10-04 ENCOUNTER — Ambulatory Visit (HOSPITAL_COMMUNITY): Payer: 59

## 2021-10-04 ENCOUNTER — Encounter: Payer: 59 | Admitting: Podiatry

## 2021-10-05 ENCOUNTER — Ambulatory Visit (HOSPITAL_COMMUNITY): Payer: 59

## 2021-10-08 ENCOUNTER — Ambulatory Visit (HOSPITAL_BASED_OUTPATIENT_CLINIC_OR_DEPARTMENT_OTHER): Payer: 59 | Admitting: Family

## 2021-10-09 ENCOUNTER — Encounter: Payer: Self-pay | Admitting: Orthopedic Surgery

## 2021-10-09 ENCOUNTER — Ambulatory Visit: Payer: Self-pay

## 2021-10-09 ENCOUNTER — Ambulatory Visit: Payer: 59 | Admitting: Orthopedic Surgery

## 2021-10-09 VITALS — BP 136/84 | HR 74 | Ht 65.0 in | Wt 225.0 lb

## 2021-10-09 DIAGNOSIS — M79645 Pain in left finger(s): Secondary | ICD-10-CM

## 2021-10-09 DIAGNOSIS — M1812 Unilateral primary osteoarthritis of first carpometacarpal joint, left hand: Secondary | ICD-10-CM

## 2021-10-09 MED ORDER — BETAMETHASONE SOD PHOS & ACET 6 (3-3) MG/ML IJ SUSP
3.0000 mg | INTRAMUSCULAR | Status: AC | PRN
  Administered 2021-10-09: 3 mg via INTRA_ARTICULAR

## 2021-10-09 MED ORDER — LIDOCAINE HCL 1 % IJ SOLN
0.5000 mL | INTRAMUSCULAR | Status: AC | PRN
  Administered 2021-10-09: .5 mL

## 2021-10-09 NOTE — Progress Notes (Signed)
? ?Office Visit Note ?  ?Patient: [name]           ?Date of Birth: 1961-03-01           ?MRN: [id number] ?Visit Date: 10/09/2021 ?             ?Requested by: Biagio Borg, MD ?[place],  [place] [address] ?PCP: Biagio Borg, MD ? ? ?Assessment & Plan: ?Visit Diagnoses:  ?1. Pain of left thumb   ?2. Arthritis of carpometacarpal (CMC) joint of left thumb   ? ? 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  We discussed the nature of thumb CMC arthritis as well as its diagnosis, prognosis and both surgical and conservative treatment options.  After our discussion, she would like to proceed with a repeat corticosteroid injection.  I will see her back in the office when her symptoms return or in 6 weeks if she does not get any symptom relief. ? ?Follow-Up Instructions: No follow-ups on file.  ? ?Orders:  ?Orders Placed This Encounter  ?Procedures  ? XR Finger Thumb Left  ? ?No orders of the defined types were placed in this encounter. ? ? ? ? Procedures: ?Hand/UE Inj: L thumb CMC for osteoarthritis on 10/09/2021 2:44 PM ?Indications: therapeutic and pain ?Details: 25 G needle, fluoroscopy-guided radial approach ?Medications: 0.5 mL lidocaine 1 %; 3 mg betamethasone acetate-betamethasone sodium phosphate 6 (3-3) MG/ML ?Procedure, treatment alternatives, risks and benefits explained, specific risks discussed. Consent was given by the patient. Immediately prior to procedure a time out was called to verify the correct patient, procedure, equipment, support staff and site/side marked as required. Patient was prepped and draped in the usual sterile fashion.  ? ? ? ? ?Clinical Data: ?No additional findings. ? ? ?Subjective: ?Chief Complaint  ?Patient presents with  ? Left Thumb - Pain  ?  Onset x a while now, PAIN: 11/10, + n/t, weakness, swelling, LEFT handed  ? ? ?This is a 61 year old left-hand-dominant female  presents with pain at the base of her left thumb.  Is been going on for several years now.  The pain is localized to the area of the [facility] joint.  Its worse with certain activities such as gardening, painting, or writing.  She says her pain is 10/10 at worst.  She is not able to tolerate many medications and has not taken anything for her pain so far.  She did have a corticosteroid injection last year which she notes gave her significant relief.  She has not tried any immobilization.  She denies pain at the MP joint.  She denies pain at the area of the radial styloid. ? ? ?Review of Systems ? ? ?Objective: ?Vital Signs: BP 136/84 (BP Location: Left Arm, Patient Position: Sitting)   Pulse 74   Ht 5\' 5"  (1.651 m)   Wt 225 lb (102.1 kg)   BMI 37.44 kg/m?  ? ?Physical Exam ?Constitutional:   ?   Appearance: Normal appearance.  ?Cardiovascular:  ?   Rate and Rhythm: Normal rate.  ?   Pulses: Normal pulses.  ?Pulmonary:  ?   Effort: Pulmonary effort is normal.  ?Skin: ?   General: Skin is warm and dry.  ?   Capillary Refill: Capillary refill takes less than 2 seconds.  ?Neurological:  ?   Mental Status: She is alert.  ? ? ?Left Hand Exam  ? ?Tenderness  ?  Left hand tenderness location: TTP at thumb [facility] joint w/ mild to moderate swelling.  ? ?Other  ?Erythema: absent ?Sensation: normal ?Pulse: present ? ?Comments:  Positive CMC grind test w/ pain and crepitus.  No static or dynamic MP hyper-extension.  No palmar abduction contractures.  No TTP at radial styloid.  No thumb IP triggering.  ? ? ? ? ?Specialty Comments:  ?No specialty comments available. ? ?Imaging: ?No results found. ? ? ?PMFS History: ?Patient Active Problem List  ? Diagnosis Date Noted  ? Arthritis of carpometacarpal [facility]) joint of left thumb 10/09/2021  ? Thyroid nodule 09/06/2021  ? COVID-19 virus infection 02/22/2021  ? Fatigue 10/11/2020  ? Occipital neuralgia of left side 09/14/2020  ? Postlaminectomy syndrome 09/14/2020  ? SI (sacroiliac) pain  09/14/2020  ? URI (upper respiratory infection) 08/20/2020  ? Thrush 08/20/2020  ? History of laparoscopic-assisted vaginal hysterectomy 08/17/2020  ? History of angioedema 08/17/2020  ? Obesity 08/17/2020  ? Visual impairment 11/09/2019  ? Rash 11/05/2019  ? Goiter 10/18/2019  ? Tenosynovitis of thumb 09/22/2019  ? Angioedema 04/21/2019  ? Blurred vision 05/20/2018  ? Allergic rhinitis 05/20/2018  ? Degenerative spondylolisthesis 03/03/2018  ? Preop exam for internal medicine 02/18/2018  ? Shingles 02/09/2018  ? 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 Tinea cruris 01/05/2018  ? Contact dermatitis 12/24/2017  ? Surgical menopause 03/05/2017  ? Vitamin D deficiency 03/05/2017  ? Dizziness 06/22/2016  ? Nausea without vomiting 06/22/2016  ? Prediabetes 02/29/2016  ? Dyslipidemia 02/29/2016  ? Dyspnea on exertion 04/15/2015  ? Hypertension, uncontrolled 04/10/2015  ? Delayed gastric emptying 12/16/2012  ? Chronic pain 05/08/2012  ? Muscle spasm of back 05/08/2012  ? Chest pain 05/08/2012  ? Gastroparesis 05/22/2011  ? Encounter for well adult exam with abnormal findings 05/18/2011  ? HYPERSOMNIA 04/04/2010  ? GERD 01/16/2009  ? IBS 01/16/2009  ? Osteoarthritis 01/16/2009  ? ANEMIA-IRON DEFICIENCY 01/03/2007  ? Anxiety state 01/03/2007  ? DEPRESSION 01/03/2007  ? ?Past Medical History:  ?Diagnosis Date  ? Anemia   ? ANEMIA-IRON DEFICIENCY 01/03/2007  ? Anxiety   ? ANXIETY 01/03/2007  ? Complication of anesthesia   ? severe post-operative nausea except for bladder surgery in 2002 at [place] not have complications then  ? Depression   ? DEPRESSION 01/03/2007  ? DJD (degenerative joint disease)   ? knee  ? Enlarged thyroid 10/18/2019  ? Family history of adverse reaction to anesthesia   ? mother and father both had severe post-operative nausea and vomiting  ? GERD 01/16/2009  ? GERD (gastroesophageal reflux disease)   ? Headache(784.0) 01/03/2007  ? Hyperplastic colon polyp   ? 04/2010  ? HYPERSOMNIA 04/04/2010  ?  Hypertension   ? IBS 01/16/2009  ? IBS (irritable bowel syndrome)   ? Impaired glucose tolerance 05/18/2011  ? Menopause   ? early  ? Migraine   ? Obesity   ? 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 PONV (postoperative nausea and vomiting)   ? Wheezing 07/02/2010  ?  ?Family History  ?Problem Relation Age of Onset  ? Hypertension Mother   ? Diabetes Brother   ?  ?Past Surgical History:  ?Procedure Laterality Date  ? ABDOMINAL HYSTERECTOMY    ? APPENDECTOMY    ? bladder tac    ? CHOLECYSTECTOMY    ? KNEE ARTHROSCOPY Bilateral   ? OOPHORECTOMY    ? ?Social History  ? ?Occupational History  ? Occupation: Housewife  ?  Employer: UNEMPLOYED  ?Tobacco Use  ?  Smoking status: Never  ? Smokeless tobacco: Never  ?Vaping Use  ? Vaping Use: Never used  ?Substance and Sexual Activity  ? Alcohol use: No  ?  Alcohol/week: 0.0 standard drinks  ? Drug use: No  ? Sexual activity: Never  ?  Birth control/protection: Abstinence  ? ? ? ? ? ? ?

## 2021-11-05 ENCOUNTER — Ambulatory Visit (HOSPITAL_BASED_OUTPATIENT_CLINIC_OR_DEPARTMENT_OTHER): Payer: 59 | Admitting: Cardiovascular Disease

## 2021-11-05 ENCOUNTER — Encounter (HOSPITAL_BASED_OUTPATIENT_CLINIC_OR_DEPARTMENT_OTHER): Payer: Self-pay | Admitting: Cardiovascular Disease

## 2021-11-05 DIAGNOSIS — I10 Essential (primary) hypertension: Secondary | ICD-10-CM

## 2021-11-05 DIAGNOSIS — E785 Hyperlipidemia, unspecified: Secondary | ICD-10-CM

## 2021-11-05 DIAGNOSIS — R7303 Prediabetes: Secondary | ICD-10-CM | POA: Diagnosis not present

## 2021-11-05 DIAGNOSIS — Z6838 Body mass index (BMI) 38.0-38.9, adult: Secondary | ICD-10-CM

## 2021-11-05 DIAGNOSIS — E6609 Other obesity due to excess calories: Secondary | ICD-10-CM

## 2021-11-05 NOTE — Assessment & Plan Note (Signed)
BP well controlled. Continue losartan  ?

## 2021-11-05 NOTE — Assessment & Plan Note (Signed)
She is appropriately started on rosuvastatin.  She sees her PCP next week and will have labs re-checked. ?

## 2021-11-05 NOTE — Assessment & Plan Note (Signed)
Keep working on diet and exercise.  She was prescribed Ozempic and then Rybelsus but decided not to take them due to her thyroid nodule. ?

## 2021-11-05 NOTE — Patient Instructions (Addendum)
Medication Instructions:  ?Your physician recommends that you continue on your current medications as directed. Please refer to the Current Medication list given to you today.  ?*If you need a refill on your cardiac medications before your next appointment, please call your pharmacy* ? ?Lab Work: ?NONE ? ?Testing/Procedures: ?NONE ? ?Follow-Up: ?At [facility], you and your health needs are our priority.  As part of our continuing mission to provide you with exceptional heart care, we have created designated Provider Care Teams.  These Care Teams include your primary Cardiologist (physician) and Advanced Practice Providers (APPs -  Physician Assistants and Nurse Practitioners) who all work together to provide you with the care you need, when you need it. ? ?We recommend signing up for the patient portal called "MyChart".  Sign up information is provided on this After Visit Summary.  MyChart is used to connect with patients for Virtual Visits (Telemedicine).  Patients are able to view lab/test results, encounter notes, upcoming appointments, etc.  Non-urgent messages can be sent to your provider as well.   ?To learn more about what you can do with MyChart, go to [email or web address].   ? ?Your next appointment:   ?12 month(s) ? ?The format for your next appointment:   ?In Person ? ?Provider:   ?Chilton Si, MD  ? ?You have been referred to  ? ?Ambulatory referral to [facility] Quillian Quince, MD) ?Where: [facility] ?Address: [address] [place] [address] ?Phone: [phone] ? ? ?

## 2021-11-05 NOTE — Progress Notes (Signed)
? ? ?Cardiology Office Note ? ? ?Date:  11/05/2021  ? ?ID:  [name], DOB January 25, 1961, MRN [id number] ? ?PCP:  Biagio Borg, MD  ?Cardiologist:   Skeet Latch, MD  ? ?No chief complaint on file. ? ? ?History of Present Illness: ?[name] is a 61 y.o. female with hypertension, hyperlipidemia, gastroesophageal reflux disease and depression who presents for follow up.  [name] was referred 04/2015 for a complaint of dizziness and dyspnea on exertion.  EKG showed sinus rhythm with low voltage and poor R wave progression.  BNP was 17.  The episodes occurred with exertion and were associated with a flushed feeling.  Her symptoms were felt to be neurocardiogenic and she was asked to wear compression stockings (also to help with her varicose veins) and increase her fluid intake.  Lab testing for pheochromocytoma and carcinoid were negative.  She underwent Lexiscan Cardiolite 04/2015 that was negative for ischemia and echocardiogram 05/2015 revealed grade 1 diastolic dysfunction.  Her blood pressure was intermittently elevated. She reported having a lot of anxiety and stress at the time. She was instructed to follow-up with her PCP or psychiatrist to discuss her anxiety.  Her blood pressure remained elevated so she was started on lisinopril.  This was switched to losartan due to cough. She reported lower extremity numbness and claudication.  She was referred for ABIs 01/30/17 that were normal bilaterally. Her thyroid was enlarged and she was referred for thyroid ultrasound which revealed multiple bilateral nodules.  She underwent thyroid biopsy which was consistent with benign follicular nodule and it was recommended that she have serial ultrasounds.  ? ?At her last appointment, her blood pressure was mildly elevated but no changes were made because it was well controlled at home. Today, she is doing alright overall. She underwent an ablation last Friday and her back pain and R SI joint has been bothering  her. The R hip pain diffuses into her R leg. Her movement is limited and moving or standing is difficult for her. The pain also prevents her from sleeping well. She uses Icy-Hot and pain killers like Tylenol to mitigate the pain. Her blood pressure has been high recently which she attributes to the pain. She reports measurements from the doctor in the [id number] systolic. However, she does not check her blood pressure at home. She also reports chest tightness which she attributes to the high blood pressure. The first episode occurred a few weeks ago when she was in severe leg pain. The latest episode occurred yesterday while she was shopping with her son. However, she did not feel the tightness this morning. She has been unable to exercise with the most she can tolerate is 7000 steps. She denies any palpitations, or shortness of breath, lightheadedness, headaches, syncope, orthopnea, PND, or lower extremity edema. ? ?She reported chest tightness that she attributed to elevated blood pressures.  Losartan was increased but her blood pressures remained high.  She has been tracking her BP at home and it is 120-130s.70s.  She has increased her exercise and is getting more stops.  She has no exertional chest pain or shortness of breath.  She was previously referred for a Lexiscan Myoview but it was not performed.  Lately she has been more active.  She is doing gardening and also going for walks.  She is limited by pain in her foot and her SI joint.  She has upcoming injections for both.  She was prescribed Ozempic but stopped it due  to concern about thyroid disease.  She struggles with sleeping at night because of her chronic pain. ? ?Past Medical History:  ?Diagnosis Date  ? Anemia   ? ANEMIA-IRON DEFICIENCY 01/03/2007  ? Anxiety   ? ANXIETY 01/03/2007  ? Complication of anesthesia   ? severe post-operative nausea except for bladder surgery in 2002 at [place] not have complications then  ? Depression   ? DEPRESSION  01/03/2007  ? DJD (degenerative joint disease)   ? knee  ? Enlarged thyroid 10/18/2019  ? Family history of adverse reaction to anesthesia   ? mother and father both had severe post-operative nausea and vomiting  ? GERD 01/16/2009  ? GERD (gastroesophageal reflux disease)   ? Headache(784.0) 01/03/2007  ? Hyperplastic colon polyp   ? 04/2010  ? HYPERSOMNIA 04/04/2010  ? Hypertension   ? IBS 01/16/2009  ? IBS (irritable bowel syndrome)   ? Impaired glucose tolerance 05/18/2011  ? Menopause   ? early  ? Migraine   ? Obesity   ? 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 PONV (postoperative nausea and vomiting)   ? Wheezing 07/02/2010  ? ? ?Past Surgical History:  ?Procedure Laterality Date  ? ABDOMINAL HYSTERECTOMY    ? APPENDECTOMY    ? bladder tac    ? CHOLECYSTECTOMY    ? KNEE ARTHROSCOPY Bilateral   ? OOPHORECTOMY    ? ? ? ?Current Outpatient Medications  ?Medication Sig Dispense Refill  ? acetaminophen (TYLENOL) 500 MG tablet Take 1,000 mg by mouth daily as needed for moderate pain or headache.    ? cetirizine (ZYRTEC) 10 MG tablet Take 1 tablet (10 mg total) by mouth daily. 30 tablet 11  ? Cholecalciferol (THERA-D 2000) 50 MCG (2000 UT) TABS 1 tab by mouth once daily (Patient taking differently: 3,000 Units. 1 tab by mouth twice daily) 30 tablet 99  ? esomeprazole (NEXIUM) 20 MG capsule Take 20 mg by mouth daily.    ? estradiol (ESTRACE) 0.5 MG tablet Take 0.5 mg by mouth daily.    ? gabapentin (NEURONTIN) 100 MG capsule Take 2 capsules (200 mg total) by mouth 3 (three) times daily as needed. 180 capsule 5  ? hydrocortisone 2.5 % cream hydrocortisone 2.5 % topical cream    ? ketoconazole (NIZORAL) 2 % cream ketoconazole 2 % topical cream    ? losartan (COZAAR) 100 MG tablet Take 1 tablet (100 mg total) by mouth daily. 90 tablet 3  ? Probiotic CAPS Take 1 capsule by mouth daily.    ? promethazine (PHENERGAN) 25 MG tablet Take 25 mg by mouth daily as needed for nausea or vomiting.    ? rosuvastatin (CRESTOR) 10 MG  tablet Take 1 tablet (10 mg total) by mouth daily. 90 tablet 3  ? ?No current facility-administered medications for this visit.  ? ? ?Allergies:   Meperidine, Morphine, Penicillins, Azithromycin, Lisinopril, Sucralfate, Doxycycline, Gabapentin, Other, Tyloxapol, Amoxicillin, Codeine, Hydrocodone, Omeprazole, Oxycodone, Oxycodone-acetaminophen, and Tramadol  ? ? ?Social History:  The patient  reports that she has never smoked. She has never used smokeless tobacco. She reports that she does not drink alcohol and does not use drugs.  ? ?Family History:  The patient's family history includes Diabetes in her brother; Hypertension in her mother.  ? ? ?ROS:  Please see the history of present illness.  ?(+) Back pain   ?(+) R hip pain ?(+) Chest tightness ? All other systems are reviewed and negative.  ? ? ?PHYSICAL EXAM: ?VS:  BP 118/74 (BP  Location: Right Arm, Patient Position: Sitting, Cuff Size: Large)   Pulse 65   Ht 5\' 5"  (1.651 m)   Wt 235 lb 8 oz (106.8 kg)   SpO2 95%   BMI 39.19 kg/m?  , BMI Body mass index is 39.19 kg/m?. ?GENERAL:  Well appearing ?HEENT: Pupils equal round and reactive, fundi not visualized, oral mucosa unremarkable ?NECK:  No jugular venous distention, waveform within normal limits, carotid upstroke brisk and symmetric, no bruits, + thyromegaly ?LUNGS:  Clear to auscultation bilaterally ?HEART:  RRR.  PMI not displaced or sustained,S1 and S2 within normal limits, no S3, no S4, no clicks, no rubs, no murmurs ?ABD:  Flat, positive bowel sounds normal in frequency in pitch, no bruits, no rebound, no guarding, no midline pulsatile mass, no hepatomegaly, no splenomegaly ?EXT:  2 plus radial pulses.  1+ DP/PT bilaterally.  No edema, no cyanosis no clubbing ?SKIN:  No rashes no nodules ?NEURO:  Cranial nerves II through XII grossly intact, motor grossly intact throughout ?PSYCH:  Cognitively intact, oriented to person place and time ? ?EKG:   ?04/26/21: Sinus bradycardia, rate 59 bpm. Low  voltage ?09/29/19: Sinus rhythm.  Rate 66 bpm.  Low voltage.  Poor  R wave progression. ?02/25/19: Sinus bradycardia.  Rate 53 bpm.  Nonspecific T wave abnormalities. ?02/24/2018: Sinus rhythm.  Rate 67 bpm. ?05/08/17: Sinus rhy

## 2021-11-05 NOTE — Assessment & Plan Note (Signed)
She was congratulated on increasing her exercise.  She struggles to lose weight.  We will refer her to the healthy weight and wellness clinic. ?

## 2021-11-14 ENCOUNTER — Ambulatory Visit (HOSPITAL_BASED_OUTPATIENT_CLINIC_OR_DEPARTMENT_OTHER): Payer: 59 | Admitting: Cardiovascular Disease

## 2021-12-06 ENCOUNTER — Telehealth: Payer: Self-pay | Admitting: *Deleted

## 2021-12-06 ENCOUNTER — Telehealth: Payer: Self-pay | Admitting: Internal Medicine

## 2021-12-06 ENCOUNTER — Encounter: Payer: Self-pay | Admitting: Internal Medicine

## 2021-12-06 ENCOUNTER — Ambulatory Visit: Payer: 59 | Admitting: Orthopedic Surgery

## 2021-12-06 ENCOUNTER — Ambulatory Visit: Payer: 59 | Admitting: Internal Medicine

## 2021-12-06 VITALS — BP 124/66 | HR 66 | Temp 97.6°F | Ht 65.0 in | Wt 229.0 lb

## 2021-12-06 DIAGNOSIS — E7800 Pure hypercholesterolemia, unspecified: Secondary | ICD-10-CM

## 2021-12-06 DIAGNOSIS — R7303 Prediabetes: Secondary | ICD-10-CM

## 2021-12-06 DIAGNOSIS — E559 Vitamin D deficiency, unspecified: Secondary | ICD-10-CM | POA: Diagnosis not present

## 2021-12-06 DIAGNOSIS — Z0001 Encounter for general adult medical examination with abnormal findings: Secondary | ICD-10-CM | POA: Diagnosis not present

## 2021-12-06 DIAGNOSIS — M1812 Unilateral primary osteoarthritis of first carpometacarpal joint, left hand: Secondary | ICD-10-CM | POA: Diagnosis not present

## 2021-12-06 DIAGNOSIS — I10 Essential (primary) hypertension: Secondary | ICD-10-CM

## 2021-12-06 DIAGNOSIS — E538 Deficiency of other specified B group vitamins: Secondary | ICD-10-CM

## 2021-12-06 DIAGNOSIS — E785 Hyperlipidemia, unspecified: Secondary | ICD-10-CM | POA: Insufficient documentation

## 2021-12-06 LAB — CBC WITH DIFFERENTIAL/PLATELET
Basophils Absolute: 0 10*3/uL (ref 0.0–0.1)
Basophils Relative: 0.5 % (ref 0.0–3.0)
Eosinophils Absolute: 0.2 10*3/uL (ref 0.0–0.7)
Eosinophils Relative: 3 % (ref 0.0–5.0)
HCT: 42.3 % (ref 36.0–46.0)
Hemoglobin: 13.8 g/dL (ref 12.0–15.0)
Lymphocytes Relative: 28.8 % (ref 12.0–46.0)
Lymphs Abs: 2.4 10*3/uL (ref 0.7–4.0)
MCHC: 32.7 g/dL (ref 30.0–36.0)
MCV: 90.3 fl (ref 78.0–100.0)
Monocytes Absolute: 0.5 10*3/uL (ref 0.1–1.0)
Monocytes Relative: 6.4 % (ref 3.0–12.0)
Neutro Abs: 5 10*3/uL (ref 1.4–7.7)
Neutrophils Relative %: 61.3 % (ref 43.0–77.0)
Platelets: 190 10*3/uL (ref 150.0–400.0)
RBC: 4.68 Mil/uL (ref 3.87–5.11)
RDW: 13.6 % (ref 11.5–15.5)
WBC: 8.2 10*3/uL (ref 4.0–10.5)

## 2021-12-06 LAB — HEPATIC FUNCTION PANEL
ALT: 26 U/L (ref 0–35)
AST: 23 U/L (ref 0–37)
Albumin: 4.2 g/dL (ref 3.5–5.2)
Alkaline Phosphatase: 57 U/L (ref 39–117)
Bilirubin, Direct: 0.1 mg/dL (ref 0.0–0.3)
Total Bilirubin: 0.6 mg/dL (ref 0.2–1.2)
Total Protein: 7.3 g/dL (ref 6.0–8.3)

## 2021-12-06 LAB — HEMOGLOBIN A1C: Hgb A1c MFr Bld: 7.7 % — ABNORMAL HIGH (ref 4.6–6.5)

## 2021-12-06 LAB — BASIC METABOLIC PANEL
BUN: 18 mg/dL (ref 6–23)
CO2: 25 mEq/L (ref 19–32)
Calcium: 9.7 mg/dL (ref 8.4–10.5)
Chloride: 105 mEq/L (ref 96–112)
Creatinine, Ser: 0.79 mg/dL (ref 0.40–1.20)
GFR: 80.3 mL/min (ref >60.00)
Glucose, Bld: 154 mg/dL — ABNORMAL HIGH (ref 70–99)
Potassium: 3.9 mEq/L (ref 3.5–5.1)
Sodium: 138 mEq/L (ref 135–145)

## 2021-12-06 LAB — TSH: TSH: 1.01 u[IU]/mL (ref 0.35–5.50)

## 2021-12-06 LAB — LIPID PANEL
Cholesterol: 153 mg/dL (ref 0–200)
HDL: 60.5 mg/dL (ref >39.00)
LDL Cholesterol: 66 mg/dL (ref 0–99)
NonHDL: 92.28
Total CHOL/HDL Ratio: 3
Triglycerides: 129 mg/dL (ref 0.0–149.0)
VLDL: 25.8 mg/dL (ref 0.0–40.0)

## 2021-12-06 LAB — VITAMIN B12: Vitamin B-12: 342 pg/mL (ref 211–911)

## 2021-12-06 LAB — VITAMIN D 25 HYDROXY (VIT D DEFICIENCY, FRACTURES): VITD: 35.68 ng/mL (ref 30.00–100.00)

## 2021-12-06 MED ORDER — MELOXICAM 7.5 MG PO TABS
7.5000 mg | ORAL_TABLET | Freq: Every day | ORAL | 0 refills | Status: DC
Start: 2021-12-06 — End: 2022-01-07

## 2021-12-06 MED ORDER — TIRZEPATIDE 2.5 MG/0.5ML ~~LOC~~ SOAJ: 2.5000 mg | SUBCUTANEOUS | 11 refills | Status: DC

## 2021-12-06 NOTE — Assessment & Plan Note (Signed)
Last vitamin D Lab Results  Component Value Date   VD25OH 23.95 (L) 05/28/2021   Low, to start oral replacement

## 2021-12-06 NOTE — Patient Instructions (Addendum)
Please take all new medication as prescribed - the mounjaro  Please continue all other medications as before, and refills have been done if requested.  Please have the pharmacy call with any other refills you may need.  Please continue your efforts at being more active, low cholesterol diet, and weight control.  You are otherwise up to date with prevention measures today.  Please keep your appointments with your specialists as you may have planned  Please go to the LAB at the blood drawing area for the tests to be done  You will be contacted by phone if any changes need to be made immediately.  Otherwise, you will receive a letter about your results with an explanation, but please check with MyChart first.  Please remember to sign up for MyChart if you have not done so, as this will be important to you in the future with finding out test results, communicating by private email, and scheduling acute appointments online when needed.  Please make an Appointment to return in 6 months, or sooner if needed

## 2021-12-06 NOTE — Telephone Encounter (Signed)
Pt called in and states tirzepatide [facility]) 2.5 MG/0.5ML Pen needs a PA.   Requesting that it be completed ASAP and called into the pharmacy:   [id number] - [place], [place] - [address] Phone:  [phone]  Fax:  [phone]

## 2021-12-06 NOTE — Telephone Encounter (Signed)
Pt called in and stated she spoke with insurance and they said if a diabetes diagnoses is noted then they will approve the medication.

## 2021-12-06 NOTE — Telephone Encounter (Signed)
Ok for [id number].[email or web address]

## 2021-12-06 NOTE — Progress Notes (Signed)
Office Visit Note   Patient: [name]           Date of Birth: 1960-12-20           MRN: [id number] Visit Date: 12/06/2021              Requested by: Corwin Levins, MD [address] [place],  [place] [address] PCP: Corwin Levins, MD   Assessment & Plan: Visit Diagnoses:  1. Arthritis of carpometacarpal (CMC) joint of left thumb     Plan: Patient had significant relief for many weeks after corticosteroid injection.  We again reviewed conservative and surgical treatment options for thumb CMC arthritis. We will try an oral anti-inflammatory medication and a brace.  I can see her back again as needed.   Follow-Up Instructions: No follow-ups on file.   Orders:  No orders of the defined types were placed in this encounter.  No orders of the defined types were placed in this encounter.     Procedures: No procedures performed   Clinical Data: No additional findings.   Subjective: No chief complaint on file.   This is a 61 year old left-hand-dominant female who presents for follow-up of left thumb CMC arthritis.  She was last seen approximately 2 months ago at which time we did a corticosteroid junction to the [facility] joint.  She had significant relief for many weeks.  The pain has returned.  She has been doing a great deal of gardening and planting recently and has severe pain at the thumb CMC joint.  She is only taking Tylenol for her pain.  She is not wearing any form of immobilization.    Review of Systems   Objective: Vital Signs: There were no vitals taken for this visit.  Physical Exam  Left Hand Exam   Tenderness  Left hand tenderness location: TTP at thumb CMC joint.   Other  Erythema: absent Sensation: normal Pulse: present  Comments:  Positive CMC grind test with pain and crepitus.  No static or dynamic MP hyperextension.  No abduction contractures of the thumb.      Specialty Comments:  No specialty comments available.  Imaging: No  results found.   PMFS History: Patient Active Problem List   Diagnosis Date Noted   Hyperlipidemia 12/06/2021   Arthritis of carpometacarpal [facility]) joint of left thumb 10/09/2021   Thyroid nodule 09/06/2021   Sinus tarsitis, left 03/15/2021   COVID-19 virus infection 02/22/2021   Extensor tendonitis of foot 01/11/2021   Hereditary and idiopathic peripheral neuropathy 01/11/2021   Fatigue 10/11/2020   Occipital neuralgia of left side 09/14/2020   Postlaminectomy syndrome 09/14/2020   SI (sacroiliac) pain 09/14/2020   URI (upper respiratory infection) 08/20/2020   Thrush 08/20/2020   History of laparoscopic-assisted vaginal hysterectomy 08/17/2020   History of angioedema 08/17/2020   Obesity 08/17/2020   Visual impairment 11/09/2019   Rash 11/05/2019   Goiter 10/18/2019   Tenosynovitis of thumb 09/22/2019   Angioedema 04/21/2019   Blurred vision 05/20/2018   Allergic rhinitis 05/20/2018   Degenerative spondylolisthesis 03/03/2018   Preop exam for internal medicine 02/18/2018   Shingles 02/09/2018   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   Tinea cruris 01/05/2018   Contact dermatitis 12/24/2017   Surgical menopause 03/05/2017   Vitamin D deficiency 03/05/2017   Dizziness 06/22/2016   Nausea without vomiting 06/22/2016   Prediabetes 02/29/2016   Dyslipidemia 02/29/2016   Dyspnea on exertion 04/15/2015   Hypertension, uncontrolled 04/10/2015   Delayed gastric emptying 12/16/2012  Chronic pain 05/08/2012   Muscle spasm of back 05/08/2012   Chest pain 05/08/2012   Gastroparesis 05/22/2011   Encounter for well adult exam with abnormal findings 05/18/2011   HYPERSOMNIA 04/04/2010   GERD 01/16/2009   IBS 01/16/2009   Osteoarthritis 01/16/2009   ANEMIA-IRON DEFICIENCY 01/03/2007   Anxiety state 01/03/2007   DEPRESSION 01/03/2007   Past Medical History:  Diagnosis Date   Anemia    ANEMIA-IRON DEFICIENCY 01/03/2007   Anxiety    ANXIETY 01/03/2007   Complication of  anesthesia    severe post-operative nausea except for bladder surgery in 2002 at [facility]-did not have complications then   Depression    DEPRESSION 01/03/2007   DJD (degenerative joint disease)    knee   Enlarged thyroid 10/18/2019   Family history of adverse reaction to anesthesia    mother and father both had severe post-operative nausea and vomiting   GERD 01/16/2009   GERD (gastroesophageal reflux disease)    Headache(784.0) 01/03/2007   Hyperplastic colon polyp    04/2010   HYPERSOMNIA 04/04/2010   Hypertension    IBS 01/16/2009   IBS (irritable bowel syndrome)    Impaired glucose tolerance 05/18/2011   Menopause    early   Migraine    Obesity    OSTEOARTHRITIS, KNEES, 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   PONV (postoperative nausea and vomiting)    Wheezing 07/02/2010    Family History  Problem Relation Age of Onset   Hypertension Mother    Diabetes Brother     Past Surgical History:  Procedure Laterality Date   ABDOMINAL HYSTERECTOMY     APPENDECTOMY     bladder tac     CHOLECYSTECTOMY     KNEE ARTHROSCOPY Bilateral    OOPHORECTOMY     Social History   Occupational History   Occupation: Engineer, site: UNEMPLOYED  Tobacco Use   Smoking status: Never   Smokeless tobacco: Never  Vaping Use   Vaping Use: Never used  Substance and Sexual Activity   Alcohol use: No    Alcohol/week: 0.0 standard drinks of alcohol   Drug use: No   Sexual activity: Never    Birth control/protection: Abstinence

## 2021-12-06 NOTE — Telephone Encounter (Signed)
Pt was on cover-my-meds needing PA for [facility]. Submitted w/ (Key: [id number]) Rec'd msg " Your information has been sent to [facility]"..[name]

## 2021-12-06 NOTE — Progress Notes (Unsigned)
Patient ID: [name], female   DOB: 05-19-1961, 61 y.o.   MRN: [id number]         Chief Complaint:: wellness exam and Follow-up Low vit d, obesity, hld, htn, hyperglycemia       HPI:  [name] is a 61 y.o. female here for wellness exam; delcines optho referral as she plans to see her eye doctor soon; ow up to date                        Also has new endo appt but not until oct 2023.  Pt denies chest pain, increased sob or doe, wheezing, orthopnea, PND, increased LE swelling, palpitations, dizziness or syncope.   Pt denies polydipsia, polyuria, or new focal neuro s/s.    Pt denies fever, wt loss, night sweats, loss of appetite, or other constitutional symptoms  Denies worsening depressive symptoms, suicidal ideation, or panic; has ongoing anxiety, Not taking Vit D.  Willing to try mounjaro for wt loss as she cannot seem to lose with exercise, diet.  Tolerating the increased crestor 40 mg.     Wt Readings from Last 3 Encounters:  12/06/21 229 lb (103.9 kg)  11/05/21 235 lb 8 oz (106.8 kg)  10/09/21 225 lb (102.1 kg)   BP Readings from Last 3 Encounters:  12/06/21 124/66  11/05/21 118/74  10/09/21 136/84   Immunization History  Administered Date(s) Administered   Influenza Split 03/28/2011, 04/02/2012   Influenza Whole 04/04/2010   Influenza,inj,Quad PF,6+ Mos 03/19/2013, 04/01/2014, 04/01/2014, 03/24/2015, 03/05/2017, 04/09/2018, 03/31/2019, 03/10/2020, 03/08/2021   Influenza-Unspecified 03/18/2016   Moderna Sars-Covid-2 Vaccination 09/02/2019, 10/05/2019   Pneumococcal Conjugate-13 05/25/2019   Pneumococcal Polysaccharide-23 05/31/2020   Td 01/16/2009   Tdap 03/05/2017   Zoster Recombinat (Shingrix) 05/31/2020, 08/10/2020   There are no preventive care reminders to display for this patient.     Past Medical History:  Diagnosis Date   Anemia    ANEMIA-IRON DEFICIENCY 01/03/2007   Anxiety    ANXIETY 01/03/2007   Complication of anesthesia    severe post-operative  nausea except for bladder surgery in 2002 at [facility]-did not have complications then   Depression    DEPRESSION 01/03/2007   DJD (degenerative joint disease)    knee   Enlarged thyroid 10/18/2019   Family history of adverse reaction to anesthesia    mother and father both had severe post-operative nausea and vomiting   GERD 01/16/2009   GERD (gastroesophageal reflux disease)    Headache(784.0) 01/03/2007   Hyperplastic colon polyp    04/2010   HYPERSOMNIA 04/04/2010   Hypertension    IBS 01/16/2009   IBS (irritable bowel syndrome)    Impaired glucose tolerance 05/18/2011   Menopause    early   Migraine    Obesity    OSTEOARTHRITIS, KNEES, 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 She has never used smokeless tobacco. She reports that she does not drink alcohol and does not use drugs. family history includes Diabetes in her brother; Hypertension in her mother. Allergies  Allergen Reactions   Meperidine Shortness Of Breath   Morphine Anaphylaxis   Penicillins Hives,  Nausea Only, Swelling, Other (See Comments), Anaphylaxis and Nausea And Vomiting    PATIENT HAS HAD A PCN REACTION WITH IMMEDIATE RASH, FACIAL/TONGUE/THROAT SWELLING, SOB, OR LIGHTHEADEDNESS WITH HYPOTENSION:  #  #  YES  #  #  HAS PT DEVELOPED SEVERE RASH INVOLVING MUCUS MEMBRANES or SKIN NECROSIS: #  #  YES  #  #  PATIENT HAS HAD A PCN REACTION THAT REQUIRED HOSPITALIZATION:  #  #  YES  #  #  Has patient had a PCN reaction occurring within the last 10 years: No  vomiting PATIENT HAS HAD A PCN REACTION WITH IMMEDIATE RASH, FACIAL/TONGUE/THROAT SWELLING, SOB, OR LIGHTHEADEDNESS WITH HYPOTENSION:  #  #  YES  #  #  HAS PT DEVELOPED SEVERE RASH INVOLVING  MUCUS MEMBRANES or SKIN NECROSIS: #  #  YES  #  #  PATIENT HAS HAD A PCN REACTION THAT REQUIRED HOSPITALIZATION:  #  #  YES  #  #  Has patient had a PCN reaction occurring within the last 10 years: No   Azithromycin Nausea And Vomiting and Nausea Only   Lisinopril Cough        Sucralfate Rash and Other (See Comments)    Blurry vision  Blurry vision     Doxycycline Nausea Only    nausea   Gabapentin Other (See Comments)    Muscle pain, fatigue   Other    Tyloxapol Other (See Comments)   Amoxicillin Nausea Only   Codeine Nausea Only and Other (See Comments)    Hot flashes also   Hydrocodone Itching, Nausea Only and Rash   Omeprazole Itching and Rash   Oxycodone Nausea Only, Rash and Nausea And Vomiting   Oxycodone-Acetaminophen Nausea And Vomiting and Rash   Tramadol Itching, Nausea Only and Rash   Current Outpatient Medications on File Prior to Visit  Medication Sig Dispense Refill   acetaminophen (TYLENOL) 500 MG tablet Take 1,000 mg by mouth daily as needed for moderate pain or headache.     Cholecalciferol (THERA-D 2000) 50 MCG (2000 UT) TABS 1 tab by mouth once daily (Patient taking differently: 3,000 Units. 1 tab by mouth twice daily) 30 tablet 99   esomeprazole (NEXIUM) 20 MG capsule Take 20 mg by mouth daily.     estradiol (ESTRACE) 0.5 MG tablet Take 0.5 mg by mouth daily.     gabapentin (NEURONTIN) 100 MG capsule Take 2 capsules (200 mg total) by mouth 3 (three) times daily as needed. 180 capsule 5   hydrocortisone 2.5 % cream hydrocortisone 2.5 % topical cream     ketoconazole (NIZORAL) 2 % cream ketoconazole 2 % topical cream     losartan (COZAAR) 100 MG tablet Take 1 tablet (100 mg total) by mouth daily. 90 tablet 3   Probiotic CAPS Take 1 capsule by mouth daily.     promethazine (PHENERGAN) 25 MG tablet Take 25 mg by mouth daily as needed for nausea or vomiting.     rosuvastatin (CRESTOR) 10 MG tablet Take 1 tablet (10 mg total) by mouth daily. 90 tablet 3    cetirizine (ZYRTEC) 10 MG tablet Take 1 tablet (10 mg total) by mouth daily. 30 tablet 11   No current facility-administered medications on file prior to visit.        ROS:  All others reviewed and negative.  Objective        PE:  BP 124/66 (BP Location: Right Arm, Patient Position: Sitting, Cuff Size: Large)   Pulse 66   Temp  97.6 F (36.4 C) (Oral)   Ht 5\' 5"  (1.651 m)   Wt 229 lb (103.9 kg)   SpO2 96%   BMI 38.11 kg/m                 Constitutional: Pt appears in NAD               HENT: Head: NCAT.                Right Ear: External ear normal.                 Left Ear: External ear normal.                Eyes: . Pupils are equal, round, and reactive to light. Conjunctivae and EOM are normal               Nose: without d/c or deformity               Neck: Neck supple. Gross normal ROM               Cardiovascular: Normal rate and regular rhythm.                 Pulmonary/Chest: Effort normal and breath sounds without rales or wheezing.                Abd:  Soft, NT, ND, + BS, no organomegaly               Neurological: Pt is alert. At baseline orientation, motor grossly intact               Skin: Skin is warm. No rashes, no other new lesions, LE edema - none               Psychiatric: Pt behavior is normal without agitation   Micro: none  Cardiac tracings I have personally interpreted today:  none  Pertinent Radiological findings (summarize): none   Lab Results  Component Value Date   WBC 8.2 12/06/2021   HGB 13.8 12/06/2021   HCT 42.3 12/06/2021   PLT 190.0 12/06/2021   GLUCOSE 154 (H) 12/06/2021   CHOL 153 12/06/2021   TRIG 129.0 12/06/2021   HDL 60.50 12/06/2021   LDLDIRECT 124.0 05/26/2020   LDLCALC 66 12/06/2021   ALT 26 12/06/2021   AST 23 12/06/2021   NA 138 12/06/2021   K 3.9 12/06/2021   CL 105 12/06/2021   CREATININE 0.79 12/06/2021   BUN 18 12/06/2021   CO2 25 12/06/2021   TSH 1.01 12/06/2021   HGBA1C 7.7 (H) 12/06/2021   MICROALBUR <0.7  05/28/2021   Assessment/Plan:  ANSHI JALLOH is a 61 y.o. White or Caucasian [1] female with  has a past medical history of Anemia, ANEMIA-IRON DEFICIENCY (01/03/2007), Anxiety, ANXIETY (01/03/2007), Complication of anesthesia, Depression, DEPRESSION (01/03/2007), DJD (degenerative joint disease), Enlarged thyroid (10/18/2019), Family history of adverse reaction to anesthesia, GERD (01/16/2009), GERD (gastroesophageal reflux disease), Headache(784.0) (01/03/2007), Hyperplastic colon polyp, HYPERSOMNIA (04/04/2010), Hypertension, IBS (01/16/2009), IBS (irritable bowel syndrome), Impaired glucose tolerance (05/18/2011), Menopause, Migraine, Obesity, OSTEOARTHRITIS, KNEES, 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  Vitamin D deficiency Last vitamin D Lab Results  Component Value Date   VD25OH 23.95 (L) 05/28/2021   Low, to start oral replacement   Encounter for well adult exam with abnormal findings Age and sex appropriate education and counseling updated with regular exercise and diet Referrals for preventative services - pt  to make eye appt soon Immunizations addressed - none needed Smoking counseling  - none needed Evidence for depression or other mood disorder - chronic anxiety no change Most recent labs reviewed. I have personally reviewed and have noted: 1) the patient's medical and social history 2) The patient's current medications and supplements 3) The patient's height, weight, and BMI have been recorded in the chart   Prediabetes Lab Results  Component Value Date   HGBA1C 7.7 (H) 12/06/2021   uncontrolled, pt to start mounjaro 2.5 mg weekly   Hypertension, uncontrolled BP Readings from Last 3 Encounters:  12/06/21 124/66  11/05/21 118/74  10/09/21 136/84   Stable, pt to continue medical treatment losartan 100 mg qd   Hyperlipidemia Lab Results  Component Value Date   LDLCALC 66 12/06/2021   Stable, pt to continue  current statin crestor 10 mg  Followup: Return in about 6 months (around 06/07/2022).  Oliver Barre, MD 12/08/2021 10:09 PM [facility]

## 2021-12-07 LAB — URINALYSIS, ROUTINE W REFLEX MICROSCOPIC
Bilirubin Urine: NEGATIVE
Ketones, ur: NEGATIVE
Nitrite: NEGATIVE
Specific Gravity, Urine: 1.025 (ref 1.000–1.030)
Urine Glucose: NEGATIVE
Urobilinogen, UA: 0.2 (ref 0.0–1.0)
pH: 6 (ref 5.0–8.0)

## 2021-12-08 ENCOUNTER — Encounter: Payer: Self-pay | Admitting: Internal Medicine

## 2021-12-08 NOTE — Assessment & Plan Note (Signed)
Age and sex appropriate education and counseling updated with regular exercise and diet Referrals for preventative services - pt to make eye appt soon Immunizations addressed - none needed Smoking counseling  - none needed Evidence for depression or other mood disorder - chronic anxiety no change Most recent labs reviewed. I have personally reviewed and have noted: 1) the patient's medical and social history 2) The patient's current medications and supplements 3) The patient's height, weight, and BMI have been recorded in the chart

## 2021-12-08 NOTE — Assessment & Plan Note (Signed)
Lab Results  Component Value Date   HGBA1C 7.7 (H) 12/06/2021   uncontrolled, pt to start mounjaro 2.5 mg weekly

## 2021-12-08 NOTE — Assessment & Plan Note (Signed)
BP Readings from Last 3 Encounters:  12/06/21 124/66  11/05/21 118/74  10/09/21 136/84   Stable, pt to continue medical treatment losartan 100 mg qd

## 2021-12-08 NOTE — Assessment & Plan Note (Signed)
Lab Results  Component Value Date   LDLCALC 66 12/06/2021   Stable, pt to continue current statin crestor 10 mg

## 2021-12-10 NOTE — Telephone Encounter (Signed)
Rec'd determination fax med was DENIED..It states This request has received an Unfavorable outcome.This request was denied because you did not meet the following clinical requirements for [facility].Marland KitchenRaechel Chute

## 2021-12-10 NOTE — Telephone Encounter (Signed)
Are you able to initiate a PA for [facility]?

## 2021-12-10 NOTE — Telephone Encounter (Signed)
Patient is requesting call back regarding the PA for [facility]. Call back # [phone]

## 2021-12-11 NOTE — Telephone Encounter (Signed)
Someone has started PA for [employer] w/ Key: [id number].  Rec'd msg " This request has received an Unfavorable outcome." This request was denied because you did not meet the following clinical requirements. This medicine is covered only if: All of the following: (1) You have a diagnosis of type 2 diabetes mellitus confirmed by accepted laboratory testing methodologies per treatment guidelines (for example, A1C greater than or equal to 6.5%, fasting plasma glucose greater than or equal to 126mg /dL and/or 2-hour plasma glucose greater than or equal to 200mg /dL). (2) The use of the medication is not intended solely for the purpose of weight loss. The information provided does not show that you meet the criteria listed above.

## 2022-01-06 ENCOUNTER — Other Ambulatory Visit: Payer: Self-pay | Admitting: Orthopedic Surgery

## 2022-01-29 ENCOUNTER — Ambulatory Visit: Payer: 59 | Admitting: Sports Medicine

## 2022-01-29 VITALS — BP 161/70 | Ht 65.0 in

## 2022-01-29 DIAGNOSIS — M170 Bilateral primary osteoarthritis of knee: Secondary | ICD-10-CM

## 2022-01-29 MED ORDER — METHYLPREDNISOLONE ACETATE 40 MG/ML IJ SUSP
40.0000 mg | Freq: Once | INTRAMUSCULAR | Status: AC
Start: 2022-01-29 — End: 2022-01-29
  Administered 2022-01-29: 40 mg via INTRA_ARTICULAR

## 2022-01-29 MED ORDER — METHYLPREDNISOLONE ACETATE 40 MG/ML IJ SUSP
40.0000 mg | Freq: Once | INTRAMUSCULAR | Status: AC
  Administered 2022-01-29: 40 mg via INTRA_ARTICULAR

## 2022-01-30 NOTE — Progress Notes (Signed)
   Subjective:    Patient ID: [name], female    DOB: 08-Jul-1960, 61 y.o.   MRN: [id number]  HPI chief complaint: Bilateral knee pain  [name] presents today with returning bilateral knee pain.  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  In fact, it has been a couple of years since her last injections.  Her pain has now returned to the level that she would like to have repeat injections today.  No recent trauma.  Interim medical history reviewed Medications reviewed Allergies reviewed    Review of Systems As above    Objective:   Physical Exam  Well-developed, well-nourished.  No acute distress  Examination of both knees shows fairly good range of motion.  She does have about a 2 to 3 degree extension lag on the right.  No obvious effusion.  Fairly good alignment.  Negative McMurray's.  Neurovascularly intact distally.      Assessment & Plan:   Returning bilateral knee pain secondary to DJD  Each of [name]'s knees were injected today with cortisone.  This was accomplished atraumatically under sterile technique utilizing an anterior lateral approach.  She tolerates this without difficulty.  Last set of x-rays of the right knee in 2020 showed bone-on-bone DJD.  Although her arthritis is quite advanced, she is not yet ready to consider arthroplasty.  She will follow-up as needed.  Consent obtained and verified. Time-out conducted. Noted no overlying erythema, induration, or other signs of local infection. Skin prepped in a sterile fashion. Topical analgesic spray: Ethyl chloride. Joint: Left knee, right knee Needle: 25-gauge 1.5 inch Completed without difficulty. Meds: 3 cc 1% Xylocaine, 1 cc (40 mg) injected into each knee  Advised to call if fevers/chills, erythema, induration, drainage, or persistent bleeding.   This note was dictated using Dragon naturally speaking software and may contain  errors in syntax, spelling, or content which have not been identified prior to signing this note.

## 2022-03-01 ENCOUNTER — Ambulatory Visit (INDEPENDENT_AMBULATORY_CARE_PROVIDER_SITE_OTHER): Payer: 59

## 2022-03-01 DIAGNOSIS — Z23 Encounter for immunization: Secondary | ICD-10-CM | POA: Diagnosis not present

## 2022-03-05 ENCOUNTER — Ambulatory Visit: Payer: 59 | Admitting: Sports Medicine

## 2022-03-05 VITALS — BP 134/80 | Ht 65.0 in

## 2022-03-05 DIAGNOSIS — M2550 Pain in unspecified joint: Secondary | ICD-10-CM | POA: Diagnosis not present

## 2022-03-05 DIAGNOSIS — M25562 Pain in left knee: Secondary | ICD-10-CM | POA: Diagnosis not present

## 2022-03-05 MED ORDER — METHYLPREDNISOLONE ACETATE 80 MG/ML IJ SUSP
80.0000 mg | Freq: Once | INTRAMUSCULAR | Status: AC
Start: 2022-03-05 — End: 2022-03-05
  Administered 2022-03-05: 80 mg via INTRAMUSCULAR

## 2022-03-05 NOTE — Progress Notes (Addendum)
   Subjective:    Patient ID: [name], female    DOB: July 17, 1960, 61 y.o.   MRN: [id number]  HPI chief complaint: Left leg pain  Shalini comes in today complaining of left leg pain.  This is different than the pain she is experienced in the past with her left knee osteoarthritis.  She describes a deep pain in the distal lateral thigh as well as the lateral lower leg.  Her pain is most noticeable with weightbearing and walking.  No pain with sitting.  This results in some instability.  She has tried some Voltaren gel and Tylenol but it has not been helpful.  She has a history of chronic low back pain status post surgery which she sees Dr. Allena Katz for pain management at [facility].  Her next appointment with him is next month.    Review of Systems As above    Objective:   Physical Exam   Well-developed, well-nourished.  No acute distress.  Left leg: Negative straight leg raise.  Reflexes are trace but equal at the Achilles and patellar tendons bilaterally.  Strength is 5/5.  No atrophy.  Good pulses.     Assessment & Plan:   Left leg pain-question lumbar radiculopathy History of arthritis in multiple joints-rule out systemic arthropathy  80 mg of Depo-Medrol IM.  Her symptoms sound neuropathic and may be originating from her lumbar spine.  I asked her to discuss this with Dr. Allena Katz next month.  In the past she has tried a low-dose of gabapentin at night which has been helpful.  She does not tolerate high doses of gabapentin however.  I recommended that she trial a low-dose of gabapentin (100 mg at night) for 2 to 3 days to see if that makes a difference in her symptoms.  She can then report that to Dr. Allena Katz.  I also think we should get some blood work to rule out systemic arthropathy as a cause for her arthritis.  She does report a positive response to oral steroids and she has not had blood work done in the past.  I will follow-up with her with those results when available.  This  note was dictated using Dragon naturally speaking software and may contain errors in syntax, spelling, or content which have not been identified prior to signing this note.

## 2022-03-07 LAB — COMPREHENSIVE METABOLIC PANEL
ALT: 28 IU/L (ref 0–32)
AST: 23 IU/L (ref 0–40)
Albumin/Globulin Ratio: 1.6 (ref 1.2–2.2)
Albumin: 4.5 g/dL (ref 3.9–4.9)
Alkaline Phosphatase: 69 IU/L (ref 44–121)
BUN/Creatinine Ratio: 10 — ABNORMAL LOW (ref 12–28)
BUN: 8 mg/dL (ref 8–27)
Bilirubin Total: 0.4 mg/dL (ref 0.0–1.2)
CO2: 22 mmol/L (ref 20–29)
Calcium: 10.2 mg/dL (ref 8.7–10.3)
Chloride: 101 mmol/L (ref 96–106)
Creatinine, Ser: 0.78 mg/dL (ref 0.57–1.00)
Globulin, Total: 2.8 g/dL (ref 1.5–4.5)
Glucose: 195 mg/dL — ABNORMAL HIGH (ref 70–99)
Potassium: 4.5 mmol/L (ref 3.5–5.2)
Sodium: 141 mmol/L (ref 134–144)
Total Protein: 7.3 g/dL (ref 6.0–8.5)
eGFR: 86 mL/min/{1.73_m2} (ref >59)

## 2022-03-07 LAB — CBC
Hematocrit: 40.3 % (ref 34.0–46.6)
Hemoglobin: 13.6 g/dL (ref 11.1–15.9)
MCH: 29.7 pg (ref 26.6–33.0)
MCHC: 33.7 g/dL (ref 31.5–35.7)
MCV: 88 fL (ref 79–97)
Platelets: 212 10*3/uL (ref 150–450)
RBC: 4.58 x10E6/uL (ref 3.77–5.28)
RDW: 12.9 % (ref 11.7–15.4)
WBC: 9.8 10*3/uL (ref 3.4–10.8)

## 2022-03-07 LAB — SEDIMENTATION RATE: Sed Rate: 2 mm/hr (ref 0–40)

## 2022-03-07 LAB — ANA: Anti Nuclear Antibody (ANA): NEGATIVE

## 2022-03-07 LAB — RHEUMATOID FACTOR: Rheumatoid fact SerPl-aCnc: 11.3 IU/mL (ref <14.0)

## 2022-03-07 LAB — C-REACTIVE PROTEIN: CRP: 4 mg/L (ref 0–10)

## 2022-03-13 ENCOUNTER — Encounter: Payer: Self-pay | Admitting: Sports Medicine

## 2022-03-14 ENCOUNTER — Encounter: Payer: Self-pay | Admitting: Internal Medicine

## 2022-03-14 ENCOUNTER — Ambulatory Visit: Payer: 59 | Admitting: Internal Medicine

## 2022-03-14 VITALS — BP 126/84 | HR 72 | Temp 97.6°F | Ht 65.0 in | Wt 229.0 lb

## 2022-03-14 DIAGNOSIS — E1165 Type 2 diabetes mellitus with hyperglycemia: Secondary | ICD-10-CM | POA: Diagnosis not present

## 2022-03-14 DIAGNOSIS — R21 Rash and other nonspecific skin eruption: Secondary | ICD-10-CM

## 2022-03-14 DIAGNOSIS — K047 Periapical abscess without sinus: Secondary | ICD-10-CM | POA: Diagnosis not present

## 2022-03-14 MED ORDER — FLUCONAZOLE 100 MG PO TABS: 100.0000 mg | ORAL_TABLET | Freq: Every day | ORAL | 0 refills | Status: DC

## 2022-03-14 MED ORDER — TIRZEPATIDE 2.5 MG/0.5ML ~~LOC~~ SOAJ: 2.5000 mg | SUBCUTANEOUS | 11 refills | Status: DC

## 2022-03-14 NOTE — Patient Instructions (Signed)
Ok to try the [facility] for diabetes  Please take all new medication as prescribed - the diflucan for the rash  Please continue all other medications as before, and refills have been done if requested.  Please have the pharmacy call with any other refills you may need.  Please keep your appointments with your specialists as you may have planned  Please make an Appointment to return in 6 months, or sooner if needed

## 2022-03-14 NOTE — Progress Notes (Signed)
Patient ID: [name], female   DOB: 1961/01/15, 61 y.o.   MRN: [id number]        Chief Complaint: follow up HTN, obesity, groin rash, left ear pain       HPI:  [name] is a 61 y.o. female here with c/o 5 days onset left facial pain /left ear pain with swelling and soreness, just started cleocin per oral surgury x 2 days, and maybe starting to improve.  Pt denies chest pain, increased sob or doe, wheezing, orthopnea, PND, increased LE swelling, palpitations, dizziness or syncope.   Pt denies polydipsia, polyuria, or new focal neuro s/s.    Pt denies fever, wt loss, night sweats, loss of appetite, or other constitutional symptoms  Also has increased CBGs with infection but prior to that as well, working with endo, and pt states ozempic may not be working so well for her and being considered for mounjaro.  Also has large blateral groin erythem rash nontender, not well controlled with ketoconzole cr.  Had cortisone in jection to right hip last wk and now improved.  Has f/u with oral surgury planned sept 26.       Wt Readings from Last 3 Encounters:  03/14/22 229 lb (103.9 kg)  12/06/21 229 lb (103.9 kg)  11/05/21 235 lb 8 oz (106.8 kg)   BP Readings from Last 3 Encounters:  03/14/22 126/84  03/05/22 134/80  01/29/22 (!) 161/70         Past Medical History:  Diagnosis Date   Anemia    ANEMIA-IRON DEFICIENCY 01/03/2007   Anxiety    ANXIETY 1/82/9937   Complication of anesthesia    severe post-operative nausea except for bladder surgery in 2002 at [place] not have complications then   Depression    DEPRESSION 01/03/2007   DJD (degenerative joint disease)    knee   Enlarged thyroid 10/18/2019   Family history of adverse reaction to anesthesia    mother and father both had severe post-operative nausea and vomiting   GERD 01/16/2009   GERD (gastroesophageal reflux disease)    Headache(784.0) 01/03/2007   Hyperplastic colon polyp    04/2010   HYPERSOMNIA 04/04/2010    Hypertension    IBS 01/16/2009   IBS (irritable bowel syndrome)    Impaired glucose tolerance 05/18/2011   Menopause    early   Migraine    Obesity    OSTEOARTHRITIS, KNEES, 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 She has never used smokeless tobacco. She reports that she does not drink alcohol and does not use drugs. family history includes Diabetes in her brother; Hypertension in her mother. Allergies  Allergen Reactions   Meperidine Shortness Of Breath   Morphine Anaphylaxis   Penicillins Hives, Nausea Only, Swelling, Other (See Comments), Anaphylaxis and Nausea And Vomiting    PATIENT HAS HAD A PCN REACTION WITH IMMEDIATE RASH, FACIAL/TONGUE/THROAT SWELLING, SOB, OR LIGHTHEADEDNESS WITH HYPOTENSION:  #  #  YES  #  #  HAS PT DEVELOPED SEVERE RASH INVOLVING MUCUS MEMBRANES or SKIN NECROSIS: #  #  YES  #  #  PATIENT HAS HAD A PCN REACTION THAT REQUIRED HOSPITALIZATION:  #  #  YES  #  #  Has patient had a PCN reaction occurring within the last 10 years: No  vomiting PATIENT HAS HAD A PCN REACTION WITH IMMEDIATE RASH, FACIAL/TONGUE/THROAT SWELLING, SOB, OR LIGHTHEADEDNESS WITH HYPOTENSION:  #  #  YES  #  #  HAS PT DEVELOPED SEVERE RASH INVOLVING MUCUS MEMBRANES or SKIN NECROSIS: #  #  YES  #  #  PATIENT HAS HAD A PCN REACTION THAT REQUIRED HOSPITALIZATION:  #  #  YES  #  #  Has patient had a PCN reaction occurring within the last 10 years: No   Azithromycin Nausea And Vomiting and Nausea Only   Lisinopril Cough        Sucralfate Rash and Other (See Comments)    Blurry vision  Blurry vision     Doxycycline Nausea Only    nausea   Gabapentin Other (See Comments)    Muscle pain, fatigue   Other    Tyloxapol Other  (See Comments)   Amoxicillin Nausea Only   Codeine Nausea Only and Other (See Comments)    Hot flashes also   Hydrocodone Itching, Nausea Only and Rash   Omeprazole Itching and Rash   Oxycodone Nausea Only, Rash and Nausea And Vomiting   Oxycodone-Acetaminophen Nausea And Vomiting and Rash   Tramadol Itching, Nausea Only and Rash   Current Outpatient Medications on File Prior to Visit  Medication Sig Dispense Refill   acetaminophen (TYLENOL) 500 MG tablet Take 1,000 mg by mouth daily as needed for moderate pain or headache.     Cholecalciferol (THERA-D 2000) 50 MCG (2000 UT) TABS 1 tab by mouth once daily (Patient taking differently: 3,000 Units. 1 tab by mouth twice daily) 30 tablet 99   esomeprazole (NEXIUM) 20 MG capsule Take 20 mg by mouth daily.     estradiol (ESTRACE) 0.5 MG tablet Take 0.5 mg by mouth daily.     gabapentin (NEURONTIN) 100 MG capsule Take 2 capsules (200 mg total) by mouth 3 (three) times daily as needed. 180 capsule 5   hydrocortisone 2.5 % cream hydrocortisone 2.5 % topical cream     ketoconazole (NIZORAL) 2 % cream ketoconazole 2 % topical cream     losartan (COZAAR) 100 MG tablet Take 1 tablet (100 mg total) by mouth daily. 90 tablet 3   meloxicam (MOBIC) 7.5 MG tablet TAKE 1 TABLET BY MOUTH EVERY DAY 30 tablet 0   Probiotic CAPS Take 1 capsule by mouth daily.     promethazine (PHENERGAN) 25 MG tablet Take 25 mg by mouth daily as needed for nausea or vomiting.     rosuvastatin (CRESTOR) 10 MG tablet Take 1 tablet (10 mg total) by mouth daily. 90 tablet 3   cetirizine (ZYRTEC) 10 MG tablet Take 1 tablet (10 mg total) by mouth daily. 30 tablet 11   No current facility-administered medications on file prior to visit.        ROS:  All others reviewed and negative.  Objective        PE:  BP 126/84   Pulse 72   Temp 97.6 F (36.4 C)   Ht 5\' 5"  (1.651 m)   Wt 229 lb (103.9 kg)   SpO2 97%   BMI 38.11 kg/m                 Constitutional: Pt appears in NAD                HENT: Head: NCAT.  Right Ear: External ear normal.                 Left Ear: External ear normal left canal normal, but has nondiscrete trace to 1+diffuse swelling to left facies and upper neck                Eyes: . Pupils are equal, round, and reactive to light. Conjunctivae and EOM are normal               Nose: without d/c or deformity               Neck: Neck supple. Gross normal ROM               Cardiovascular: Normal rate and regular rhythm.                 Pulmonary/Chest: Effort normal and breath sounds without rales or wheezing.                Abd:  Soft, NT, ND, + BS, no organomegaly               Neurological: Pt is alert. At baseline orientation, motor grossly intact               Skin: Skin is warm. Bilat groin nontender erythem rash, LE edema - none               Psychiatric: Pt behavior is normal without agitation   Micro: none  Cardiac tracings I have personally interpreted today:  none  Pertinent Radiological findings (summarize): none   Lab Results  Component Value Date   WBC 9.8 03/06/2022   HGB 13.6 03/06/2022   HCT 40.3 03/06/2022   PLT 212 03/06/2022   GLUCOSE 195 (H) 03/06/2022   CHOL 153 12/06/2021   TRIG 129.0 12/06/2021   HDL 60.50 12/06/2021   LDLDIRECT 124.0 05/26/2020   LDLCALC 66 12/06/2021   ALT 28 03/06/2022   AST 23 03/06/2022   NA 141 03/06/2022   K 4.5 03/06/2022   CL 101 03/06/2022   CREATININE 0.78 03/06/2022   BUN 8 03/06/2022   CO2 22 03/06/2022   TSH 1.01 12/06/2021   HGBA1C 7.7 (H) 12/06/2021   MICROALBUR <0.7 05/28/2021   Assessment/Plan:  [name] is a 61 y.o. White or Caucasian [1] female with  has a past medical history of Anemia, ANEMIA-IRON DEFICIENCY (01/03/2007), Anxiety, ANXIETY ([id number]), Complication of anesthesia, Depression, DEPRESSION (01/03/2007), DJD (degenerative joint disease), Enlarged thyroid (10/18/2019), Family history of adverse reaction to anesthesia, GERD (01/16/2009), GERD  (gastroesophageal reflux disease), Headache(784.0) (01/03/2007), Hyperplastic colon polyp, HYPERSOMNIA (04/04/2010), Hypertension, IBS (01/16/2009), IBS (irritable bowel syndrome), Impaired glucose tolerance (05/18/2011), Menopause, Migraine, Obesity, OSTEOARTHRITIS, KNEES, 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  Diabetes ([place]) Lab Results  Component Value Date   HGBA1C 7.7 (H) 12/06/2021   Mild uncontrolled, pt to start mounjaro 2.5 mg weekly  Dental infection Mild to mod, pt encouraged to continue and finish cleocin,,  to f/u any worsening symptoms or concerns  Rash Worsening not well controlled with ketoconozole cr - for diflucan 100 qd x 7 days  Followup: No follow-ups on file.  Cathlean Cower, MD 03/16/2022 7:32 PM [facility]

## 2022-03-16 DIAGNOSIS — K047 Periapical abscess without sinus: Secondary | ICD-10-CM | POA: Insufficient documentation

## 2022-03-16 NOTE — Assessment & Plan Note (Signed)
Worsening not well controlled with ketoconozole cr - for diflucan 100 qd x 7 days

## 2022-03-16 NOTE — Assessment & Plan Note (Addendum)
Lab Results  Component Value Date   HGBA1C 7.7 (H) 12/06/2021   Mild uncontrolled, pt to start mounjaro 2.5 mg weekly

## 2022-03-16 NOTE — Assessment & Plan Note (Signed)
Mild to mod, pt encouraged to continue and finish cleocin,,  to f/u any worsening symptoms or concerns

## 2022-03-18 ENCOUNTER — Telehealth: Payer: Self-pay | Admitting: Internal Medicine

## 2022-03-18 DIAGNOSIS — R221 Localized swelling, mass and lump, neck: Secondary | ICD-10-CM

## 2022-03-18 NOTE — Telephone Encounter (Signed)
PT calls today following up from last visit. PT was in for neck pain and swollen lymph nodes. PT stated she is still experiencing severe pain, having issues with laying down. They state that Dr.John was suggesting potentially having PT do a CT of the neck but PT is not sure what her options are since she is allergic to the dye contrast.   Can we get PT a referral out for a CT to inspect neck pain/lymph nodes and will they be able to cater to her being allergic to the dye?  CB if needed: [phone]

## 2022-03-20 DIAGNOSIS — R150 Incomplete defecation: Secondary | ICD-10-CM | POA: Insufficient documentation

## 2022-03-20 NOTE — Telephone Encounter (Signed)
Ok I ordered the CT neck but no contrast due to the dye allergy

## 2022-03-20 NOTE — Telephone Encounter (Signed)
Per patient,"Can we get PT a referral out for a CT to inspect neck pain/lymph nodes and will they be able to cater to her being allergic to the dye?"

## 2022-03-20 NOTE — Telephone Encounter (Signed)
Patient informed but is still waiting to be scheduled.

## 2022-03-29 ENCOUNTER — Ambulatory Visit (HOSPITAL_COMMUNITY): Payer: 59

## 2022-03-29 ENCOUNTER — Telehealth: Payer: Self-pay

## 2022-03-29 NOTE — Telephone Encounter (Signed)
PA started for [facility] through CoverMyMeds.  Key: [id number]

## 2022-04-01 NOTE — Telephone Encounter (Signed)
PA has been APPROVED through 03/30/2023

## 2022-04-08 ENCOUNTER — Encounter (INDEPENDENT_AMBULATORY_CARE_PROVIDER_SITE_OTHER): Payer: 59 | Admitting: Internal Medicine

## 2022-04-15 ENCOUNTER — Encounter: Payer: Self-pay | Admitting: Internal Medicine

## 2022-04-15 ENCOUNTER — Ambulatory Visit: Payer: 59 | Admitting: Internal Medicine

## 2022-04-15 VITALS — BP 124/80 | HR 71 | Ht 65.0 in | Wt 230.0 lb

## 2022-04-15 DIAGNOSIS — E042 Nontoxic multinodular goiter: Secondary | ICD-10-CM

## 2022-04-15 DIAGNOSIS — E1142 Type 2 diabetes mellitus with diabetic polyneuropathy: Secondary | ICD-10-CM

## 2022-04-15 DIAGNOSIS — E1165 Type 2 diabetes mellitus with hyperglycemia: Secondary | ICD-10-CM | POA: Diagnosis not present

## 2022-04-15 LAB — POCT GLYCOSYLATED HEMOGLOBIN (HGB A1C): Hemoglobin A1C: 8.2 % — AB (ref 4.0–5.6)

## 2022-04-15 LAB — POCT GLUCOSE (DEVICE FOR HOME USE): Glucose Fasting, POC: 195 mg/dL — AB (ref 70–99)

## 2022-04-15 MED ORDER — ONETOUCH VERIO VI STRP: 1.0000 | ORAL_STRIP | Freq: Every day | 3 refills | Status: DC

## 2022-04-15 MED ORDER — TIRZEPATIDE 2.5 MG/0.5ML ~~LOC~~ SOAJ: 2.5000 mg | SUBCUTANEOUS | 2 refills | Status: DC

## 2022-04-15 NOTE — Progress Notes (Signed)
Name: [name]  MRN/ DOB: [id number], 09-15-1960   Age/ Sex: 61 y.o., female    PCP: Biagio Borg, MD   Reason for Endocrinology Evaluation: Type 2 Diabetes Mellitus     Date of Initial Endocrinology Visit: 04/15/2022     PATIENT IDENTIFIER: [name] is a 61 y.o. female with a past medical history of HTN, DM. The patient presented for initial endocrinology clinic visit on 04/15/2022 for consultative assistance with her diabetes management.    HPI: [name]  is here with her daughter    Diagnosed with DM 2019 with an A1c 2019 Prior Medications tried/Intolerance: n/a Currently checking blood sugars 0 x / day Hypoglycemia episodes : no               Hemoglobin A1c has ranged from 6.6% in 2019, peaking at 7.7% in 2023. Patient required assistance for hypoglycemia:  Patient has required hospitalization within the last 1 year from hyper or hypoglycemia:   In terms of diet, the patient eats a lot of sweets. Drinks a regular coke a day   Patient follows with gastroenterology for reflux and fecal incontinence, but has no issues at this time    Sees Cardiology  Sees  Ortho  Has an appointment with weight clinic next week  She receives prednisone    Has chronic feet pain and tingling   Has noted occasional left thyroid swellings   MULTINODULAR GOITER: She has been diagnosed with multinodular goiter based on thyroid ultrasound in 2021.  She is s/p benign FNA of the left inferior nodule in April 2021  She sees [facility] for estrogen management.     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  Denies: CKD Last eye exam: Completed 2022 Q 2 yrs   Macrovascular complications:   Denies: CAD, PVD, CVA   PAST HISTORY: Past Medical History:  Past Medical History:  Diagnosis  Date   Anemia    ANEMIA-IRON DEFICIENCY 01/03/2007   Anxiety    ANXIETY 9/62/9528   Complication of anesthesia    severe post-operative nausea except for bladder surgery in 2002 at [place] not have complications then   Depression    DEPRESSION 01/03/2007   DJD (degenerative joint disease)    knee   Enlarged thyroid 10/18/2019   Family history of adverse reaction to anesthesia    mother and father both had severe post-operative nausea and vomiting   GERD 01/16/2009   GERD (gastroesophageal reflux disease)    Headache(784.0) 01/03/2007   Hyperplastic colon polyp    04/2010   HYPERSOMNIA 04/04/2010   Hypertension    IBS 01/16/2009   IBS (irritable bowel syndrome)    Impaired glucose tolerance 05/18/2011   Menopause    early   Migraine    Obesity    OSTEOARTHRITIS, KNEES, 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 She has never used smokeless tobacco. She reports that she does not drink  alcohol and does not use drugs. Family History:  Family History  Problem Relation Age of Onset   Hypertension Mother    Diabetes Brother      HOME MEDICATIONS: Allergies as of 04/15/2022       Reactions   Meperidine Shortness Of Breath   Morphine Anaphylaxis   Penicillins Hives, Nausea Only, Swelling, Other (See Comments), Anaphylaxis, Nausea And Vomiting   PATIENT HAS HAD A PCN REACTION WITH IMMEDIATE RASH, FACIAL/TONGUE/THROAT SWELLING, SOB, OR LIGHTHEADEDNESS WITH HYPOTENSION:  #  #  YES  #  #  HAS PT DEVELOPED SEVERE RASH INVOLVING MUCUS MEMBRANES or SKIN NECROSIS: #  #  YES  #  #  PATIENT HAS HAD A PCN REACTION THAT REQUIRED HOSPITALIZATION:  #  #  YES  #  #  Has patient had a  PCN reaction occurring within the last 10 years: No vomiting PATIENT HAS HAD A PCN REACTION WITH IMMEDIATE RASH, FACIAL/TONGUE/THROAT SWELLING, SOB, OR LIGHTHEADEDNESS WITH HYPOTENSION:  #  #  YES  #  #  HAS PT DEVELOPED SEVERE RASH INVOLVING MUCUS MEMBRANES or SKIN NECROSIS: #  #  YES  #  #  PATIENT HAS HAD A PCN REACTION THAT REQUIRED HOSPITALIZATION:  #  #  YES  #  #  Has patient had a PCN reaction occurring within the last 10 years: No   Azithromycin Nausea And Vomiting, Nausea Only   Lisinopril Cough       Sucralfate Rash, Other (See Comments)   Blurry vision  Blurry vision    Doxycycline Nausea Only   nausea   Gabapentin Other (See Comments)   Muscle pain, fatigue   Other    Tyloxapol Other (See Comments)   Amoxicillin Nausea Only   Codeine Nausea Only, Other (See Comments)   Hot flashes also   Hydrocodone Itching, Nausea Only, Rash   Omeprazole Itching, Rash   Oxycodone Nausea Only, Rash, Nausea And Vomiting   Oxycodone-acetaminophen Nausea And Vomiting, Rash   Tramadol Itching, Nausea Only, Rash        Medication List        Accurate as of April 15, 2022  7:19 AM. If you have any questions, ask your nurse or doctor.          acetaminophen 500 MG tablet Commonly known as: TYLENOL Take 1,000 mg by mouth daily as needed for moderate pain or headache.   cetirizine 10 MG tablet Commonly known as: ZYRTEC Take 1 tablet (10 mg total) by mouth daily.   esomeprazole 20 MG capsule Commonly known as: NEXIUM Take 20 mg by mouth daily.   estradiol 0.5 MG tablet Commonly known as: ESTRACE Take 0.5 mg by mouth daily.   fluconazole 100 MG tablet Commonly known as: Diflucan Take 1 tablet (100 mg total) by mouth daily.   gabapentin 100 MG capsule Commonly known as: NEURONTIN Take 2 capsules (200 mg total) by mouth 3 (three) times daily as needed.   hydrocortisone 2.5 % cream hydrocortisone 2.5 % topical cream   ketoconazole 2 % cream Commonly known as:  NIZORAL ketoconazole 2 % topical cream   losartan 100 MG tablet Commonly known as: COZAAR Take 1 tablet (100 mg total) by mouth daily.   meloxicam 7.5 MG tablet Commonly known as: MOBIC TAKE 1 TABLET BY MOUTH EVERY DAY   Probiotic Caps Take 1 capsule by mouth daily.   promethazine 25 MG tablet Commonly known as: PHENERGAN Take 25 mg by mouth daily as needed for nausea or  vomiting.   rosuvastatin 10 MG tablet Commonly known as: Crestor Take 1 tablet (10 mg total) by mouth daily.   Thera-D 2000 50 MCG (2000 UT) Tabs Generic drug: Cholecalciferol 1 tab by mouth once daily What changed:  how much to take additional instructions   tirzepatide 2.5 MG/0.5ML Pen Commonly known as: MOUNJARO Inject 2.5 mg into the skin once a week.         ALLERGIES: Allergies  Allergen Reactions   Meperidine Shortness Of Breath   Morphine Anaphylaxis   Penicillins Hives, Nausea Only, Swelling, Other (See Comments), Anaphylaxis and Nausea And Vomiting    PATIENT HAS HAD A PCN REACTION WITH IMMEDIATE RASH, FACIAL/TONGUE/THROAT SWELLING, SOB, OR LIGHTHEADEDNESS WITH HYPOTENSION:  #  #  YES  #  #  HAS PT DEVELOPED SEVERE RASH INVOLVING MUCUS MEMBRANES or SKIN NECROSIS: #  #  YES  #  #  PATIENT HAS HAD A PCN REACTION THAT REQUIRED HOSPITALIZATION:  #  #  YES  #  #  Has patient had a PCN reaction occurring within the last 10 years: No  vomiting PATIENT HAS HAD A PCN REACTION WITH IMMEDIATE RASH, FACIAL/TONGUE/THROAT SWELLING, SOB, OR LIGHTHEADEDNESS WITH HYPOTENSION:  #  #  YES  #  #  HAS PT DEVELOPED SEVERE RASH INVOLVING MUCUS MEMBRANES or SKIN NECROSIS: #  #  YES  #  #  PATIENT HAS HAD A PCN REACTION THAT REQUIRED HOSPITALIZATION:  #  #  YES  #  #  Has patient had a PCN reaction occurring within the last 10 years: No   Azithromycin Nausea And Vomiting and Nausea Only   Lisinopril Cough        Sucralfate Rash and Other (See Comments)    Blurry vision  Blurry vision     Doxycycline  Nausea Only    nausea   Gabapentin Other (See Comments)    Muscle pain, fatigue   Other    Tyloxapol Other (See Comments)   Amoxicillin Nausea Only   Codeine Nausea Only and Other (See Comments)    Hot flashes also   Hydrocodone Itching, Nausea Only and Rash   Omeprazole Itching and Rash   Oxycodone Nausea Only, Rash and Nausea And Vomiting   Oxycodone-Acetaminophen Nausea And Vomiting and Rash   Tramadol Itching, Nausea Only and Rash     REVIEW OF SYSTEMS: A comprehensive ROS was conducted with the patient and is negative except as per HPI and below:  ROS    OBJECTIVE:   VITAL SIGNS: There were no vitals taken for this visit.   PHYSICAL EXAM:  General: Pt appears well and is in NAD  Neck: General: Supple without adenopathy or carotid bruits. Thyroid: Thyroid size normal.  No goiter or nodules appreciated.   Lungs: Clear with good BS bilat with no rales, rhonchi, or wheezes  Heart: RRR   Abdomen:  soft, nontender  Extremities:  Lower extremities - No pretibial edema. No lesions.  Skin: Normal texture and temperature to palpation. No rash noted.  Neuro: MS is good with appropriate affect, pt is alert and Ox3    DM foot exam:    DATA REVIEWED:  Lab Results  Component Value Date   HGBA1C 7.7 (H) 12/06/2021   HGBA1C 7.1 (H) 05/28/2021   HGBA1C 7.0 (H) 10/25/2020         Latest Reference Range & Units 12/06/21 09:56  Total CHOL/HDL Ratio  3  Cholesterol 0 - 200 mg/dL 153  HDL Cholesterol >39.00 mg/dL  60.50  LDL (calc) 0 - 99 mg/dL 66  NonHDL  92.28  Triglycerides 0.0 - 149.0 mg/dL 129.0  VLDL 0.0 - 40.0 mg/dL 25.8    Latest Reference Range & Units 03/06/22 09:36  Sodium 134 - 144 mmol/L 141  Potassium 3.5 - 5.2 mmol/L 4.5  Chloride 96 - 106 mmol/L 101  CO2 20 - 29 mmol/L 22  Glucose 70 - 99 mg/dL 195 (H)  BUN 8 - 27 mg/dL 8  Creatinine 0.57 - 1.00 mg/dL 0.78  Calcium 8.7 - 10.3 mg/dL 10.2  BUN/Creatinine Ratio 12 - 28  10 (L)  eGFR >59 mL/min/1.73 86   Alkaline Phosphatase 44 - 121 IU/L 69  Albumin 3.9 - 4.9 g/dL 4.5  Albumin/Globulin Ratio 1.2 - 2.2  1.6  AST 0 - 40 IU/L 23  ALT 0 - 32 IU/L 28  Total Protein 6.0 - 8.5 g/dL 7.3  Total Bilirubin 0.0 - 1.2 mg/dL 0.4   THYROID ULTRASOUND 09/12/2021  Estimated total number of nodules >/= 1 cm: 3   Number of spongiform nodules >/=  2 cm not described below (TR1): 0   Number of mixed cystic and solid nodules >/= 1.5 cm not described below (TR2): 0   _________________________________________________________   Nodule 1, superior right thyroid, spongiform and does not meet criteria for further surveillance.   Nodule 2, cystic, 6 mm, and does not meet criteria for further surveillance.   Nodule 3, right inferior thyroid, 9 mm and does not meet criteria for surveillance.   Nodule labeled 4 superior left thyroid, 6 mm, does not meet criteria for further surveillance.   Nodule labeled 5, 1.3 cm, slightly smaller than previous and remains TR 4. Nodule meets criteria for continued surveillance.   Nodule labeled 6, 1.6 cm, slightly decreased from the prior, previously biopsied. Assuming benign result, no further specific follow-up would be indicated.   No adenopathy   IMPRESSION: Multinodular thyroid again demonstrated.   Left mid thyroid nodule (labeled 5 on the current study, 1.3 cm, TR 4) meets criteria for continued surveillance, as designated by the newly established ACR TI-RADS criteria. Surveillance ultrasound study recommended to be performed annually up to 5 years.   FNA left inferior thyroid nodule 10/14/2019   Clinical History: None provided  Specimen Submitted:  A. THYROID,LEFT INFERIOR, FINE NEEDLE ASPIRATION:    FINAL MICROSCOPIC DIAGNOSIS:  - Consistent with benign follicular nodule (Bethesda category II)   ASSESSMENT / PLAN / RECOMMENDATIONS:   1) Type 2 Diabetes Mellitus, poorly controlled, With neuropathic  complications - Most recent A1c of 8.2 %. Goal  A1c < 7.0 %.     -Hyperglycemia is exacerbated by intermittent use of glucocorticoids -We discussed increase carbohydrate sensitivity with glucocorticoids and the importance of limiting CHO intake even more while on prednisone therapy -We also discussed the importance of avoiding sugar sweetened beverages, discussed low-carb snacks if necessary -She has been prescribed Mounjaro but has not started it yet, the patient does have occasional nausea, reflux issues as well as fecal incontinence, the symptoms have been stable, I have cautioned her to monitor the symptoms as they may worsen with Mounjaro -She is not a candidate for SGLT2 inhibitors as she has chronic intertrigo and genital infections that she attributes to prednisone use  MEDICATIONS: Start Mounjaro 2.5 mg weekly  EDUCATION / INSTRUCTIONS: BG monitoring instructions: Patient is instructed to check her blood sugars 2-3 times a week. Call [facility] if: BG persistently < 70  I reviewed the Rule of  15 for the treatment of hypoglycemia in detail with the patient. Literature supplied.   2) Diabetic complications:  Eye: Does not have known diabetic retinopathy.  Neuro/ Feet: Does have known diabetic peripheral neuropathy. Renal: Patient does not have known baseline CKD. She is  on an ACEI/ARB at present.  3) MULTINODULAR GOITER:  -No local neck symptoms -She is s/p benign FNA of the left thyroid nodule in 2021 -Repeat ultrasound earlier this year shows stability -Repeat next year   Signed electronically by: Mack Guise, MD  [facility]  [address] [place], [place] [address] Phone: [phone] FAX: [phone]   CC: Biagio Borg, [place] [address] Phone: [phone]  Fax: [phone]    Return to Endocrinology clinic as below: Future Appointments  Date Time Provider [place]  04/15/2022  9:30 AM  Montrell Cessna, Melanie Crazier, MD [facility] None  04/24/2022 11:20 AM Thomes Dinning, MD [facility] None  06/07/2022  9:40 AM Biagio Borg, MD [facility] None

## 2022-04-15 NOTE — Patient Instructions (Signed)
Start Mounjaro 2.5 mg weekly    HOW TO TREAT LOW BLOOD SUGARS (Blood sugar LESS THAN 70 MG/DL) Please follow the RULE OF 15 for the treatment of hypoglycemia treatment (when your (blood sugars are less than 70 mg/dL)   STEP 1: Take 15 grams of carbohydrates when your blood sugar is low, which includes:  3-4 GLUCOSE TABS  OR 3-4 OZ OF JUICE OR REGULAR SODA OR ONE TUBE OF GLUCOSE GEL    STEP 2: RECHECK blood sugar in 15 MINUTES STEP 3: If your blood sugar is still low at the 15 minute recheck --> then, go back to STEP 1 and treat AGAIN with another 15 grams of carbohydrates.

## 2022-04-24 ENCOUNTER — Ambulatory Visit (INDEPENDENT_AMBULATORY_CARE_PROVIDER_SITE_OTHER): Payer: 59 | Admitting: Internal Medicine

## 2022-04-24 ENCOUNTER — Encounter (INDEPENDENT_AMBULATORY_CARE_PROVIDER_SITE_OTHER): Payer: Self-pay | Admitting: Internal Medicine

## 2022-04-24 VITALS — BP 137/82 | HR 69 | Temp 97.6°F | Ht 65.0 in | Wt 222.8 lb

## 2022-04-24 DIAGNOSIS — E1169 Type 2 diabetes mellitus with other specified complication: Secondary | ICD-10-CM

## 2022-04-24 DIAGNOSIS — Z6837 Body mass index (BMI) 37.0-37.9, adult: Secondary | ICD-10-CM

## 2022-04-24 DIAGNOSIS — E669 Obesity, unspecified: Secondary | ICD-10-CM

## 2022-04-24 DIAGNOSIS — I10 Essential (primary) hypertension: Secondary | ICD-10-CM

## 2022-04-24 DIAGNOSIS — Z7985 Long-term (current) use of injectable non-insulin antidiabetic drugs: Secondary | ICD-10-CM

## 2022-04-24 DIAGNOSIS — Z0289 Encounter for other administrative examinations: Secondary | ICD-10-CM

## 2022-04-24 NOTE — Progress Notes (Signed)
Office: [phone]  /  Fax: [phone]   Initial Visit  [name] was seen in clinic today to evaluate for obesity. She is interested in losing weight to improve overall health and reduce the risk of weight related complications. She presents today to review program treatment options, initial physical assessment, and evaluation.     She was referred by: Specialist cardiologist  When asked what else they would like to accomplish? She states: Improve existing medical conditions, Reduce number of medications, and Improve quality of life  When asked how has your weight affected you? She states: Contributed to medical problems, Contributed to orthopedic problems or mobility issues, Having fatigue, Having poor endurance, and Problems with eating patterns  Some associated conditions: Hypertension, Hyperlipidemia, OSA, Diabetes, and Other: Acid reflux and vitamin D deficiency  Contributing factors: Family history, Disruption of circadian rhythm, Nutritional, Stress, Reduced physical activity, Eating patterns, Pregnancy, and Menopause  Weight promoting medications identified: Antiepileptics  Current nutrition plan: None  Current level of physical activity: None  Current or previous pharmacotherapy: GLP-1 + GIP  Response to medication: Other: Has only been on it for about a week   Past medical history includes:   Past Medical History:  Diagnosis Date   Anemia    ANEMIA-IRON DEFICIENCY 01/03/2007   Anxiety    ANXIETY 09/23/270   Complication of anesthesia    severe post-operative nausea except for bladder surgery in 2002 at [place] not have complications then   Depression    DEPRESSION 01/03/2007   DJD (degenerative joint disease)    knee   Enlarged thyroid 10/18/2019   Family history of adverse reaction to anesthesia    mother and father both had severe post-operative nausea and vomiting   GERD 01/16/2009   GERD (gastroesophageal reflux disease)    Headache(784.0)  01/03/2007   Hyperplastic colon polyp    04/2010   HYPERSOMNIA 04/04/2010   Hypertension    IBS 01/16/2009   IBS (irritable bowel syndrome)    Impaired glucose tolerance 05/18/2011   Menopause    early   Migraine    Obesity    OSTEOARTHRITIS, KNEES, 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  Peak Weight: 250,Visceral Fat Rating: 15, Body Fat%: 48.1, Weight trend over the last 12 months: Decreasing  General:  Alert, oriented and cooperative. Patient is in no acute distress.  Respiratory: Normal respiratory effort, no problems with respiration noted  Extremities: Normal range of motion.    Mental Status: Normal mood and affect. Normal behavior. Normal judgment and thought content.   Assessment and Plan:  1. Class 2 obesity without serious comorbidity with body mass index (BMI) of 37.0 to 37.9 in adult, unspecified obesity type We reviewed weight, biometrics, associated medical conditions and contributing factors with patient. She would benefit from weight loss therapy via a modified calorie, low-carb, high-protein nutritional plan tailored to their REE (resting energy expenditure) which will be determined by indirect calorimetry.  We will also assess for cardiometabolic risk and nutritional derangements via fasting serologies at her next appointment.   Patient has been losing weight over the last 12 months  via diet and exercise.  She is only been on [facility] for about a week she denies any adverse effects.  2. Type 2 diabetes mellitus with other specified complication, without long-term current use of insulin (HCC) Patient has had consecutive A1c's above 6.4 most recent 8.2.  She is on Mounjaro 2.5 mg a day.  She would  like not to have to rely on medications and therefore is interested in weight loss therapy.  She would benefit from losing 10 to 15% of her body weight.  3. Hypertension, unspecified type Blood pressure close to goal.  Continue losartan.  We will check renal parameters at her next office visit.  Weight loss therapy of 5 to 10% with help with her blood pressure as well.       Obesity Treatment / Action Plan:  Patient will work on garnering support from family and friends to begin weight loss journey. Will work on eliminating or reducing the presence of highly palatable, calorie dense foods in the home. Will complete provided nutritional and psychosocial assessment questionnaire before the next appointment. Will be scheduled for indirect calorimetry to determine resting energy expenditure in a fasting state.  This will allow [place] to create a reduced calorie, high-protein meal plan to promote loss of fat mass while preserving muscle mass. Will think about ideas on how to incorporate physical activity into their daily routine. Will work on reducing intake of added sugars, simple sugars and processed carbs. Was counseled on nutritional approaches to weight loss and benefits of complex carbs and high quality protein as part of nutritional weight management. Was counseled on pharmacotherapy and role as an adjunct in weight management.   Obesity Education Performed Today:  She was weighed on the bioimpedance scale and results were discussed and documented in the synopsis.  We discussed obesity as a disease and the importance of a more detailed evaluation of all the factors contributing to the disease.  We discussed the importance of long term lifestyle changes which include nutrition, exercise and behavioral modifications as well as the importance of customizing this to her specific health and social needs.  We discussed the benefits of reaching a healthier weight to alleviate the symptoms of  existing conditions and reduce the risks of the biomechanical, metabolic and psychological effects of obesity.  [name] appears to be in the action stage of change and states they are ready to start intensive lifestyle modifications and behavioral modifications.  30 minutes was spent today on this visit including the above counseling, pre-visit chart review, and post-visit documentation.  Reviewed by clinician on day of visit: allergies, medications, problem list, medical history, surgical history, family history, social history, and previous encounter notes.     I have reviewed the above documentation for accuracy and completeness, and I agree with the above.  Worthy Rancher, MD

## 2022-05-08 ENCOUNTER — Ambulatory Visit (INDEPENDENT_AMBULATORY_CARE_PROVIDER_SITE_OTHER): Payer: 59 | Admitting: Internal Medicine

## 2022-05-29 ENCOUNTER — Encounter (INDEPENDENT_AMBULATORY_CARE_PROVIDER_SITE_OTHER): Payer: Self-pay | Admitting: Internal Medicine

## 2022-05-29 ENCOUNTER — Ambulatory Visit (INDEPENDENT_AMBULATORY_CARE_PROVIDER_SITE_OTHER): Payer: 59 | Admitting: Internal Medicine

## 2022-05-29 ENCOUNTER — Other Ambulatory Visit (INDEPENDENT_AMBULATORY_CARE_PROVIDER_SITE_OTHER): Payer: Self-pay | Admitting: Internal Medicine

## 2022-05-29 ENCOUNTER — Telehealth (INDEPENDENT_AMBULATORY_CARE_PROVIDER_SITE_OTHER): Payer: Self-pay | Admitting: Internal Medicine

## 2022-05-29 VITALS — BP 128/83 | HR 75 | Temp 97.9°F | Ht 65.0 in | Wt 215.0 lb

## 2022-05-29 DIAGNOSIS — E785 Hyperlipidemia, unspecified: Secondary | ICD-10-CM

## 2022-05-29 DIAGNOSIS — I10 Essential (primary) hypertension: Secondary | ICD-10-CM

## 2022-05-29 DIAGNOSIS — E7849 Other hyperlipidemia: Secondary | ICD-10-CM

## 2022-05-29 DIAGNOSIS — E559 Vitamin D deficiency, unspecified: Secondary | ICD-10-CM

## 2022-05-29 DIAGNOSIS — R0602 Shortness of breath: Secondary | ICD-10-CM

## 2022-05-29 DIAGNOSIS — E538 Deficiency of other specified B group vitamins: Secondary | ICD-10-CM

## 2022-05-29 DIAGNOSIS — Z6835 Body mass index (BMI) 35.0-35.9, adult: Secondary | ICD-10-CM

## 2022-05-29 DIAGNOSIS — R5383 Other fatigue: Secondary | ICD-10-CM | POA: Diagnosis not present

## 2022-05-29 DIAGNOSIS — E1169 Type 2 diabetes mellitus with other specified complication: Secondary | ICD-10-CM

## 2022-05-30 ENCOUNTER — Telehealth: Payer: Self-pay | Admitting: *Deleted

## 2022-05-30 LAB — LIPID PANEL WITH LDL/HDL RATIO
Cholesterol, Total: 117 mg/dL (ref 100–199)
HDL: 51 mg/dL (ref >39)
LDL Chol Calc (NIH): 49 mg/dL (ref 0–99)
LDL/HDL Ratio: 1 ratio (ref 0.0–3.2)
Triglycerides: 90 mg/dL (ref 0–149)
VLDL Cholesterol Cal: 17 mg/dL (ref 5–40)

## 2022-05-30 LAB — METHYLMALONIC ACID, SERUM

## 2022-05-30 LAB — VITAMIN B12: Vitamin B-12: 1094 pg/mL (ref 232–1245)

## 2022-05-30 LAB — VITAMIN D 25 HYDROXY (VIT D DEFICIENCY, FRACTURES): Vit D, 25-Hydroxy: 43.6 ng/mL (ref 30.0–100.0)

## 2022-05-30 LAB — TSH: TSH: 0.671 u[IU]/mL (ref 0.450–4.500)

## 2022-05-30 LAB — HEMOGLOBIN A1C
Est. average glucose Bld gHb Est-mCnc: 148 mg/dL
Hgb A1c MFr Bld: 6.8 % — ABNORMAL HIGH (ref 4.8–5.6)

## 2022-05-30 NOTE — Telephone Encounter (Signed)
Need PA on Ozempic (0.25 or 0.5). submitted (Key: [id number]) PA  sent to [email or web address] KitchenRaechel Chute

## 2022-05-31 LAB — INSULIN, RANDOM: INSULIN: 30.1 u[IU]/mL — ABNORMAL HIGH (ref 2.6–24.9)

## 2022-05-31 NOTE — Telephone Encounter (Signed)
Rec'd determination " This request has received a Favorable outcome." Faxed approval to [email or web address]

## 2022-06-04 LAB — METHYLMALONIC ACID, SERUM: Methylmalonic Acid: 126 nmol/L (ref 0–378)

## 2022-06-07 ENCOUNTER — Ambulatory Visit: Payer: 59 | Admitting: Internal Medicine

## 2022-06-07 VITALS — BP 132/78 | HR 73 | Temp 97.8°F | Ht 65.0 in | Wt 218.0 lb

## 2022-06-07 DIAGNOSIS — E099 Drug or chemical induced diabetes mellitus without complications: Secondary | ICD-10-CM | POA: Diagnosis not present

## 2022-06-07 DIAGNOSIS — E785 Hyperlipidemia, unspecified: Secondary | ICD-10-CM | POA: Diagnosis not present

## 2022-06-07 DIAGNOSIS — I10 Essential (primary) hypertension: Secondary | ICD-10-CM | POA: Diagnosis not present

## 2022-06-07 DIAGNOSIS — E538 Deficiency of other specified B group vitamins: Secondary | ICD-10-CM

## 2022-06-07 DIAGNOSIS — E559 Vitamin D deficiency, unspecified: Secondary | ICD-10-CM

## 2022-06-07 DIAGNOSIS — T380X5S Adverse effect of glucocorticoids and synthetic analogues, sequela: Secondary | ICD-10-CM

## 2022-06-07 MED ORDER — TIRZEPATIDE 5 MG/0.5ML ~~LOC~~ SOAJ: 5.0000 mg | SUBCUTANEOUS | 11 refills | Status: DC

## 2022-06-07 NOTE — Progress Notes (Unsigned)
Patient ID: [name], female   DOB: 06/23/1961, 61 y.o.   MRN: [id number]        Chief Complaint: follow up HTN, HLD and hyperglycemia , obesity       HPI:  [name] is a 61 y.o. female here overall doing ok, Pt denies chest pain, increased sob or doe, wheezing, orthopnea, PND, increased LE swelling, palpitations, dizziness or syncope.   Pt denies polydipsia, polyuria, or new focal neuro s/s.    Pt denies fever, wt loss, night sweats, loss of appetite, or other constitutional symptoms  Willing for Cardiac CT score.  Had recent labs done  Tolerating mounjaro 2.5 mg weekly well.        Wt Readings from Last 3 Encounters:  06/07/22 218 lb (98.9 kg)  05/29/22 215 lb (97.5 kg)  04/24/22 222 lb 12.8 oz (101.1 kg)   BP Readings from Last 3 Encounters:  06/07/22 132/78  05/29/22 128/83  04/24/22 137/82         Past Medical History:  Diagnosis Date   Anemia    ANEMIA-IRON DEFICIENCY 01/03/2007   Anxiety    ANXIETY 01/03/2007   Back pain    Complication of anesthesia    severe post-operative nausea except for bladder surgery in 2002 at [facility]-did not have complications then   Depression    DEPRESSION 01/03/2007   Diabetes (HCC)    DJD (degenerative joint disease)    knee   Edema of both lower extremities    Enlarged thyroid 10/18/2019   Family history of adverse reaction to anesthesia    mother and father both had severe post-operative nausea and vomiting   Fatty liver    Foot pain    GERD 01/16/2009   GERD (gastroesophageal reflux disease)    Headache(784.0) 01/03/2007   Hyperplastic colon polyp    04/2010   HYPERSOMNIA 04/04/2010   Hypertension    IBS 01/16/2009   IBS (irritable bowel syndrome)    Impaired glucose tolerance 05/18/2011   Knee pain    Menopause    early   Migraine    Obesity    OSTEOARTHRITIS, KNEES, 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 She has never used smokeless tobacco. She reports that she does not drink alcohol and does not use drugs. family history includes Diabetes in her brother; Hypertension in her mother. Allergies  Allergen Reactions   Meperidine Shortness Of Breath   Morphine Anaphylaxis   Penicillins Hives, Nausea Only, Swelling, Other (See Comments), Anaphylaxis and Nausea And Vomiting    PATIENT HAS HAD A PCN REACTION WITH IMMEDIATE RASH, FACIAL/TONGUE/THROAT SWELLING, SOB, OR LIGHTHEADEDNESS WITH HYPOTENSION:  #  #  YES  #  #  HAS PT DEVELOPED SEVERE RASH INVOLVING MUCUS MEMBRANES or SKIN NECROSIS: #  #  YES  #  #  PATIENT HAS HAD A PCN REACTION THAT REQUIRED HOSPITALIZATION:  #  #  YES  #  #  Has patient had a PCN reaction occurring within the last 10 years: No  vomiting PATIENT HAS HAD A PCN REACTION WITH IMMEDIATE RASH, FACIAL/TONGUE/THROAT SWELLING, SOB, OR LIGHTHEADEDNESS WITH  HYPOTENSION:  #  #  YES  #  #  HAS PT DEVELOPED SEVERE RASH INVOLVING MUCUS MEMBRANES or SKIN NECROSIS: #  #  YES  #  #  PATIENT HAS HAD A PCN REACTION THAT REQUIRED HOSPITALIZATION:  #  #  YES  #  #  Has patient had a PCN reaction occurring within the last 10 years: No   Azithromycin Nausea And Vomiting and Nausea Only   Lisinopril Cough        Sucralfate Rash and Other (See Comments)    Blurry vision  Blurry vision     Doxycycline Nausea Only    nausea   Gabapentin Other (See Comments)    Muscle pain, fatigue   Other    Tyloxapol Other (See Comments)   Amoxicillin Nausea Only   Codeine Nausea Only and Other (See Comments)    Hot flashes also   Hydrocodone Itching, Nausea Only and Rash   Omeprazole Itching and Rash   Oxycodone Nausea Only, Rash and Nausea And Vomiting   Oxycodone-Acetaminophen Nausea And Vomiting and  Rash   Tramadol Itching, Nausea Only and Rash   Current Outpatient Medications on File Prior to Visit  Medication Sig Dispense Refill   esomeprazole (NEXIUM) 20 MG capsule Take 20 mg by mouth daily.     estradiol (ESTRACE) 0.5 MG tablet Take 0.5 mg by mouth daily.     losartan (COZAAR) 100 MG tablet Take 1 tablet (100 mg total) by mouth daily. 90 tablet 3   Probiotic CAPS Take 1 capsule by mouth daily.     rosuvastatin (CRESTOR) 10 MG tablet Take 1 tablet (10 mg total) by mouth daily. 90 tablet 3   acetaminophen (TYLENOL) 500 MG tablet Take 1,000 mg by mouth daily as needed for moderate pain or headache. (Patient not taking: Reported on 05/29/2022)     cetirizine (ZYRTEC) 10 MG tablet Take 1 tablet (10 mg total) by mouth daily. 30 tablet 11   Cholecalciferol (THERA-D 2000) 50 MCG (2000 UT) TABS 1 tab by mouth once daily (Patient not taking: Reported on 05/29/2022) 30 tablet 99   gabapentin (NEURONTIN) 100 MG capsule Take 2 capsules (200 mg total) by mouth 3 (three) times daily as needed. (Patient not taking: Reported on 05/29/2022) 180 capsule 5   glucose blood (ONETOUCH VERIO) test strip 1 each by Other route daily in the afternoon. Use as instructed (Patient not taking: Reported on 05/29/2022) 100 each 3   hydrocortisone 2.5 % cream hydrocortisone 2.5 % topical cream (Patient not taking: Reported on 05/29/2022)     ketoconazole (NIZORAL) 2 % cream ketoconazole 2 % topical cream (Patient not taking: Reported on 05/29/2022)     meloxicam (MOBIC) 7.5 MG tablet TAKE 1 TABLET BY MOUTH EVERY DAY (Patient not taking: Reported on 04/15/2022) 30 tablet 0   promethazine (PHENERGAN) 25 MG tablet Take 25 mg by mouth daily as needed for nausea or vomiting. (Patient not taking: Reported on 05/29/2022)     vitamin B-12 (CYANOCOBALAMIN) 100 MCG tablet Take 100 mcg by mouth daily. (Patient not taking: Reported on 05/29/2022)     No current facility-administered medications on file prior to visit.        ROS:  All  others reviewed and negative.  Objective        PE:  BP 132/78 (BP Location: Right Arm, Patient Position: Sitting, Cuff Size: Large)   Pulse 73   Temp 97.8 F (36.6 C) (Oral)   Ht 5\' 5"  (1.651 m)  Wt 218 lb (98.9 kg)   SpO2 97%   BMI 36.28 kg/m                 Constitutional: Pt appears in NAD               HENT: Head: NCAT.                Right Ear: External ear normal.                 Left Ear: External ear normal.                Eyes: . Pupils are equal, round, and reactive to light. Conjunctivae and EOM are normal               Nose: without d/c or deformity               Neck: Neck supple. Gross normal ROM               Cardiovascular: Normal rate and regular rhythm.                 Pulmonary/Chest: Effort normal and breath sounds without rales or wheezing.                Abd:  Soft, NT, ND, + BS, no organomegaly               Neurological: Pt is alert. At baseline orientation, motor grossly intact               Skin: Skin is warm. No rashes, no other new lesions, LE edema - none               Psychiatric: Pt behavior is normal without agitation   Micro: none  Cardiac tracings I have personally interpreted today:  none  Pertinent Radiological findings (summarize): none   Lab Results  Component Value Date   WBC 9.8 03/06/2022   HGB 13.6 03/06/2022   HCT 40.3 03/06/2022   PLT 212 03/06/2022   GLUCOSE 195 (H) 03/06/2022   CHOL 117 05/29/2022   TRIG 90 05/29/2022   HDL 51 05/29/2022   LDLDIRECT 124.0 05/26/2020   LDLCALC 49 05/29/2022   ALT 28 03/06/2022   AST 23 03/06/2022   NA 141 03/06/2022   K 4.5 03/06/2022   CL 101 03/06/2022   CREATININE 0.78 03/06/2022   BUN 8 03/06/2022   CO2 22 03/06/2022   TSH 0.671 05/29/2022   HGBA1C 6.8 (H) 05/29/2022   MICROALBUR <0.7 05/28/2021   Assessment/Plan:  [name] is a 61 y.o. White or Caucasian [1] female with  has a past medical history of Anemia, ANEMIA-IRON DEFICIENCY (01/03/2007), Anxiety, ANXIETY  (01/03/2007), Back pain, Complication of anesthesia, Depression, DEPRESSION (01/03/2007), Diabetes (HCC), DJD (degenerative joint disease), Edema of both lower extremities, Enlarged thyroid (10/18/2019), Family history of adverse reaction to anesthesia, Fatty liver, Foot pain, GERD (01/16/2009), GERD (gastroesophageal reflux disease), Headache(784.0) (01/03/2007), Hyperplastic colon polyp, HYPERSOMNIA (04/04/2010), Hypertension, IBS (01/16/2009), IBS (irritable bowel syndrome), Impaired glucose tolerance (05/18/2011), Knee pain, Menopause, Migraine, Obesity, OSTEOARTHRITIS, KNEES, 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  Steroid-induced diabetes (HCC) Lab Results  Component Value Date   HGBA1C 6.8 (H) 05/29/2022   Uncontrolled, ok for increased mounjaro to 5 mg weekly for sugar and weight   Dyslipidemia Lab Results  Component Value Date   LDLCALC 49 05/29/2022   Stable, pt to continue  current statin crestor 10 mg qd   Hypertension, uncontrolled BP Readings from Last 3 Encounters:  06/07/22 132/78  05/29/22 128/83  04/24/22 137/82   Stable, pt to continue medical treatment losartan 100 mg qd  Followup: No follow-ups on file.  Oliver Barre, MD 06/09/2022 5:07 PM [facility]

## 2022-06-07 NOTE — Patient Instructions (Addendum)
Ok to increase the mounjaro to the 5 mg weekly  Please continue all other medications as before, and refills have been done if requested.  Please have the pharmacy call with any other refills you may need.  Please continue your efforts at being more active, low cholesterol diet, and weight control.  Please keep your appointments with your specialists as you may have planned  You will be contacted regarding the referral for: Cardiac CT score  We can hold on further lab testing today  Please make an Appointment to return in 6 months, or sooner if needed, also with Lab Appointment for testing done 3-5 days before at the FIRST FLOOR Lab (so this is for TWO appointments - please see the scheduling desk as you leave)

## 2022-06-09 ENCOUNTER — Encounter: Payer: Self-pay | Admitting: Internal Medicine

## 2022-06-09 NOTE — Assessment & Plan Note (Signed)
Lab Results  Component Value Date   HGBA1C 6.8 (H) 05/29/2022   Uncontrolled, ok for increased mounjaro to 5 mg weekly for sugar and weight

## 2022-06-09 NOTE — Assessment & Plan Note (Signed)
Lab Results  Component Value Date   LDLCALC 49 05/29/2022   Stable, pt to continue current statin crestor 10 mg qd

## 2022-06-09 NOTE — Assessment & Plan Note (Signed)
BP Readings from Last 3 Encounters:  06/07/22 132/78  05/29/22 128/83  04/24/22 137/82   Stable, pt to continue medical treatment losartan 100 mg qd

## 2022-06-13 NOTE — Progress Notes (Signed)
Chief Complaint:   OBESITY [name] (MR# [id number]) is a 61 y.o. female who presents for evaluation and treatment of obesity and related comorbidities. Current BMI is Body mass index is 35.78 kg/m. [name] has been struggling with her weight for many years and has been unsuccessful in either losing weight, maintaining weight loss, or reaching her healthy weight goal.  [name] is currently in the action stage of change and ready to dedicate time achieving and maintaining a healthier weight. [name] is interested in becoming our patient and working on intensive lifestyle modifications including (but not limited to) diet and exercise for weight loss.  Delaina's habits were reviewed today and are as follows: Her family eats meals together, she struggles with family and or coworkers weight loss sabotage, her desired weight loss is 75 lbs, she started gaining weight at age 7, her heaviest weight ever was 250 pounds, she is a picky eater and doesn't like to eat healthier foods, she has significant food cravings issues, she skips meals frequently, she is frequently drinking liquids with calories, she frequently makes poor food choices, she has binge eating behaviors, and she struggles with emotional eating.  Depression Screen [name]'s Food and Mood (modified PHQ-9) score was 4.  Subjective:   1. Other fatigue Discussed labs with patient today. [name] admits to daytime somnolence and admits to waking up still tired. Patient has a history of symptoms of daytime fatigue and morning fatigue. [name] generally gets 4 hours of sleep per night, and states that she has poor sleep quality. Snoring is present. Apneic episodes are present. Epworth Sleepiness Score is 15.   2. SOB (shortness of breath) on exertion [name] notes increasing shortness of breath with exercising and seems to be worsening over time with weight gain. She notes getting out of breath sooner with activity than she used to.  This has gotten worse recently. [name] denies shortness of breath at rest or orthopnea.  3. Type 2 diabetes mellitus with other specified complication, without long-term current use of insulin (HCC) Discussed labs with patient today. A1c 8.2. [name] is on her 2nd week of Mounjaro and is without side effects.  4. Essential hypertension Discussed labs with patient today. Well controlled on losartan. Recent renal parameters within normal limits.  5. Other hyperlipidemia Discussed labs with patient today. LDL 66 and HDL 60. Pt is on rosuvastatin with no side effects. Adequate dose for risk. Pt counseled on benefits and ASCVD reduction.  6. Vitamin D deficiency Discussed labs with patient today. [name] is taking OTC Vitamin D 3,000 IU twice a day.  7. B12 deficiency She is on OTC B12.  Assessment/Plan:   1. Other fatigue Debarah does feel that her weight is causing her energy to be lower than it should be. Fatigue may be related to obesity, depression or many other causes. Labs will be ordered, and in the meanwhile, [name] will focus on self care including making healthy food choices, increasing physical activity and focusing on stress reduction.  - EKG 12-Lead  2. SOB (shortness of breath) on exertion [name] does feel that she gets out of breath more easily that she used to when she exercises. [name]'s shortness of breath appears to be obesity related and exercise induced. She has agreed to work on weight loss and gradually increase exercise to treat her exercise induced shortness of breath. Will continue to monitor closely.  3. Type 2 diabetes mellitus with other specified complication, without long-term current use of insulin (  HCC) Weight loss therapy. Low carb reduced calorie plan. Continue Mounjaro ar current dose. - Insulin, random  4. Essential hypertension Weight loss therapy. Continue ARB.  5. Other hyperlipidemia Weight l - TSH - Lipid Panel With LDL/HDL Ratio  6.  Vitamin D deficiency  Check Vitamin D level to make sure pt is not over supplementing. Goal is 50-60 mg/dL.  - VITAMIN D 25 Hydroxy (Vit-D Deficiency, Fractures)  7. B12 deficiency Lab/Orders today: - Vitamin B12 - Methylmalonic Acid  8. Class 2 severe obesity with serious comorbidity and body mass index (BMI) of 35.0 to 35.9 in adult, unspecified obesity type (HCC) [name] is currently in the action stage of change and her goal is to continue with weight loss efforts. I recommend [name] begin the structured treatment plan as follows:  She has agreed to the Category 1 Plan.  Exercise goals:  As is and consider strength training.    Behavioral modification strategies: increasing lean protein intake, decreasing simple carbohydrates, increasing vegetables, increasing water intake, increasing high fiber foods, no skipping meals, meal planning and cooking strategies, keeping healthy foods in the home, better snacking choices, avoiding temptations, and planning for success.  She was informed of the importance of frequent follow-up visits to maximize her success with intensive lifestyle modifications for her multiple health conditions. She was informed we would discuss her lab results at her next visit unless there is a critical issue that needs to be addressed sooner. [name] agreed to keep her next visit at the agreed upon time to discuss these results.  Objective:   Blood pressure 128/83, pulse 75, temperature 97.9 F (36.6 C), height 5\' 5"  (1.651 m), weight 215 lb (97.5 kg), SpO2 99 %. Body mass index is 35.78 kg/m.  EKG: Normal sinus rhythm, rate 68.  Indirect Calorimeter completed today shows a VO2 of 104 and a REE of 720.  Her calculated basal metabolic rate is thus her basal metabolic rate is worse than expected.  General: Cooperative, alert, well developed, in no acute distress. HEENT: Conjunctivae and lids unremarkable. Cardiovascular: Regular rhythm.  Lungs: Normal work of  breathing. Neurologic: No focal deficits.   Lab Results  Component Value Date   CREATININE 0.78 03/06/2022   BUN 8 03/06/2022   NA 141 03/06/2022   K 4.5 03/06/2022   CL 101 03/06/2022   CO2 22 03/06/2022   Lab Results  Component Value Date   ALT 28 03/06/2022   AST 23 03/06/2022   ALKPHOS 69 03/06/2022   BILITOT 0.4 03/06/2022   Lab Results  Component Value Date   HGBA1C 6.8 (H) 05/29/2022   HGBA1C 8.2 (A) 04/15/2022   HGBA1C 7.7 (H) 12/06/2021   HGBA1C 7.1 (H) 05/28/2021   HGBA1C 7.0 (H) 10/25/2020   Lab Results  Component Value Date   INSULIN 30.1 (H) 05/30/2022   Lab Results  Component Value Date   TSH 0.671 05/29/2022   Lab Results  Component Value Date   CHOL 117 05/29/2022   HDL 51 05/29/2022   LDLCALC 49 05/29/2022   LDLDIRECT 124.0 05/26/2020   TRIG 90 05/29/2022   CHOLHDL 3 12/06/2021   Lab Results  Component Value Date   WBC 9.8 03/06/2022   HGB 13.6 03/06/2022   HCT 40.3 03/06/2022   MCV 88 03/06/2022   PLT 212 03/06/2022   Lab Results  Component Value Date   IRON 68 05/28/2021   TIBC 492.8 (H) 05/28/2021   FERRITIN 57.6 05/28/2021    Attestation Statements:  Reviewed by clinician on day of visit: allergies, medications, problem list, medical history, surgical history, family history, social history, and previous encounter notes.  Time spent on visit including pre-visit chart review and post-visit charting and care was 40 minutes.   I, Kyung Rudd, BS, CMA, am acting as transcriptionist for Worthy Rancher, MD.  I have reviewed the above documentation for accuracy and completeness, and I agree with the above. -Worthy Rancher, MD

## 2022-06-18 ENCOUNTER — Ambulatory Visit (INDEPENDENT_AMBULATORY_CARE_PROVIDER_SITE_OTHER): Payer: 59 | Admitting: Internal Medicine

## 2022-06-18 ENCOUNTER — Encounter (INDEPENDENT_AMBULATORY_CARE_PROVIDER_SITE_OTHER): Payer: Self-pay | Admitting: Internal Medicine

## 2022-06-18 ENCOUNTER — Encounter: Payer: Self-pay | Admitting: Internal Medicine

## 2022-06-18 VITALS — BP 133/83 | HR 74 | Temp 98.1°F | Ht 65.0 in | Wt 212.0 lb

## 2022-06-18 DIAGNOSIS — E538 Deficiency of other specified B group vitamins: Secondary | ICD-10-CM | POA: Diagnosis not present

## 2022-06-18 DIAGNOSIS — E669 Obesity, unspecified: Secondary | ICD-10-CM

## 2022-06-18 DIAGNOSIS — E559 Vitamin D deficiency, unspecified: Secondary | ICD-10-CM | POA: Diagnosis not present

## 2022-06-18 DIAGNOSIS — Z7985 Long-term (current) use of injectable non-insulin antidiabetic drugs: Secondary | ICD-10-CM

## 2022-06-18 DIAGNOSIS — E785 Hyperlipidemia, unspecified: Secondary | ICD-10-CM

## 2022-06-18 DIAGNOSIS — E1169 Type 2 diabetes mellitus with other specified complication: Secondary | ICD-10-CM

## 2022-06-18 DIAGNOSIS — Z6835 Body mass index (BMI) 35.0-35.9, adult: Secondary | ICD-10-CM

## 2022-06-18 NOTE — Assessment & Plan Note (Signed)
Her most recent A1c is 6.8 and down from 8.2.  She is on Mounjaro 5 mg a week and denies any adverse effects.  She will continue current medication and medical nutrition therapy.  We we will monitor for the development of her restrictive eating pattern due to medication as this may cause muscle loss.

## 2022-06-18 NOTE — Assessment & Plan Note (Signed)
Most recent LDL in the 50s.  She is on rosuvastatin 10 mg a day without any side effects.  She will continue on statin therapy currently dosed appropriately for risk category

## 2022-06-18 NOTE — Assessment & Plan Note (Signed)
Most recent level in the 40s and improved she will continue to take over-the-counter vitamin D3 at 2000 units daily.

## 2022-06-18 NOTE — Progress Notes (Signed)
Chief Complaint:   OBESITY [name] is here to discuss her progress with her obesity treatment plan along with follow-up of her obesity related diagnoses. [name] is on the Category 1 Plan and states she is following her eating plan approximately 50% of the time. [name] states she is doing stretches and weights for 30 minutes 3 times per week.  Today's visit was #: 2 Starting weight: 215 lbs Starting date: 05/29/2022 Today's weight: 212 lbs Today's date: 06/18/2022 Total lbs lost to date: 3 Total lbs lost since last in-office visit: 3  Interim History: Patient presents today for follow-up.  Her [name] was recently increased to 5 mg once a week.  She notes a decrease in appetite and at times has to remind herself to eat.  She has not been measuring her protein.  She reports adequate satiety and satiation.  She denies abnormal cravings or eating patterns.  She is thinking about beginning an exercise program with her son.  She has incorporated protein drink in the form of Glucerna a few times a week.  Her peak weight was 245 in 2019 she is now at 212 with an 18 pound weight loss in the last 2 months.  Subjective:   1. Type 2 diabetes mellitus with other specified complication, without long-term current use of insulin (HCC) Her most recent A1c is 6.8 and down from 8.2.  She is on [name] 5 mg a week and denies any adverse effects.    2. Vitamin D deficiency Most recent level in the 40s and improved.   3. B12 deficiency Reviewed most recent B12 and methylmalonic acid which were all within normal limits.    4. Dyslipidemia Most recent LDL in the 50s.  She is on rosuvastatin 10 mg a day without any side effects.    Assessment/Plan:   1. Type 2 diabetes mellitus with other specified complication, without long-term current use of insulin (HCC) She will continue current medication and medical nutrition therapy.  We we will monitor for the development of a restrictive eating pattern due  to medication, as this may cause muscle loss.  2. Vitamin D deficiency She will continue to take over-the-counter vitamin D3 at 2000 units daily.  3. B12 deficiency She will continue current dose of vitamin B12 currently at 100 mcg daily.  The use of chronic PPI may be associated with deficiency state, recommend evaluation for ongoing need.  4. Dyslipidemia She will continue on statin therapy currently dosed appropriately for risk category  5. Obesity,current BMI 35.4 [name] is currently in the action stage of change. As such, her goal is to continue with weight loss efforts. She has agreed to the Category 1 Plan.   Exercise goals: Start strengthening exercises.   Behavioral modification strategies: increasing lean protein intake, increasing vegetables, increasing water intake, no skipping meals, avoiding temptations, and planning for success.  [name] has agreed to follow-up with our clinic in 2 to 3 weeks. She was informed of the importance of frequent follow-up visits to maximize her success with intensive lifestyle modifications for her multiple health conditions.   Objective:   Blood pressure 133/83, pulse 74, temperature 98.1 F (36.7 C), height 5\' 5"  (1.651 m), weight 212 lb (96.2 kg), SpO2 100 %. Body mass index is 35.28 kg/m.  General: Cooperative, alert, well developed, in no acute distress. HEENT: Conjunctivae and lids unremarkable. Cardiovascular: Regular rhythm.  Lungs: Normal work of breathing. Neurologic: No focal deficits.   Lab Results  Component Value Date  CREATININE 0.78 03/06/2022   BUN 8 03/06/2022   NA 141 03/06/2022   K 4.5 03/06/2022   CL 101 03/06/2022   CO2 22 03/06/2022   Lab Results  Component Value Date   ALT 28 03/06/2022   AST 23 03/06/2022   ALKPHOS 69 03/06/2022   BILITOT 0.4 03/06/2022   Lab Results  Component Value Date   HGBA1C 6.8 (H) 05/29/2022   HGBA1C 8.2 (A) 04/15/2022   HGBA1C 7.7 (H) 12/06/2021   HGBA1C 7.1 (H)  05/28/2021   HGBA1C 7.0 (H) 10/25/2020   Lab Results  Component Value Date   INSULIN 30.1 (H) 05/30/2022   Lab Results  Component Value Date   TSH 0.671 05/29/2022   Lab Results  Component Value Date   CHOL 117 05/29/2022   HDL 51 05/29/2022   LDLCALC 49 05/29/2022   LDLDIRECT 124.0 05/26/2020   TRIG 90 05/29/2022   CHOLHDL 3 12/06/2021   Lab Results  Component Value Date   VD25OH 43.6 05/29/2022   VD25OH 35.68 12/06/2021   VD25OH 23.95 (L) 05/28/2021   Lab Results  Component Value Date   WBC 9.8 03/06/2022   HGB 13.6 03/06/2022   HCT 40.3 03/06/2022   MCV 88 03/06/2022   PLT 212 03/06/2022   Lab Results  Component Value Date   IRON 68 05/28/2021   TIBC 492.8 (H) 05/28/2021   FERRITIN 57.6 05/28/2021   Attestation Statements:   Reviewed by clinician on day of visit: allergies, medications, problem list, medical history, surgical history, family history, social history, and previous encounter notes.  Time spent on visit including pre-visit chart review and post-visit care and charting was 40 minutes.   Wilhemena Durie, am acting as transcriptionist for Thomes Dinning, MD.  I have reviewed the above documentation for accuracy and completeness, and I agree with the above. -Thomes Dinning, MD

## 2022-06-19 ENCOUNTER — Other Ambulatory Visit: Payer: Self-pay

## 2022-06-19 MED ORDER — ONETOUCH VERIO VI STRP: ORAL_STRIP | 3 refills | Status: DC

## 2022-06-20 ENCOUNTER — Other Ambulatory Visit: Payer: Self-pay | Admitting: Internal Medicine

## 2022-06-20 NOTE — Telephone Encounter (Signed)
Please refill as per office routine med refill policy (all routine meds to be refilled for 3 mo or monthly (per pt preference) up to one year from last visit, then month to month grace period for 3 mo, then further med refills will have to be denied) ? ?

## 2022-07-03 ENCOUNTER — Ambulatory Visit (INDEPENDENT_AMBULATORY_CARE_PROVIDER_SITE_OTHER): Payer: 59 | Admitting: Internal Medicine

## 2022-07-26 ENCOUNTER — Ambulatory Visit (HOSPITAL_BASED_OUTPATIENT_CLINIC_OR_DEPARTMENT_OTHER)
Admission: RE | Admit: 2022-07-26 | Discharge: 2022-07-26 | Disposition: A | Discharge status: Home / Self Care | Payer: 59 | Source: Ambulatory Visit | Attending: Internal Medicine | Admitting: Internal Medicine

## 2022-07-26 ENCOUNTER — Other Ambulatory Visit: Payer: Self-pay | Admitting: Internal Medicine

## 2022-07-26 DIAGNOSIS — I10 Essential (primary) hypertension: Secondary | ICD-10-CM

## 2022-07-26 DIAGNOSIS — E099 Drug or chemical induced diabetes mellitus without complications: Secondary | ICD-10-CM

## 2022-07-26 DIAGNOSIS — E785 Hyperlipidemia, unspecified: Secondary | ICD-10-CM

## 2022-07-26 DIAGNOSIS — T380X5S Adverse effect of glucocorticoids and synthetic analogues, sequela: Secondary | ICD-10-CM | POA: Insufficient documentation

## 2022-07-26 MED ORDER — TIRZEPATIDE 7.5 MG/0.5ML ~~LOC~~ SOAJ: 7.5000 mg | SUBCUTANEOUS | 3 refills | Status: DC

## 2022-08-08 ENCOUNTER — Encounter (INDEPENDENT_AMBULATORY_CARE_PROVIDER_SITE_OTHER): Payer: Self-pay | Admitting: Internal Medicine

## 2022-08-08 ENCOUNTER — Ambulatory Visit (INDEPENDENT_AMBULATORY_CARE_PROVIDER_SITE_OTHER): Payer: 59 | Admitting: Internal Medicine

## 2022-08-08 VITALS — BP 119/67 | HR 75 | Temp 97.6°F | Ht 65.0 in | Wt 202.0 lb

## 2022-08-08 DIAGNOSIS — F5104 Psychophysiologic insomnia: Secondary | ICD-10-CM

## 2022-08-08 DIAGNOSIS — E1169 Type 2 diabetes mellitus with other specified complication: Secondary | ICD-10-CM

## 2022-08-08 DIAGNOSIS — I2510 Atherosclerotic heart disease of native coronary artery without angina pectoris: Secondary | ICD-10-CM

## 2022-08-08 DIAGNOSIS — E669 Obesity, unspecified: Secondary | ICD-10-CM

## 2022-08-08 DIAGNOSIS — Z6835 Body mass index (BMI) 35.0-35.9, adult: Secondary | ICD-10-CM

## 2022-08-08 DIAGNOSIS — Z7985 Long-term (current) use of injectable non-insulin antidiabetic drugs: Secondary | ICD-10-CM

## 2022-08-08 NOTE — Assessment & Plan Note (Signed)
We provided her with resources.  I think she will benefit from cognitive behavioral therapy for insomnia.  She is only getting about 5 hours of sleep which may affect her weight.

## 2022-08-08 NOTE — Assessment & Plan Note (Addendum)
I reviewed recent coronary artery calcium score.  She has evidence of CAD.  She was recently started on aspirin.  She is currently on high intensity statin therapy with an LDL less than 55.  No signs or symptoms of angina or marked heart failure.  She will continue with weight loss therapy and increasing physical activity

## 2022-08-08 NOTE — Assessment & Plan Note (Signed)
Improved.  Following a reduced calorie nutrition plan and on Mounjaro.  She denies any adverse effects.  No signs of restrictive eating or skipping meals.  I recommend keeping her at the current dose as increasing medication may reduce her caloric and protein intake  resulting in inadequate nutrition and sarcopenia.

## 2022-08-08 NOTE — Progress Notes (Signed)
Office: [phone]  /  Fax: [phone]  WEIGHT SUMMARY AND BIOMETRICS  Medical Weight Loss Height: 5' 5"$  (1.651 m) Weight: 202 lb (91.6 kg) Temp: 97.6 F (36.4 C) Pulse Rate: 75 BP: 119/67 SpO2: 99 % Fasting: y Labs: n Today's Visit #: 3 Weight at Last VIsit: 212 lb Weight Lost Since Last Visit: 10 lbs  Body Fat %: 44.6 % Fat Mass (lbs): 90.4 lbs Muscle Mass (lbs): 90.4 lbs Total Body Water (lbs): 73.8 lbs Visceral Fat Rating : 13 Peak Weight: 250 lb Starting Date: 05/29/22 Starting Weight: 215 lb Total Weight Loss (lbs): 13 lb (5.897 kg)    HPI  Chief Complaint: OBESITY  [name] is here to discuss her progress with her obesity treatment plan. She is unsure of her plan and states she is following her eating plan approximately 70 % of the time. She states she is exercising 45-60 minutes 5 times per week.   Interval History:  Since last office visit she adherence to prescribed reduced calorie healthy diet is excellent. Denies problems with appetite and hunger signals.  Denies problems with satiety and satiation.  Denies problems with eating patterns and portion control.  Sleeping approximately 4 hours a day.  Sleep described as non-restorative.  Stress levels are reported as medium and due to Family.  Barriers identified none.    Pharmacotherapy: Mounjaro with primary indication diabetes  PHYSICAL EXAM:  Blood pressure 119/67, pulse 75, temperature 97.6 F (36.4 C), height 5' 5"$  (1.651 m), weight 202 lb (91.6 kg), SpO2 99 %. Body mass index is 33.61 kg/m.  General: She is overweight, cooperative, alert, well developed, and in no acute distress. PSYCH: Has normal mood, affect and thought process.   HEENT: EOMI, sclerae are anicteric. Lungs: Normal breathing effort, no conversational dyspnea. Extremities: No edema.  Neurologic: No gross sensory or motor deficits. No tremors or fasciculations noted.    DIAGNOSTIC DATA REVIEWED:  BMET    Component  Value Date/Time   NA 141 03/06/2022 0936   K 4.5 03/06/2022 0936   CL 101 03/06/2022 0936   CO2 22 03/06/2022 0936   GLUCOSE 195 (H) 03/06/2022 0936   GLUCOSE 154 (H) 12/06/2021 0956   BUN 8 03/06/2022 0936   CREATININE 0.78 03/06/2022 0936   CREATININE 0.73 04/10/2015 1659   CALCIUM 10.2 03/06/2022 0936   GFRNONAA >60 08/10/2021 1144   GFRAA 103 09/22/2019 1017   Lab Results  Component Value Date   HGBA1C 6.8 (H) 05/29/2022   HGBA1C 5.9 01/07/2007   Lab Results  Component Value Date   INSULIN 30.1 (H) 05/30/2022   Lab Results  Component Value Date   TSH 0.671 05/29/2022   CBC    Component Value Date/Time   WBC 9.8 03/06/2022 0936   WBC 8.2 12/06/2021 0956   RBC 4.58 03/06/2022 0936   RBC 4.68 12/06/2021 0956   HGB 13.6 03/06/2022 0936   HCT 40.3 03/06/2022 0936   PLT 212 03/06/2022 0936   MCV 88 03/06/2022 0936   MCH 29.7 03/06/2022 0936   MCH 28.6 08/10/2021 1144   MCHC 33.7 03/06/2022 0936   MCHC 32.7 12/06/2021 0956   RDW 12.9 03/06/2022 0936   Iron Studies    Component Value Date/Time   IRON 68 05/28/2021 0917   TIBC 492.8 (H) 05/28/2021 0917   FERRITIN 57.6 05/28/2021 0917   IRONPCTSAT 13.8 (L) 05/28/2021 0917   Lipid Panel     Component Value Date/Time   CHOL 117 05/29/2022 1424   TRIG  90 05/29/2022 1424   HDL 51 05/29/2022 1424   CHOLHDL 3 12/06/2021 0956   VLDL 25.8 12/06/2021 0956   LDLCALC 49 05/29/2022 1424   LDLDIRECT 124.0 05/26/2020 1009   Hepatic Function Panel     Component Value Date/Time   PROT 7.3 03/06/2022 0936   ALBUMIN 4.5 03/06/2022 0936   AST 23 03/06/2022 0936   ALT 28 03/06/2022 0936   ALKPHOS 69 03/06/2022 0936   BILITOT 0.4 03/06/2022 0936   BILIDIR 0.1 12/06/2021 0956      Component Value Date/Time   TSH 0.671 05/29/2022 1424   Nutritional Lab Results  Component Value Date   VD25OH 43.6 05/29/2022   VD25OH 35.68 12/06/2021   VD25OH 23.95 (L) 05/28/2021     ASSESSMENT AND PLAN  TREATMENT PLAN FOR  OBESITY:  Recommended Dietary Goals  Nigel is currently in the action stage of change. As such, her goal is to continue weight management plan. She has agreed to the Category 1 Plan.  Behavioral Intervention  We discussed the following Behavioral Modification Strategies today: increasing lean protein intake, increasing vegetables, avoid skipping meals, increase water intake, work on meal planning and easy cooking plans, reading food labels and making healthy choices when eating convenient foods, and work on tracking and journaling calories using tracking App.  Additional resources provided today: NA  Recommended Physical Activity Goals  Keron has been advised to work up to 150 minutes of moderate intensity aerobic activity a week and strengthening exercises 2-3 times per week for cardiovascular health, weight loss maintenance and preservation of muscle mass.   She has agreed to continue physical activity as is.    Pharmacotherapy We discussed various medication options to help Ahlea with her weight loss efforts and we both agreed to continue on [place] with primary indication diabetes at current dose.  ASSOCIATED CONDITIONS ADDRESSED TODAY  Type 2 diabetes mellitus with other specified complication, without long-term current use of insulin (HCC) Assessment & Plan: Improved.  Following a reduced calorie nutrition plan and on Mounjaro.  She denies any adverse effects.  No signs of restrictive eating or skipping meals.  I recommend keeping her at the current dose as increasing medication may reduce her caloric and protein intake  resulting in inadequate nutrition and sarcopenia.   Obesity,current BMI 35.4  Psychophysiological insomnia Assessment & Plan: We provided her with resources.  I think she will benefit from cognitive behavioral therapy for insomnia.  She is only getting about 5 hours of sleep which may affect her weight.   Coronary artery calcification seen on CAT  scan Assessment & Plan: I reviewed recent coronary artery calcium score.  She has evidence of CAD.  She was recently started on aspirin.  She is currently on high intensity statin therapy with an LDL less than 55.  No signs or symptoms of angina or marked heart failure.  She will continue with weight loss therapy and increasing physical activity       Return in about 2 weeks (around 08/22/2022) for For Weight Mangement with Dr. Gerarda Fraction.Marland Kitchen She was informed of the importance of frequent follow up visits to maximize her success with intensive lifestyle modifications for her multiple health conditions.   ATTESTASTION STATEMENTS:  Reviewed by clinician on day of visit: allergies, medications, problem list, medical history, surgical history, family history, social history, and previous encounter notes.   Time spent on visit including pre-visit chart review and post-visit care and charting was 30 minutes.    Thomes Dinning, MD

## 2022-08-13 ENCOUNTER — Other Ambulatory Visit (HOSPITAL_BASED_OUTPATIENT_CLINIC_OR_DEPARTMENT_OTHER): Payer: Self-pay | Admitting: Nurse Practitioner

## 2022-08-19 LAB — HM DIABETES EYE EXAM

## 2022-08-26 ENCOUNTER — Encounter: Payer: Self-pay | Admitting: Internal Medicine

## 2022-08-26 ENCOUNTER — Ambulatory Visit: Payer: 59 | Admitting: Internal Medicine

## 2022-08-26 VITALS — BP 124/66 | HR 79 | Temp 97.9°F | Ht 65.0 in | Wt 201.0 lb

## 2022-08-26 DIAGNOSIS — E1165 Type 2 diabetes mellitus with hyperglycemia: Secondary | ICD-10-CM | POA: Diagnosis not present

## 2022-08-26 DIAGNOSIS — F5101 Primary insomnia: Secondary | ICD-10-CM

## 2022-08-26 DIAGNOSIS — G4700 Insomnia, unspecified: Secondary | ICD-10-CM | POA: Insufficient documentation

## 2022-08-26 DIAGNOSIS — I10 Essential (primary) hypertension: Secondary | ICD-10-CM

## 2022-08-26 DIAGNOSIS — E559 Vitamin D deficiency, unspecified: Secondary | ICD-10-CM

## 2022-08-26 MED ORDER — TRAZODONE HCL 50 MG PO TABS: 50.0000 mg | ORAL_TABLET | Freq: Every day | ORAL | 1 refills | Status: DC

## 2022-08-26 NOTE — Patient Instructions (Signed)
Please take all new medication as prescribed  - the trazodone at bedtime as needed  It would be fine to increase the mounjaro to 10 mg if you tolerate the 7.5 well for about 1 month  Please continue all other medications as before, and refills have been done if requested.  Please have the pharmacy call with any other refills you may need.  Please continue your efforts at being more active, low cholesterol diet, and weight control.  Please keep your appointments with your specialists as you may have planned  Please make an Appointment to return in June 2024, or sooner if needed, also with Lab Appointment for testing done 3-5 days before at the [facility] (so this is for TWO appointments - please see the scheduling desk as you leave)

## 2022-08-26 NOTE — Assessment & Plan Note (Signed)
Lab Results  Component Value Date   HGBA1C 6.8 (H) 05/29/2022   Stable, pt to continue current medical treatment mounjaro 7,5 mg weekly, consdier increased to 10 mg in 1-2 mo if tolerating well

## 2022-08-26 NOTE — Progress Notes (Signed)
Patient ID: [name], female   DOB: 25-Nov-1960, 62 y.o.   MRN: [id number]        Chief Complaint: follow up insomnia, htn, dm, low vit d       HPI:  [name] is a 62 y.o. female here with c/o worsening 1-2 mo insomnia unable to get to sleep most nights and now frustrating to her, Denies worsening depressive symptoms, suicidal ideation, or panic; has ongoing anxiety worsening recently with son's recent seizure.  Pt denies chest pain, increased sob or doe, wheezing, orthopnea, PND, increased LE swelling, palpitations, dizziness or syncope.   Pt denies polydipsia, polyuria, or new focal neuro s/s.         Wt Readings from Last 3 Encounters:  08/26/22 201 lb (91.2 kg)  08/08/22 202 lb (91.6 kg)  06/18/22 212 lb (96.2 kg)   BP Readings from Last 3 Encounters:  08/26/22 124/66  08/08/22 119/67  06/18/22 133/83         Past Medical History:  Diagnosis Date   Anemia    ANEMIA-IRON DEFICIENCY 01/03/2007   Anxiety    ANXIETY 01/03/2007   Back pain    Complication of anesthesia    severe post-operative nausea except for bladder surgery in 2002 at [place] not have complications then   Depression    DEPRESSION 01/03/2007   Diabetes ([place])    DJD (degenerative joint disease)    knee   Edema of both lower extremities    Enlarged thyroid 10/18/2019   Family history of adverse reaction to anesthesia    mother and father both had severe post-operative nausea and vomiting   Fatty liver    Foot pain    GERD 01/16/2009   GERD (gastroesophageal reflux disease)    Headache(784.0) 01/03/2007   Hyperplastic colon polyp    04/2010   HYPERSOMNIA 04/04/2010   Hypertension    IBS 01/16/2009   IBS (irritable bowel syndrome)    Impaired glucose tolerance 05/18/2011   Knee pain    Menopause    early   Migraine    Obesity    OSTEOARTHRITIS, KNEES, 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 She has never used smokeless tobacco. She reports that she does not drink alcohol and does not use drugs. family history includes Diabetes in her brother; Hypertension in her mother. Allergies  Allergen Reactions   Meperidine Shortness Of Breath   Morphine Anaphylaxis   Penicillins Hives, Nausea Only, Swelling, Other (See Comments), Anaphylaxis and Nausea And Vomiting    PATIENT HAS HAD A PCN REACTION WITH IMMEDIATE RASH, FACIAL/TONGUE/THROAT SWELLING, SOB, OR LIGHTHEADEDNESS WITH HYPOTENSION:  #  #  YES  #  #  HAS PT DEVELOPED SEVERE RASH INVOLVING MUCUS MEMBRANES or SKIN NECROSIS: #  #  YES  #  #  PATIENT HAS HAD A PCN REACTION THAT REQUIRED HOSPITALIZATION:  #  #  YES  #  #  Has patient had a PCN reaction occurring within the last 10 years: No  vomiting PATIENT HAS HAD A PCN REACTION WITH IMMEDIATE RASH, FACIAL/TONGUE/THROAT SWELLING, SOB, OR LIGHTHEADEDNESS WITH HYPOTENSION:  #  #  YES  #  #  HAS PT DEVELOPED SEVERE RASH INVOLVING MUCUS MEMBRANES or SKIN NECROSIS: #  #  YES  #  #  PATIENT HAS HAD A PCN REACTION THAT REQUIRED HOSPITALIZATION:  #  #  YES  #  #  Has patient had a PCN reaction occurring within the last 10 years: No   Azithromycin Nausea And Vomiting and Nausea Only   Lisinopril Cough        Sucralfate Rash and Other (See Comments)    Blurry vision  Blurry vision     Doxycycline Nausea Only    nausea   Gabapentin Other (See Comments)    Muscle pain, fatigue   Other    Tyloxapol Other (See Comments)   Amoxicillin Nausea Only   Codeine Nausea Only and Other (See Comments)    Hot flashes also   Hydrocodone Itching, Nausea Only and Rash   Omeprazole Itching and Rash   Oxycodone Nausea Only, Rash and Nausea And Vomiting   Oxycodone-Acetaminophen Nausea  And Vomiting and Rash   Tramadol Itching, Nausea Only and Rash   Current Outpatient Medications on File Prior to Visit  Medication Sig Dispense Refill   acetaminophen (TYLENOL) 500 MG tablet Take 1,000 mg by mouth daily as needed for moderate pain or headache.     aspirin EC 81 MG tablet Take 81 mg by mouth daily. Swallow whole.     Cholecalciferol (THERA-D 2000) 50 MCG (2000 UT) TABS 1 tab by mouth once daily 30 tablet 99   esomeprazole (NEXIUM) 20 MG capsule Take 20 mg by mouth daily.     estradiol (ESTRACE) 0.5 MG tablet Take 0.5 mg by mouth daily.     gabapentin (NEURONTIN) 100 MG capsule Take 2 capsules (200 mg total) by mouth 3 (three) times daily as needed. 180 capsule 5   glucose blood (ONETOUCH VERIO) test strip Check blood sugar 1 time daily 100 each 3   hydrocortisone 2.5 % cream      ketoconazole (NIZORAL) 2 % cream      losartan (COZAAR) 100 MG tablet TAKE 1 TABLET BY MOUTH EVERY DAY 30 tablet 4   meloxicam (MOBIC) 7.5 MG tablet TAKE 1 TABLET BY MOUTH EVERY DAY 30 tablet 0   Probiotic CAPS Take 1 capsule by mouth daily.     promethazine (PHENERGAN) 25 MG tablet Take 25 mg by mouth daily as needed for nausea or vomiting.     rosuvastatin (CRESTOR) 10 MG tablet TAKE 1 TABLET BY MOUTH EVERY DAY 30 tablet 11   tirzepatide (MOUNJARO) 7.5 MG/0.5ML Pen Inject 7.5 mg into the skin once a week. 6 mL 3   vitamin B-12 (CYANOCOBALAMIN) 100 MCG tablet Take 100 mcg by mouth daily.     cetirizine (ZYRTEC) 10 MG tablet Take 1 tablet (10 mg total) by mouth daily. 30 tablet 11   No current facility-administered medications on file prior to visit.        ROS:  All others reviewed and negative.  Objective        PE:  BP 124/66 (BP Location: Left Arm, Patient Position: Sitting, Cuff Size: Large)   Pulse 79   Temp 97.9 F (36.6 C) (Oral)   Ht '5\' 5"'$  (1.651 m)   Wt 201 lb (91.2 kg)   SpO2 97%   BMI 33.45 kg/m                 Constitutional: Pt appears in NAD  HENT: Head:  NCAT.                Right Ear: External ear normal.                 Left Ear: External ear normal.                Eyes: . Pupils are equal, round, and reactive to light. Conjunctivae and EOM are normal               Nose: without d/c or deformity               Neck: Neck supple. Gross normal ROM               Cardiovascular: Normal rate and regular rhythm.                 Pulmonary/Chest: Effort normal and breath sounds without rales or wheezing.                Abd:  Soft, NT, ND, + BS, no organomegaly               Neurological: Pt is alert. At baseline orientation, motor grossly intact               Skin: Skin is warm. No rashes, no other new lesions, LE edema - none               Psychiatric: Pt behavior is normal without agitation , mild nervous  Micro: none  Cardiac tracings I have personally interpreted today:  none  Pertinent Radiological findings (summarize): none   Lab Results  Component Value Date   WBC 9.8 03/06/2022   HGB 13.6 03/06/2022   HCT 40.3 03/06/2022   PLT 212 03/06/2022   GLUCOSE 195 (H) 03/06/2022   CHOL 117 05/29/2022   TRIG 90 05/29/2022   HDL 51 05/29/2022   LDLDIRECT 124.0 05/26/2020   LDLCALC 49 05/29/2022   ALT 28 03/06/2022   AST 23 03/06/2022   NA 141 03/06/2022   K 4.5 03/06/2022   CL 101 03/06/2022   CREATININE 0.78 03/06/2022   BUN 8 03/06/2022   CO2 22 03/06/2022   TSH 0.671 05/29/2022   HGBA1C 6.8 (H) 05/29/2022   MICROALBUR <0.7 05/28/2021   Assessment/Plan:  [name] is a 62 y.o. White or Caucasian [1] female with  has a past medical history of Anemia, ANEMIA-IRON DEFICIENCY (01/03/2007), Anxiety, ANXIETY (01/03/2007), Back pain, Complication of anesthesia, Depression, DEPRESSION (01/03/2007), Diabetes ([place]), DJD (degenerative joint disease), Edema of both lower extremities, Enlarged thyroid (10/18/2019), Family history of adverse reaction to anesthesia, Fatty liver, Foot pain, GERD (01/16/2009), GERD (gastroesophageal reflux  disease), Headache(784.0) (01/03/2007), Hyperplastic colon polyp, HYPERSOMNIA (04/04/2010), Hypertension, IBS (01/16/2009), IBS (irritable bowel syndrome), Impaired glucose tolerance (05/18/2011), Knee pain, Menopause, Migraine, Obesity, OSTEOARTHRITIS, KNEES, 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  Hypertension, uncontrolled BP Readings from Last 3 Encounters:  08/26/22 124/66  08/08/22 119/67  06/18/22 133/83   Stable, pt to continue medical treatment losartan 100 mg qd   Type 2 diabetes mellitus (HCC) Lab Results  Component Value Date   HGBA1C 6.8 (H) 05/29/2022   Stable, pt to continue current medical treatment mounjaro 7,5 mg weekly, consdier increased to 10 mg in 1-2 mo if tolerating well   Vitamin D deficiency Last vitamin D Lab Results  Component Value Date   VD25OH 43.6 05/29/2022   Stable, cont oral  replacement   Insomnia New recent onset worsening, has had [place] she did not do well with in past several yrs ago, ok for trazodone 50 qhs prn,  to f/u any worsening symptoms or concerns  Followup: Return if symptoms worsen or fail to improve.  Cathlean Cower, MD 08/26/2022 8:29 PM [facility]

## 2022-08-26 NOTE — Assessment & Plan Note (Signed)
BP Readings from Last 3 Encounters:  08/26/22 124/66  08/08/22 119/67  06/18/22 133/83   Stable, pt to continue medical treatment losartan 100 mg qd

## 2022-08-26 NOTE — Assessment & Plan Note (Signed)
New recent onset worsening, has had [place] she did not do well with in past several yrs ago, ok for trazodone 50 qhs prn,  to f/u any worsening symptoms or concerns

## 2022-08-26 NOTE — Assessment & Plan Note (Signed)
Last vitamin D Lab Results  Component Value Date   VD25OH 43.6 05/29/2022   Stable, cont oral replacement

## 2022-08-27 NOTE — Progress Notes (Unsigned)
Name: [name]  MRN/ DOB: [id number], [birth year]   Age/ Sex: 62 y.o., female    PCP: Biagio Borg, MD   Reason for Endocrinology Evaluation: Type 2 Diabetes Mellitus     Date of Initial Endocrinology Visit: 04/15/2022    PATIENT IDENTIFIER: [name] is a 62 y.o. female with a past medical history of HTN, DM. The patient presented for initial endocrinology clinic visit on 04/15/2022 for consultative assistance with her diabetes management.    HPI: [name]  is here with her daughter    Diagnosed with DM 2019 with an A1c 2019           Hemoglobin A1c has ranged from 6.6% in 2019, peaking at 7.7% in 2023.  MULTINODULAR GOITER: She has been diagnosed with multinodular goiter based on thyroid ultrasound in 2021.  She is s/p benign FNA of the left inferior nodule in April 2021  She sees [facility] for estrogen management.   SUBJECTIVE:   During the last visit (04/15/2022): A1c 8.2%  Today (08/28/22): [name] is here for follow-up on diabetes management and multinodular goiter.  She is accompanied by her daughter today.  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  Denies: CKD Last eye exam: Completed  07/2022  Macrovascular complications:   Denies: CAD, PVD, CVA   PAST HISTORY: Past Medical History:  Past Medical History:  Diagnosis Date   Anemia    ANEMIA-IRON DEFICIENCY 01/03/2007   Anxiety    ANXIETY 01/03/2007   Back pain    Complication of  anesthesia    severe post-operative nausea except for bladder surgery in 2002 at [place] not have complications then   Depression    DEPRESSION 01/03/2007   Diabetes ([place])    DJD (degenerative joint disease)    knee   Edema of both lower extremities    Enlarged thyroid 10/18/2019   Family history of adverse reaction to anesthesia    mother and father both had severe post-operative nausea and vomiting   Fatty liver    Foot pain    GERD 01/16/2009   GERD (gastroesophageal reflux disease)    Headache(784.0) 01/03/2007   Hyperplastic colon polyp    04/2010   HYPERSOMNIA 04/04/2010   Hypertension    IBS 01/16/2009   IBS (irritable bowel syndrome)    Impaired glucose tolerance 05/18/2011   Knee pain    Menopause    early   Migraine    Obesity    OSTEOARTHRITIS, KNEES, 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 She has never used smokeless tobacco. She  reports that she does not drink alcohol and does not use drugs. Family History:  Family History  Problem Relation Age of Onset   Hypertension Mother    Diabetes Brother      HOME MEDICATIONS: Allergies as of 08/28/2022       Reactions   Meperidine Shortness Of Breath   Morphine Anaphylaxis   Penicillins Hives, Nausea Only, Swelling, Other (See Comments), Anaphylaxis, Nausea And Vomiting   PATIENT HAS HAD A PCN REACTION WITH IMMEDIATE RASH, FACIAL/TONGUE/THROAT SWELLING, SOB, OR LIGHTHEADEDNESS WITH HYPOTENSION:  #  #  YES  #  #  HAS PT DEVELOPED SEVERE RASH INVOLVING MUCUS MEMBRANES or SKIN NECROSIS: #  #  YES  #  #  PATIENT HAS HAD A PCN REACTION THAT REQUIRED HOSPITALIZATION:  #  #  YES   #  #  Has patient had a PCN reaction occurring within the last 10 years: No vomiting PATIENT HAS HAD A PCN REACTION WITH IMMEDIATE RASH, FACIAL/TONGUE/THROAT SWELLING, SOB, OR LIGHTHEADEDNESS WITH HYPOTENSION:  #  #  YES  #  #  HAS PT DEVELOPED SEVERE RASH INVOLVING MUCUS MEMBRANES or SKIN NECROSIS: #  #  YES  #  #  PATIENT HAS HAD A PCN REACTION THAT REQUIRED HOSPITALIZATION:  #  #  YES  #  #  Has patient had a PCN reaction occurring within the last 10 years: No   Azithromycin Nausea And Vomiting, Nausea Only   Lisinopril Cough       Sucralfate Rash, Other (See Comments)   Blurry vision  Blurry vision    Doxycycline Nausea Only   nausea   Gabapentin Other (See Comments)   Muscle pain, fatigue   Other    Tyloxapol Other (See Comments)   Amoxicillin Nausea Only   Codeine Nausea Only, Other (See Comments)   Hot flashes also   Hydrocodone Itching, Nausea Only, Rash   Omeprazole Itching, Rash   Oxycodone Nausea Only, Rash, Nausea And Vomiting   Oxycodone-acetaminophen Nausea And Vomiting, Rash   Tramadol Itching, Nausea Only, Rash        Medication List        Accurate as of August 28, 2022  9:35 AM. If you have any questions, ask your nurse or doctor.          STOP taking these medications    meloxicam 7.5 MG tablet Commonly known as: MOBIC Stopped by: Dorita Sciara, MD   traZODone 50 MG tablet Commonly known as: DESYREL Stopped by: Dorita Sciara, MD       TAKE these medications    acetaminophen 500 MG tablet Commonly known as: TYLENOL Take 1,000 mg by mouth daily as needed for moderate pain or headache.   aspirin EC 81 MG tablet Take 81 mg by mouth daily. Swallow whole.   cetirizine 10 MG tablet Commonly known as: ZYRTEC Take 1 tablet (10 mg total) by mouth daily.   esomeprazole 20 MG capsule Commonly known as: NEXIUM Take 20 mg by mouth daily.   estradiol 0.5 MG tablet Commonly known as: ESTRACE Take 0.5 mg by mouth daily.    gabapentin 100 MG capsule Commonly known as: NEURONTIN Take 2 capsules (200 mg total) by mouth 3 (three) times daily as needed.   hydrocortisone 2.5 % cream   ketoconazole 2 % cream Commonly known as: NIZORAL   losartan 100 MG tablet Commonly known as: COZAAR TAKE 1 TABLET BY MOUTH EVERY DAY   OneTouch Verio test strip Generic drug: glucose  blood Check blood sugar 1 time daily   Probiotic Caps Take 1 capsule by mouth daily.   promethazine 25 MG tablet Commonly known as: PHENERGAN Take 25 mg by mouth daily as needed for nausea or vomiting.   rosuvastatin 10 MG tablet Commonly known as: CRESTOR TAKE 1 TABLET BY MOUTH EVERY DAY   Thera-D 2000 50 MCG (2000 UT) Tabs Generic drug: Cholecalciferol 1 tab by mouth once daily   tirzepatide 7.5 MG/0.5ML Pen Commonly known as: MOUNJARO Inject 7.5 mg into the skin once a week.   vitamin B-12 100 MCG tablet Commonly known as: CYANOCOBALAMIN Take 100 mcg by mouth daily.         ALLERGIES: Allergies  Allergen Reactions   Meperidine Shortness Of Breath   Morphine Anaphylaxis   Penicillins Hives, Nausea Only, Swelling, Other (See Comments), Anaphylaxis and Nausea And Vomiting    PATIENT HAS HAD A PCN REACTION WITH IMMEDIATE RASH, FACIAL/TONGUE/THROAT SWELLING, SOB, OR LIGHTHEADEDNESS WITH HYPOTENSION:  #  #  YES  #  #  HAS PT DEVELOPED SEVERE RASH INVOLVING MUCUS MEMBRANES or SKIN NECROSIS: #  #  YES  #  #  PATIENT HAS HAD A PCN REACTION THAT REQUIRED HOSPITALIZATION:  #  #  YES  #  #  Has patient had a PCN reaction occurring within the last 10 years: No  vomiting PATIENT HAS HAD A PCN REACTION WITH IMMEDIATE RASH, FACIAL/TONGUE/THROAT SWELLING, SOB, OR LIGHTHEADEDNESS WITH HYPOTENSION:  #  #  YES  #  #  HAS PT DEVELOPED SEVERE RASH INVOLVING MUCUS MEMBRANES or SKIN NECROSIS: #  #  YES  #  #  PATIENT HAS HAD A PCN REACTION THAT REQUIRED HOSPITALIZATION:  #  #  YES  #  #  Has patient had a PCN reaction occurring within the  last 10 years: No   Azithromycin Nausea And Vomiting and Nausea Only   Lisinopril Cough        Sucralfate Rash and Other (See Comments)    Blurry vision  Blurry vision     Doxycycline Nausea Only    nausea   Gabapentin Other (See Comments)    Muscle pain, fatigue   Other    Tyloxapol Other (See Comments)   Amoxicillin Nausea Only   Codeine Nausea Only and Other (See Comments)    Hot flashes also   Hydrocodone Itching, Nausea Only and Rash   Omeprazole Itching and Rash   Oxycodone Nausea Only, Rash and Nausea And Vomiting   Oxycodone-Acetaminophen Nausea And Vomiting and Rash   Tramadol Itching, Nausea Only and Rash     REVIEW OF SYSTEMS: A comprehensive ROS was conducted with the patient and is negative except as per HPI    OBJECTIVE:   VITAL SIGNS: BP 120/70 (BP Location: Left Arm, Patient Position: Sitting, Cuff Size: Large)   Pulse 70   Ht '5\' 5"'$  (1.651 m)   Wt 201 lb (91.2 kg)   SpO2 96%   BMI 33.45 kg/m    PHYSICAL EXAM:  General: Pt appears well and is in NAD  Neck: General: Supple without adenopathy or carotid bruits. Thyroid: Thyroid size normal.  No goiter or nodules appreciated.   Lungs: Clear with good BS bilat with no rales, rhonchi, or wheezes  Heart: RRR   Extremities:  Lower extremities - No pretibial edema  Neuro: MS is good with appropriate affect, pt is alert and Ox3    DM foot exam: 06/07/2022   DATA REVIEWED:  Lab Results  Component Value Date   HGBA1C 5.5 08/28/2022   HGBA1C 6.8 (H) 05/29/2022   HGBA1C 8.2 (A) 04/15/2022         Latest Reference Range & Units 12/06/21 09:56  Total CHOL/HDL Ratio  3  Cholesterol 0 - 200 mg/dL 153  HDL Cholesterol >39.00 mg/dL 60.50  LDL (calc) 0 - 99 mg/dL 66  NonHDL  92.28  Triglycerides 0.0 - 149.0 mg/dL 129.0  VLDL 0.0 - 40.0 mg/dL 25.8    Latest Reference Range & Units 03/06/22 09:36  Sodium 134 - 144 mmol/L 141  Potassium 3.5 - 5.2 mmol/L 4.5  Chloride 96 - 106 mmol/L 101  CO2 20 - 29  mmol/L 22  Glucose 70 - 99 mg/dL 195 (H)  BUN 8 - 27 mg/dL 8  Creatinine 0.57 - 1.00 mg/dL 0.78  Calcium 8.7 - 10.3 mg/dL 10.2  BUN/Creatinine Ratio 12 - 28  10 (L)  eGFR >59 mL/min/1.73 86  Alkaline Phosphatase 44 - 121 IU/L 69  Albumin 3.9 - 4.9 g/dL 4.5  Albumin/Globulin Ratio 1.2 - 2.2  1.6  AST 0 - 40 IU/L 23  ALT 0 - 32 IU/L 28  Total Protein 6.0 - 8.5 g/dL 7.3  Total Bilirubin 0.0 - 1.2 mg/dL 0.4    Latest Reference Range & Units Most Recent  TSH 0.450 - 4.500 uIU/mL 0.671 05/29/22 14:24     THYROID ULTRASOUND 09/12/2021  Estimated total number of nodules >/= 1 cm: 3   Number of spongiform nodules >/=  2 cm not described below (TR1): 0   Number of mixed cystic and solid nodules >/= 1.5 cm not described below ([place]): 0   _________________________________________________________   Nodule 1, superior right thyroid, spongiform and does not meet criteria for further surveillance.   Nodule 2, cystic, 6 mm, and does not meet criteria for further surveillance.   Nodule 3, right inferior thyroid, 9 mm and does not meet criteria for surveillance.   Nodule labeled 4 superior left thyroid, 6 mm, does not meet criteria for further surveillance.   Nodule labeled 5, 1.3 cm, slightly smaller than previous and remains TR 4. Nodule meets criteria for continued surveillance.   Nodule labeled 6, 1.6 cm, slightly decreased from the prior, previously biopsied. Assuming benign result, no further specific follow-up would be indicated.   No adenopathy   IMPRESSION: Multinodular thyroid again demonstrated.   Left mid thyroid nodule (labeled 5 on the current study, 1.3 cm, TR 4) meets criteria for continued surveillance, as designated by the newly established ACR TI-RADS criteria. Surveillance ultrasound study recommended to be performed annually up to 5 years.   FNA left inferior thyroid nodule 10/14/2019   Clinical History: None provided  Specimen Submitted:  A.  THYROID,LEFT INFERIOR, FINE NEEDLE ASPIRATION:    FINAL MICROSCOPIC DIAGNOSIS:  - Consistent with benign follicular nodule ([place] category II)   ASSESSMENT / PLAN / RECOMMENDATIONS:   1) Type 2 Diabetes Mellitus, Optimally  controlled, With neuropathic  complications - Most recent A1c of 5.5%. Goal A1c < 7.0 %.    -I have placed the patient on weight loss, and continued lifestyle modifications with low-carb diet and regular exercise -Her A1c has trended down from 8.2% to 5.5% -She is tolerating Mounjaro without side effect -No change at this time    MEDICATIONS: Continue Mounjaro 7.5 mg weekly  EDUCATION / INSTRUCTIONS: BG monitoring instructions: Patient is instructed to check her blood sugars 2-3 times a week. Call [facility] if: BG persistently < 70  I reviewed the Rule of 15 for the treatment of hypoglycemia in detail with the patient. Literature supplied.   2) Diabetic complications:  Eye: Does not have known diabetic retinopathy.  Neuro/ Feet: Does have known diabetic peripheral neuropathy. Renal: Patient does not have known baseline CKD. She is  on an ACEI/ARB at present.  3) MULTINODULAR GOITER:  -No local neck symptoms -She is s/p benign FNA of the left thyroid nodule in 2021 -Pt under the impression she is scheduled for a thyroid ultrasound next week but its actually a CT of the neck  which is not a good modality to monitor nodules but its  a good modality for soft tissue evaluation  -I have ordered thyroid ultrasound for the nodules -TSH was normal 05/2022  Follow-up in 6 months   Signed electronically by: Mack Guise, MD  [facility]  [facility] [address] [place], [place] [address] Phone: [phone] FAX: [phone]   CC: Biagio Borg, MD [place] [address] Phone: [phone]  Fax: [phone]    Return to Endocrinology clinic as below: Future  Appointments  Date Time Provider [place]  08/28/2022  4:20 PM Thomes Dinning, MD [facility] None  09/05/2022  2:00 PM [facility] 1 [facility] [address] [facility]  12/04/2022  9:40 AM Biagio Borg, MD [id number] None  01/06/2023  8:40 AM Skeet Latch, MD [facility]

## 2022-08-28 ENCOUNTER — Ambulatory Visit: Payer: 59 | Admitting: Internal Medicine

## 2022-08-28 ENCOUNTER — Ambulatory Visit (INDEPENDENT_AMBULATORY_CARE_PROVIDER_SITE_OTHER): Payer: 59 | Admitting: Internal Medicine

## 2022-08-28 ENCOUNTER — Encounter (INDEPENDENT_AMBULATORY_CARE_PROVIDER_SITE_OTHER): Payer: Self-pay | Admitting: Internal Medicine

## 2022-08-28 ENCOUNTER — Encounter: Payer: Self-pay | Admitting: Internal Medicine

## 2022-08-28 VITALS — BP 122/62 | HR 90 | Temp 98.0°F | Ht 65.0 in | Wt 196.0 lb

## 2022-08-28 VITALS — BP 120/70 | HR 70 | Ht 65.0 in | Wt 201.0 lb

## 2022-08-28 DIAGNOSIS — Z7985 Long-term (current) use of injectable non-insulin antidiabetic drugs: Secondary | ICD-10-CM

## 2022-08-28 DIAGNOSIS — F5104 Psychophysiologic insomnia: Secondary | ICD-10-CM | POA: Diagnosis not present

## 2022-08-28 DIAGNOSIS — E669 Obesity, unspecified: Secondary | ICD-10-CM

## 2022-08-28 DIAGNOSIS — Z6832 Body mass index (BMI) 32.0-32.9, adult: Secondary | ICD-10-CM | POA: Diagnosis not present

## 2022-08-28 DIAGNOSIS — E042 Nontoxic multinodular goiter: Secondary | ICD-10-CM | POA: Diagnosis not present

## 2022-08-28 DIAGNOSIS — E1169 Type 2 diabetes mellitus with other specified complication: Secondary | ICD-10-CM | POA: Diagnosis not present

## 2022-08-28 DIAGNOSIS — E1142 Type 2 diabetes mellitus with diabetic polyneuropathy: Secondary | ICD-10-CM | POA: Diagnosis not present

## 2022-08-28 LAB — POCT GLYCOSYLATED HEMOGLOBIN (HGB A1C): Hemoglobin A1C: 5.5 % (ref 4.0–5.6)

## 2022-08-28 LAB — MICROALBUMIN / CREATININE URINE RATIO
Creatinine,U: 336.7 mg/dL
Microalb Creat Ratio: 0.5 mg/g (ref 0.0–30.0)
Microalb, Ur: 1.7 mg/dL (ref 0.0–1.9)

## 2022-08-28 NOTE — Progress Notes (Signed)
Office: [phone]  /  Fax: [phone]  WEIGHT SUMMARY AND BIOMETRICS  Vitals Temp: 98 F (36.7 C) BP: 122/62 Pulse Rate: 90 SpO2: 98 %   Anthropometric Measurements Height: '5\' 5"'$  (1.651 m) Weight: 196 lb (88.9 kg) BMI (Calculated): 32.62 Weight at Last Visit: 202 lb Weight Lost Since Last Visit: 6 lb Starting Weight: 215 lb Total Weight Loss (lbs): 19 lb (8.618 kg) Peak Weight: 250 lb   Body Composition  Body Fat %: 44.1 % Fat Mass (lbs): 86.8 lbs Muscle Mass (lbs): 104.4 lbs Total Body Water (lbs): 72 lbs Visceral Fat Rating : 12    HPI  Chief Complaint: OBESITY  [name] is here to discuss her progress with her obesity treatment plan. She is on the the Category 1 Plan and states she is following her eating plan approximately 60 % of the time. She states she is exercising 60 minutes 5 times per week.  Interval History:  Since last office visit she has has lost 6 pounds. She reports good adherence to reduced calorie nutritional plan. She has been working on increasing protein with her meals and not skipping meals.  She still having difficulty sleeping.  Her husband snores loudly and is interrupted.  She then uses her phone while she cannot sleep.  She has been doing strength training with her son. '[x]'$ Denies '[]'$ Reports problems with appetite and hunger signals.  '[x]'$ Denies '[]'$ Reports problems with satiety and satiation.  '[x]'$ Denies '[]'$ Reports problems with eating patterns and portion control.  '[x]'$ Denies '[]'$ Reports abnormal cravings Sleeping approximately 5 hours a day.  Sleep described as: '[]'$ Restorative '[x]'$ Unrestorative '[x]'$ Interrupted Stress levels are reported as low and manageable.  Barriers identified none.   Pharmacotherapy for weight loss: She is currently taking no anti-obesity medication and Monjauro with diabetes as the primary indication .   Weight promoting medications identified: Antiepileptics and Contraceptives or hormonal therapy.  ASSESSMENT AND  PLAN  TREATMENT PLAN FOR OBESITY:  Recommended Dietary Goals  [name] is currently in the action stage of change. As such, her goal is to continue weight management plan. She has agreed to: the Category 1 Plan.  Behavioral Intervention  We discussed the following Behavioral Modification Strategies today: increasing lean protein intake, increasing vegetables, increasing water intake, work on meal planning and easy cooking plans, reading food labels , and work on managing stress, creating time for self-care and relaxation measures.  Additional resources provided today:  Provided handout on sleep hygiene  Recommended Physical Activity Goals  [name] has been advised to work up to 150 minutes of moderate intensity aerobic activity a week and strengthening exercises 2-3 times per week for cardiovascular health, weight loss maintenance and preservation of muscle mass.   She has agreed to :  '[x]'$  Continue current level of physical activity  '[]'$  Think about ways to increase physical activity '[]'$  Start strengthening exercises with a goal of 2-3 sessions a week  '[]'$  Start aerobic activity with a goal of 150 minutes a week at moderate intensity.  '[]'$  Increase the intensity, frequency or duration of strengthening exercises  '[]'$  Increase the intensity, frequency or duration of aerobic exercises   '[]'$  Increase physical activity in their day and reduce sedentary time (increase NEAT). '[]'$  Work on scheduling and tracking physical activity.   Pharmacotherapy We discussed various medication options to help [name] with her weight loss efforts and we both agreed to : continue with nutritional and behavioral strategies and continue current anti-obesity medication regimen  ASSOCIATED CONDITIONS ADDRESSED TODAY  Type 2 diabetes mellitus  with other specified complication, without long-term current use of insulin ([place]) Assessment & Plan: Well-controlled most recent A1c is 5.5.  On Mounjaro without any adverse  effects.  She denies any symptoms of hypoglycemia.  She also continues to lose weight Lab Results  Component Value Date   HGBA1C 5.5 08/28/2022   HGBA1C 6.8 (H) 05/29/2022   HGBA1C 8.2 (A) 04/15/2022   Lab Results  Component Value Date   MICROALBUR 1.7 08/28/2022   LDLCALC 49 05/29/2022   CREATININE 0.78 03/06/2022   Continue with nutritional strategies and Mounjaro at current dose   Psychophysiological insomnia Assessment & Plan: Patient counseled on sleep hygiene and was provided with additional information.  Her sleep is significantly disrupted by her husband's loud snoring and then she is also distracting herself with her phone which may be compounding the problem.  She will benefit from separate sleeping quarters and she will try to use a spare bedroom.  We also advised to avoid exposure to bluelight 1 hour before bedtime.  She may also try magnesium glycinate 1 hour before bedtime   Obesity,current BMI 32     PHYSICAL EXAM:  Blood pressure 122/62, pulse 90, temperature 98 F (36.7 C), height '5\' 5"'$  (1.651 m), weight 196 lb (88.9 kg), SpO2 98 %. Body mass index is 32.62 kg/m.  General: She is overweight, cooperative, alert, well developed, and in no acute distress. PSYCH: Has normal mood, affect and thought process.   HEENT: EOMI, sclerae are anicteric. Lungs: Normal breathing effort, no conversational dyspnea. Extremities: No edema.  Neurologic: No gross sensory or motor deficits. No tremors or fasciculations noted.    DIAGNOSTIC DATA REVIEWED:  BMET    Component Value Date/Time   NA 141 03/06/2022 0936   K 4.5 03/06/2022 0936   CL 101 03/06/2022 0936   CO2 22 03/06/2022 0936   GLUCOSE 195 (H) 03/06/2022 0936   GLUCOSE 154 (H) 12/06/2021 0956   BUN 8 03/06/2022 0936   CREATININE 0.78 03/06/2022 0936   CREATININE 0.73 04/10/2015 1659   CALCIUM 10.2 03/06/2022 0936   GFRNONAA >60 08/10/2021 1144   GFRAA 103 09/22/2019 1017   Lab Results  Component Value  Date   HGBA1C 5.5 08/28/2022   HGBA1C 5.9 01/07/2007   Lab Results  Component Value Date   INSULIN 30.1 (H) 05/30/2022   Lab Results  Component Value Date   TSH 0.671 05/29/2022   CBC    Component Value Date/Time   WBC 9.8 03/06/2022 0936   WBC 8.2 12/06/2021 0956   RBC 4.58 03/06/2022 0936   RBC 4.68 12/06/2021 0956   HGB 13.6 03/06/2022 0936   HCT 40.3 03/06/2022 0936   PLT 212 03/06/2022 0936   MCV 88 03/06/2022 0936   MCH 29.7 03/06/2022 0936   MCH 28.6 08/10/2021 1144   MCHC 33.7 03/06/2022 0936   MCHC 32.7 12/06/2021 0956   RDW 12.9 03/06/2022 0936   Iron Studies    Component Value Date/Time   IRON 68 05/28/2021 0917   TIBC 492.8 (H) 05/28/2021 0917   FERRITIN 57.6 05/28/2021 0917   IRONPCTSAT 13.8 (L) 05/28/2021 0917   Lipid Panel     Component Value Date/Time   CHOL 117 05/29/2022 1424   TRIG 90 05/29/2022 1424   HDL 51 05/29/2022 1424   CHOLHDL 3 12/06/2021 0956   VLDL 25.8 12/06/2021 0956   LDLCALC 49 05/29/2022 1424   LDLDIRECT 124.0 05/26/2020 1009   Hepatic Function Panel     Component Value Date/Time  PROT 7.3 03/06/2022 0936   ALBUMIN 4.5 03/06/2022 0936   AST 23 03/06/2022 0936   ALT 28 03/06/2022 0936   ALKPHOS 69 03/06/2022 0936   BILITOT 0.4 03/06/2022 0936   BILIDIR 0.1 12/06/2021 0956      Component Value Date/Time   TSH 0.671 05/29/2022 1424   Nutritional Lab Results  Component Value Date   VD25OH 43.6 05/29/2022   VD25OH 35.68 12/06/2021   VD25OH 23.95 (L) 05/28/2021     Return in about 3 weeks (around 09/18/2022) for For Weight Mangement with Dr. Gerarda Fraction.Marland Kitchen She was informed of the importance of frequent follow up visits to maximize her success with intensive lifestyle modifications for her multiple health conditions.   ATTESTASTION STATEMENTS:  Reviewed by clinician on day of visit: allergies, medications, problem list, medical history, surgical history, family history, social history, and previous encounter  notes.     Thomes Dinning, MD

## 2022-08-28 NOTE — Assessment & Plan Note (Signed)
Patient counseled on sleep hygiene and was provided with additional information.  Her sleep is significantly disrupted by her husband's loud snoring and then she is also distracting herself with her phone which may be compounding the problem.  She will benefit from separate sleeping quarters and she will try to use a spare bedroom.  We also advised to avoid exposure to bluelight 1 hour before bedtime.  She may also try magnesium glycinate 1 hour before bedtime

## 2022-08-28 NOTE — Assessment & Plan Note (Signed)
Well-controlled most recent A1c is 5.5.  On Mounjaro without any adverse effects.  She denies any symptoms of hypoglycemia.  She also continues to lose weight Lab Results  Component Value Date   HGBA1C 5.5 08/28/2022   HGBA1C 6.8 (H) 05/29/2022   HGBA1C 8.2 (A) 04/15/2022   Lab Results  Component Value Date   MICROALBUR 1.7 08/28/2022   LDLCALC 49 05/29/2022   CREATININE 0.78 03/06/2022   Continue with nutritional strategies and Mounjaro at current dose

## 2022-09-05 ENCOUNTER — Other Ambulatory Visit: Payer: 59

## 2022-09-09 ENCOUNTER — Ambulatory Visit
Admission: RE | Admit: 2022-09-09 | Discharge: 2022-09-09 | Disposition: A | Discharge status: Home / Self Care | Payer: 59 | Source: Ambulatory Visit | Attending: Internal Medicine | Admitting: Internal Medicine

## 2022-09-09 DIAGNOSIS — R221 Localized swelling, mass and lump, neck: Secondary | ICD-10-CM

## 2022-09-12 ENCOUNTER — Other Ambulatory Visit: Payer: Self-pay | Admitting: Internal Medicine

## 2022-09-18 ENCOUNTER — Ambulatory Visit
Admission: RE | Admit: 2022-09-18 | Discharge: 2022-09-18 | Disposition: A | Discharge status: Home / Self Care | Payer: 59 | Source: Ambulatory Visit | Attending: Internal Medicine | Admitting: Internal Medicine

## 2022-09-18 DIAGNOSIS — E042 Nontoxic multinodular goiter: Secondary | ICD-10-CM

## 2022-09-19 ENCOUNTER — Ambulatory Visit (INDEPENDENT_AMBULATORY_CARE_PROVIDER_SITE_OTHER): Payer: 59 | Admitting: Internal Medicine

## 2022-10-11 ENCOUNTER — Encounter: Payer: Self-pay | Admitting: Internal Medicine

## 2022-10-11 ENCOUNTER — Ambulatory Visit: Payer: 59 | Admitting: Internal Medicine

## 2022-10-11 VITALS — BP 114/72 | HR 80 | Ht 65.0 in | Wt 194.2 lb

## 2022-10-11 DIAGNOSIS — E1142 Type 2 diabetes mellitus with diabetic polyneuropathy: Secondary | ICD-10-CM

## 2022-10-11 DIAGNOSIS — E042 Nontoxic multinodular goiter: Secondary | ICD-10-CM

## 2022-10-11 DIAGNOSIS — S139XXA Sprain of joints and ligaments of unspecified parts of neck, initial encounter: Secondary | ICD-10-CM | POA: Insufficient documentation

## 2022-10-11 DIAGNOSIS — S139XXD Sprain of joints and ligaments of unspecified parts of neck, subsequent encounter: Secondary | ICD-10-CM | POA: Diagnosis not present

## 2022-10-11 NOTE — Patient Instructions (Signed)
Please use a heating pad to the left of the neck  Take Tylenol 2-3 daily for the next 3 days

## 2022-10-11 NOTE — Progress Notes (Signed)
Name: [name]  MRN/ DOB: [id number], 1961-02-09   Age/ Sex: 62 y.o., female    PCP: Stacey Levins, [name]   Reason for Endocrinology Evaluation: Type 2 Diabetes Mellitus     Date of Initial Endocrinology Visit: 04/15/2022    PATIENT IDENTIFIER: Ms. [name] is a 62 y.o. female with a past medical history of HTN, DM. The patient presented for initial endocrinology clinic visit on 04/15/2022 for consultative assistance with her diabetes management.    HPI: [name]  is here with her daughter    Diagnosed with DM 2019 with an A1c 2019           Hemoglobin A1c has ranged from 6.6% in 2019, peaking at 7.7% in 2023.  MULTINODULAR GOITER: She has been diagnosed with multinodular goiter based on thyroid ultrasound in 2021.  She is s/p benign FNA of the left inferior nodule in April 2021  She sees [facility] for estrogen management.   SUBJECTIVE:   During the last visit (08/28/2022): A1c 5.5 %    Today (10/11/22): [name] is here mainly to discuss her neck pain.  She also follow-up on diabetes management and multinodular goiter. She checks her blood sugars occasionally.  She is complaining of left-sided posterior neck pain that started approximately a month ago.  She was seen by dermatology recently, and skin cancer was removed from the left side of the neck CT of the neck 09/09/2022 unremarkable except for mild arthritic changes at the lower cervical spine and sinus mucosal thickening She was diagnosed with muscle strain by dermatology, she took Tylenol for the pain  She continues to follow-up with healthy weight and wellness clinic 08/08/2022 She was seen by podiatry 07/13/2022 for tinea pedis of the right foot Weight continues to trend down She denies any nausea, vomiting or diarrhea    HOME DIABETES REGIMEN: Tirzepatide 7.5 mg weekly     Statin: yes ACE-I/ARB: yes    METER DOWNLOAD SUMMARY: n/a  DIABETIC COMPLICATIONS: Microvascular complications:   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  Denies: CKD Last eye exam: Completed  07/2022  Macrovascular complications:   Denies: CAD, PVD, CVA   PAST HISTORY: Past Medical History:  Past Medical History:  Diagnosis Date   Anemia    ANEMIA-IRON DEFICIENCY 01/03/2007   Anxiety    ANXIETY 01/03/2007   Back pain    Complication of anesthesia    severe post-operative nausea except for bladder surgery in 2002 at [facility]-did not have complications then   Depression    DEPRESSION 01/03/2007   Diabetes    DJD (degenerative joint disease)    knee   Edema of both lower extremities    Enlarged thyroid 10/18/2019   Family history of adverse reaction to anesthesia    mother and father both had severe post-operative nausea and vomiting   Fatty liver    Foot pain    GERD 01/16/2009   GERD (gastroesophageal reflux disease)    Headache(784.0) 01/03/2007   Hyperplastic colon polyp    04/2010   HYPERSOMNIA 04/04/2010   Hypertension    IBS 01/16/2009   IBS (irritable bowel syndrome)    Impaired glucose tolerance 05/18/2011   Knee pain    Menopause    early   Migraine    Obesity    OSTEOARTHRITIS, KNEES, 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 She has never used smokeless tobacco. She reports that she does not drink alcohol and does not use drugs. Family History:  Family History  Problem Relation Age of Onset   Hypertension Mother    Diabetes Brother      HOME MEDICATIONS: Allergies as of 10/11/2022       Reactions   Meperidine Shortness Of Breath   Morphine Anaphylaxis   Penicillins Hives, Nausea Only, Swelling, Other (See Comments), Anaphylaxis,  Nausea And Vomiting   PATIENT HAS HAD A PCN REACTION WITH IMMEDIATE RASH, FACIAL/TONGUE/THROAT SWELLING, SOB, OR LIGHTHEADEDNESS WITH HYPOTENSION:  #  #  YES  #  #  HAS PT DEVELOPED SEVERE RASH INVOLVING MUCUS MEMBRANES or SKIN NECROSIS: #  #  YES  #  #  PATIENT HAS HAD A PCN REACTION THAT REQUIRED HOSPITALIZATION:  #  #  YES  #  #  Has patient had a PCN reaction occurring within the last 10 years: No vomiting PATIENT HAS HAD A PCN REACTION WITH IMMEDIATE RASH, FACIAL/TONGUE/THROAT SWELLING, SOB, OR LIGHTHEADEDNESS WITH HYPOTENSION:  #  #  YES  #  #  HAS PT DEVELOPED SEVERE RASH INVOLVING MUCUS MEMBRANES or SKIN NECROSIS: #  #  YES  #  #  PATIENT HAS HAD A PCN REACTION THAT REQUIRED HOSPITALIZATION:  #  #  YES  #  #  Has patient had a PCN reaction occurring within the last 10 years: No   Azithromycin Nausea And Vomiting, Nausea Only   Lisinopril Cough       Sucralfate Rash, Other (See Comments)   Blurry vision  Blurry vision    Doxycycline Nausea Only   nausea   Gabapentin Other (See Comments)   Muscle pain, fatigue   Other    Tyloxapol Other (See Comments)   Amoxicillin Nausea Only   Codeine Nausea Only, Other (See Comments)   Hot flashes also   Hydrocodone Itching, Nausea Only, Rash   Omeprazole Itching, Rash   Oxycodone Nausea Only, Rash, Nausea And Vomiting   Oxycodone-acetaminophen Nausea And Vomiting, Rash   Tramadol Itching, Nausea Only, Rash        Medication List        Accurate as of October 11, 2022 10:04 AM. If you have any questions, ask your nurse or doctor.          acetaminophen 500 MG tablet Commonly known as: TYLENOL Take 1,000 mg by mouth daily as needed for moderate pain or headache.   aspirin EC 81 MG tablet Take 81 mg by mouth daily. Swallow whole.   cetirizine 10 MG tablet Commonly known as: ZYRTEC Take 1 tablet (10 mg total) by mouth daily.   esomeprazole 20 MG capsule Commonly known as: NEXIUM Take 20 mg by mouth daily.   estradiol 0.5  MG tablet Commonly known as: ESTRACE Take 0.5 mg by mouth daily.   gabapentin 100 MG capsule Commonly known as: NEURONTIN Take 2 capsules (200 mg total) by mouth 3 (three) times daily as needed.   hydrocortisone 2.5 % cream   ketoconazole 2 % cream Commonly known as: NIZORAL   losartan 100 MG tablet Commonly known as: COZAAR TAKE 1 TABLET BY MOUTH EVERY DAY   OneTouch Verio  test strip Generic drug: glucose blood Check blood sugar 1 time daily   Probiotic Caps Take 1 capsule by mouth daily.   promethazine 25 MG tablet Commonly known as: PHENERGAN Take 25 mg by mouth daily as needed for nausea or vomiting.   rosuvastatin 10 MG tablet Commonly known as: CRESTOR TAKE 1 TABLET BY MOUTH EVERY DAY   Thera-D 2000 50 MCG (2000 UT) Tabs Generic drug: Cholecalciferol 1 tab by mouth once daily   tirzepatide 7.5 MG/0.5ML Pen Commonly known as: MOUNJARO Inject 7.5 mg into the skin once a week.   vitamin B-12 100 MCG tablet Commonly known as: CYANOCOBALAMIN Take 100 mcg by mouth daily.         ALLERGIES: Allergies  Allergen Reactions   Meperidine Shortness Of Breath   Morphine Anaphylaxis   Penicillins Hives, Nausea Only, Swelling, Other (See Comments), Anaphylaxis and Nausea And Vomiting    PATIENT HAS HAD A PCN REACTION WITH IMMEDIATE RASH, FACIAL/TONGUE/THROAT SWELLING, SOB, OR LIGHTHEADEDNESS WITH HYPOTENSION:  #  #  YES  #  #  HAS PT DEVELOPED SEVERE RASH INVOLVING MUCUS MEMBRANES or SKIN NECROSIS: #  #  YES  #  #  PATIENT HAS HAD A PCN REACTION THAT REQUIRED HOSPITALIZATION:  #  #  YES  #  #  Has patient had a PCN reaction occurring within the last 10 years: No  vomiting PATIENT HAS HAD A PCN REACTION WITH IMMEDIATE RASH, FACIAL/TONGUE/THROAT SWELLING, SOB, OR LIGHTHEADEDNESS WITH HYPOTENSION:  #  #  YES  #  #  HAS PT DEVELOPED SEVERE RASH INVOLVING MUCUS MEMBRANES or SKIN NECROSIS: #  #  YES  #  #  PATIENT HAS HAD A PCN REACTION THAT REQUIRED HOSPITALIZATION:  #   #  YES  #  #  Has patient had a PCN reaction occurring within the last 10 years: No   Azithromycin Nausea And Vomiting and Nausea Only   Lisinopril Cough        Sucralfate Rash and Other (See Comments)    Blurry vision  Blurry vision     Doxycycline Nausea Only    nausea   Gabapentin Other (See Comments)    Muscle pain, fatigue   Other    Tyloxapol Other (See Comments)   Amoxicillin Nausea Only   Codeine Nausea Only and Other (See Comments)    Hot flashes also   Hydrocodone Itching, Nausea Only and Rash   Omeprazole Itching and Rash   Oxycodone Nausea Only, Rash and Nausea And Vomiting   Oxycodone-Acetaminophen Nausea And Vomiting and Rash   Tramadol Itching, Nausea Only and Rash     REVIEW OF SYSTEMS: A comprehensive ROS was conducted with the patient and is negative except as per HPI    OBJECTIVE:   VITAL SIGNS: BP 114/72 (BP Location: Left Arm, Patient Position: Sitting, Cuff Size: Large)   Pulse 80   Ht 5\' 5"  (1.651 m)   Wt 194 lb 3.2 oz (88.1 kg)   SpO2 98%   BMI 32.32 kg/m    PHYSICAL EXAM:  General: Pt appears well and is in NAD  Neck: General: Patient with tenderness over the lateral aspect of posterior neck, with tenderness with flexion and right neck rotation Thyroid: Thyromegaly noted, no tenderness  Lungs: Clear with good BS bilat with no rales, rhonchi, or wheezes  Heart: RRR   Extremities:  Lower extremities - No pretibial edema  Neuro: MS is good with appropriate affect, pt is alert and Ox3  DM foot exam: 07/13/2022 podiatry   DATA REVIEWED:  Lab Results  Component Value Date   HGBA1C 5.5 08/28/2022   HGBA1C 6.8 (H) 05/29/2022   HGBA1C 8.2 (A) 04/15/2022         Latest Reference Range & Units 12/06/21 09:56  Total CHOL/HDL Ratio  3  Cholesterol 0 - 200 mg/dL 409  HDL Cholesterol >81.19 mg/dL 14.78  LDL (calc) 0 - 99 mg/dL 66  NonHDL  29.56  Triglycerides 0.0 - 149.0 mg/dL 213.0  VLDL 0.0 - 86.5 mg/dL 78.4    Latest Reference  Range & Units 03/06/22 09:36  Sodium 134 - 144 mmol/L 141  Potassium 3.5 - 5.2 mmol/L 4.5  Chloride 96 - 106 mmol/L 101  CO2 20 - 29 mmol/L 22  Glucose 70 - 99 mg/dL 696 (H)  BUN 8 - 27 mg/dL 8  Creatinine 2.95 - 2.84 mg/dL 1.32  Calcium 8.7 - 44.0 mg/dL 10.2  BUN/Creatinine Ratio 12 - 28  10 (L)  eGFR >59 mL/min/1.73 86  Alkaline Phosphatase 44 - 121 IU/L 69  Albumin 3.9 - 4.9 g/dL 4.5  Albumin/Globulin Ratio 1.2 - 2.2  1.6  AST 0 - 40 IU/L 23  ALT 0 - 32 IU/L 28  Total Protein 6.0 - 8.5 g/dL 7.3  Total Bilirubin 0.0 - 1.2 mg/dL 0.4    Latest Reference Range & Units Most Recent  TSH 0.450 - 4.500 uIU/mL 0.671 05/29/22 14:24     THYROID ULTRASOUND 09/18/2022  Estimated total number of nodules >/= 1 cm:   Number of spongiform nodules >/=  2 cm not described below (TR1): 0   Number of mixed cystic and solid nodules >/= 1.5 cm not described below (TR2): 0   _________________________________________________________   Nodule labeled 1 is a previously described spongiform TR 1 nodule in the superior right thyroid lobe measuring up to 1.5 cm, previously 1.4 cm. It remains unchanged. This nodule does NOT meet TI-RADS criteria for biopsy or dedicated follow-up.   Nodule labeled 2 is a small subcentimeter spongiform TR 1 nodule in the inferior right thyroid lobe. This nodule does NOT meet TI-RADS criteria for biopsy or dedicated follow-up.   Nodule labeled 3 in the mid left thyroid lobe measures up to 1.3 cm and is a solid hypoechoic TR 4 nodule (independently measured on image 41). This nodule (previously labeled 6) had measured 1.4 cm on most recent comparison, and 1.4 cm in April 2021. It remains similar in size and morphology. *Given size (>/= 1 - 1.4 cm) and appearance, a follow-up ultrasound in 1 year should be considered based on TI-RADS criteria.   Nodule labeled 4 in the inferior left thyroid lobe was previously biopsied, measuring up to 1.8 cm on today's exam,  previously reported 1.9 cm. This nodule was independently measured on image 49. There appears to remain similar in size and morphology.   IMPRESSION: 1. Enlarged multinodular thyroid gland. No new or enlarging thyroid nodules. 2. Similar appearance of previously biopsied nodule labeled 4 (previously 7) in the inferior left thyroid lobe. Correlate with biopsy results. 3. Similar appearance of nodule labeled 3 (previously 6; 1.3 cm TR 4) in the mid left thyroid lobe. This nodule continues to meet criteria for follow-up ultrasound in 1 year. This exam marks 3 year stability. A total follow-up interval of 5 years is recommended.   FNA left inferior thyroid nodule 10/14/2019   Clinical History: None provided  Specimen Submitted:  A. THYROID,LEFT INFERIOR, FINE NEEDLE ASPIRATION:  FINAL MICROSCOPIC DIAGNOSIS:  - Consistent with benign follicular nodule ([place] category II)   ASSESSMENT / PLAN / RECOMMENDATIONS:   1) Left neck muscle strain:  -This is more consistent with muscle strain -Her CT scan of the neck and thyroid ultrasound did not reveal any concerning acute issues -Patient advised to use heating pad and Tylenol 2-3 times daily for the next 3 days  2) Type 2 Diabetes Mellitus, Optimally  controlled, With neuropathic  complications - Most recent A1c of 5.5%. Goal A1c < 7.0 %.    -Her A1c remains at goal -The pharmacy is out of Mounjaro 7.5 mg, she was given multiple 2.5 mg pens -Patient is asking if she could take all 3 together, which is okay   MEDICATIONS: Continue Mounjaro 7.5 mg weekly  EDUCATION / INSTRUCTIONS: BG monitoring instructions: Patient is instructed to check her blood sugars 2-3 times a week. Call [place] [facility] if: BG persistently < 70  I reviewed the Rule of 15 for the treatment of hypoglycemia in detail with the patient. Literature supplied.   3) Diabetic complications:  Eye: Does not have known diabetic retinopathy.  Neuro/  Feet: Does have known diabetic peripheral neuropathy. Renal: Patient does not have known baseline CKD. She is  on an ACEI/ARB at present.  4) MULTINODULAR GOITER:  -Her pain today is not related to her thyroid as it is more posterior towards the trapezius muscle -She is s/p benign FNA of the left thyroid nodule in 2021 -Repeat thyroid ultrasound 08/2022 shows stability -TSH was normal 05/2022  Follow-up in 6 months   Signed electronically by: Stacey Herrlich, [name]  [facility]  [facility] [address] [place], [place] [address] Phone: [phone] FAX: [phone]   CC: Stacey Levins, [name] [address] [place] [address] Phone: [phone]  Fax: [phone]    Return to Endocrinology clinic as below: Future Appointments  Date Time Provider Department Center  10/11/2022 10:30 AM [name], Stacey Dolores, [name] [facility] None  10/17/2022 11:30 AM Stacey Cork, [name] [facility]  12/04/2022  9:40 AM Stacey Levins, [name] [facility] None  01/06/2023  8:40 AM [name], [name] [facility]  03/05/2023  8:30 AM [name], Stacey Dolores, [name] [facility] None

## 2022-10-17 ENCOUNTER — Ambulatory Visit: Payer: 59 | Admitting: Sports Medicine

## 2022-12-04 ENCOUNTER — Ambulatory Visit: Payer: 59 | Admitting: Internal Medicine

## 2022-12-04 VITALS — BP 110/66 | Temp 97.6°F | Ht 65.0 in | Wt 188.2 lb

## 2022-12-04 DIAGNOSIS — E538 Deficiency of other specified B group vitamins: Secondary | ICD-10-CM

## 2022-12-04 DIAGNOSIS — Z0001 Encounter for general adult medical examination with abnormal findings: Secondary | ICD-10-CM

## 2022-12-04 DIAGNOSIS — I10 Essential (primary) hypertension: Secondary | ICD-10-CM | POA: Diagnosis not present

## 2022-12-04 DIAGNOSIS — E099 Drug or chemical induced diabetes mellitus without complications: Secondary | ICD-10-CM | POA: Diagnosis not present

## 2022-12-04 DIAGNOSIS — E559 Vitamin D deficiency, unspecified: Secondary | ICD-10-CM | POA: Diagnosis not present

## 2022-12-04 DIAGNOSIS — T380X5S Adverse effect of glucocorticoids and synthetic analogues, sequela: Secondary | ICD-10-CM

## 2022-12-04 DIAGNOSIS — E785 Hyperlipidemia, unspecified: Secondary | ICD-10-CM | POA: Diagnosis not present

## 2022-12-04 LAB — LIPID PANEL
Cholesterol: 125 mg/dL (ref 0–200)
HDL: 52.9 mg/dL (ref >39.00)
LDL Cholesterol: 46 mg/dL (ref 0–99)
NonHDL: 71.93
Total CHOL/HDL Ratio: 2
Triglycerides: 129 mg/dL (ref 0.0–149.0)
VLDL: 25.8 mg/dL (ref 0.0–40.0)

## 2022-12-04 LAB — CBC WITH DIFFERENTIAL/PLATELET
Basophils Absolute: 0.1 10*3/uL (ref 0.0–0.1)
Basophils Relative: 1.1 % (ref 0.0–3.0)
Eosinophils Absolute: 0.2 10*3/uL (ref 0.0–0.7)
Eosinophils Relative: 3 % (ref 0.0–5.0)
HCT: 43 % (ref 36.0–46.0)
Hemoglobin: 14 g/dL (ref 12.0–15.0)
Lymphocytes Relative: 45.3 % (ref 12.0–46.0)
Lymphs Abs: 2.8 10*3/uL (ref 0.7–4.0)
MCHC: 32.4 g/dL (ref 30.0–36.0)
MCV: 88.6 fl (ref 78.0–100.0)
Monocytes Absolute: 0.4 10*3/uL (ref 0.1–1.0)
Monocytes Relative: 6.6 % (ref 3.0–12.0)
Neutro Abs: 2.7 10*3/uL (ref 1.4–7.7)
Neutrophils Relative %: 44 % (ref 43.0–77.0)
Platelets: 172 10*3/uL (ref 150.0–400.0)
RBC: 4.86 Mil/uL (ref 3.87–5.11)
RDW: 13.4 % (ref 11.5–15.5)
WBC: 6.1 10*3/uL (ref 4.0–10.5)

## 2022-12-04 LAB — URINALYSIS, ROUTINE W REFLEX MICROSCOPIC
Bilirubin Urine: NEGATIVE
Hgb urine dipstick: NEGATIVE
Ketones, ur: NEGATIVE
Leukocytes,Ua: NEGATIVE
Nitrite: NEGATIVE
RBC / HPF: NONE SEEN (ref 0–?)
Specific Gravity, Urine: 1.02 (ref 1.000–1.030)
Total Protein, Urine: NEGATIVE
Urine Glucose: NEGATIVE
Urobilinogen, UA: 0.2 (ref 0.0–1.0)
pH: 7 (ref 5.0–8.0)

## 2022-12-04 LAB — VITAMIN D 25 HYDROXY (VIT D DEFICIENCY, FRACTURES): VITD: 34.84 ng/mL (ref 30.00–100.00)

## 2022-12-04 LAB — BASIC METABOLIC PANEL
BUN: 15 mg/dL (ref 6–23)
CO2: 26 mEq/L (ref 19–32)
Calcium: 9.8 mg/dL (ref 8.4–10.5)
Chloride: 105 mEq/L (ref 96–112)
Creatinine, Ser: 0.76 mg/dL (ref 0.40–1.20)
GFR: 83.53 mL/min (ref >60.00)
Glucose, Bld: 85 mg/dL (ref 70–99)
Potassium: 4.1 mEq/L (ref 3.5–5.1)
Sodium: 138 mEq/L (ref 135–145)

## 2022-12-04 LAB — HEPATIC FUNCTION PANEL
ALT: 14 U/L (ref 0–35)
AST: 21 U/L (ref 0–37)
Albumin: 4.5 g/dL (ref 3.5–5.2)
Alkaline Phosphatase: 48 U/L (ref 39–117)
Bilirubin, Direct: 0.1 mg/dL (ref 0.0–0.3)
Total Bilirubin: 0.4 mg/dL (ref 0.2–1.2)
Total Protein: 7.9 g/dL (ref 6.0–8.3)

## 2022-12-04 LAB — VITAMIN B12: Vitamin B-12: 462 pg/mL (ref 211–911)

## 2022-12-04 LAB — HEMOGLOBIN A1C: Hgb A1c MFr Bld: 5.2 % (ref 4.6–6.5)

## 2022-12-04 LAB — TSH: TSH: 1.01 u[IU]/mL (ref 0.35–5.50)

## 2022-12-04 NOTE — Patient Instructions (Addendum)
Ok to increase the mounjaro to 10 mg weekly  Please continue all other medications as before, and refills have been done if requested.  Please have the pharmacy call with any other refills you may need.  Please continue your efforts at being more active, low cholesterol diet, and weight control.  You are otherwise up to date with prevention measures today.  Please keep your appointments with your specialists as you may have planned  Please go to the LAB at the blood drawing area for the tests to be done  You will be contacted by phone if any changes need to be made immediately.  Otherwise, you will receive a letter about your results with an explanation, but please check with MyChart first.  Please make an Appointment to return in 6 months, or sooner if needed, also with Lab Appointment for testing done 3-5 days before at the FIRST FLOOR Lab (so this is for TWO appointments - please see the scheduling desk as you leave)

## 2022-12-04 NOTE — Progress Notes (Signed)
The test results show that your current treatment is OK, as the tests are stable.  Please continue the same plan.  There is no other need for change of treatment or further evaluation based on these results, at this time.  thanks 

## 2022-12-04 NOTE — Progress Notes (Signed)
Patient ID: [name], female   DOB: 1960/07/10, 62 y.o.   MRN: [id number]         Chief Complaint:: wellness exam and obesity, dm, hld, htn, low vit d, low b12 hld       HPI:  [name] is a 62 y.o. female here for wellness exam; up to date               Also to see pain management tomorrow,  Pt denies chest pain, increased sob or doe, wheezing, orthopnea, PND, increased LE swelling, palpitations, dizziness or syncope.   Pt denies polydipsia, polyuria, or new focal neuro s/s.    Pt denies fever, wt loss, night sweats, loss of appetite, or other constitutional symptoms  Tolerating mounjaro.     Wt Readings from Last 3 Encounters:  12/04/22 188 lb 4 oz (85.4 kg)  10/11/22 194 lb 3.2 oz (88.1 kg)  08/28/22 196 lb (88.9 kg)   BP Readings from Last 3 Encounters:  12/04/22 110/66  10/11/22 114/72  08/28/22 122/62   Immunization History  Administered Date(s) Administered   Influenza Split 03/28/2011, 04/02/2012   Influenza Whole 04/04/2010   Influenza,inj,Quad PF,6+ Mos 03/19/2013, 04/01/2014, 04/01/2014, 03/24/2015, 03/05/2017, 04/09/2018, 03/31/2019, 03/10/2020, 03/08/2021, 03/01/2022   Influenza-Unspecified 03/18/2016   Moderna Sars-Covid-2 Vaccination 09/02/2019, 10/05/2019   Pneumococcal Conjugate-13 05/25/2019   Pneumococcal Polysaccharide-23 05/31/2020   Td 01/16/2009   Tdap 03/05/2017   Zoster Recombinat (Shingrix) 05/31/2020, 08/10/2020  There are no preventive care reminders to display for this patient.    Past Medical History:  Diagnosis Date   Anemia    ANEMIA-IRON DEFICIENCY 01/03/2007   Anxiety    ANXIETY 01/03/2007   Back pain    Complication of anesthesia    severe post-operative nausea except for bladder surgery in 2002 at [facility]-did not have complications then   Depression    DEPRESSION 01/03/2007   Diabetes (HCC)    DJD (degenerative joint disease)    knee   Edema of both lower extremities    Enlarged thyroid 10/18/2019   Family history  of adverse reaction to anesthesia    mother and father both had severe post-operative nausea and vomiting   Fatty liver    Foot pain    GERD 01/16/2009   GERD (gastroesophageal reflux disease)    Headache(784.0) 01/03/2007   Hyperplastic colon polyp    04/2010   HYPERSOMNIA 04/04/2010   Hypertension    IBS 01/16/2009   IBS (irritable bowel syndrome)    Impaired glucose tolerance 05/18/2011   Knee pain    Menopause    early   Migraine    Obesity    OSTEOARTHRITIS, KNEES, 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 She has never used smokeless tobacco. She reports that she does not drink alcohol and does not use drugs. family history includes Diabetes in her brother; Hypertension in her mother. Allergies  Allergen Reactions   Meperidine Shortness Of Breath   Morphine Anaphylaxis   Penicillins Hives, Nausea Only, Swelling, Other (See Comments), Anaphylaxis and Nausea And Vomiting    PATIENT HAS HAD A PCN REACTION WITH  IMMEDIATE RASH, FACIAL/TONGUE/THROAT SWELLING, SOB, OR LIGHTHEADEDNESS WITH HYPOTENSION:  #  #  YES  #  #  HAS PT DEVELOPED SEVERE RASH INVOLVING MUCUS MEMBRANES or SKIN NECROSIS: #  #  YES  #  #  PATIENT HAS HAD A PCN REACTION THAT REQUIRED HOSPITALIZATION:  #  #  YES  #  #  Has patient had a PCN reaction occurring within the last 10 years: No  vomiting PATIENT HAS HAD A PCN REACTION WITH IMMEDIATE RASH, FACIAL/TONGUE/THROAT SWELLING, SOB, OR LIGHTHEADEDNESS WITH HYPOTENSION:  #  #  YES  #  #  HAS PT DEVELOPED SEVERE RASH INVOLVING MUCUS MEMBRANES or SKIN NECROSIS: #  #  YES  #  #  PATIENT HAS HAD A PCN REACTION THAT REQUIRED HOSPITALIZATION:  #  #  YES  #  #  Has patient had a  PCN reaction occurring within the last 10 years: No   Azithromycin Nausea And Vomiting and Nausea Only   Lisinopril Cough        Sucralfate Rash and Other (See Comments)    Blurry vision  Blurry vision     Doxycycline Nausea Only    nausea   Gabapentin Other (See Comments)    Muscle pain, fatigue   Other    Tyloxapol Other (See Comments)   Amoxicillin Nausea Only   Codeine Nausea Only and Other (See Comments)    Hot flashes also   Hydrocodone Itching, Nausea Only and Rash   Omeprazole Itching and Rash   Oxycodone Nausea Only, Rash and Nausea And Vomiting   Oxycodone-Acetaminophen Nausea And Vomiting and Rash   Tramadol Itching, Nausea Only and Rash   Current Outpatient Medications on File Prior to Visit  Medication Sig Dispense Refill   acetaminophen (TYLENOL) 500 MG tablet Take 1,000 mg by mouth daily as needed for moderate pain or headache.     aspirin EC 81 MG tablet Take 81 mg by mouth daily. Swallow whole.     Cholecalciferol (THERA-D 2000) 50 MCG (2000 UT) TABS 1 tab by mouth once daily 30 tablet 99   esomeprazole (NEXIUM) 20 MG capsule Take 20 mg by mouth daily.     estradiol (ESTRACE) 0.5 MG tablet Take 0.5 mg by mouth daily.     gabapentin (NEURONTIN) 100 MG capsule Take 2 capsules (200 mg total) by mouth 3 (three) times daily as needed. 180 capsule 5   glucose blood (ONETOUCH VERIO) test strip Check blood sugar 1 time daily 100 each 3   ketoconazole (NIZORAL) 2 % cream      losartan (COZAAR) 100 MG tablet TAKE 1 TABLET BY MOUTH EVERY DAY 30 tablet 4   Probiotic CAPS Take 1 capsule by mouth daily.     promethazine (PHENERGAN) 25 MG tablet Take 25 mg by mouth daily as needed for nausea or vomiting.     rosuvastatin (CRESTOR) 10 MG tablet TAKE 1 TABLET BY MOUTH EVERY DAY 30 tablet 11   tirzepatide (MOUNJARO) 7.5 MG/0.5ML Pen Inject 7.5 mg into the skin once a week. 6 mL 3   vitamin B-12 (CYANOCOBALAMIN) 100 MCG tablet Take 100 mcg by mouth daily.     cetirizine (ZYRTEC)  10 MG tablet Take 1 tablet (10 mg total) by mouth daily. 30 tablet 11   hydrocortisone 2.5 % cream  (Patient not taking: Reported on 12/04/2022)     No current facility-administered medications on file prior to visit.        ROS:  All others reviewed and  negative.  Objective        PE:  BP 110/66   Temp 97.6 F (36.4 C) (Temporal)   Ht 5\' 5"  (1.651 m)   Wt 188 lb 4 oz (85.4 kg)   BMI 31.33 kg/m                 Constitutional: Pt appears in NAD               HENT: Head: NCAT.                Right Ear: External ear normal.                 Left Ear: External ear normal.                Eyes: . Pupils are equal, round, and reactive to light. Conjunctivae and EOM are normal               Nose: without d/c or deformity               Neck: Neck supple. Gross normal ROM               Cardiovascular: Normal rate and regular rhythm.                 Pulmonary/Chest: Effort normal and breath sounds without rales or wheezing.                Abd:  Soft, NT, ND, + BS, no organomegaly               Neurological: Pt is alert. At baseline orientation, motor grossly intact               Skin: Skin is warm. No rashes, no other new lesions, LE edema - none               Psychiatric: Pt behavior is normal without agitation   Micro: none  Cardiac tracings I have personally interpreted today:  none  Pertinent Radiological findings (summarize): none   Lab Results  Component Value Date   WBC 6.1 12/04/2022   HGB 14.0 12/04/2022   HCT 43.0 12/04/2022   PLT 172.0 12/04/2022   GLUCOSE 85 12/04/2022   CHOL 125 12/04/2022   TRIG 129.0 12/04/2022   HDL 52.90 12/04/2022   LDLDIRECT 124.0 05/26/2020   LDLCALC 46 12/04/2022   ALT 14 12/04/2022   AST 21 12/04/2022   NA 138 12/04/2022   K 4.1 12/04/2022   CL 105 12/04/2022   CREATININE 0.76 12/04/2022   BUN 15 12/04/2022   CO2 26 12/04/2022   TSH 1.01 12/04/2022   HGBA1C 5.2 12/04/2022   MICROALBUR 1.7 08/28/2022   Assessment/Plan:  HANIN DEIDA is a 62 y.o. White or Caucasian [1] female with  has a past medical history of Anemia, ANEMIA-IRON DEFICIENCY (01/03/2007), Anxiety, ANXIETY (01/03/2007), Back pain, Complication of anesthesia, Depression, DEPRESSION (01/03/2007), Diabetes (HCC), DJD (degenerative joint disease), Edema of both lower extremities, Enlarged thyroid (10/18/2019), Family history of adverse reaction to anesthesia, Fatty liver, Foot pain, GERD (01/16/2009), GERD (gastroesophageal reflux disease), Headache(784.0) (01/03/2007), Hyperplastic colon polyp, HYPERSOMNIA (04/04/2010), Hypertension, IBS (01/16/2009), IBS (irritable bowel syndrome), Impaired glucose tolerance (05/18/2011), Knee pain, Menopause, Migraine, Obesity, OSTEOARTHRITIS, KNEES, 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  Encounter for well adult exam with abnormal findings Age and sex appropriate education and counseling updated with regular exercise and diet Referrals for  preventative services - none needed Immunizations addressed - none needed Smoking counseling  - none needed Evidence for depression or other mood disorder - none significant Most recent labs reviewed. I have personally reviewed and have noted: 1) the patient's medical and social history 2) The patient's current medications and supplements 3) The patient's height, weight, and BMI have been recorded in the chart   Hypertension, uncontrolled BP Readings from Last 3 Encounters:  12/04/22 110/66  10/11/22 114/72  08/28/22 122/62   Stable, pt to continue medical treatment losartan 100 qd  Dyslipidemia Lab Results  Component Value Date   LDLCALC 46 12/04/2022   Stable, pt to continue current statin crestor 10 qd   Vitamin D deficiency Last vitamin D Lab Results  Component Value Date   VD25OH 34.84 12/04/2022   Low, to start oral replacement   Steroid-induced diabetes (HCC) Lab Results   Component Value Date   HGBA1C 5.2 12/04/2022   Uncontrolled obesity, pt to increased mounjaro 10 q week   B12 deficiency Lab Results  Component Value Date   VITAMINB12 462 12/04/2022   Stable, cont oral replacement - b12 1000 mcg qd  Followup: Return in about 6 months (around 06/05/2023).  Oliver Barre, MD 12/06/2022 8:09 PM [facility]

## 2022-12-05 ENCOUNTER — Ambulatory Visit (INDEPENDENT_AMBULATORY_CARE_PROVIDER_SITE_OTHER): Payer: 59 | Admitting: Internal Medicine

## 2022-12-05 DIAGNOSIS — R739 Hyperglycemia, unspecified: Secondary | ICD-10-CM | POA: Insufficient documentation

## 2022-12-06 ENCOUNTER — Encounter: Payer: Self-pay | Admitting: Internal Medicine

## 2022-12-06 NOTE — Assessment & Plan Note (Signed)

## 2022-12-06 NOTE — Assessment & Plan Note (Signed)
Last vitamin D Lab Results  Component Value Date   VD25OH 34.84 12/04/2022   Low, to start oral replacement

## 2022-12-06 NOTE — Assessment & Plan Note (Signed)
Lab Results  Component Value Date   VITAMINB12 462 12/04/2022   Stable, cont oral replacement - b12 1000 mcg qd

## 2022-12-06 NOTE — Assessment & Plan Note (Signed)
BP Readings from Last 3 Encounters:  12/04/22 110/66  10/11/22 114/72  08/28/22 122/62   Stable, pt to continue medical treatment losartan 100 qd

## 2022-12-06 NOTE — Assessment & Plan Note (Signed)
Lab Results  Component Value Date   HGBA1C 5.2 12/04/2022   Uncontrolled obesity, pt to increased mounjaro 10 q week

## 2022-12-06 NOTE — Assessment & Plan Note (Signed)
Lab Results  Component Value Date   LDLCALC 46 12/04/2022   Stable, pt to continue current statin crestor 10 qd

## 2022-12-31 ENCOUNTER — Telehealth: Payer: Self-pay | Admitting: Internal Medicine

## 2022-12-31 NOTE — Telephone Encounter (Signed)
Patient said she spoke with Dr. Jonny Ruiz and they had spoken about increasing her Mounjaro to 10 mg. She wanted to know if he was still going to do that. She also spoke with her pharmacy and they told her the only location that has 10 mg Greggory Keen is [facility] [address] in [place]. Best callback is [phone].

## 2023-01-01 ENCOUNTER — Ambulatory Visit (INDEPENDENT_AMBULATORY_CARE_PROVIDER_SITE_OTHER): Payer: 59 | Admitting: Internal Medicine

## 2023-01-01 ENCOUNTER — Encounter (INDEPENDENT_AMBULATORY_CARE_PROVIDER_SITE_OTHER): Payer: Self-pay | Admitting: Internal Medicine

## 2023-01-01 VITALS — BP 103/68 | HR 76 | Temp 98.1°F | Ht 65.0 in | Wt 183.0 lb

## 2023-01-01 DIAGNOSIS — Z7985 Long-term (current) use of injectable non-insulin antidiabetic drugs: Secondary | ICD-10-CM

## 2023-01-01 DIAGNOSIS — Z6830 Body mass index (BMI) 30.0-30.9, adult: Secondary | ICD-10-CM

## 2023-01-01 DIAGNOSIS — E1169 Type 2 diabetes mellitus with other specified complication: Secondary | ICD-10-CM

## 2023-01-01 DIAGNOSIS — E669 Obesity, unspecified: Secondary | ICD-10-CM | POA: Diagnosis not present

## 2023-01-01 MED ORDER — TIRZEPATIDE 7.5 MG/0.5ML ~~LOC~~ SOAJ: 7.5000 mg | SUBCUTANEOUS | 3 refills | Status: DC

## 2023-01-01 NOTE — Assessment & Plan Note (Signed)
[name] has lost 47 pounds or 21% of total body weight we are seeing improvements in body fat percentage and visceral fat rating but she has also lost about 20% in muscle mass which is a little bit more than expected.  She is not getting the recommended amount of protein and has been consuming some less nutritious foods.  We discussed the importance of increasing consumption of fibrous foods, lean sources of protein with a goal of 30 to 40 g of protein per meal.  We also reviewed the benefits of strengthening to preserve muscle mass.

## 2023-01-01 NOTE — Telephone Encounter (Signed)
Patient's weight loss doctor wants her to stay on the 7.5 mg. For now.

## 2023-01-01 NOTE — Assessment & Plan Note (Signed)
Well-controlled most recent A1c is 5.2.  On Mounjaro 7.5 mg without any adverse effects.  She denies any symptoms of hypoglycemia.  She also continues to lose weight Lab Results  Component Value Date   HGBA1C 5.2 12/04/2022   HGBA1C 5.5 08/28/2022   HGBA1C 6.8 (H) 05/29/2022   Lab Results  Component Value Date   MICROALBUR 1.7 08/28/2022   LDLCALC 46 12/04/2022   CREATININE 0.76 12/04/2022   Recommend that she stay at this dose.  Increasing her Greggory Keen may result in restrictive eating pattern which may affect muscle wasting.  She will discuss this with her primary care physician.

## 2023-01-01 NOTE — Progress Notes (Signed)
Office: [phone]  /  Fax: [phone]  WEIGHT SUMMARY AND BIOMETRICS  Vitals Temp: 98.1 F (36.7 C) BP: 103/68 Pulse Rate: 76 SpO2: 99 %   Anthropometric Measurements Height: 5\' 5"  (1.651 m) Weight: 183 lb (83 kg) BMI (Calculated): 30.45 Weight at Last Visit: 196 lb Starting Weight: 222 lb Total Weight Loss (lbs): 39 lb (17.7 kg)   Body Composition  Body Fat %: 42.7 % Fat Mass (lbs): 78.2 lbs Muscle Mass (lbs): 99.4 lbs Total Body Water (lbs): 73.8 lbs Visceral Fat Rating : 11    No data recorded Today's Visit #: 5  Starting Date: 04/24/22   HPI  Chief Complaint: OBESITY  [name] is here to discuss her progress with her obesity treatment plan. She is on the the Category 1 Plan and states she is following her eating plan approximately 50 % of the time. She states she is doing yardwork for 2 hours 7 times a week.  Interval History:  Since last office visit she has lost 13 lbs. She has not been keeping appointment cadence due to illness. Is on tirzepatide 7.5 mg a week prescribed by primary care team. Medication is scheduled to be increased but BIA show a decrease in muscle mass. Denies SE to medication. She acknowledges eating sugary snacks and not consuming prescribed amount of protein which is about 30-40 grams per meal.   Orixegenic Control: Denies problems with appetite and hunger signals.  Denies problems with satiety and satiation.  Denies problems with eating patterns and portion control.  Reports abnormal cravings. Denies feeling deprived or restricted.   Barriers identified: having difficulty preparing healthy meals and having difficulty focusing on healthy eating.   Pharmacotherapy for weight loss: She is currently taking Monjauro with diabetes as the primary indication with adequate clinical response  and without side effects..    ASSESSMENT AND PLAN  TREATMENT PLAN FOR OBESITY:  Recommended Dietary Goals  [name] is currently in the  action stage of change. As such, her goal is to continue weight management plan. She has agreed to: continue current plan  Behavioral Intervention  We discussed the following Behavioral Modification Strategies today: increasing lean protein intake, decreasing simple carbohydrates , increasing vegetables, increasing lower glycemic fruits, increasing fiber rich foods, avoiding skipping meals, increasing water intake, work on meal planning and preparation, and planning for success.  Additional resources provided today: None  Recommended Physical Activity Goals  Gaelle has been advised to work up to 150 minutes of moderate intensity aerobic activity a week and strengthening exercises 2-3 times per week for cardiovascular health, weight loss maintenance and preservation of muscle mass.   She has agreed to :  Continue current level of physical activity  but consider engaging in strengthening exercises to preserve muscle mass and prevent wasting.  Pharmacotherapy We discussed various medication options to help Manda with her weight loss efforts and we both agreed to : continue current anti-obesity medication regimen, I do not recommend increasing tirzepatide as this may result in restrictive eating and further loss of muscle mass.  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  On Mounjaro 7.5 mg without any adverse effects.  She denies any symptoms of hypoglycemia.  She also continues to lose weight Lab Results  Component Value Date   HGBA1C 5.2 12/04/2022   HGBA1C 5.5 08/28/2022   HGBA1C 6.8 (H) 05/29/2022   Lab  Results  Component Value Date   MICROALBUR 1.7 08/28/2022   LDLCALC 46 12/04/2022   CREATININE 0.76 12/04/2022   Recommend that she stay at this dose.  Increasing her Greggory Keen may result in restrictive eating pattern  which may affect muscle wasting.  She will discuss this with her primary care physician.   Obesity,current BMI 32 Assessment & Plan: [name] has lost 47 pounds or 21% of total body weight we are seeing improvements in body fat percentage and visceral fat rating but she has also lost about 20% in muscle mass which is a little bit more than expected.  She is not getting the recommended amount of protein and has been consuming some less nutritious foods.  We discussed the importance of increasing consumption of fibrous foods, lean sources of protein with a goal of 30 to 40 g of protein per meal.  We also reviewed the benefits of strengthening to preserve muscle mass.     PHYSICAL EXAM:  Blood pressure 103/68, pulse 76, temperature 98.1 F (36.7 C), height 5\' 5"  (1.651 m), weight 183 lb (83 kg), SpO2 99 %. Body mass index is 30.45 kg/m.  General: She is overweight, cooperative, alert, well developed, and in no acute distress. PSYCH: Has normal mood, affect and thought process.   HEENT: EOMI, sclerae are anicteric. Lungs: Normal breathing effort, no conversational dyspnea. Extremities: No edema.  Neurologic: No gross sensory or motor deficits. No tremors or fasciculations noted.    DIAGNOSTIC DATA REVIEWED:  BMET    Component Value Date/Time   NA 138 12/04/2022 1056   NA 141 03/06/2022 0936   K 4.1 12/04/2022 1056   CL 105 12/04/2022 1056   CO2 26 12/04/2022 1056   GLUCOSE 85 12/04/2022 1056   BUN 15 12/04/2022 1056   BUN 8 03/06/2022 0936   CREATININE 0.76 12/04/2022 1056   CREATININE 0.73 04/10/2015 1659   CALCIUM 9.8 12/04/2022 1056   GFRNONAA >60 08/10/2021 1144   GFRAA 103 09/22/2019 1017   Lab Results  Component Value Date   HGBA1C 5.2 12/04/2022   HGBA1C 5.9 01/07/2007   Lab Results  Component Value Date   INSULIN 30.1 (H) 05/30/2022   Lab Results  Component Value Date   TSH 1.01 12/04/2022   CBC    Component Value Date/Time   WBC 6.1 12/04/2022 1056   RBC  4.86 12/04/2022 1056   HGB 14.0 12/04/2022 1056   HGB 13.6 03/06/2022 0936   HCT 43.0 12/04/2022 1056   HCT 40.3 03/06/2022 0936   PLT 172.0 12/04/2022 1056   PLT 212 03/06/2022 0936   MCV 88.6 12/04/2022 1056   MCV 88 03/06/2022 0936   MCH 29.7 03/06/2022 0936   MCH 28.6 08/10/2021 1144   MCHC 32.4 12/04/2022 1056   RDW 13.4 12/04/2022 1056   RDW 12.9 03/06/2022 0936   Iron Studies    Component Value Date/Time   IRON 68 05/28/2021 0917   TIBC 492.8 (H) 05/28/2021 0917   FERRITIN 57.6 05/28/2021 0917   IRONPCTSAT 13.8 (L) 05/28/2021 0917   Lipid Panel     Component Value Date/Time   CHOL 125 12/04/2022 1056   CHOL 117 05/29/2022 1424   TRIG 129.0 12/04/2022 1056   HDL 52.90 12/04/2022 1056   HDL 51 05/29/2022 1424   CHOLHDL 2 12/04/2022 1056   VLDL 25.8 12/04/2022 1056   LDLCALC 46 12/04/2022 1056   LDLCALC 49 05/29/2022 1424   LDLDIRECT 124.0 05/26/2020 1009   Hepatic Function Panel  Component Value Date/Time   PROT 7.9 12/04/2022 1056   PROT 7.3 03/06/2022 0936   ALBUMIN 4.5 12/04/2022 1056   ALBUMIN 4.5 03/06/2022 0936   AST 21 12/04/2022 1056   ALT 14 12/04/2022 1056   ALKPHOS 48 12/04/2022 1056   BILITOT 0.4 12/04/2022 1056   BILITOT 0.4 03/06/2022 0936   BILIDIR 0.1 12/04/2022 1056      Component Value Date/Time   TSH 1.01 12/04/2022 1056   Nutritional Lab Results  Component Value Date   VD25OH 34.84 12/04/2022   VD25OH 43.6 05/29/2022   VD25OH 35.68 12/06/2021     Return in about 3 weeks (around 01/22/2023) for For Weight Mangement with Dr. Rikki Spearing.Marland Kitchen She was informed of the importance of frequent follow up visits to maximize her success with intensive lifestyle modifications for her multiple health conditions.   ATTESTASTION STATEMENTS:  Reviewed by clinician on day of visit: allergies, medications, problem list, medical history, surgical history, family history, social history, and previous encounter notes.   I have spent 30 minutes  in the care of the patient today including: preparing to see patient (e.g. review and interpretation of tests, old notes ), counseling and educating the patient, documenting clinical information in the electronic or other health care record, and independently interpreting results and communicating results to the patient,family, or caregiver   Worthy Rancher, MD

## 2023-01-01 NOTE — Telephone Encounter (Signed)
Ok this is done erx 

## 2023-01-06 ENCOUNTER — Ambulatory Visit (HOSPITAL_BASED_OUTPATIENT_CLINIC_OR_DEPARTMENT_OTHER): Payer: 59 | Admitting: Cardiovascular Disease

## 2023-01-07 ENCOUNTER — Other Ambulatory Visit (HOSPITAL_BASED_OUTPATIENT_CLINIC_OR_DEPARTMENT_OTHER): Payer: Self-pay | Admitting: Nurse Practitioner

## 2023-01-23 ENCOUNTER — Ambulatory Visit: Payer: 59 | Admitting: Internal Medicine

## 2023-01-27 ENCOUNTER — Ambulatory Visit: Payer: 59 | Admitting: Internal Medicine

## 2023-01-27 ENCOUNTER — Encounter: Payer: Self-pay | Admitting: Internal Medicine

## 2023-01-27 VITALS — BP 110/78 | HR 83 | Temp 98.4°F | Ht 65.0 in | Wt 185.6 lb

## 2023-01-27 DIAGNOSIS — J309 Allergic rhinitis, unspecified: Secondary | ICD-10-CM | POA: Diagnosis not present

## 2023-01-27 DIAGNOSIS — H6992 Unspecified Eustachian tube disorder, left ear: Secondary | ICD-10-CM | POA: Diagnosis not present

## 2023-01-27 DIAGNOSIS — H6692 Otitis media, unspecified, left ear: Secondary | ICD-10-CM | POA: Diagnosis not present

## 2023-01-27 DIAGNOSIS — E559 Vitamin D deficiency, unspecified: Secondary | ICD-10-CM

## 2023-01-27 DIAGNOSIS — Z7985 Long-term (current) use of injectable non-insulin antidiabetic drugs: Secondary | ICD-10-CM

## 2023-01-27 DIAGNOSIS — E1165 Type 2 diabetes mellitus with hyperglycemia: Secondary | ICD-10-CM | POA: Diagnosis not present

## 2023-01-27 MED ORDER — LEVOFLOXACIN 500 MG PO TABS: 500.0000 mg | ORAL_TABLET | Freq: Every day | ORAL | 0 refills | Status: AC

## 2023-01-27 NOTE — Assessment & Plan Note (Signed)
Lab Results  Component Value Date   HGBA1C 5.2 12/04/2022   Stable, pt to continue current medical treatment mounjaro 7.5 weekly

## 2023-01-27 NOTE — Assessment & Plan Note (Signed)
Mild to mod, for mucinex bid prn,  to f/u any worsening symptoms or concerns 

## 2023-01-27 NOTE — Assessment & Plan Note (Signed)
Mild to mod, for nasacort asd,,  to f/u any worsening symptoms or concerns 

## 2023-01-27 NOTE — Assessment & Plan Note (Signed)
Mild to mod, for antibx course levaquin 500 every day,,  to f/u any worsening symptoms or concerns

## 2023-01-27 NOTE — Patient Instructions (Signed)
Please take all new medication as prescribed - the antibiotic  Please continue all other medications as before, and refills have been done if requested.  Please have the pharmacy call with any other refills you may need.  Please keep your appointments with your specialists as you may have planned   

## 2023-01-27 NOTE — Progress Notes (Signed)
Patient ID: [name], female   DOB: [birth year], 62 y.o.   MRN: [id number]        Chief Complaint: follow up left ear pain, allergies, left eustachian tube dysfunction, low vit d, dm       HPI:  [name] is a 62 y.o. female here with c/o 3 days onset left ear pain, feverish, reduced hearing, popping and crackling, and Does have several wks ongoing nasal allergy symptoms with clearish congestion, itch and sneezing, without fever, pain, ST, cough, swelling or wheezing.  Pt denies chest pain, increased sob or doe, wheezing, orthopnea, PND, increased LE swelling, palpitations, dizziness or syncope.   Pt denies polydipsia, polyuria, or new focal neuro s/s.          Wt Readings from Last 3 Encounters:  01/27/23 185 lb 9.6 oz (84.2 kg)  01/01/23 183 lb (83 kg)  12/04/22 188 lb 4 oz (85.4 kg)   BP Readings from Last 3 Encounters:  01/27/23 110/78  01/01/23 103/68  12/04/22 110/66         Past Medical History:  Diagnosis Date   Anemia    ANEMIA-IRON DEFICIENCY 01/03/2007   Anxiety    ANXIETY 01/03/2007   Back pain    Complication of anesthesia    severe post-operative nausea except for bladder surgery in 2002 at [facility]-did not have complications then   Depression    DEPRESSION 01/03/2007   Diabetes (HCC)    DJD (degenerative joint disease)    knee   Edema of both lower extremities    Enlarged thyroid 10/18/2019   Family history of adverse reaction to anesthesia    mother and father both had severe post-operative nausea and vomiting   Fatty liver    Foot pain    GERD 01/16/2009   GERD (gastroesophageal reflux disease)    Headache(784.0) 01/03/2007   Hyperplastic colon polyp    04/2010   HYPERSOMNIA 04/04/2010   Hypertension    IBS 01/16/2009   IBS (irritable bowel syndrome)    Impaired glucose tolerance 05/18/2011   Knee pain    Menopause    early   Migraine    Obesity    OSTEOARTHRITIS, KNEES, 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 She has never used smokeless tobacco. She reports that she does not drink alcohol and does not use drugs. family history includes Diabetes in her brother; Hypertension in her mother. Allergies  Allergen Reactions   Meperidine Shortness Of Breath   Morphine Anaphylaxis   Penicillins Hives, Nausea Only, Swelling, Other (See Comments), Anaphylaxis and Nausea And Vomiting    PATIENT HAS HAD A PCN REACTION WITH IMMEDIATE RASH, FACIAL/TONGUE/THROAT SWELLING, SOB, OR LIGHTHEADEDNESS WITH HYPOTENSION:  #  #  YES  #  #  HAS PT DEVELOPED SEVERE RASH INVOLVING MUCUS MEMBRANES or SKIN NECROSIS: #  #  YES  #  #  PATIENT HAS HAD A PCN REACTION THAT REQUIRED HOSPITALIZATION:  #  #  YES  #  #  Has patient had a PCN reaction occurring within the last 10 years: No  vomiting PATIENT HAS HAD A PCN  REACTION WITH IMMEDIATE RASH, FACIAL/TONGUE/THROAT SWELLING, SOB, OR LIGHTHEADEDNESS WITH HYPOTENSION:  #  #  YES  #  #  HAS PT DEVELOPED SEVERE RASH INVOLVING MUCUS MEMBRANES or SKIN NECROSIS: #  #  YES  #  #  PATIENT HAS HAD A PCN REACTION THAT REQUIRED HOSPITALIZATION:  #  #  YES  #  #  Has patient had a PCN reaction occurring within the last 10 years: No   Azithromycin Nausea And Vomiting and Nausea Only   Lisinopril Cough        Sucralfate Rash and Other (See Comments)    Blurry vision  Blurry vision     Doxycycline Nausea Only    nausea   Gabapentin Other (See Comments)    Muscle pain, fatigue   Other    Tyloxapol Other (See Comments)   Amoxicillin Nausea Only   Codeine Nausea Only and Other (See Comments)    Hot flashes also   Hydrocodone Itching, Nausea Only and Rash   Omeprazole Itching and Rash   Oxycodone Nausea  Only, Rash and Nausea And Vomiting   Oxycodone-Acetaminophen Nausea And Vomiting and Rash   Tramadol Itching, Nausea Only and Rash   Current Outpatient Medications on File Prior to Visit  Medication Sig Dispense Refill   acetaminophen (TYLENOL) 500 MG tablet Take 1,000 mg by mouth daily as needed for moderate pain or headache.     aspirin EC 81 MG tablet Take 81 mg by mouth daily. Swallow whole.     Cholecalciferol (THERA-D 2000) 50 MCG (2000 UT) TABS 1 tab by mouth once daily 30 tablet 99   esomeprazole (NEXIUM) 20 MG capsule Take 20 mg by mouth daily.     estradiol (ESTRACE) 0.5 MG tablet Take 0.5 mg by mouth daily.     gabapentin (NEURONTIN) 100 MG capsule Take 2 capsules (200 mg total) by mouth 3 (three) times daily as needed. 180 capsule 5   glucose blood (ONETOUCH VERIO) test strip Check blood sugar 1 time daily 100 each 3   hydrocortisone 2.5 % cream      ketoconazole (NIZORAL) 2 % cream      losartan (COZAAR) 100 MG tablet TAKE 1 TABLET BY MOUTH EVERY DAY 30 tablet 0   Probiotic CAPS Take 1 capsule by mouth daily.     promethazine (PHENERGAN) 25 MG tablet Take 25 mg by mouth daily as needed for nausea or vomiting.     rosuvastatin (CRESTOR) 10 MG tablet TAKE 1 TABLET BY MOUTH EVERY DAY 30 tablet 11   tirzepatide (MOUNJARO) 7.5 MG/0.5ML Pen Inject 7.5 mg into the skin once a week. 6 mL 3   vitamin B-12 (CYANOCOBALAMIN) 100 MCG tablet Take 100 mcg by mouth daily.     cetirizine (ZYRTEC) 10 MG tablet Take 1 tablet (10 mg total) by mouth daily. 30 tablet 11   No current facility-administered medications on file prior to visit.        ROS:  All others reviewed and negative.  Objective        PE:  BP 110/78 (BP Location: Left Arm, Patient Position: Sitting, Cuff Size: Normal)   Pulse 83   Temp 98.4 F (36.9 C) (Oral)   Ht 5\' 5"  (1.651 m)   Wt 185 lb 9.6 oz (84.2 kg)   SpO2 97%   BMI 30.89 kg/m                 Constitutional: Pt appears in NAD  HENT: Head: NCAT.                 Right Ear: External ear normal.                 Left Ear: External ear normal. Left tm with severe erythema               Eyes: . Pupils are equal, round, and reactive to light. Conjunctivae and EOM are normal               Nose: without d/c or deformity               Neck: Neck supple. Gross normal ROM               Cardiovascular: Normal rate and regular rhythm.                 Pulmonary/Chest: Effort normal and breath sounds without rales or wheezing.                               Neurological: Pt is alert. At baseline orientation, motor grossly intact               Skin: Skin is warm. No rashes, no other new lesions, LE edema - none               Psychiatric: Pt behavior is normal without agitation   Micro: none  Cardiac tracings I have personally interpreted today:  none  Pertinent Radiological findings (summarize): none   Lab Results  Component Value Date   WBC 6.1 12/04/2022   HGB 14.0 12/04/2022   HCT 43.0 12/04/2022   PLT 172.0 12/04/2022   GLUCOSE 85 12/04/2022   CHOL 125 12/04/2022   TRIG 129.0 12/04/2022   HDL 52.90 12/04/2022   LDLDIRECT 124.0 05/26/2020   LDLCALC 46 12/04/2022   ALT 14 12/04/2022   AST 21 12/04/2022   NA 138 12/04/2022   K 4.1 12/04/2022   CL 105 12/04/2022   CREATININE 0.76 12/04/2022   BUN 15 12/04/2022   CO2 26 12/04/2022   TSH 1.01 12/04/2022   HGBA1C 5.2 12/04/2022   MICROALBUR 1.7 08/28/2022   Assessment/Plan:  [name] is a 62 y.o. White or Caucasian [1] female with  has a past medical history of Anemia, ANEMIA-IRON DEFICIENCY (01/03/2007), Anxiety, ANXIETY (01/03/2007), Back pain, Complication of anesthesia, Depression, DEPRESSION (01/03/2007), Diabetes (HCC), DJD (degenerative joint disease), Edema of both lower extremities, Enlarged thyroid (10/18/2019), Family history of adverse reaction to anesthesia, Fatty liver, Foot pain, GERD (01/16/2009), GERD (gastroesophageal reflux disease), Headache(784.0)  (01/03/2007), Hyperplastic colon polyp, HYPERSOMNIA (04/04/2010), Hypertension, IBS (01/16/2009), IBS (irritable bowel syndrome), Impaired glucose tolerance (05/18/2011), Knee pain, Menopause, Migraine, Obesity, OSTEOARTHRITIS, KNEES, 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  Type 2 diabetes mellitus (HCC) Lab Results  Component Value Date   HGBA1C 5.2 12/04/2022   Stable, pt to continue current medical treatment mounjaro 7.5 weekly   Vitamin D deficiency Last vitamin D Lab Results  Component Value Date   VD25OH 34.84 12/04/2022   Low, to start oral replacement   Allergic rhinitis Mild to mod, for nasacort asd,,  to f/u any worsening symptoms or concerns  Left otitis media Mild to mod, for antibx course levaquin 500 every day,,  to f/u any worsening symptoms or concerns  Acute dysfunction of left eustachian tube Mild to mod,  for mucinex bid prn,  to f/u any worsening symptoms or concerns  Followup: Return if symptoms worsen or fail to improve.  Oliver Barre, MD 01/27/2023 7:14 PM  [facility]

## 2023-01-27 NOTE — Assessment & Plan Note (Signed)
Last vitamin D Lab Results  Component Value Date   VD25OH 34.84 12/04/2022   Low, to start oral replacement

## 2023-01-31 ENCOUNTER — Other Ambulatory Visit (HOSPITAL_BASED_OUTPATIENT_CLINIC_OR_DEPARTMENT_OTHER): Payer: Self-pay | Admitting: Nurse Practitioner

## 2023-02-03 ENCOUNTER — Ambulatory Visit (INDEPENDENT_AMBULATORY_CARE_PROVIDER_SITE_OTHER): Payer: 59 | Admitting: Internal Medicine

## 2023-02-13 ENCOUNTER — Encounter: Payer: Self-pay | Admitting: Internal Medicine

## 2023-02-13 ENCOUNTER — Ambulatory Visit: Payer: 59 | Admitting: Internal Medicine

## 2023-02-13 VITALS — BP 124/76 | HR 75 | Temp 98.6°F | Ht 65.0 in | Wt 187.0 lb

## 2023-02-13 DIAGNOSIS — H9203 Otalgia, bilateral: Secondary | ICD-10-CM | POA: Insufficient documentation

## 2023-02-13 DIAGNOSIS — I10 Essential (primary) hypertension: Secondary | ICD-10-CM

## 2023-02-13 DIAGNOSIS — J069 Acute upper respiratory infection, unspecified: Secondary | ICD-10-CM | POA: Diagnosis not present

## 2023-02-13 DIAGNOSIS — M79645 Pain in left finger(s): Secondary | ICD-10-CM | POA: Diagnosis not present

## 2023-02-13 DIAGNOSIS — R739 Hyperglycemia, unspecified: Secondary | ICD-10-CM

## 2023-02-13 DIAGNOSIS — M1812 Unilateral primary osteoarthritis of first carpometacarpal joint, left hand: Secondary | ICD-10-CM

## 2023-02-13 DIAGNOSIS — E538 Deficiency of other specified B group vitamins: Secondary | ICD-10-CM | POA: Diagnosis not present

## 2023-02-13 MED ORDER — LEVOFLOXACIN 500 MG PO TABS: 500.0000 mg | ORAL_TABLET | Freq: Every day | ORAL | 0 refills | Status: AC

## 2023-02-13 NOTE — Progress Notes (Signed)
Patient ID: [name], female   DOB: 1960/09/12, 62 y.o.   MRN: [id number]        Chief Complaint: follow up bilateral ear pain now right > left, SI joint pain,        HPI:  [name] is a 62 y.o. female here with bilateral ear pain and  Here with 2-3 days acute onset fever, facial pain, pressure, headache, general weakness and malaise, with mild ST and cough, but pt denies chest pain, wheezing, increased sob or doe, orthopnea, PND, increased LE swelling, palpitations, dizziness or syncope.  Also due for early sept SI joint cortisone injection, may need nerve ablation eventually.  Pt denies chest pain, increased sob or doe, wheezing, orthopnea, PND, increased LE swelling, palpitations, dizziness or syncope.   Pt denies polydipsia, polyuria, or new focal neuro s/s.    Pt denies recent wt loss, night sweats, loss of appetite, or other constitutional symptoms  Also has 1 mo worsening mod to occas severe left [facility] pain with DJD.         Wt Readings from Last 3 Encounters:  02/13/23 187 lb (84.8 kg)  01/27/23 185 lb 9.6 oz (84.2 kg)  01/01/23 183 lb (83 kg)   BP Readings from Last 3 Encounters:  02/13/23 124/76  01/27/23 110/78  01/01/23 103/68         Past Medical History:  Diagnosis Date   Anemia    ANEMIA-IRON DEFICIENCY 01/03/2007   Anxiety    ANXIETY 01/03/2007   Back pain    Complication of anesthesia    severe post-operative nausea except for bladder surgery in 2002 at [facility]-did not have complications then   Depression    DEPRESSION 01/03/2007   Diabetes (HCC)    DJD (degenerative joint disease)    knee   Edema of both lower extremities    Enlarged thyroid 10/18/2019   Family history of adverse reaction to anesthesia    mother and father both had severe post-operative nausea and vomiting   Fatty liver    Foot pain    GERD 01/16/2009   GERD (gastroesophageal reflux disease)    Headache(784.0) 01/03/2007   Hyperplastic colon polyp    04/2010   HYPERSOMNIA  04/04/2010   Hypertension    IBS 01/16/2009   IBS (irritable bowel syndrome)    Impaired glucose tolerance 05/18/2011   Knee pain    Menopause    early   Migraine    Obesity    OSTEOARTHRITIS, KNEES, 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 She has never used smokeless tobacco. She reports that she does not drink alcohol and does not use drugs. family history includes Diabetes in her brother; Hypertension in her mother. Allergies  Allergen Reactions   Meperidine Shortness Of Breath   Morphine Anaphylaxis   Penicillins Hives, Nausea Only, Swelling, Other (See Comments), Anaphylaxis and Nausea And Vomiting    PATIENT HAS HAD A PCN REACTION WITH IMMEDIATE RASH, FACIAL/TONGUE/THROAT SWELLING, SOB, OR LIGHTHEADEDNESS WITH HYPOTENSION:  #  #  YES  #  #  HAS PT DEVELOPED SEVERE RASH INVOLVING MUCUS MEMBRANES or SKIN  NECROSIS: #  #  YES  #  #  PATIENT HAS HAD A PCN REACTION THAT REQUIRED HOSPITALIZATION:  #  #  YES  #  #  Has patient had a PCN reaction occurring within the last 10 years: No  vomiting PATIENT HAS HAD A PCN REACTION WITH IMMEDIATE RASH, FACIAL/TONGUE/THROAT SWELLING, SOB, OR LIGHTHEADEDNESS WITH HYPOTENSION:  #  #  YES  #  #  HAS PT DEVELOPED SEVERE RASH INVOLVING MUCUS MEMBRANES or SKIN NECROSIS: #  #  YES  #  #  PATIENT HAS HAD A PCN REACTION THAT REQUIRED HOSPITALIZATION:  #  #  YES  #  #  Has patient had a PCN reaction occurring within the last 10 years: No   Azithromycin Nausea And Vomiting and Nausea Only   Lisinopril Cough        Sucralfate Rash and Other (See Comments)    Blurry vision  Blurry vision     Doxycycline Nausea Only    nausea   Gabapentin Other (See Comments)     Muscle pain, fatigue   Other    Tyloxapol Other (See Comments)   Amoxicillin Nausea Only   Codeine Nausea Only and Other (See Comments)    Hot flashes also   Hydrocodone Itching, Nausea Only and Rash   Omeprazole Itching and Rash   Oxycodone Nausea Only, Rash and Nausea And Vomiting   Oxycodone-Acetaminophen Nausea And Vomiting and Rash   Tramadol Itching, Nausea Only and Rash   Current Outpatient Medications on File Prior to Visit  Medication Sig Dispense Refill   acetaminophen (TYLENOL) 500 MG tablet Take 1,000 mg by mouth daily as needed for moderate pain or headache.     aspirin EC 81 MG tablet Take 81 mg by mouth daily. Swallow whole.     Cholecalciferol (THERA-D 2000) 50 MCG (2000 UT) TABS 1 tab by mouth once daily 30 tablet 99   esomeprazole (NEXIUM) 20 MG capsule Take 20 mg by mouth daily.     estradiol (ESTRACE) 0.5 MG tablet Take 0.5 mg by mouth daily.     gabapentin (NEURONTIN) 100 MG capsule Take 2 capsules (200 mg total) by mouth 3 (three) times daily as needed. 180 capsule 5   glucose blood (ONETOUCH VERIO) test strip Check blood sugar 1 time daily 100 each 3   hydrocortisone 2.5 % cream      ketoconazole (NIZORAL) 2 % cream      losartan (COZAAR) 100 MG tablet TAKE 1 TABLET BY MOUTH EVERY DAY 90 tablet 1   Probiotic CAPS Take 1 capsule by mouth daily.     promethazine (PHENERGAN) 25 MG tablet Take 25 mg by mouth daily as needed for nausea or vomiting.     rosuvastatin (CRESTOR) 10 MG tablet TAKE 1 TABLET BY MOUTH EVERY DAY 30 tablet 11   tirzepatide (MOUNJARO) 7.5 MG/0.5ML Pen Inject 7.5 mg into the skin once a week. 6 mL 3   vitamin B-12 (CYANOCOBALAMIN) 100 MCG tablet Take 100 mcg by mouth daily.     cetirizine (ZYRTEC) 10 MG tablet Take 1 tablet (10 mg total) by mouth daily. 30 tablet 11   No current facility-administered medications on file prior to visit.        ROS:  All others reviewed and negative.  Objective        PE:  BP 124/76 (BP Location: Right  Arm, Patient Position: Sitting, Cuff Size: Normal)   Pulse 75   Temp 98.6 F (37 C) (Oral)  Ht 5\' 5"  (1.651 m)   Wt 187 lb (84.8 kg)   SpO2 99%   BMI 31.12 kg/m                 Constitutional: Pt appears in NAD               HENT: Head: NCAT.                Right Ear: External ear normal.                 Left Ear: External ear normal. Bilat tm's with mild erythema.  Max sinus areas mild tender.  Pharynx with mild erythema, no exudate               Eyes: . Pupils are equal, round, and reactive to light. Conjunctivae and EOM are normal               Nose: without d/c or deformity               Neck: Neck supple. Gross normal ROM               Cardiovascular: Normal rate and regular rhythm.                 Pulmonary/Chest: Effort normal and breath sounds without rales or wheezing.                Abd:  Soft, NT, ND, + BS, no organomegaly               Neurological: Pt is alert. At baseline orientation, motor grossly intact               Skin: Skin is warm. No rashes, no other new lesions, LE edema - none               Psychiatric: Pt behavior is normal without agitation   Micro: none  Cardiac tracings I have personally interpreted today:  none  Pertinent Radiological findings (summarize): none   Lab Results  Component Value Date   WBC 6.1 12/04/2022   HGB 14.0 12/04/2022   HCT 43.0 12/04/2022   PLT 172.0 12/04/2022   GLUCOSE 85 12/04/2022   CHOL 125 12/04/2022   TRIG 129.0 12/04/2022   HDL 52.90 12/04/2022   LDLDIRECT 124.0 05/26/2020   LDLCALC 46 12/04/2022   ALT 14 12/04/2022   AST 21 12/04/2022   NA 138 12/04/2022   K 4.1 12/04/2022   CL 105 12/04/2022   CREATININE 0.76 12/04/2022   BUN 15 12/04/2022   CO2 26 12/04/2022   TSH 1.01 12/04/2022   HGBA1C 5.2 12/04/2022   MICROALBUR 1.7 08/28/2022   Assessment/Plan:  [name] is a 62 y.o. White or Caucasian [1] female with  has a past medical history of Anemia, ANEMIA-IRON DEFICIENCY (01/03/2007), Anxiety,  ANXIETY (01/03/2007), Back pain, Complication of anesthesia, Depression, DEPRESSION (01/03/2007), Diabetes (HCC), DJD (degenerative joint disease), Edema of both lower extremities, Enlarged thyroid (10/18/2019), Family history of adverse reaction to anesthesia, Fatty liver, Foot pain, GERD (01/16/2009), GERD (gastroesophageal reflux disease), Headache(784.0) (01/03/2007), Hyperplastic colon polyp, HYPERSOMNIA (04/04/2010), Hypertension, IBS (01/16/2009), IBS (irritable bowel syndrome), Impaired glucose tolerance (05/18/2011), Knee pain, Menopause, Migraine, Obesity, OSTEOARTHRITIS, KNEES, 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   URI (upper respiratory infection) Mild to mod, for antibx course levaquin 500 every day,,  to f/u any worsening symptoms or concerns  Acute ear pain, bilateral  Also for mucinex bid prn  Hypertension, uncontrolled BP Readings from Last 3 Encounters:  02/13/23 124/76  01/27/23 110/78  01/01/23 103/68   Stable, pt to continue medical treatment losartan 100 qd   B12 deficiency Lab Results  Component Value Date   VITAMINB12 462 12/04/2022   Stable, cont oral replacement - b12 1000 mcg qd   Hyperglycemia Lab Results  Component Value Date   HGBA1C 5.2 12/04/2022   Stable, pt to continue current medical treatment  - diet, wt control   Pain of left thumb Most likely djd left [facility] - for hand surgury referral, volt gel prn  Followup: Return if symptoms worsen or fail to improve.  Oliver Barre, MD 02/15/2023 7:29 PM [facility] - [facility]

## 2023-02-13 NOTE — Patient Instructions (Signed)
Please take all new medication as prescribed - the antibiotic - levaquin  Please continue all other medications as before, and refills have been done if requested.  Please have the pharmacy call with any other refills you may need.  Please keep your appointments with your specialists as you may have planned  You will be contacted regarding the referral for: hand surgury

## 2023-02-15 ENCOUNTER — Encounter: Payer: Self-pay | Admitting: Internal Medicine

## 2023-02-15 DIAGNOSIS — M79645 Pain in left finger(s): Secondary | ICD-10-CM | POA: Insufficient documentation

## 2023-02-15 DIAGNOSIS — M1812 Unilateral primary osteoarthritis of first carpometacarpal joint, left hand: Secondary | ICD-10-CM | POA: Insufficient documentation

## 2023-02-15 NOTE — Assessment & Plan Note (Signed)
Also for mucinex bid prn

## 2023-02-15 NOTE — Assessment & Plan Note (Signed)
Most likely djd left [facility] - for hand surgury referral, volt gel prn

## 2023-02-15 NOTE — Assessment & Plan Note (Signed)
Lab Results  Component Value Date   HGBA1C 5.2 12/04/2022   Stable, pt to continue current medical treatment  - diet, wt control

## 2023-02-15 NOTE — Assessment & Plan Note (Signed)
Lab Results  Component Value Date   VITAMINB12 462 12/04/2022   Stable, cont oral replacement - b12 1000 mcg qd

## 2023-02-15 NOTE — Assessment & Plan Note (Signed)
BP Readings from Last 3 Encounters:  02/13/23 124/76  01/27/23 110/78  01/01/23 103/68   Stable, pt to continue medical treatment losartan 100 qd

## 2023-02-15 NOTE — Assessment & Plan Note (Signed)
 Mild to mod, for antibx course levaquin 500 every day,,  to f/u any worsening symptoms or concerns

## 2023-02-19 ENCOUNTER — Telehealth: Payer: Self-pay | Admitting: Internal Medicine

## 2023-02-19 MED ORDER — FLUCONAZOLE 150 MG PO TABS: ORAL_TABLET | ORAL | 1 refills | Status: DC

## 2023-02-19 NOTE — Telephone Encounter (Signed)
Pt is experiencing a yeast infection due the antibiotic that was prescribed. Pt will like the one time pill you take to get rid of it. Please advise.

## 2023-02-19 NOTE — Telephone Encounter (Signed)
NA

## 2023-02-19 NOTE — Telephone Encounter (Signed)
Ok this is done 

## 2023-02-20 NOTE — Telephone Encounter (Signed)
Called and let Pt know

## 2023-03-04 ENCOUNTER — Ambulatory Visit (INDEPENDENT_AMBULATORY_CARE_PROVIDER_SITE_OTHER): Payer: 59

## 2023-03-04 DIAGNOSIS — Z23 Encounter for immunization: Secondary | ICD-10-CM | POA: Diagnosis not present

## 2023-03-04 NOTE — Progress Notes (Signed)
Patient here for flu shot. Patient tolerated well with no complications.

## 2023-03-05 ENCOUNTER — Encounter: Payer: Self-pay | Admitting: Internal Medicine

## 2023-03-05 ENCOUNTER — Ambulatory Visit: Payer: 59 | Admitting: Internal Medicine

## 2023-03-05 VITALS — BP 126/82 | HR 72 | Ht 65.0 in | Wt 185.0 lb

## 2023-03-05 DIAGNOSIS — Z7985 Long-term (current) use of injectable non-insulin antidiabetic drugs: Secondary | ICD-10-CM | POA: Diagnosis not present

## 2023-03-05 DIAGNOSIS — E042 Nontoxic multinodular goiter: Secondary | ICD-10-CM | POA: Diagnosis not present

## 2023-03-05 DIAGNOSIS — E119 Type 2 diabetes mellitus without complications: Secondary | ICD-10-CM

## 2023-03-05 LAB — POCT GLYCOSYLATED HEMOGLOBIN (HGB A1C): Hemoglobin A1C: 5.2 % (ref 4.0–5.6)

## 2023-03-05 MED ORDER — TIRZEPATIDE 10 MG/0.5ML ~~LOC~~ SOAJ: 10.0000 mg | SUBCUTANEOUS | 3 refills | Status: DC

## 2023-03-05 NOTE — Patient Instructions (Signed)
Increase Mounjaro 10 mg weekly  

## 2023-03-05 NOTE — Progress Notes (Signed)
Name: [name]  MRN/ DOB: [id number], [birth year]   Age/ Sex: 62 y.o., female    PCP: Corwin Levins, MD   Reason for Endocrinology Evaluation: Type 2 Diabetes Mellitus     Date of Initial Endocrinology Visit: 04/15/2022    PATIENT IDENTIFIER: Ms. [name] is a 62 y.o. female with a past medical history of HTN, DM. The patient presented for initial endocrinology clinic visit on 04/15/2022 for consultative assistance with her diabetes management.    HPI: [name]  is here with her daughter    Diagnosed with DM 2019 with an A1c 2019           Hemoglobin A1c has ranged from 6.6% in 2019, peaking at 7.7% in 2023.  MULTINODULAR GOITER: She has been diagnosed with multinodular goiter based on thyroid ultrasound in 2021.  She is s/p benign FNA of the left inferior nodule in April 2021  She sees [facility] for estrogen management.   SUBJECTIVE:   During the last visit (07/12/2022): A1c 5.5 %    Today (03/05/23): [name] is here mainly to discuss her neck pain.  She also follow-up on diabetes management and multinodular goiter. She checks her blood sugars occasionally.  She continues to follow-up with [facility]  She continues to follow-up with the pain management clinic through [facility], she receives glucocorticoid injections , next one next month   She denies nausea or vomiting  She takes stool softeners for constipation  Denies local neck swelling   HOME DIABETES REGIMEN: Tirzepatide 7.5 mg weekly     Statin: yes ACE-I/ARB: yes  METER DOWNLOAD SUMMARY: 8/13-9/04/2023 Fingerstick Blood Glucose Tests = 16 Average Number Tests/Day = 1 Overall Mean FS Glucose = 105 Standard Deviation = 13  BG Ranges: Low = 88 High = 143  BG Target % Results: % In target = 100 % Over target = 0 % Under target = 0  Hypoglycemic Events/30 Days: BG < 50 = 0 Episodes of symptomatic severe hypoglycemia = 0   DIABETIC  COMPLICATIONS: Microvascular complications:  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  Denies: CKD Last eye exam: Completed  07/2022  Macrovascular complications:   Denies: CAD, PVD, CVA   PAST HISTORY: Past Medical History:  Past Medical History:  Diagnosis Date   Anemia    ANEMIA-IRON DEFICIENCY 01/03/2007   Anxiety    ANXIETY 01/03/2007   Back pain    Complication of anesthesia    severe post-operative nausea except for bladder surgery in 2002 at [facility]-did not have complications then   Depression    DEPRESSION 01/03/2007   Diabetes (HCC)    DJD (degenerative joint disease)    knee   Edema of both lower extremities    Enlarged thyroid 10/18/2019   Family history of adverse reaction to anesthesia    mother and father both had severe post-operative nausea and vomiting   Fatty liver    Foot pain    GERD 01/16/2009   GERD (gastroesophageal reflux disease)    Headache(784.0) 01/03/2007   Hyperplastic colon polyp    04/2010   HYPERSOMNIA 04/04/2010   Hypertension    IBS 01/16/2009   IBS (irritable bowel syndrome)    Impaired glucose tolerance 05/18/2011   Knee pain    Menopause    early   Migraine    Obesity    OSTEOARTHRITIS, KNEES, 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 She has never used smokeless tobacco. She reports that she does not drink alcohol and does not use drugs. Family History:  Family History  Problem Relation Age of Onset   Hypertension Mother    Diabetes Brother      HOME MEDICATIONS: Allergies as of 03/05/2023       Reactions   Meperidine Shortness Of Breath   Morphine Anaphylaxis   Penicillins Hives, Nausea  Only, Swelling, Other (See Comments), Anaphylaxis, Nausea And Vomiting   PATIENT HAS HAD A PCN REACTION WITH IMMEDIATE RASH, FACIAL/TONGUE/THROAT SWELLING, SOB, OR LIGHTHEADEDNESS WITH HYPOTENSION:  #  #  YES  #  #  HAS PT DEVELOPED SEVERE RASH INVOLVING MUCUS MEMBRANES or SKIN NECROSIS: #  #  YES  #  #  PATIENT HAS HAD A PCN REACTION THAT REQUIRED HOSPITALIZATION:  #  #  YES  #  #  Has patient had a PCN reaction occurring within the last 10 years: No vomiting PATIENT HAS HAD A PCN REACTION WITH IMMEDIATE RASH, FACIAL/TONGUE/THROAT SWELLING, SOB, OR LIGHTHEADEDNESS WITH HYPOTENSION:  #  #  YES  #  #  HAS PT DEVELOPED SEVERE RASH INVOLVING MUCUS MEMBRANES or SKIN NECROSIS: #  #  YES  #  #  PATIENT HAS HAD A PCN REACTION THAT REQUIRED HOSPITALIZATION:  #  #  YES  #  #  Has patient had a PCN reaction occurring within the last 10 years: No   Azithromycin Nausea And Vomiting, Nausea Only   Lisinopril Cough       Sucralfate Rash, Other (See Comments)   Blurry vision  Blurry vision    Doxycycline Nausea Only   nausea   Gabapentin Other (See Comments)   Muscle pain, fatigue   Other    Tyloxapol Other (See Comments)   Amoxicillin Nausea Only   Codeine Nausea Only, Other (See Comments)   Hot flashes also   Hydrocodone Itching, Nausea Only, Rash   Omeprazole Itching, Rash   Oxycodone Nausea Only, Rash, Nausea And Vomiting   Oxycodone-acetaminophen Nausea And Vomiting, Rash   Tramadol Itching, Nausea Only, Rash        Medication List        Accurate as of March 05, 2023  8:44 AM. If you have any questions, ask your nurse or doctor.          STOP taking these medications    fluconazole 150 MG tablet Commonly known as: DIFLUCAN Stopped by: Johnney Ou Lucita Montoya       TAKE these medications    acetaminophen 500 MG tablet Commonly known as: TYLENOL Take 1,000 mg by mouth daily as needed for moderate pain or headache.   aspirin EC 81 MG tablet Take 81 mg by mouth daily.  Swallow whole.   cetirizine 10 MG tablet Commonly known as: ZYRTEC Take 1 tablet (10 mg total) by mouth daily.   esomeprazole 20 MG capsule Commonly known as: NEXIUM Take 20 mg by mouth daily.   estradiol 0.5 MG tablet Commonly known as: ESTRACE Take 0.5 mg by mouth daily.   gabapentin 100 MG capsule Commonly known as: NEURONTIN Take 2 capsules (200 mg total)  by mouth 3 (three) times daily as needed.   hydrocortisone 2.5 % cream   ketoconazole 2 % cream Commonly known as: NIZORAL   losartan 100 MG tablet Commonly known as: COZAAR TAKE 1 TABLET BY MOUTH EVERY DAY   OneTouch Verio test strip Generic drug: glucose blood Check blood sugar 1 time daily   Probiotic Caps Take 1 capsule by mouth daily.   promethazine 25 MG tablet Commonly known as: PHENERGAN Take 25 mg by mouth daily as needed for nausea or vomiting.   rosuvastatin 10 MG tablet Commonly known as: CRESTOR TAKE 1 TABLET BY MOUTH EVERY DAY   Thera-D 2000 50 MCG (2000 UT) Tabs Generic drug: Cholecalciferol 1 tab by mouth once daily   tirzepatide 7.5 MG/0.5ML Pen Commonly known as: MOUNJARO Inject 7.5 mg into the skin once a week.   vitamin B-12 100 MCG tablet Commonly known as: CYANOCOBALAMIN Take 100 mcg by mouth daily.         ALLERGIES: Allergies  Allergen Reactions   Meperidine Shortness Of Breath   Morphine Anaphylaxis   Penicillins Hives, Nausea Only, Swelling, Other (See Comments), Anaphylaxis and Nausea And Vomiting    PATIENT HAS HAD A PCN REACTION WITH IMMEDIATE RASH, FACIAL/TONGUE/THROAT SWELLING, SOB, OR LIGHTHEADEDNESS WITH HYPOTENSION:  #  #  YES  #  #  HAS PT DEVELOPED SEVERE RASH INVOLVING MUCUS MEMBRANES or SKIN NECROSIS: #  #  YES  #  #  PATIENT HAS HAD A PCN REACTION THAT REQUIRED HOSPITALIZATION:  #  #  YES  #  #  Has patient had a PCN reaction occurring within the last 10 years: No  vomiting PATIENT HAS HAD A PCN REACTION WITH IMMEDIATE RASH, FACIAL/TONGUE/THROAT  SWELLING, SOB, OR LIGHTHEADEDNESS WITH HYPOTENSION:  #  #  YES  #  #  HAS PT DEVELOPED SEVERE RASH INVOLVING MUCUS MEMBRANES or SKIN NECROSIS: #  #  YES  #  #  PATIENT HAS HAD A PCN REACTION THAT REQUIRED HOSPITALIZATION:  #  #  YES  #  #  Has patient had a PCN reaction occurring within the last 10 years: No   Azithromycin Nausea And Vomiting and Nausea Only   Lisinopril Cough        Sucralfate Rash and Other (See Comments)    Blurry vision  Blurry vision     Doxycycline Nausea Only    nausea   Gabapentin Other (See Comments)    Muscle pain, fatigue   Other    Tyloxapol Other (See Comments)   Amoxicillin Nausea Only   Codeine Nausea Only and Other (See Comments)    Hot flashes also   Hydrocodone Itching, Nausea Only and Rash   Omeprazole Itching and Rash   Oxycodone Nausea Only, Rash and Nausea And Vomiting   Oxycodone-Acetaminophen Nausea And Vomiting and Rash   Tramadol Itching, Nausea Only and Rash     REVIEW OF SYSTEMS: A comprehensive ROS was conducted with the patient and is negative except as per HPI    OBJECTIVE:   VITAL SIGNS: BP 126/82 (BP Location: Left Arm, Patient Position: Sitting, Cuff Size: Large)   Pulse 72   Ht 5\' 5"  (1.651 m)   Wt 185 lb (83.9 kg)   SpO2 99%   BMI 30.79 kg/m    PHYSICAL EXAM:  General: Pt appears well and is in NAD  Neck: General: Patient with tenderness over the lateral aspect of posterior neck, with tenderness with flexion and right neck rotation Thyroid: Thyromegaly noted,  no tenderness  Lungs: Clear with good BS bilat   Heart: RRR   Extremities:  Lower extremities - No pretibial edema  Neuro: MS is good with appropriate affect, pt is alert and Ox3    DM foot exam: 07/13/2022 podiatry   DATA REVIEWED:  Lab Results  Component Value Date   HGBA1C 5.2 03/05/2023   HGBA1C 5.2 12/04/2022   HGBA1C 5.5 08/28/2022         Latest Reference Range & Units 12/04/22 10:56  Sodium 135 - 145 mEq/L 138  Potassium 3.5 - 5.1 mEq/L  4.1  Chloride 96 - 112 mEq/L 105  CO2 19 - 32 mEq/L 26  Glucose 70 - 99 mg/dL 85  BUN 6 - 23 mg/dL 15  Creatinine 2.59 - 5.63 mg/dL 8.75  Calcium 8.4 - 64.3 mg/dL 9.8  Alkaline Phosphatase 39 - 117 U/L 48  Albumin 3.5 - 5.2 g/dL 4.5  AST 0 - 37 U/L 21  ALT 0 - 35 U/L 14  Total Protein 6.0 - 8.3 g/dL 7.9  Bilirubin, Direct 0.0 - 0.3 mg/dL 0.1  Total Bilirubin 0.2 - 1.2 mg/dL 0.4  GFR >32.95 mL/min 83.53  Total CHOL/HDL Ratio  2  Cholesterol 0 - 200 mg/dL 188  HDL Cholesterol >41.66 mg/dL 06.30  LDL (calc) 0 - 99 mg/dL 46  NonHDL  16.01  Triglycerides 0.0 - 149.0 mg/dL 093.2  VLDL 0.0 - 35.5 mg/dL 73.2    Latest Reference Range & Units 12/04/22 10:56  VITD 30.00 - 100.00 ng/mL 34.84  Vitamin B12 211 - 911 pg/mL 462  WBC 4.0 - 10.5 K/uL 6.1  RBC 3.87 - 5.11 Mil/uL 4.86  Hemoglobin 12.0 - 15.0 g/dL 20.2  HCT 54.2 - 70.6 % 43.0  MCV 78.0 - 100.0 fl 88.6  MCHC 30.0 - 36.0 g/dL 23.7 (C)  RDW 62.8 - 31.5 % 13.4  Platelets 150.0 - 400.0 K/uL 172.0  Neutrophils 43.0 - 77.0 % 44.0  Lymphocytes 12.0 - 46.0 % 45.3  Monocytes Relative 3.0 - 12.0 % 6.6  Eosinophil 0.0 - 5.0 % 3.0  Basophil 0.0 - 3.0 % 1.1  NEUT# 1.4 - 7.7 K/uL 2.7  Lymphocyte # 0.7 - 4.0 K/uL 2.8  Monocyte # 0.1 - 1.0 K/uL 0.4  Eosinophils Absolute 0.0 - 0.7 K/uL 0.2  Basophils Absolute 0.0 - 0.1 K/uL 0.1    Latest Reference Range & Units 12/04/22 10:56  Glucose 70 - 99 mg/dL 85  Hemoglobin V7O 4.6 - 6.5 % 5.2  TSH 0.35 - 5.50 uIU/mL 1.01     THYROID ULTRASOUND 09/18/2022  Estimated total number of nodules >/= 1 cm:   Number of spongiform nodules >/=  2 cm not described below (TR1): 0   Number of mixed cystic and solid nodules >/= 1.5 cm not described below (TR2): 0   _________________________________________________________   Nodule labeled 1 is a previously described spongiform TR 1 nodule in the superior right thyroid lobe measuring up to 1.5 cm, previously 1.4 cm. It remains unchanged. This nodule  does NOT meet TI-RADS criteria for biopsy or dedicated follow-up.   Nodule labeled 2 is a small subcentimeter spongiform TR 1 nodule in the inferior right thyroid lobe. This nodule does NOT meet TI-RADS criteria for biopsy or dedicated follow-up.   Nodule labeled 3 in the mid left thyroid lobe measures up to 1.3 cm and is a solid hypoechoic TR 4 nodule (independently measured on image 41). This nodule (previously labeled 6) had measured 1.4 cm on most recent  comparison, and 1.4 cm in April 2021. It remains similar in size and morphology. *Given size (>/= 1 - 1.4 cm) and appearance, a follow-up ultrasound in 1 year should be considered based on TI-RADS criteria.   Nodule labeled 4 in the inferior left thyroid lobe was previously biopsied, measuring up to 1.8 cm on today's exam, previously reported 1.9 cm. This nodule was independently measured on image 49. There appears to remain similar in size and morphology.   IMPRESSION: 1. Enlarged multinodular thyroid gland. No new or enlarging thyroid nodules. 2. Similar appearance of previously biopsied nodule labeled 4 (previously 7) in the inferior left thyroid lobe. Correlate with biopsy results. 3. Similar appearance of nodule labeled 3 (previously 6; 1.3 cm TR 4) in the mid left thyroid lobe. This nodule continues to meet criteria for follow-up ultrasound in 1 year. This exam marks 3 year stability. A total follow-up interval of 5 years is recommended.     FNA left inferior thyroid nodule 10/14/2019   Clinical History: None provided  Specimen Submitted:  A. THYROID,LEFT INFERIOR, FINE NEEDLE ASPIRATION:    FINAL MICROSCOPIC DIAGNOSIS:  - Consistent with benign follicular nodule (Bethesda category II)    Old records , labs and images have been reviewed.    ASSESSMENT / PLAN / RECOMMENDATIONS:   1) Type 2 Diabetes Mellitus, Optimally  controlled, With neuropathic  complications - Most recent A1c of 5.2%. Goal A1c < 7.0 %.     -Her A1c remains at goal -Patient has noted plateauing of the weight, we opted to increase Mounjaro as below -Most recent labs through PCPs office show normal GFR, MA/CR ratio   MEDICATIONS: Increase Mounjaro 10 mg weekly  EDUCATION / INSTRUCTIONS: BG monitoring instructions: Patient is instructed to check her blood sugars 2-3 times a week. Call [facility] if: BG persistently < 70  I reviewed the Rule of 15 for the treatment of hypoglycemia in detail with the patient. Literature supplied.   2) Diabetic complications:  Eye: Does not have known diabetic retinopathy.  Neuro/ Feet: Does have known diabetic peripheral neuropathy. Renal: Patient does not have known baseline CKD. She is  on an ACEI/ARB at present.  3) MULTINODULAR GOITER:   -She is s/p benign FNA of the left thyroid nodule in 2021 -Repeat thyroid ultrasound 08/2022 shows stability -TSH was normal 11/2022  Follow-up in 6 months   Signed electronically by: Lyndle Herrlich, MD  [facility]  [facility] [address] [place] [place], [place] [address] Phone: [phone] FAX: [phone]   CC: Corwin Levins, MD [address] [place] [address] Phone: [phone]  Fax: [phone]    Return to Endocrinology clinic as below: Future Appointments  Date Time Provider Department Center  03/24/2023  8:20 AM Worthy Rancher, MD [facility] None  06/05/2023  9:40 AM Corwin Levins, MD [facility] None  06/23/2023  8:40 AM Chilton Si, MD [facility]

## 2023-03-10 ENCOUNTER — Other Ambulatory Visit: Payer: Self-pay | Admitting: Internal Medicine

## 2023-03-24 ENCOUNTER — Ambulatory Visit (INDEPENDENT_AMBULATORY_CARE_PROVIDER_SITE_OTHER): Payer: 59 | Admitting: Internal Medicine

## 2023-05-29 LAB — COLOGUARD: COLOGUARD: NEGATIVE

## 2023-05-29 LAB — EXTERNAL GENERIC LAB PROCEDURE: COLOGUARD: NEGATIVE

## 2023-06-05 ENCOUNTER — Ambulatory Visit: Payer: 59 | Admitting: Internal Medicine

## 2023-06-05 ENCOUNTER — Encounter: Payer: Self-pay | Admitting: Internal Medicine

## 2023-06-05 VITALS — BP 122/78 | HR 62 | Temp 98.5°F | Ht 65.0 in | Wt 180.0 lb

## 2023-06-05 DIAGNOSIS — E785 Hyperlipidemia, unspecified: Secondary | ICD-10-CM | POA: Diagnosis not present

## 2023-06-05 DIAGNOSIS — E559 Vitamin D deficiency, unspecified: Secondary | ICD-10-CM | POA: Diagnosis not present

## 2023-06-05 DIAGNOSIS — I10 Essential (primary) hypertension: Secondary | ICD-10-CM

## 2023-06-05 DIAGNOSIS — E1165 Type 2 diabetes mellitus with hyperglycemia: Secondary | ICD-10-CM | POA: Diagnosis not present

## 2023-06-05 DIAGNOSIS — J309 Allergic rhinitis, unspecified: Secondary | ICD-10-CM

## 2023-06-05 DIAGNOSIS — E099 Drug or chemical induced diabetes mellitus without complications: Secondary | ICD-10-CM

## 2023-06-05 DIAGNOSIS — E538 Deficiency of other specified B group vitamins: Secondary | ICD-10-CM

## 2023-06-05 DIAGNOSIS — Z7984 Long term (current) use of oral hypoglycemic drugs: Secondary | ICD-10-CM

## 2023-06-05 DIAGNOSIS — T380X5S Adverse effect of glucocorticoids and synthetic analogues, sequela: Secondary | ICD-10-CM

## 2023-06-05 LAB — BASIC METABOLIC PANEL
BUN: 16 mg/dL (ref 6–23)
CO2: 26 meq/L (ref 19–32)
Calcium: 9.4 mg/dL (ref 8.4–10.5)
Chloride: 107 meq/L (ref 96–112)
Creatinine, Ser: 0.81 mg/dL (ref 0.40–1.20)
GFR: 77.11 mL/min (ref >60.00)
Glucose, Bld: 83 mg/dL (ref 70–99)
Potassium: 3.9 meq/L (ref 3.5–5.1)
Sodium: 141 meq/L (ref 135–145)

## 2023-06-05 LAB — HEPATIC FUNCTION PANEL
ALT: 15 U/L (ref 0–35)
AST: 17 U/L (ref 0–37)
Albumin: 4.4 g/dL (ref 3.5–5.2)
Alkaline Phosphatase: 48 U/L (ref 39–117)
Bilirubin, Direct: 0.1 mg/dL (ref 0.0–0.3)
Total Bilirubin: 0.4 mg/dL (ref 0.2–1.2)
Total Protein: 7.3 g/dL (ref 6.0–8.3)

## 2023-06-05 LAB — LIPID PANEL
Cholesterol: 145 mg/dL (ref 0–200)
HDL: 58.6 mg/dL (ref >39.00)
LDL Cholesterol: 70 mg/dL (ref 0–99)
NonHDL: 86.22
Total CHOL/HDL Ratio: 2
Triglycerides: 83 mg/dL (ref 0.0–149.0)
VLDL: 16.6 mg/dL (ref 0.0–40.0)

## 2023-06-05 LAB — VITAMIN B12: Vitamin B-12: 650 pg/mL (ref 211–911)

## 2023-06-05 LAB — HEMOGLOBIN A1C: Hgb A1c MFr Bld: 5.3 % (ref 4.6–6.5)

## 2023-06-05 LAB — VITAMIN D 25 HYDROXY (VIT D DEFICIENCY, FRACTURES): VITD: 37.87 ng/mL (ref 30.00–100.00)

## 2023-06-05 MED ORDER — TIRZEPATIDE 12.5 MG/0.5ML ~~LOC~~ SOAJ: 12.5000 mg | SUBCUTANEOUS | 11 refills | Status: DC

## 2023-06-05 NOTE — Patient Instructions (Signed)
Ok to increase the mounjarot o 12.5 mg weekly  Please continue all other medications as before, and refills have been done if requested.  Please have the pharmacy call with any other refills you may need.  Please continue your efforts at being more active, low cholesterol diet, and weight control.  Please keep your appointments with your specialists as you may have planned  Please go to the LAB at the blood drawing area for the tests to be done  You will be contacted by phone if any changes need to be made immediately.  Otherwise, you will receive a letter about your results with an explanation, but please check with MyChart first.  Please make an Appointment to return in 6 months, or sooner if needed

## 2023-06-05 NOTE — Progress Notes (Signed)
Patient ID: [name], female   DOB: [birth year], 62 y.o.   MRN: [id number] .       Chief Complaint: follow up HTN, HLD, dm, low vit d, obesity, allergies       HPI:  [name] is a 62 y.o. female here overall doing ok but unable to lose further wt; Pt denies chest pain, increased sob or doe, wheezing, orthopnea, PND, increased LE swelling, palpitations, dizziness or syncope. Pt denies polydipsia, polyuria, or new focal neuro s/s.    Pt denies fever, wt loss, night sweats, loss of appetite, or other constitutional symptoms   Has seen Dr Amanda Pea for bilat thumb djd and has menitoned surgury but not scheduled yet.  Peak wt has been 250 in past.  Does have several wks ongoing nasal allergy symptoms with clearish congestion, itch and sneezing, without fever, pain, ST, cough, swelling or wheezing. Wt Readings from Last 3 Encounters:  06/05/23 180 lb (81.6 kg)  03/05/23 185 lb (83.9 kg)  02/13/23 187 lb (84.8 kg)   BP Readings from Last 3 Encounters:  06/05/23 122/78  03/05/23 126/82  02/13/23 124/76         Past Medical History:  Diagnosis Date   Anemia    ANEMIA-IRON DEFICIENCY 01/03/2007   Anxiety    ANXIETY 01/03/2007   Back pain    Complication of anesthesia    severe post-operative nausea except for bladder surgery in 2002 at [facility]-did not have complications then   Depression    DEPRESSION 01/03/2007   Diabetes (HCC)    DJD (degenerative joint disease)    knee   Edema of both lower extremities    Enlarged thyroid 10/18/2019   Family history of adverse reaction to anesthesia    mother and father both had severe post-operative nausea and vomiting   Fatty liver    Foot pain    GERD 01/16/2009   GERD (gastroesophageal reflux disease)    Headache(784.0) 01/03/2007   Hyperplastic colon polyp    04/2010   HYPERSOMNIA 04/04/2010   Hypertension    IBS 01/16/2009   IBS (irritable bowel syndrome)    Impaired glucose tolerance 05/18/2011   Knee pain    Menopause     early   Migraine    Obesity    OSTEOARTHRITIS, KNEES, 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 She has never used smokeless tobacco. She reports that she does not drink alcohol and does not use drugs. family history includes Diabetes in her brother; Hypertension in her mother. Allergies  Allergen Reactions   Meperidine Shortness Of Breath   Morphine Anaphylaxis   Penicillins Hives, Nausea Only, Swelling, Other (See Comments), Anaphylaxis and Nausea And Vomiting    PATIENT HAS HAD A PCN REACTION WITH IMMEDIATE RASH, FACIAL/TONGUE/THROAT SWELLING, SOB, OR LIGHTHEADEDNESS WITH HYPOTENSION:  #  #  YES  #  #  HAS PT DEVELOPED SEVERE RASH INVOLVING MUCUS MEMBRANES or SKIN NECROSIS: #  #  YES  #  #  PATIENT HAS HAD A PCN REACTION THAT REQUIRED HOSPITALIZATION:  #  #  YES  #  #  Has patient  had a PCN reaction occurring within the last 10 years: No  vomiting PATIENT HAS HAD A PCN REACTION WITH IMMEDIATE RASH, FACIAL/TONGUE/THROAT SWELLING, SOB, OR LIGHTHEADEDNESS WITH HYPOTENSION:  #  #  YES  #  #  HAS PT DEVELOPED SEVERE RASH INVOLVING MUCUS MEMBRANES or SKIN NECROSIS: #  #  YES  #  #  PATIENT HAS HAD A PCN REACTION THAT REQUIRED HOSPITALIZATION:  #  #  YES  #  #  Has patient had a PCN reaction occurring within the last 10 years: No   Azithromycin Nausea And Vomiting and Nausea Only   Lisinopril Cough        Sucralfate Rash and Other (See Comments)    Blurry vision  Blurry vision     Doxycycline Nausea Only    nausea   Gabapentin Other (See Comments)    Muscle pain, fatigue   Other    Tyloxapol Other (See Comments)   Amoxicillin Nausea Only   Codeine Nausea Only and Other (See Comments)     Hot flashes also   Hydrocodone Itching, Nausea Only and Rash   Omeprazole Itching and Rash   Oxycodone Nausea Only, Rash and Nausea And Vomiting   Oxycodone-Acetaminophen Nausea And Vomiting and Rash   Tramadol Itching, Nausea Only and Rash   Current Outpatient Medications on File Prior to Visit  Medication Sig Dispense Refill   acetaminophen (TYLENOL) 500 MG tablet Take 1,000 mg by mouth daily as needed for moderate pain or headache.     aspirin EC 81 MG tablet Take 81 mg by mouth daily. Swallow whole.     Cholecalciferol (THERA-D 2000) 50 MCG (2000 UT) TABS 1 tab by mouth once daily 30 tablet 99   esomeprazole (NEXIUM) 20 MG capsule Take 20 mg by mouth daily.     estradiol (ESTRACE) 0.5 MG tablet Take 0.5 mg by mouth daily.     gabapentin (NEURONTIN) 100 MG capsule Take 2 capsules (200 mg total) by mouth 3 (three) times daily as needed. 180 capsule 5   hydrocortisone 2.5 % cream      ketoconazole (NIZORAL) 2 % cream      losartan (COZAAR) 100 MG tablet TAKE 1 TABLET BY MOUTH EVERY DAY 90 tablet 1   ONETOUCH VERIO test strip CHECK BLOOD SUGAR 1 TIME DAILY 50 strip 7   Probiotic CAPS Take 1 capsule by mouth daily.     promethazine (PHENERGAN) 25 MG tablet Take 25 mg by mouth daily as needed for nausea or vomiting.     rosuvastatin (CRESTOR) 10 MG tablet TAKE 1 TABLET BY MOUTH EVERY DAY 30 tablet 11   vitamin B-12 (CYANOCOBALAMIN) 100 MCG tablet Take 100 mcg by mouth daily.     cetirizine (ZYRTEC) 10 MG tablet Take 1 tablet (10 mg total) by mouth daily. 30 tablet 11   No current facility-administered medications on file prior to visit.        ROS:  All others reviewed and negative.  Objective        PE:  BP 122/78 (BP Location: Right Arm, Patient Position: Sitting, Cuff Size: Normal)   Pulse 62   Temp 98.5 F (36.9 C) (Oral)   Ht 5\' 5"  (1.651 m)   Wt 180 lb (81.6 kg)   SpO2 98%   BMI 29.95 kg/m                 Constitutional: Pt appears in NAD  HENT: Head: NCAT.                 Right Ear: External ear normal.                 Left Ear: External ear normal.                Eyes: . Pupils are equal, round, and reactive to light. Conjunctivae and EOM are normal               Nose: without d/c or deformity               Neck: Neck supple. Gross normal ROM               Cardiovascular: Normal rate and regular rhythm.                 Pulmonary/Chest: Effort normal and breath sounds without rales or wheezing.                Abd:  Soft, NT, ND, + BS, no organomegaly               Neurological: Pt is alert. At baseline orientation, motor grossly intact               Skin: Skin is warm. No rashes, no other new lesions, LE edema - none               Psychiatric: Pt behavior is normal without agitation   Micro: none  Cardiac tracings I have personally interpreted today:  none  Pertinent Radiological findings (summarize): none   Lab Results  Component Value Date   WBC 6.1 12/04/2022   HGB 14.0 12/04/2022   HCT 43.0 12/04/2022   PLT 172.0 12/04/2022   GLUCOSE 83 06/05/2023   CHOL 145 06/05/2023   TRIG 83.0 06/05/2023   HDL 58.60 06/05/2023   LDLDIRECT 124.0 05/26/2020   LDLCALC 70 06/05/2023   ALT 15 06/05/2023   AST 17 06/05/2023   NA 141 06/05/2023   K 3.9 06/05/2023   CL 107 06/05/2023   CREATININE 0.81 06/05/2023   BUN 16 06/05/2023   CO2 26 06/05/2023   TSH 1.01 12/04/2022   HGBA1C 5.3 06/05/2023   MICROALBUR 1.7 08/28/2022   Assessment/Plan:  ZARRAH TESKA is a 62 y.o. White or Caucasian [1] female with  has a past medical history of Anemia, ANEMIA-IRON DEFICIENCY (01/03/2007), Anxiety, ANXIETY (01/03/2007), Back pain, Complication of anesthesia, Depression, DEPRESSION (01/03/2007), Diabetes (HCC), DJD (degenerative joint disease), Edema of both lower extremities, Enlarged thyroid (10/18/2019), Family history of adverse reaction to anesthesia, Fatty liver, Foot pain, GERD (01/16/2009), GERD (gastroesophageal reflux disease),  Headache(784.0) (01/03/2007), Hyperplastic colon polyp, HYPERSOMNIA (04/04/2010), Hypertension, IBS (01/16/2009), IBS (irritable bowel syndrome), Impaired glucose tolerance (05/18/2011), Knee pain, Menopause, Migraine, Obesity, OSTEOARTHRITIS, KNEES, 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  Vitamin D deficiency Last vitamin D Lab Results  Component Value Date   VD25OH 37.87 06/05/2023   Low, to start oral replacement   Type 2 diabetes mellitus (HCC) Lab Results  Component Value Date   HGBA1C 5.3 06/05/2023   In the setting of uncontrolled obesity  -  pt for increased mounjaro to 12.5 mg weekly   Hypertension, uncontrolled BP Readings from Last 3 Encounters:  06/05/23 122/78  03/05/23 126/82  02/13/23 124/76   Stable, pt to continue medical treatment losartan 100 mg qd   Dyslipidemia Lab Results  Component Value  Date   LDLCALC 70 06/05/2023   Uncontrolled, goal ldl < 70, pt to continue crestor 10 mg every day and lower chol diet, declines other change for now   Allergic rhinitis Mild to mod, for OTC allegra prn,,  to f/u any worsening symptoms or concerns  Followup: Return in about 6 months (around 12/04/2023).  Oliver Barre, MD 06/08/2023 6:17 PM [facility]

## 2023-06-05 NOTE — Progress Notes (Signed)
The test results show that your current treatment is OK, as the tests are stable.  Please continue the same plan.  There is no other need for change of treatment or further evaluation based on these results, at this time.  thanks 

## 2023-06-08 ENCOUNTER — Encounter: Payer: Self-pay | Admitting: Internal Medicine

## 2023-06-08 NOTE — Assessment & Plan Note (Signed)
Mild to mod, for OTC allegra prn,,  to f/u any worsening symptoms or concerns

## 2023-06-08 NOTE — Assessment & Plan Note (Signed)
Lab Results  Component Value Date   HGBA1C 5.3 06/05/2023   In the setting of uncontrolled obesity  -  pt for increased mounjaro to 12.5 mg weekly

## 2023-06-08 NOTE — Assessment & Plan Note (Signed)
BP Readings from Last 3 Encounters:  06/05/23 122/78  03/05/23 126/82  02/13/23 124/76   Stable, pt to continue medical treatment losartan 100 mg qd

## 2023-06-08 NOTE — Assessment & Plan Note (Signed)
Lab Results  Component Value Date   LDLCALC 70 06/05/2023   Uncontrolled, goal ldl < 70, pt to continue crestor 10 mg every day and lower chol diet, declines other change for now

## 2023-06-08 NOTE — Assessment & Plan Note (Signed)
Last vitamin D Lab Results  Component Value Date   VD25OH 37.87 06/05/2023   Low, to start oral replacement

## 2023-06-09 ENCOUNTER — Other Ambulatory Visit: Payer: Self-pay | Admitting: Internal Medicine

## 2023-06-09 ENCOUNTER — Other Ambulatory Visit: Payer: Self-pay

## 2023-06-19 ENCOUNTER — Other Ambulatory Visit (HOSPITAL_BASED_OUTPATIENT_CLINIC_OR_DEPARTMENT_OTHER): Payer: Self-pay | Admitting: Nurse Practitioner

## 2023-06-19 ENCOUNTER — Other Ambulatory Visit: Payer: Self-pay | Admitting: Internal Medicine

## 2023-06-23 ENCOUNTER — Ambulatory Visit (HOSPITAL_BASED_OUTPATIENT_CLINIC_OR_DEPARTMENT_OTHER): Payer: 59 | Admitting: Cardiovascular Disease

## 2023-06-23 ENCOUNTER — Telehealth: Payer: Self-pay | Admitting: Internal Medicine

## 2023-06-23 ENCOUNTER — Encounter (HOSPITAL_BASED_OUTPATIENT_CLINIC_OR_DEPARTMENT_OTHER): Payer: Self-pay | Admitting: Cardiovascular Disease

## 2023-06-23 VITALS — BP 106/68 | HR 70 | Wt 180.2 lb

## 2023-06-23 DIAGNOSIS — I10 Essential (primary) hypertension: Secondary | ICD-10-CM | POA: Diagnosis not present

## 2023-06-23 NOTE — Patient Instructions (Signed)
Medication Instructions:  Your physician recommends that you continue on your current medications as directed. Please refer to the Current Medication list given to you today.   *If you need a refill on your cardiac medications before your next appointment, please call your pharmacy*   Lab Work: None ordered   If you have labs (blood work) drawn today and your tests are completely normal, you will receive your results only by: MyChart Message (if you have MyChart) OR A paper copy in the mail If you have any lab test that is abnormal or we need to change your treatment, we will call you to review the results.   Testing/Procedures: None ordered    Follow-Up: At [facility], you and your health needs are our priority.  As part of our continuing mission to provide you with exceptional heart care, we have created designated Provider Care Teams.  These Care Teams include your primary Cardiologist (physician) and Advanced Practice Providers (APPs -  Physician Assistants and Nurse Practitioners) who all work together to provide you with the care you need, when you need it.  We recommend signing up for the patient portal called "MyChart".  Sign up information is provided on this After Visit Summary.  MyChart is used to connect with patients for Virtual Visits (Telemedicine).  Patients are able to view lab/test results, encounter notes, upcoming appointments, etc.  Non-urgent messages can be sent to your provider as well.   To learn more about what you can do with MyChart, go to [email or web address].    Your next appointment:   12 month(s)  Provider:   Chilton Si, MD     Other Instructions

## 2023-06-23 NOTE — Telephone Encounter (Signed)
Pt has dropped off a handicapped placard form, and it has been placed in the providers box.  Please call pt when ready for pickup: [phone]

## 2023-06-23 NOTE — Progress Notes (Signed)
Cardiology Office Note:  .   Date:  06/23/2023  ID:  [name], DOB 04-05-1961, MRN [id number] PCP: Corwin Levins, MD  [facility] Providers Cardiologist:  Chilton Si, MD    History of Present Illness: .    [name] is a 62 y.o. female with hypertension, hyperlipidemia, gastroesophageal reflux disease and depression who presents for follow up.  [name] was referred 04/2015 for a complaint of dizziness and dyspnea on exertion.  EKG showed sinus rhythm with low voltage and poor R wave progression.  BNP was 17.  The episodes occurred with exertion and were associated with a flushed feeling.  Her symptoms were felt to be neurocardiogenic and she was asked to wear compression stockings (also to help with her varicose veins) and increase her fluid intake.  Lab testing for pheochromocytoma and carcinoid were negative.  She underwent Lexiscan Cardiolite 04/2015 that was negative for ischemia and echocardiogram 05/2015 revealed grade 1 diastolic dysfunction.  Her blood pressure was intermittently elevated. She reported having a lot of anxiety and stress at the time. She was instructed to follow-up with her PCP or psychiatrist to discuss her anxiety.  Her blood pressure remained elevated so she was started on lisinopril.  This was switched to losartan due to cough. She reported lower extremity numbness and claudication.  She was referred for ABIs 01/30/17 that were normal bilaterally. Her thyroid was enlarged and she was referred for thyroid ultrasound which revealed multiple bilateral nodules.  She underwent thyroid biopsy which was consistent with benign follicular nodule and it was recommended that she have serial ultrasounds.   Her blood pressure was mildly elevated but no changes were made because it was well controlled at home. At her visit 10/2021 her BP was running high but she attributed that to pain.   She reported chest tightness that she attributed to elevated blood  pressures.  Losartan was increased and her BP improved.  She was prescribed Ozempic but stopped it due to concern about thyroid disease.  She was referred to [facility].  She struggles with sleeping at night because of her chronic pain.   [name] has been managing her health well. She has been receiving injections for back pain and is scheduled for thumb injections.  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 She has been managing her diabetes with a focus on dietary changes, specifically increasing protein intake. However, she admits to struggling with maintaining a healthy diet, particularly under stress, and resorting to junk food. Despite this, she has lost a significant amount of weight and has noticed a difference in how her body feels.  She has been experiencing some dizziness, which she attributes to her injections.  She has no orthostatic lightheadedness.  She also reports a change in her feet, which have been feeling cold, particularly at night. This is a change from her usual experience of having hot feet.  [name] has been taking losartan for her hypertension and has been considering whether to continue this medication. She has also been taking a protein shake called Biocare, which she believes helps with her diabetes management.  She has a family history of thyroid issues, but her own thyroid function has been normal. She has been managing stress and interpersonal issues at home, which she acknowledges could impact her blood pressure.  Overall, the patient is proactive in managing her health and is committed to making lifestyle changes to improve her wellbeing.  She is aware of the importance of regular exercise and is exploring options to incorporate more physical activity into her daily routine.      ROS: As per HPI   Studies Reviewed: Marland Kitchen    Calcium score 07/26/22: IMPRESSION: Coronary calcium score of 108. This was 87th percentile for  age-, race-, and sex-matched controls.EKG Interpretation Date/Time:  Monday June 23 2023 08:25:35 EST Ventricular Rate:  70 PR Interval:  174 QRS Duration:  88 QT Interval:  374 QTC Calculation: 403 R Axis:   -3  Text Interpretation: Normal sinus rhythm Low voltage QRS Possible Inferior infarct (cited on or before 10-Aug-2021) When compared with ECG of 10-Aug-2021 11:40, No significant change was found Confirmed by Chilton Si ([id number]) on 06/23/2023 8:30:16 AM    Risk Assessment/Calculations:             Physical Exam:   VS:  BP 106/68 (BP Location: Left Arm, Patient Position: Sitting, Cuff Size: Normal)   Pulse 70   Wt 180 lb 3.2 oz (81.7 kg)   SpO2 98%   BMI 29.99 kg/m    Wt Readings from Last 3 Encounters:  06/23/23 180 lb 3.2 oz (81.7 kg)  06/05/23 180 lb (81.6 kg)  03/05/23 185 lb (83.9 kg)    GEN: Well nourished, well developed in no acute distress NECK: No JVD; No carotid bruits CARDIAC: RRR, no murmurs, rubs, gallops RESPIRATORY:  Clear to auscultation without rales, wheezing or rhonchi  ABDOMEN: Soft, non-tender, non-distended EXTREMITIES:  No edema; No deformity   ASSESSMENT AND PLAN: .    # Coronary Artery Disease Calcium score of 108 indicating presence of plaque. Discussed the importance of continuing aspirin and statin therapy for plaque stabilization and potential reversal of soft plaque. -Continue aspirin and statin therapy.  # Type 2 Diabetes Improved A1c from 8.2 to 5.2 over the past year. Discussed the importance of maintaining current medication regimen and lifestyle modifications. -Continue current diabetes medication regimen. -Encourage continued lifestyle modifications including diet and exercise.  # Hypertension Blood pressure readings varying from 106 to 122 systolic. Discussed the potential for reducing losartan dosage if average readings are in the 110s, but also the importance of maintaining current dosage if readings are in the  upper 120s. -Continue losartan 100mg  daily. -Advise patient to monitor blood pressure regularly and report readings.  # Chronic Back Pain Receiving injections for pain management. No changes or concerns discussed. -Continue current pain management plan.  # General Health Maintenance -Encourage continued physical activity. -Consider participation in exercise program such as [employer]. -Annual thyroid function check due to family history of thyroid disorders.      Dispo: f/u in 1 year  Signed, Chilton Si, MD

## 2023-06-26 NOTE — Telephone Encounter (Signed)
 Patient is requesting call back regarding status of form. [phone]

## 2023-06-26 NOTE — Telephone Encounter (Signed)
 Placed on provider desk

## 2023-06-27 NOTE — Telephone Encounter (Signed)
 Called and left voicemail letting Pt know form is ready for pickup.

## 2023-06-27 NOTE — Telephone Encounter (Signed)
Patient has picked up form

## 2023-08-05 ENCOUNTER — Other Ambulatory Visit (HOSPITAL_BASED_OUTPATIENT_CLINIC_OR_DEPARTMENT_OTHER): Payer: Self-pay | Admitting: *Deleted

## 2023-08-05 MED ORDER — LOSARTAN POTASSIUM 100 MG PO TABS: 100.0000 mg | ORAL_TABLET | Freq: Every day | ORAL | 3 refills | Status: DC

## 2023-08-07 ENCOUNTER — Telehealth: Payer: Self-pay | Admitting: Internal Medicine

## 2023-08-07 NOTE — Telephone Encounter (Signed)
Copied from CRM [id number]. Topic: Clinical - Prescription Issue >> Aug 07, 2023  2:34 PM Isabell A wrote: Reason for CRM: Patient states she went to pick up her Mounjaro, but its costing $1,200.00. Patient also states she would rather the 10mg  instead of 12.5mg .

## 2023-08-07 NOTE — Telephone Encounter (Signed)
I can send the 10 mg to [facility] for zepbound at $599  Let me know please if she still wants this    thanks

## 2023-08-13 MED ORDER — TIRZEPATIDE 10 MG/0.5ML ~~LOC~~ SOAJ: 10.0000 mg | SUBCUTANEOUS | 3 refills | Status: DC

## 2023-08-13 NOTE — Telephone Encounter (Signed)
 Ok this is done

## 2023-08-13 NOTE — Telephone Encounter (Signed)
Called and Pt stated she was able to get the 10mg  at no charge so she would like to keep it at the same pharmacy.

## 2023-09-03 ENCOUNTER — Encounter: Payer: Self-pay | Admitting: Internal Medicine

## 2023-09-03 ENCOUNTER — Ambulatory Visit: Payer: 59 | Admitting: Internal Medicine

## 2023-09-03 VITALS — BP 120/70 | HR 75 | Ht 65.0 in | Wt 179.0 lb

## 2023-09-03 DIAGNOSIS — E042 Nontoxic multinodular goiter: Secondary | ICD-10-CM | POA: Diagnosis not present

## 2023-09-03 DIAGNOSIS — Z7985 Long-term (current) use of injectable non-insulin antidiabetic drugs: Secondary | ICD-10-CM | POA: Diagnosis not present

## 2023-09-03 DIAGNOSIS — E119 Type 2 diabetes mellitus without complications: Secondary | ICD-10-CM

## 2023-09-03 LAB — POCT GLYCOSYLATED HEMOGLOBIN (HGB A1C): Hemoglobin A1C: 5 % (ref 4.0–5.6)

## 2023-09-03 LAB — MICROALBUMIN / CREATININE URINE RATIO
Creatinine, Urine: 167 mg/dL (ref 20–275)
Microalb Creat Ratio: 5 mg/g{creat} (ref <30)
Microalb, Ur: 0.8 mg/dL

## 2023-09-03 MED ORDER — TIRZEPATIDE 12.5 MG/0.5ML ~~LOC~~ SOAJ: 12.5000 mg | SUBCUTANEOUS | 3 refills | Status: DC

## 2023-09-03 MED ORDER — ONETOUCH DELICA PLUS LANCING MISC: 1.0000 | Freq: Every day | 0 refills | Status: AC

## 2023-09-03 NOTE — Progress Notes (Signed)
 Name: [name]  MRN/ DOB: [id number], 02/22/1961   Age/ Sex: 63 y.o., female    PCP: Corwin Levins, MD   Reason for Endocrinology Evaluation: Type 2 Diabetes Mellitus     Date of Initial Endocrinology Visit: 04/15/2022    PATIENT IDENTIFIER: [name] is a 63 y.o. female with a past medical history of HTN, DM. The patient presented for initial endocrinology clinic visit on 04/15/2022 for consultative assistance with her diabetes management.    HPI: [name]  is here with her daughter    Diagnosed with DM 2019 with an A1c 2019           Hemoglobin A1c has ranged from 6.6% in 2019, peaking at 7.7% in 2023.  MULTINODULAR GOITER: She has been diagnosed with multinodular goiter based on thyroid ultrasound in 2021.  She is s/p benign FNA of the left inferior nodule in April 2021  She sees [facility] for estrogen management.   SUBJECTIVE:   During the last visit (03/05/2023): A1c 5.2 %    Today (09/03/23): [name] is here mainly to discuss her neck pain.  She also follow-up on diabetes management and multinodular goiter. She checks her blood sugars occasionally.   Patient follows with cardiology for CAD, HTN She continues to follow-up with the pain management clinic through [facility] She had a follow-up with podiatry 02/18/2023 through [facility]   Patient has been noted with continued weight loss She does have occasional nausea but no vomiting  Continues to take stool softeners for constipation  Denies local neck swelling  Denies palpitations  She does weight lifting at home and walking   HOME DIABETES REGIMEN: Tirzepatide 10 mg weekly     Statin: yes ACE-I/ARB: yes  METER DOWNLOAD SUMMARY: 2/11-3/05/2024 Average Number Tests/Day = 0 Overall Mean FS Glucose = 123 Standard Deviation = 61  BG Ranges: Low = 82 High = 284  BG Target % Results: % In target = 89 % Over target = 11 % Under target = 0  Hypoglycemic Events/30  Days: BG < 50 = 0 Episodes of symptomatic severe hypoglycemia = 0   DIABETIC COMPLICATIONS: Microvascular complications:  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  Denies: CKD Last eye exam: Completed  07/2022  Macrovascular complications:  CAD (patient has a calcium score of 108 indicating presence of plaque) Denies:  PVD, CVA   PAST HISTORY: Past Medical History:  Past Medical History:  Diagnosis Date   Anemia    ANEMIA-IRON DEFICIENCY 01/03/2007   Anxiety    ANXIETY 01/03/2007   Back pain    Complication of anesthesia    severe post-operative nausea except for bladder surgery in 2002 at [facility]-did not have complications then   Depression    DEPRESSION 01/03/2007   Diabetes (HCC)    DJD (degenerative joint disease)    knee   Edema of both lower extremities    Enlarged thyroid 10/18/2019   Family history of adverse reaction to anesthesia    mother and father both had severe post-operative nausea and vomiting   Fatty liver    Foot pain    GERD 01/16/2009   GERD (gastroesophageal reflux disease)    Headache(784.0) 01/03/2007   Hyperplastic colon polyp    04/2010   HYPERSOMNIA 04/04/2010   Hypertension    IBS 01/16/2009   IBS (irritable bowel syndrome)    Impaired glucose tolerance 05/18/2011   Knee pain    Menopause    early  Migraine    Obesity    OSTEOARTHRITIS, KNEES, 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 She has never used smokeless tobacco. She reports that she does not drink alcohol and does not use drugs. Family History:  Family History  Problem Relation Age of Onset   Hypertension Mother    Diabetes Brother       HOME MEDICATIONS: Allergies as of 09/03/2023       Reactions   Meperidine Shortness Of Breath   Morphine Anaphylaxis   Penicillins Hives, Nausea Only, Swelling, Other (See Comments), Anaphylaxis, Nausea And Vomiting   PATIENT HAS HAD A PCN REACTION WITH IMMEDIATE RASH, FACIAL/TONGUE/THROAT SWELLING, SOB, OR LIGHTHEADEDNESS WITH HYPOTENSION:  #  #  YES  #  #  HAS PT DEVELOPED SEVERE RASH INVOLVING MUCUS MEMBRANES or SKIN NECROSIS: #  #  YES  #  #  PATIENT HAS HAD A PCN REACTION THAT REQUIRED HOSPITALIZATION:  #  #  YES  #  #  Has patient had a PCN reaction occurring within the last 10 years: No vomiting PATIENT HAS HAD A PCN REACTION WITH IMMEDIATE RASH, FACIAL/TONGUE/THROAT SWELLING, SOB, OR LIGHTHEADEDNESS WITH HYPOTENSION:  #  #  YES  #  #  HAS PT DEVELOPED SEVERE RASH INVOLVING MUCUS MEMBRANES or SKIN NECROSIS: #  #  YES  #  #  PATIENT HAS HAD A PCN REACTION THAT REQUIRED HOSPITALIZATION:  #  #  YES  #  #  Has patient had a PCN reaction occurring within the last 10 years: No   Azithromycin Nausea And Vomiting, Nausea Only   Lisinopril Cough       Sucralfate Rash, Other (See Comments)   Blurry vision  Blurry vision    Doxycycline Nausea Only   nausea   Gabapentin Other (See Comments)   Muscle pain, fatigue   Other    Tyloxapol Other (See Comments)   Amoxicillin Nausea Only   Codeine Nausea Only, Other (See Comments)   Hot flashes also   Hydrocodone Itching, Nausea Only, Rash   Omeprazole Itching, Rash   Oxycodone Nausea Only, Rash, Nausea And Vomiting   Oxycodone-acetaminophen Nausea And Vomiting, Rash   Tramadol Itching, Nausea Only, Rash        Medication List        Accurate as of September 03, 2023  8:14 AM. If you have any questions, ask your nurse or doctor.          aspirin EC 81 MG tablet Take 81 mg by mouth daily. Swallow whole.   carisoprodol 350 MG tablet Commonly known as: SOMA Take 1 tablet by mouth every 6 (six) hours as needed.   cetirizine  10 MG tablet Commonly known as: ZYRTEC Take 1 tablet (10 mg total) by mouth daily.   clotrimazole-betamethasone cream Commonly known as: LOTRISONE APPLY TO AFFECTED AREA AND SURROUNDING AREAS OF SKIN TWICE A DAY IN MORNING AND EVENING FOR 2 WEEKS   cyclobenzaprine 10 MG tablet Commonly known as: FLEXERIL PLACE 1 TABLET PER VAGINA AT BEDTIME   esomeprazole 20 MG capsule Commonly known as: NEXIUM  Take 20 mg by mouth daily.   estradiol 1 MG tablet Commonly known as: ESTRACE Take 1 mg by mouth daily.   estradiol 0.5 MG tablet Commonly known as: ESTRACE Take 0.5 mg by mouth daily.   fluconazole 150 MG tablet Commonly known as: DIFLUCAN TAKE 1 TABLET BY MOUTH EVERY 3 DAYS AS NEEDED   gabapentin 100 MG capsule Commonly known as: NEURONTIN Take 2 capsules (200 mg total) by mouth 3 (three) times daily as needed.   hydrocortisone 2.5 % cream   ketoconazole 2 % cream Commonly known as: NIZORAL   losartan 25 MG tablet Commonly known as: COZAAR Take 1 tablet by mouth daily.   losartan 100 MG tablet Commonly known as: COZAAR Take 1 tablet (100 mg total) by mouth daily.   methocarbamol 500 MG tablet Commonly known as: ROBAXIN TAKE 1 TABLET BY MOUTH THREE TIMES A DAY FOR 10 DAYS   OneTouch Verio test strip Generic drug: glucose blood CHECK BLOOD SUGAR 1 TIME DAILY   oxyCODONE 5 MG immediate release tablet Commonly known as: Oxy IR/ROXICODONE TAKE 1-2 TABLETS BY MOUTH EVERY 4 HOURS AS NEEDED FOR SEVERE PAIN   promethazine 25 MG tablet Commonly known as: PHENERGAN Take 25 mg by mouth daily as needed for nausea or vomiting.   rosuvastatin 10 MG tablet Commonly known as: CRESTOR TAKE 1 TABLET BY MOUTH EVERY DAY   tirzepatide 10 MG/0.5ML Pen Commonly known as: MOUNJARO Inject 10 mg into the skin once a week.         ALLERGIES: Allergies  Allergen Reactions   Meperidine Shortness Of Breath   Morphine Anaphylaxis   Penicillins Hives, Nausea Only, Swelling,  Other (See Comments), Anaphylaxis and Nausea And Vomiting    PATIENT HAS HAD A PCN REACTION WITH IMMEDIATE RASH, FACIAL/TONGUE/THROAT SWELLING, SOB, OR LIGHTHEADEDNESS WITH HYPOTENSION:  #  #  YES  #  #  HAS PT DEVELOPED SEVERE RASH INVOLVING MUCUS MEMBRANES or SKIN NECROSIS: #  #  YES  #  #  PATIENT HAS HAD A PCN REACTION THAT REQUIRED HOSPITALIZATION:  #  #  YES  #  #  Has patient had a PCN reaction occurring within the last 10 years: No  vomiting PATIENT HAS HAD A PCN REACTION WITH IMMEDIATE RASH, FACIAL/TONGUE/THROAT SWELLING, SOB, OR LIGHTHEADEDNESS WITH HYPOTENSION:  #  #  YES  #  #  HAS PT DEVELOPED SEVERE RASH INVOLVING MUCUS MEMBRANES or SKIN NECROSIS: #  #  YES  #  #  PATIENT HAS HAD A PCN REACTION THAT REQUIRED HOSPITALIZATION:  #  #  YES  #  #  Has patient had a PCN reaction occurring within the last 10 years: No   Azithromycin Nausea And Vomiting and Nausea Only   Lisinopril Cough        Sucralfate Rash and Other (See Comments)    Blurry vision  Blurry vision     Doxycycline Nausea Only    nausea   Gabapentin Other (See Comments)    Muscle pain, fatigue   Other    Tyloxapol Other (See Comments)   Amoxicillin Nausea Only   Codeine Nausea Only and Other (See Comments)    Hot flashes also   Hydrocodone Itching, Nausea Only and Rash   Omeprazole Itching and Rash   Oxycodone Nausea Only, Rash and Nausea And Vomiting   Oxycodone-Acetaminophen Nausea And Vomiting and Rash   Tramadol Itching, Nausea Only and Rash     REVIEW OF SYSTEMS: A comprehensive ROS was conducted  with the patient and is negative except as per HPI    OBJECTIVE:   VITAL SIGNS: BP 120/70 (BP Location: Left Arm, Patient Position: Sitting, Cuff Size: Small)   Pulse 75   Ht 5\' 5"  (1.651 m)   Wt 179 lb (81.2 kg)   SpO2 99%   BMI 29.79 kg/m    PHYSICAL EXAM:  General: Pt appears well and is in NAD  Neck: General: Patient with tenderness over the lateral aspect of posterior neck, with tenderness  with flexion and right neck rotation Thyroid: Thyromegaly noted, no tenderness  Lungs: Clear with good BS bilat   Heart: RRR   Extremities:  Lower extremities - No pretibial edema  Neuro: MS is good with appropriate affect, pt is alert and Ox3    DM foot exam: 02/18/2023 podiatry   DATA REVIEWED:  Lab Results  Component Value Date   HGBA1C 5.3 06/05/2023   HGBA1C 5.2 03/05/2023   HGBA1C 5.2 12/04/2022         Latest Reference Range & Units 06/05/23 10:40  Sodium 135 - 145 mEq/L 141  Potassium 3.5 - 5.1 mEq/L 3.9  Chloride 96 - 112 mEq/L 107  CO2 19 - 32 mEq/L 26  Glucose 70 - 99 mg/dL 83  BUN 6 - 23 mg/dL 16  Creatinine 6.96 - 2.95 mg/dL 2.84  Calcium 8.4 - 13.2 mg/dL 9.4  Alkaline Phosphatase 39 - 117 U/L 48  Albumin 3.5 - 5.2 g/dL 4.4  AST 0 - 37 U/L 17  ALT 0 - 35 U/L 15  Total Protein 6.0 - 8.3 g/dL 7.3  Bilirubin, Direct 0.0 - 0.3 mg/dL 0.1  Total Bilirubin 0.2 - 1.2 mg/dL 0.4  GFR >44.01 mL/min 77.11  Total CHOL/HDL Ratio  2  Cholesterol 0 - 200 mg/dL 027  HDL Cholesterol >25.36 mg/dL 64.40  LDL (calc) 0 - 99 mg/dL 70  NonHDL  34.74  Triglycerides 0.0 - 149.0 mg/dL 25.9  VLDL 0.0 - 56.3 mg/dL 87.5     THYROID ULTRASOUND 09/18/2022  Estimated total number of nodules >/= 1 cm:   Number of spongiform nodules >/=  2 cm not described below (TR1): 0   Number of mixed cystic and solid nodules >/= 1.5 cm not described below (TR2): 0   _________________________________________________________   Nodule labeled 1 is a previously described spongiform TR 1 nodule in the superior right thyroid lobe measuring up to 1.5 cm, previously 1.4 cm. It remains unchanged. This nodule does NOT meet TI-RADS criteria for biopsy or dedicated follow-up.   Nodule labeled 2 is a small subcentimeter spongiform TR 1 nodule in the inferior right thyroid lobe. This nodule does NOT meet TI-RADS criteria for biopsy or dedicated follow-up.   Nodule labeled 3 in the mid left thyroid  lobe measures up to 1.3 cm and is a solid hypoechoic TR 4 nodule (independently measured on image 41). This nodule (previously labeled 6) had measured 1.4 cm on most recent comparison, and 1.4 cm in April 2021. It remains similar in size and morphology. *Given size (>/= 1 - 1.4 cm) and appearance, a follow-up ultrasound in 1 year should be considered based on TI-RADS criteria.   Nodule labeled 4 in the inferior left thyroid lobe was previously biopsied, measuring up to 1.8 cm on today's exam, previously reported 1.9 cm. This nodule was independently measured on image 49. There appears to remain similar in size and morphology.   IMPRESSION: 1. Enlarged multinodular thyroid gland. No new or enlarging thyroid nodules. 2.  Similar appearance of previously biopsied nodule labeled 4 (previously 7) in the inferior left thyroid lobe. Correlate with biopsy results. 3. Similar appearance of nodule labeled 3 (previously 6; 1.3 cm TR 4) in the mid left thyroid lobe. This nodule continues to meet criteria for follow-up ultrasound in 1 year. This exam marks 3 year stability. A total follow-up interval of 5 years is recommended.     FNA left inferior thyroid nodule 10/14/2019   Clinical History: None provided  Specimen Submitted:  A. THYROID,LEFT INFERIOR, FINE NEEDLE ASPIRATION:    FINAL MICROSCOPIC DIAGNOSIS:  - Consistent with benign follicular nodule (Bethesda category II)    Old records , labs and images have been reviewed.    ASSESSMENT / PLAN / RECOMMENDATIONS:   1) Type 2 Diabetes Mellitus, Optimally  controlled, With neuropathic  and macrovascular complications - Most recent A1c of 5.0%. Goal A1c < 7.0 %.    -Her A1c remains at goal -Patient encouraged to continue with lifestyle changes, following exercise and low-carb diet -Patient would like to increase on Mounjaro, a new prescription has been sent  MEDICATIONS: Increase Mounjaro 12.5 mg weekly  EDUCATION /  INSTRUCTIONS: BG monitoring instructions: Patient is instructed to check her blood sugars 2-3 times a week. Call [facility] if: BG persistently < 70  I reviewed the Rule of 15 for the treatment of hypoglycemia in detail with the patient. Literature supplied.   2) Diabetic complications:  Eye: Does not have known diabetic retinopathy.  Neuro/ Feet: Does have known diabetic peripheral neuropathy. Renal: Patient does not have known baseline CKD. She is  on an ACEI/ARB at present.  3) MULTINODULAR GOITER:  -No local neck symptoms -She is s/p benign FNA of the left thyroid nodule in 2021 -TSH was normal 11/2022 -We will proceed with repeat thyroid ultrasound  Follow-up in 6 months   Signed electronically by: Lyndle Herrlich, MD  [facility]  [facility] [address] [place] [place], [place] [address] Phone: [phone] FAX: [phone]   CC: Corwin Levins, MD [address] [place] [address] Phone: [phone]  Fax: [phone]    Return to Endocrinology clinic as below: Future Appointments  Date Time Provider Department Center  11/24/2023  9:00 AM Corwin Levins, MD [facility] None

## 2023-09-03 NOTE — Patient Instructions (Signed)
Increase Mounjaro 12.5 mg weekly.

## 2023-09-04 ENCOUNTER — Encounter: Payer: Self-pay | Admitting: Internal Medicine

## 2023-09-09 ENCOUNTER — Other Ambulatory Visit

## 2023-09-12 ENCOUNTER — Ambulatory Visit
Admission: RE | Admit: 2023-09-12 | Discharge: 2023-09-12 | Disposition: A | Discharge status: Home / Self Care | Source: Ambulatory Visit | Attending: Internal Medicine | Admitting: Internal Medicine

## 2023-09-12 DIAGNOSIS — E042 Nontoxic multinodular goiter: Secondary | ICD-10-CM

## 2023-09-15 ENCOUNTER — Encounter: Payer: Self-pay | Admitting: Internal Medicine

## 2023-09-26 ENCOUNTER — Encounter: Payer: Self-pay | Admitting: Internal Medicine

## 2023-09-26 ENCOUNTER — Ambulatory Visit: Admitting: Internal Medicine

## 2023-09-26 VITALS — BP 120/70 | HR 85 | Temp 98.6°F | Ht 65.0 in | Wt 179.0 lb

## 2023-09-26 DIAGNOSIS — E1165 Type 2 diabetes mellitus with hyperglycemia: Secondary | ICD-10-CM | POA: Diagnosis not present

## 2023-09-26 DIAGNOSIS — J309 Allergic rhinitis, unspecified: Secondary | ICD-10-CM

## 2023-09-26 DIAGNOSIS — Z7985 Long-term (current) use of injectable non-insulin antidiabetic drugs: Secondary | ICD-10-CM | POA: Diagnosis not present

## 2023-09-26 DIAGNOSIS — I10 Essential (primary) hypertension: Secondary | ICD-10-CM

## 2023-09-26 DIAGNOSIS — T7840XA Allergy, unspecified, initial encounter: Secondary | ICD-10-CM

## 2023-09-26 DIAGNOSIS — E559 Vitamin D deficiency, unspecified: Secondary | ICD-10-CM

## 2023-09-26 MED ORDER — CETIRIZINE HCL 10 MG PO TABS: 10.0000 mg | ORAL_TABLET | Freq: Every day | ORAL | 11 refills | Status: AC

## 2023-09-26 MED ORDER — METHYLPREDNISOLONE ACETATE 80 MG/ML IJ SUSP
80.0000 mg | Freq: Once | INTRAMUSCULAR | Status: AC
  Administered 2023-09-26: 80 mg via INTRAMUSCULAR

## 2023-09-26 MED ORDER — TRIAMCINOLONE ACETONIDE 55 MCG/ACT NA AERO: 2.0000 | INHALATION_SPRAY | Freq: Every day | NASAL | 12 refills | Status: AC

## 2023-09-26 MED ORDER — PREDNISONE 10 MG PO TABS: ORAL_TABLET | ORAL | 0 refills | Status: AC

## 2023-09-26 NOTE — Progress Notes (Signed)
 Patient ID: [name], female   DOB: [birth year], 63 y.o.   MRN: [id number]        Chief Complaint: follow up allergies, dm, htn, low vit d       HPI:  [name] is a 63 y.o. female here have several wks ongoing nasal allergy symptoms with clearish congestion, itch and sneezing, without fever, pain, ST, cough, swelling or wheezing.  Pt denies chest pain, increased sob or doe, wheezing, orthopnea, PND, increased LE swelling, palpitations, dizziness or syncope.   Pt denies polydipsia, polyuria, or new focal neuro s/s.          Wt Readings from Last 3 Encounters:  09/26/23 179 lb (81.2 kg)  09/03/23 179 lb (81.2 kg)  06/23/23 180 lb 3.2 oz (81.7 kg)   BP Readings from Last 3 Encounters:  09/26/23 120/70  09/03/23 120/70  06/23/23 106/68         Past Medical History:  Diagnosis Date   Anemia    ANEMIA-IRON DEFICIENCY 01/03/2007   Anxiety    ANXIETY 01/03/2007   Back pain    Complication of anesthesia    severe post-operative nausea except for bladder surgery in 2002 at [facility]-did not have complications then   Depression    DEPRESSION 01/03/2007   Diabetes (HCC)    DJD (degenerative joint disease)    knee   Edema of both lower extremities    Enlarged thyroid 10/18/2019   Family history of adverse reaction to anesthesia    mother and father both had severe post-operative nausea and vomiting   Fatty liver    Foot pain    GERD 01/16/2009   GERD (gastroesophageal reflux disease)    Headache(784.0) 01/03/2007   Hyperplastic colon polyp    04/2010   HYPERSOMNIA 04/04/2010   Hypertension    IBS 01/16/2009   IBS (irritable bowel syndrome)    Impaired glucose tolerance 05/18/2011   Knee pain    Menopause    early   Migraine    Obesity    OSTEOARTHRITIS, KNEES, 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 She has never used smokeless tobacco. She reports that she does not drink alcohol and does not use drugs. family history includes Diabetes in her brother; Hypertension in her mother. Allergies  Allergen Reactions   Meperidine Shortness Of Breath   Morphine Anaphylaxis   Penicillins Hives, Nausea Only, Swelling, Other (See Comments), Anaphylaxis and Nausea And Vomiting    PATIENT HAS HAD A PCN REACTION WITH IMMEDIATE RASH, FACIAL/TONGUE/THROAT SWELLING, SOB, OR LIGHTHEADEDNESS WITH HYPOTENSION:  #  #  YES  #  #  HAS PT DEVELOPED SEVERE RASH INVOLVING MUCUS MEMBRANES or SKIN NECROSIS: #  #  YES  #  #  PATIENT HAS HAD A PCN REACTION THAT REQUIRED HOSPITALIZATION:  #  #  YES  #  #  Has patient had a PCN reaction occurring within the last 10 years: No  vomiting PATIENT HAS HAD A PCN REACTION WITH IMMEDIATE RASH, FACIAL/TONGUE/THROAT SWELLING, SOB, OR LIGHTHEADEDNESS WITH HYPOTENSION:  #  #  YES  #  #  HAS PT  DEVELOPED SEVERE RASH INVOLVING MUCUS MEMBRANES or SKIN NECROSIS: #  #  YES  #  #  PATIENT HAS HAD A PCN REACTION THAT REQUIRED HOSPITALIZATION:  #  #  YES  #  #  Has patient had a PCN reaction occurring within the last 10 years: No   Azithromycin Nausea And Vomiting and Nausea Only   Lisinopril Cough        Sucralfate Rash and Other (See Comments)    Blurry vision  Blurry vision     Doxycycline Nausea Only    nausea   Gabapentin Other (See Comments)    Muscle pain, fatigue   Other    Tyloxapol Other (See Comments)   Amoxicillin Nausea Only   Codeine Nausea Only and Other (See Comments)    Hot flashes also   Hydrocodone Itching, Nausea Only and Rash   Omeprazole Itching and Rash   Oxycodone Nausea Only, Rash and Nausea And Vomiting   Oxycodone-Acetaminophen Nausea And Vomiting and Rash   Tramadol Itching, Nausea Only and Rash   Current  Outpatient Medications on File Prior to Visit  Medication Sig Dispense Refill   aspirin EC 81 MG tablet Take 81 mg by mouth daily. Swallow whole.     carisoprodol (SOMA) 350 MG tablet Take 1 tablet by mouth every 6 (six) hours as needed.     clotrimazole-betamethasone (LOTRISONE) cream APPLY TO AFFECTED AREA AND SURROUNDING AREAS OF SKIN TWICE A DAY IN MORNING AND EVENING FOR 2 WEEKS     cyclobenzaprine (FLEXERIL) 10 MG tablet PLACE 1 TABLET PER VAGINA AT BEDTIME     esomeprazole (NEXIUM) 20 MG capsule Take 20 mg by mouth daily.     estradiol (ESTRACE) 0.5 MG tablet Take 0.5 mg by mouth daily.     estradiol (ESTRACE) 1 MG tablet Take 1 mg by mouth daily.     fluconazole (DIFLUCAN) 150 MG tablet TAKE 1 TABLET BY MOUTH EVERY 3 DAYS AS NEEDED     gabapentin (NEURONTIN) 100 MG capsule Take 2 capsules (200 mg total) by mouth 3 (three) times daily as needed. 180 capsule 5   hydrocortisone 2.5 % cream      ketoconazole (NIZORAL) 2 % cream      Lancet Devices (ONETOUCH DELICA PLUS LANCING) MISC 1 Device by Does not apply route daily. 1 each 0   losartan (COZAAR) 100 MG tablet Take 1 tablet (100 mg total) by mouth daily. 90 tablet 3   losartan (COZAAR) 25 MG tablet Take 1 tablet by mouth daily.     methocarbamol (ROBAXIN) 500 MG tablet TAKE 1 TABLET BY MOUTH THREE TIMES A DAY FOR 10 DAYS     ONETOUCH VERIO test strip CHECK BLOOD SUGAR 1 TIME DAILY 50 strip 7   oxyCODONE (OXY IR/ROXICODONE) 5 MG immediate release tablet TAKE 1-2 TABLETS BY MOUTH EVERY 4 HOURS AS NEEDED FOR SEVERE PAIN     promethazine (PHENERGAN) 25 MG tablet Take 25 mg by mouth daily as needed for nausea or vomiting.     rosuvastatin (CRESTOR) 10 MG tablet TAKE 1 TABLET BY MOUTH EVERY DAY 30 tablet 11   tirzepatide (MOUNJARO) 12.5 MG/0.5ML Pen Inject 12.5 mg into the skin once a week. 6 mL 3   No current facility-administered medications on file prior to visit.        ROS:  All others reviewed and negative.  Objective        PE:   BP 120/70 (BP Location: Right Arm, Patient Position: Sitting, Cuff  Size: Normal)   Pulse 85   Temp 98.6 F (37 C) (Oral)   Ht 5\' 5"  (1.651 m)   Wt 179 lb (81.2 kg)   SpO2 98%   BMI 29.79 kg/m                 Constitutional: Pt appears in NAD               HENT: Head: NCAT.                Right Ear: External ear normal.                 Left Ear: External ear normal. Bilat tm's with mild erythema.  Max sinus areas non tender.  Pharynx with mild erythema, no exudate               Eyes: . Pupils are equal, round, and reactive to light. Conjunctivae and EOM are normal               Nose: without d/c or deformity               Neck: Neck supple. Gross normal ROM               Cardiovascular: Normal rate and regular rhythm.                 Pulmonary/Chest: Effort normal and breath sounds without rales or wheezing.                Abd:  Soft, NT, ND, + BS, no organomegaly               Neurological: Pt is alert. At baseline orientation, motor grossly intact               Skin: Skin is warm. No rashes, no other new lesions, LE edema - none               Psychiatric: Pt behavior is normal without agitation   Micro: none  Cardiac tracings I have personally interpreted today:  none  Pertinent Radiological findings (summarize): none   Lab Results  Component Value Date   WBC 6.1 12/04/2022   HGB 14.0 12/04/2022   HCT 43.0 12/04/2022   PLT 172.0 12/04/2022   GLUCOSE 83 06/05/2023   CHOL 145 06/05/2023   TRIG 83.0 06/05/2023   HDL 58.60 06/05/2023   LDLDIRECT 124.0 05/26/2020   LDLCALC 70 06/05/2023   ALT 15 06/05/2023   AST 17 06/05/2023   NA 141 06/05/2023   K 3.9 06/05/2023   CL 107 06/05/2023   CREATININE 0.81 06/05/2023   BUN 16 06/05/2023   CO2 26 06/05/2023   TSH 1.01 12/04/2022   HGBA1C 5.0 09/03/2023   MICROALBUR 0.8 09/03/2023   Assessment/Plan:  [name] is a 63 y.o. White or Caucasian [1] female with  has a past medical history of Anemia, ANEMIA-IRON  DEFICIENCY (01/03/2007), Anxiety, ANXIETY (01/03/2007), Back pain, Complication of anesthesia, Depression, DEPRESSION (01/03/2007), Diabetes (HCC), DJD (degenerative joint disease), Edema of both lower extremities, Enlarged thyroid (10/18/2019), Family history of adverse reaction to anesthesia, Fatty liver, Foot pain, GERD (01/16/2009), GERD (gastroesophageal reflux disease), Headache(784.0) (01/03/2007), Hyperplastic colon polyp, HYPERSOMNIA (04/04/2010), Hypertension, IBS (01/16/2009), IBS (irritable bowel syndrome), Impaired glucose tolerance (05/18/2011), Knee pain, Menopause, Migraine, Obesity, OSTEOARTHRITIS, KNEES, 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  Vitamin D deficiency Last vitamin D Lab Results  Component Value Date  VD25OH 37.87 06/05/2023   Low, to start oral replacement   Hypertension, uncontrolled BP Readings from Last 3 Encounters:  09/26/23 120/70  09/03/23 120/70  06/23/23 106/68   Stable, pt to continue medical treatment losartan 100 qd   Type 2 diabetes mellitus (HCC) Lab Results  Component Value Date   HGBA1C 5.0 09/03/2023   Stable, pt to continue current medical treatment mounjaro 12.5 mg weekly   Allergic rhinitis Mild to mod, for depomedrol IM 80 mg, prednisone taper, zyrtec and nasacort asd,  to f/u any worsening symptoms or concerns  Followup: Return if symptoms worsen or fail to improve.  Oliver Barre, MD 09/28/2023 11:58 AM [facility] - [facility]

## 2023-09-26 NOTE — Patient Instructions (Signed)
 You had the steroid shot today  Please take all new medication as prescribed - the prednisone, zyrtec and nasacort  Please continue all other medications as before, and refills have been done if requested.  Please have the pharmacy call with any other refills you may need.  Please keep your appointments with your specialists as you may have planned

## 2023-09-28 ENCOUNTER — Encounter: Payer: Self-pay | Admitting: Internal Medicine

## 2023-09-28 NOTE — Assessment & Plan Note (Signed)
 Last vitamin D Lab Results  Component Value Date   VD25OH 37.87 06/05/2023   Low, to start oral replacement

## 2023-09-28 NOTE — Assessment & Plan Note (Addendum)
 BP Readings from Last 3 Encounters:  09/26/23 120/70  09/03/23 120/70  06/23/23 106/68   Stable, pt to continue medical treatment losartan 100 qd

## 2023-09-28 NOTE — Assessment & Plan Note (Signed)
 Lab Results  Component Value Date   HGBA1C 5.0 09/03/2023   Stable, pt to continue current medical treatment mounjaro 12.5 mg weekly

## 2023-09-28 NOTE — Assessment & Plan Note (Signed)
 Mild to mod, for depomedrol IM 80 mg, prednisone taper, zyrtec and nasacort asd,  to f/u any worsening symptoms or concerns

## 2023-11-10 ENCOUNTER — Telehealth: Payer: Self-pay | Admitting: Internal Medicine

## 2023-11-10 MED ORDER — FLUCONAZOLE 150 MG PO TABS: ORAL_TABLET | ORAL | 1 refills | Status: AC

## 2023-11-10 NOTE — Telephone Encounter (Signed)
 Copied from CRM [id number]. Topic: Clinical - Medical Advice >> Nov 10, 2023  8:21 AM Albertha Alosa wrote: Reason for CRM: Patient called in stating she would like for Dr.John or nurses to call her something in for her yeast infection

## 2023-11-10 NOTE — Telephone Encounter (Signed)
 Ok this is done - diflucan 

## 2023-11-12 DIAGNOSIS — R1013 Epigastric pain: Secondary | ICD-10-CM | POA: Insufficient documentation

## 2023-11-24 ENCOUNTER — Ambulatory Visit: Payer: 59 | Admitting: Internal Medicine

## 2023-11-24 ENCOUNTER — Encounter: Payer: Self-pay | Admitting: Internal Medicine

## 2023-11-24 ENCOUNTER — Ambulatory Visit: Payer: Self-pay | Admitting: Internal Medicine

## 2023-11-24 VITALS — BP 120/78 | HR 70 | Temp 98.0°F | Ht 65.0 in | Wt 175.0 lb

## 2023-11-24 DIAGNOSIS — Z7985 Long-term (current) use of injectable non-insulin antidiabetic drugs: Secondary | ICD-10-CM | POA: Diagnosis not present

## 2023-11-24 DIAGNOSIS — E1165 Type 2 diabetes mellitus with hyperglycemia: Secondary | ICD-10-CM | POA: Diagnosis not present

## 2023-11-24 DIAGNOSIS — D509 Iron deficiency anemia, unspecified: Secondary | ICD-10-CM

## 2023-11-24 DIAGNOSIS — D2370 Other benign neoplasm of skin of unspecified lower limb, including hip: Secondary | ICD-10-CM | POA: Insufficient documentation

## 2023-11-24 DIAGNOSIS — Z0001 Encounter for general adult medical examination with abnormal findings: Secondary | ICD-10-CM | POA: Diagnosis not present

## 2023-11-24 DIAGNOSIS — E538 Deficiency of other specified B group vitamins: Secondary | ICD-10-CM | POA: Diagnosis not present

## 2023-11-24 DIAGNOSIS — L814 Other melanin hyperpigmentation: Secondary | ICD-10-CM | POA: Insufficient documentation

## 2023-11-24 DIAGNOSIS — E785 Hyperlipidemia, unspecified: Secondary | ICD-10-CM | POA: Diagnosis not present

## 2023-11-24 DIAGNOSIS — L723 Sebaceous cyst: Secondary | ICD-10-CM | POA: Insufficient documentation

## 2023-11-24 DIAGNOSIS — I10 Essential (primary) hypertension: Secondary | ICD-10-CM

## 2023-11-24 DIAGNOSIS — H9202 Otalgia, left ear: Secondary | ICD-10-CM | POA: Diagnosis not present

## 2023-11-24 DIAGNOSIS — D214 Benign neoplasm of connective and other soft tissue of abdomen: Secondary | ICD-10-CM | POA: Insufficient documentation

## 2023-11-24 DIAGNOSIS — D1800 Hemangioma unspecified site: Secondary | ICD-10-CM | POA: Insufficient documentation

## 2023-11-24 DIAGNOSIS — Z87898 Personal history of other specified conditions: Secondary | ICD-10-CM | POA: Insufficient documentation

## 2023-11-24 DIAGNOSIS — D225 Melanocytic nevi of trunk: Secondary | ICD-10-CM | POA: Insufficient documentation

## 2023-11-24 DIAGNOSIS — E559 Vitamin D deficiency, unspecified: Secondary | ICD-10-CM

## 2023-11-24 DIAGNOSIS — D485 Neoplasm of uncertain behavior of skin: Secondary | ICD-10-CM | POA: Insufficient documentation

## 2023-11-24 DIAGNOSIS — L821 Other seborrheic keratosis: Secondary | ICD-10-CM | POA: Insufficient documentation

## 2023-11-24 LAB — BASIC METABOLIC PANEL WITH GFR
BUN: 18 mg/dL (ref 6–23)
CO2: 24 meq/L (ref 19–32)
Calcium: 9.6 mg/dL (ref 8.4–10.5)
Chloride: 105 meq/L (ref 96–112)
Creatinine, Ser: 0.76 mg/dL (ref 0.40–1.20)
GFR: 82.96 mL/min (ref >60.00)
Glucose, Bld: 82 mg/dL (ref 70–99)
Potassium: 3.6 meq/L (ref 3.5–5.1)
Sodium: 138 meq/L (ref 135–145)

## 2023-11-24 LAB — HEPATIC FUNCTION PANEL
ALT: 12 U/L (ref 0–35)
AST: 18 U/L (ref 0–37)
Albumin: 4.3 g/dL (ref 3.5–5.2)
Alkaline Phosphatase: 40 U/L (ref 39–117)
Bilirubin, Direct: 0 mg/dL (ref 0.0–0.3)
Total Bilirubin: 0.4 mg/dL (ref 0.2–1.2)
Total Protein: 7.3 g/dL (ref 6.0–8.3)

## 2023-11-24 LAB — VITAMIN B12: Vitamin B-12: 519 pg/mL (ref 211–911)

## 2023-11-24 LAB — LIPID PANEL
Cholesterol: 136 mg/dL (ref 0–200)
HDL: 50.3 mg/dL (ref >39.00)
LDL Cholesterol: 59 mg/dL (ref 0–99)
NonHDL: 85.93
Total CHOL/HDL Ratio: 3
Triglycerides: 133 mg/dL (ref 0.0–149.0)
VLDL: 26.6 mg/dL (ref 0.0–40.0)

## 2023-11-24 LAB — CBC WITH DIFFERENTIAL/PLATELET
Basophils Absolute: 0.1 10*3/uL (ref 0.0–0.1)
Basophils Relative: 1.1 % (ref 0.0–3.0)
Eosinophils Absolute: 0.3 10*3/uL (ref 0.0–0.7)
Eosinophils Relative: 5.6 % — ABNORMAL HIGH (ref 0.0–5.0)
HCT: 42.8 % (ref 36.0–46.0)
Hemoglobin: 14.5 g/dL (ref 12.0–15.0)
Lymphocytes Relative: 38 % (ref 12.0–46.0)
Lymphs Abs: 2.1 10*3/uL (ref 0.7–4.0)
MCHC: 33.9 g/dL (ref 30.0–36.0)
MCV: 87.8 fl (ref 78.0–100.0)
Monocytes Absolute: 0.4 10*3/uL (ref 0.1–1.0)
Monocytes Relative: 6.9 % (ref 3.0–12.0)
Neutro Abs: 2.7 10*3/uL (ref 1.4–7.7)
Neutrophils Relative %: 48.4 % (ref 43.0–77.0)
Platelets: 196 10*3/uL (ref 150.0–400.0)
RBC: 4.87 Mil/uL (ref 3.87–5.11)
RDW: 12.7 % (ref 11.5–15.5)
WBC: 5.5 10*3/uL (ref 4.0–10.5)

## 2023-11-24 LAB — URINALYSIS, ROUTINE W REFLEX MICROSCOPIC
Bilirubin Urine: NEGATIVE
Hgb urine dipstick: NEGATIVE
Ketones, ur: NEGATIVE
Leukocytes,Ua: NEGATIVE
Nitrite: NEGATIVE
RBC / HPF: NONE SEEN (ref 0–?)
Specific Gravity, Urine: 1.02 (ref 1.000–1.030)
Total Protein, Urine: NEGATIVE
Urine Glucose: NEGATIVE
Urobilinogen, UA: 0.2 (ref 0.0–1.0)
pH: 6 (ref 5.0–8.0)

## 2023-11-24 LAB — VITAMIN D 25 HYDROXY (VIT D DEFICIENCY, FRACTURES): VITD: 44.78 ng/mL (ref 30.00–100.00)

## 2023-11-24 LAB — TSH: TSH: 0.84 u[IU]/mL (ref 0.35–5.50)

## 2023-11-24 NOTE — Progress Notes (Signed)
 Patient ID: [name], female   DOB: [birth year], 63 y.o.   MRN: [id number]         Chief Complaint:: wellness exam and left ear pain, dm, iron def anemia, htn, hld, low vit d       HPI:  [name] is a 63 y.o. female here for wellness exam; up to date                        Also has recurring right ear pain for unclear reason, asks for ENT referral.  No fever or worsening hearing loss.  Pt denies chest pain, increased sob or doe, wheezing, orthopnea, PND, increased LE swelling, palpitations, dizziness or syncope.   Pt denies polydipsia, polyuria, or new focal neuro s/s.    Pt denies fever, wt loss, night sweats, loss of appetite, or other constitutional symptoms    Wt Readings from Last 3 Encounters:  11/24/23 175 lb (79.4 kg)  09/26/23 179 lb (81.2 kg)  09/03/23 179 lb (81.2 kg)   BP Readings from Last 3 Encounters:  11/24/23 120/78  09/26/23 120/70  09/03/23 120/70   Immunization History  Administered Date(s) Administered   Influenza Split 03/28/2011, 04/02/2012   Influenza Whole 04/04/2010   Influenza, Seasonal, Injecte, Preservative Fre 03/04/2023   Influenza,inj,Quad PF,6+ Mos 03/19/2013, 04/01/2014, 04/01/2014, 03/24/2015, 03/05/2017, 04/09/2018, 03/31/2019, 03/10/2020, 03/08/2021, 03/01/2022   Influenza-Unspecified 03/18/2016   Moderna Sars-Covid-2 Vaccination 09/02/2019, 10/05/2019   Pneumococcal Conjugate-13 05/25/2019   Pneumococcal Polysaccharide-23 05/31/2020   Td 01/16/2009   Tdap 03/05/2017   Zoster Recombinant(Shingrix) 05/31/2020, 08/10/2020   There are no preventive care reminders to display for this patient.     Past Medical History:  Diagnosis Date   Anemia    ANEMIA-IRON DEFICIENCY 01/03/2007   Anxiety    ANXIETY 01/03/2007   Back pain    Complication of anesthesia    severe post-operative nausea except for bladder surgery in 2002 at [facility]-did not have complications then   Depression    DEPRESSION 01/03/2007   Diabetes (HCC)     DJD (degenerative joint disease)    knee   Edema of both lower extremities    Enlarged thyroid  10/18/2019   Family history of adverse reaction to anesthesia    mother and father both had severe post-operative nausea and vomiting   Fatty liver    Foot pain    GERD 01/16/2009   GERD (gastroesophageal reflux disease)    Headache(784.0) 01/03/2007   Hyperplastic colon polyp    04/2010   HYPERSOMNIA 04/04/2010   Hypertension    IBS 01/16/2009   IBS (irritable bowel syndrome)    Impaired glucose tolerance 05/18/2011   Knee pain    Menopause    early   Migraine    Obesity    OSTEOARTHRITIS, KNEES, 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 She has never used smokeless tobacco. She reports that she does not drink alcohol and does not use drugs. family history includes Diabetes in her brother; Hypertension in her mother. Allergies  Allergen Reactions   Meperidine Shortness Of Breath   Morphine  Anaphylaxis   Oxycodone -Acetaminophen  Dermatitis and Other (See Comments)    acetaminophen  / oxycodone    Penicillins Hives, Nausea Only, Swelling, Other (See Comments), Anaphylaxis and Nausea And Vomiting    PATIENT HAS HAD A PCN REACTION WITH IMMEDIATE RASH, FACIAL/TONGUE/THROAT SWELLING, SOB, OR LIGHTHEADEDNESS WITH HYPOTENSION:  #  #  YES  #  #  HAS PT DEVELOPED SEVERE RASH INVOLVING MUCUS MEMBRANES or SKIN NECROSIS: #  #  YES  #  #  PATIENT HAS HAD A PCN REACTION THAT REQUIRED HOSPITALIZATION:  #  #  YES  #  #  Has patient had a PCN reaction occurring within the last 10 years: No  vomiting PATIENT HAS HAD A PCN REACTION WITH IMMEDIATE RASH, FACIAL/TONGUE/THROAT SWELLING, SOB, OR LIGHTHEADEDNESS WITH  HYPOTENSION:  #  #  YES  #  #  HAS PT DEVELOPED SEVERE RASH INVOLVING MUCUS MEMBRANES or SKIN NECROSIS: #  #  YES  #  #  PATIENT HAS HAD A PCN REACTION THAT REQUIRED HOSPITALIZATION:  #  #  YES  #  #  Has patient had a PCN reaction occurring within the last 10 years: No   Azithromycin  Nausea And Vomiting and Nausea Only   Lisinopril  Cough        Sucralfate  Rash and Other (See Comments)    Blurry vision  Blurry vision     Doxycycline  Nausea Only    nausea   Gabapentin  Other (See Comments)    Muscle pain, fatigue   Other    Tyloxapol Other (See Comments)   Amoxicillin Nausea Only   Codeine Nausea Only and Other (See Comments)    Hot flashes also   Hydrocodone  Itching, Nausea Only and Rash   Omeprazole Itching and Rash   Oxycodone  Nausea Only, Rash and Nausea And Vomiting   Oxycodone -Acetaminophen  Nausea And Vomiting and Rash   Tramadol Itching, Nausea Only and Rash   Current Outpatient Medications on File Prior to Visit  Medication Sig Dispense Refill   aspirin EC 81 MG tablet Take 81 mg by mouth daily. Swallow whole.     carisoprodol  (SOMA ) 350 MG tablet Take 1 tablet by mouth every 6 (six) hours as needed.     cetirizine  (ZYRTEC ) 10 MG tablet Take 1 tablet (10 mg total) by mouth daily. 30 tablet 11   clotrimazole-betamethasone  (LOTRISONE) cream APPLY TO AFFECTED AREA AND SURROUNDING AREAS OF SKIN TWICE A DAY IN MORNING AND EVENING FOR 2 WEEKS     cyclobenzaprine  (FLEXERIL ) 10 MG tablet PLACE 1 TABLET PER VAGINA AT BEDTIME     econazole nitrate 1 % cream Apply topically.     esomeprazole (NEXIUM) 20 MG capsule Take 40 mg by mouth daily.     estradiol  (ESTRACE ) 0.5 MG tablet Take 0.5 mg by mouth daily.     estradiol  (ESTRACE ) 1 MG tablet Take 1 mg by mouth daily.     fluconazole  (DIFLUCAN ) 150 MG tablet TAKE 1 TABLET BY MOUTH EVERY 3 DAYS AS NEEDED 2 tablet 1   fluorouracil (EFUDEX) 5 % cream 1 Application.     gabapentin  (NEURONTIN ) 100 MG capsule Take 2 capsules (200 mg total)  by mouth 3 (three) times daily as needed. 180 capsule 5   hydrocortisone 2.5 % cream      hydroquinone 4 % cream 1 Application.     ketoconazole  (NIZORAL ) 2 % cream      Lancet Devices (ONETOUCH DELICA PLUS LANCING) MISC 1 Device by Does not apply route daily. 1 each 0  losartan  (COZAAR ) 100 MG tablet Take 1 tablet (100 mg total) by mouth daily. 90 tablet 3   losartan  (COZAAR ) 25 MG tablet Take 1 tablet by mouth daily.     methocarbamol (ROBAXIN) 500 MG tablet TAKE 1 TABLET BY MOUTH THREE TIMES A DAY FOR 10 DAYS     nystatin  cream (MYCOSTATIN ) Apply topically.     nystatin -triamcinolone  (MYCOLOG II) cream SMARTSIG:Topical Morning-Evening     ONETOUCH VERIO test strip CHECK BLOOD SUGAR 1 TIME DAILY 50 strip 7   oxyCODONE  (OXY IR/ROXICODONE ) 5 MG immediate release tablet TAKE 1-2 TABLETS BY MOUTH EVERY 4 HOURS AS NEEDED FOR SEVERE PAIN     predniSONE  (DELTASONE ) 10 MG tablet 3 tabs by mouth per day for 3 days,2tabs per day for 3 days,1tab per day for 3 days 18 tablet 0   Probiotic Product (PROMELLA IN PREBIOTIC) CAPS Take 1 capsule by mouth daily.     promethazine  (PHENERGAN ) 25 MG tablet Take 25 mg by mouth daily as needed for nausea or vomiting.     rosuvastatin  (CRESTOR ) 10 MG tablet TAKE 1 TABLET BY MOUTH EVERY DAY 30 tablet 11   tirzepatide  (MOUNJARO ) 12.5 MG/0.5ML Pen Inject 12.5 mg into the skin once a week. 6 mL 3   traZODone  (DESYREL ) 50 MG tablet Take 50 mg by mouth.     triamcinolone  (NASACORT ) 55 MCG/ACT AERO nasal inhaler Place 2 sprays into the nose daily. 1 each 12   No current facility-administered medications on file prior to visit.        ROS:  All others reviewed and negative.  Objective        PE:  BP 120/78 (BP Location: Right Arm, Patient Position: Sitting, Cuff Size: Normal)   Pulse 70   Temp 98 F (36.7 C) (Oral)   Ht 5\' 5"  (1.651 m)   Wt 175 lb (79.4 kg)   SpO2 99%   BMI 29.12 kg/m                 Constitutional: Pt appears in NAD               HENT: Head:  NCAT.                Right Ear: External ear normal.                 Left Ear: External ear normal.                Eyes: . Pupils are equal, round, and reactive to light. Conjunctivae and EOM are normal               Nose: without d/c or deformity               Neck: Neck supple. Gross normal ROM               Cardiovascular: Normal rate and regular rhythm.                 Pulmonary/Chest: Effort normal and breath sounds without rales or wheezing.                Abd:  Soft, NT, ND, + BS, no organomegaly               Neurological: Pt is alert. At baseline orientation, motor grossly intact               Skin: Skin is warm. No rashes, no other new lesions, LE edema -  none               Psychiatric: Pt behavior is normal without agitation   Micro: none  Cardiac tracings I have personally interpreted today:  none  Pertinent Radiological findings (summarize): none   Lab Results  Component Value Date   WBC 5.5 11/24/2023   HGB 14.5 11/24/2023   HCT 42.8 11/24/2023   PLT 196.0 11/24/2023   GLUCOSE 82 11/24/2023   CHOL 136 11/24/2023   TRIG 133.0 11/24/2023   HDL 50.30 11/24/2023   LDLDIRECT 124.0 05/26/2020   LDLCALC 59 11/24/2023   ALT 12 11/24/2023   AST 18 11/24/2023   NA 138 11/24/2023   K 3.6 11/24/2023   CL 105 11/24/2023   CREATININE 0.76 11/24/2023   BUN 18 11/24/2023   CO2 24 11/24/2023   TSH 0.84 11/24/2023   HGBA1C 5.0 09/03/2023   MICROALBUR 0.8 09/03/2023   Assessment/Plan:  [name] is a 63 y.o. White or Caucasian [1] female with  has a past medical history of Anemia, ANEMIA-IRON DEFICIENCY (01/03/2007), Anxiety, ANXIETY (01/03/2007), Back pain, Complication of anesthesia, Depression, DEPRESSION (01/03/2007), Diabetes (HCC), DJD (degenerative joint disease), Edema of both lower extremities, Enlarged thyroid  (10/18/2019), Family history of adverse reaction to anesthesia, Fatty liver, Foot pain, GERD (01/16/2009), GERD (gastroesophageal reflux disease),  Headache(784.0) (01/03/2007), Hyperplastic colon polyp, HYPERSOMNIA (04/04/2010), Hypertension, IBS (01/16/2009), IBS (irritable bowel syndrome), Impaired glucose tolerance (05/18/2011), Knee pain, Menopause, Migraine, Obesity, OSTEOARTHRITIS, KNEES, 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  Encounter for well adult exam with abnormal findings Age and sex appropriate education and counseling updated with regular exercise and diet Referrals for preventative services - none needed Immunizations addressed - none needed Smoking counseling  - none needed Evidence for depression or other mood disorder - none significant Most recent labs reviewed. I have personally reviewed and have noted: 1) the patient's medical and social history 2) The patient's current medications and supplements 3) The patient's height, weight, and BMI have been recorded in the chart   ANEMIA-IRON DEFICIENCY No overt bleeding, for f/u iron level  Hypertension, uncontrolled BP Readings from Last 3 Encounters:  11/24/23 120/78  09/26/23 120/70  09/03/23 120/70   Stable, pt to continue medical treatment losartan  100 mg qd   Type 2 diabetes mellitus (HCC) Lab Results  Component Value Date   HGBA1C 5.0 09/03/2023   Stable, pt to continue current medical treatment mounjaro  12.5 mg weekly   Dyslipidemia Lab Results  Component Value Date   LDLCALC 59 11/24/2023   Stable, pt to continue current statin crestor  10 mg qd   Vitamin D  deficiency Last vitamin D  Lab Results  Component Value Date   VD25OH 44.78 11/24/2023   Stable, cont oral replacement   B12 deficiency Lab Results  Component Value Date   VITAMINB12 519 11/24/2023   Stable, cont oral replacement - b12 1000 mcg qd   Left ear pain Recurrent, etiology unclear - for ENT referral per pt request  Followup: Return in about 6 months (around 05/25/2024).  Rosalia Colonel, MD  11/25/2023 12:53 PM [facility] - [facility]

## 2023-11-24 NOTE — Patient Instructions (Signed)
 Please continue all other medications as before, and refills have been done if requested.  Please have the pharmacy call with any other refills you may need.  Please continue your efforts at being more active, low cholesterol diet, and weight control.  You are otherwise up to date with prevention measures today.  Please keep your appointments with your specialists as you may have planned  - Endo in august  You will be contacted regarding the referral for: ENT  Please go to the LAB at the blood drawing area for the tests to be done  You will be contacted by phone if any changes need to be made immediately.  Otherwise, you will receive a letter about your results with an explanation, but please check with MyChart first.  Please make an Appointment to return in 6 months, or sooner if needed

## 2023-11-24 NOTE — Progress Notes (Signed)
 The test results show that your current treatment is OK, as the tests are stable.  Please continue the same plan.  There is no other need for change of treatment or further evaluation based on these results, at this time.  thanks

## 2023-11-25 ENCOUNTER — Encounter: Payer: Self-pay | Admitting: Internal Medicine

## 2023-11-25 DIAGNOSIS — H9202 Otalgia, left ear: Secondary | ICD-10-CM | POA: Insufficient documentation

## 2023-11-25 NOTE — Assessment & Plan Note (Signed)
 No overt bleeding, for f/u iron level

## 2023-11-25 NOTE — Assessment & Plan Note (Signed)
 Lab Results  Component Value Date   HGBA1C 5.0 09/03/2023   Stable, pt to continue current medical treatment mounjaro 12.5 mg weekly

## 2023-11-25 NOTE — Assessment & Plan Note (Signed)
 BP Readings from Last 3 Encounters:  11/24/23 120/78  09/26/23 120/70  09/03/23 120/70   Stable, pt to continue medical treatment losartan  100 mg qd

## 2023-11-25 NOTE — Assessment & Plan Note (Signed)
 Lab Results  Component Value Date   LDLCALC 59 11/24/2023   Stable, pt to continue current statin crestor  10 mg qd

## 2023-11-25 NOTE — Assessment & Plan Note (Signed)
 Lab Results  Component Value Date   VITAMINB12 519 11/24/2023   Stable, cont oral replacement - b12 1000 mcg qd

## 2023-11-25 NOTE — Assessment & Plan Note (Signed)
 Recurrent, etiology unclear - for ENT referral per pt request

## 2023-11-25 NOTE — Assessment & Plan Note (Signed)
 Last vitamin D  Lab Results  Component Value Date   VD25OH 44.78 11/24/2023   Stable, cont oral replacement

## 2023-11-25 NOTE — Assessment & Plan Note (Signed)

## 2023-11-26 ENCOUNTER — Encounter (INDEPENDENT_AMBULATORY_CARE_PROVIDER_SITE_OTHER): Payer: Self-pay | Admitting: Otolaryngology

## 2024-01-05 ENCOUNTER — Ambulatory Visit (INDEPENDENT_AMBULATORY_CARE_PROVIDER_SITE_OTHER): Admitting: Audiology

## 2024-01-05 ENCOUNTER — Encounter (INDEPENDENT_AMBULATORY_CARE_PROVIDER_SITE_OTHER): Payer: Self-pay | Admitting: Physician Assistant

## 2024-01-05 ENCOUNTER — Ambulatory Visit (INDEPENDENT_AMBULATORY_CARE_PROVIDER_SITE_OTHER): Admitting: Physician Assistant

## 2024-01-05 VITALS — BP 125/80 | HR 74

## 2024-01-05 DIAGNOSIS — Z09 Encounter for follow-up examination after completed treatment for conditions other than malignant neoplasm: Secondary | ICD-10-CM | POA: Diagnosis not present

## 2024-01-05 DIAGNOSIS — Z0110 Encounter for examination of ears and hearing without abnormal findings: Secondary | ICD-10-CM

## 2024-01-05 DIAGNOSIS — Z8669 Personal history of other diseases of the nervous system and sense organs: Secondary | ICD-10-CM

## 2024-01-05 DIAGNOSIS — J302 Other seasonal allergic rhinitis: Secondary | ICD-10-CM

## 2024-01-05 MED ORDER — FLUTICASONE PROPIONATE 50 MCG/ACT NA SUSP: 2.0000 | Freq: Every day | NASAL | 6 refills | Status: AC

## 2024-01-05 NOTE — Progress Notes (Signed)
 Dear Dr. Norleen, Here is my assessment for our mutual [name] [name], [name] [name] [name]. Thank you for allowing me [name] opportunity to care for your [name] [name]. Please do not hesitate to contact me should you have any other questions. Sincerely, Chyrl Cohen PA-C  Otolaryngology Clinic Note Referring provider: Dr. Norleen HPI:  [name] [name] [name] is a 63 y.o. female kindly referred by Dr. Norleen   [name] [name] [name] is a 63 year old female seen in our office for evaluation of left ear discomfort.  [name] [name] [name] notes that she has had episodes of fullness in [name] left ear, she notes some swelling surrounding [name] ear.  She notes some popping in her nose when blowing her nose.  She reports that her symptoms are aggravated by allergies.  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  She notes that after being outside she has significant nasal congestion and fullness in [name] left ear, she gets occasional swelling around [name] left ear as well.  She notes that when she is not around these trees symptoms improved.  She notes she takes Xyzal but has not seen an allergist previously.  She denies any history of recurrent otitis media but does note history of otitis externa.  She also notes a history of sinus infections reporting approximately 2 a year.  Today symptoms are mild.  No changes to her hearing.   Independent Review of Additional Tests or Records:  Audiological evaluation on 01/05/2024-normal audiological evaluation     PMH/Meds/All/SocHx/FamHx/ROS:   Past Medical History:  Diagnosis Date   Anemia    ANEMIA-IRON DEFICIENCY 01/03/2007   Anxiety    ANXIETY 01/03/2007   Back pain    Complication of anesthesia    severe post-operative nausea except for bladder surgery in 2002 at [facility]-did not have complications then   Depression    DEPRESSION 01/03/2007   Diabetes (HCC)    DJD (degenerative joint disease)    knee   Edema of both lower extremities    Enlarged thyroid  10/18/2019   Family  history of adverse reaction to anesthesia    mother and father both had severe post-operative nausea and vomiting   Fatty liver    Foot pain    GERD 01/16/2009   GERD (gastroesophageal reflux disease)    Headache(784.0) 01/03/2007   Hyperplastic colon polyp    04/2010   HYPERSOMNIA 04/04/2010   Hypertension    IBS 01/16/2009   IBS (irritable bowel syndrome)    Impaired glucose tolerance 05/18/2011   Knee pain    Menopause    early   Migraine    Obesity    OSTEOARTHRITIS, KNEES, 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 Swallow whole., Disp: , Rfl:    carisoprodol  (SOMA ) 350 MG tablet, Take 1 tablet by mouth every 6 (six) hours as needed., Disp: , Rfl:    cetirizine  (  ZYRTEC ) 10 MG tablet, Take 1 tablet (10 mg total) by mouth daily., Disp: 30 tablet, Rfl: 11   clotrimazole-betamethasone  (LOTRISONE) cream, APPLY TO AFFECTED AREA AND SURROUNDING AREAS OF SKIN TWICE A DAY IN MORNING AND EVENING FOR 2 WEEKS, Disp: , Rfl:    cyclobenzaprine  (FLEXERIL ) 10 MG tablet, PLACE 1 TABLET PER VAGINA AT BEDTIME, Disp: , Rfl:    econazole nitrate 1 % cream, Apply topically., Disp: , Rfl:    esomeprazole (NEXIUM) 20 MG capsule, Take 40 mg by mouth daily., Disp: , Rfl:    estradiol  (ESTRACE ) 0.5 MG tablet, Take 0.5 mg by mouth daily., Disp: , Rfl:    estradiol  (ESTRACE ) 1 MG tablet, Take 1 mg by mouth daily., Disp: , Rfl:    fluconazole  (DIFLUCAN ) 150 MG tablet, TAKE 1 TABLET BY MOUTH EVERY 3 DAYS AS NEEDED, Disp: 2  tablet, Rfl: 1   fluorouracil (EFUDEX) 5 % cream, 1 Application., Disp: , Rfl:    gabapentin  (NEURONTIN ) 100 MG capsule, Take 2 capsules (200 mg total) by mouth 3 (three) times daily as needed., Disp: 180 capsule, Rfl: 5   hydrocortisone 2.5 % cream, , Disp: , Rfl:    hydroquinone 4 % cream, 1 Application., Disp: , Rfl:    ketoconazole  (NIZORAL ) 2 % cream, , Disp: , Rfl:    Lancet Devices (ONETOUCH DELICA PLUS LANCING) MISC, 1 Device by Does not apply route daily., Disp: 1 each, Rfl: 0   losartan  (COZAAR ) 100 MG tablet, Take 1 tablet (100 mg total) by mouth daily., Disp: 90 tablet, Rfl: 3   losartan  (COZAAR ) 25 MG tablet, Take 1 tablet by mouth daily., Disp: , Rfl:    methocarbamol (ROBAXIN) 500 MG tablet, TAKE 1 TABLET BY MOUTH THREE TIMES A DAY FOR 10 DAYS, Disp: , Rfl:    nystatin  cream (MYCOSTATIN ), Apply topically., Disp: , Rfl:    nystatin -triamcinolone  (MYCOLOG II) cream, SMARTSIG:Topical Morning-Evening, Disp: , Rfl:    ONETOUCH VERIO test strip, CHECK BLOOD SUGAR 1 TIME DAILY, Disp: 50 strip, Rfl: 7   oxyCODONE  (OXY IR/ROXICODONE ) 5 MG immediate release tablet, TAKE 1-2 TABLETS BY MOUTH EVERY 4 HOURS AS NEEDED FOR SEVERE PAIN, Disp: , Rfl:    predniSONE  (DELTASONE ) 10 MG tablet, 3 tabs by mouth per day for 3 days,2tabs per day for 3 days,1tab per day for 3 days, Disp: 18 tablet, Rfl: 0   Probiotic Product (PROMELLA IN PREBIOTIC) CAPS, Take 1 capsule by mouth daily., Disp: , Rfl:    promethazine  (PHENERGAN ) 25 MG tablet, Take 25 mg by mouth daily as needed for nausea or vomiting., Disp: , Rfl:    rosuvastatin  (CRESTOR ) 10 MG tablet, TAKE 1 TABLET BY MOUTH EVERY DAY, Disp: 30 tablet, Rfl: 11   tirzepatide  (MOUNJARO ) 12.5 MG/0.5ML Pen, Inject 12.5 mg into [name] skin once a week., Disp: 6 mL, Rfl: 3   traZODone  (DESYREL ) 50 MG tablet, Take 50 mg by mouth., Disp: , Rfl:    triamcinolone  (NASACORT ) 55 MCG/ACT AERO nasal inhaler, Place 2 sprays into [name] nose daily., Disp: 1 each, Rfl: 12    Physical Exam:   BP 125/80   Pulse 74   SpO2 95%   Pertinent Findings  CN II-XII intact Bilateral EAC clear and TM intact with well pneumatized middle ear spaces Anterior rhinoscopy: Septum midline; bilateral inferior turbinates with no hypertrophy  No lesions of oral cavity/oropharynx; dentition wnl No obviously palpable neck masses/lymphadenopathy/thyromegaly No respiratory distress or stridor  Seprately Identifiable Procedures:  None  Impression & Plans:  Levy Wellman is a 63 y.o. female with [name] following   Ear fullness-  63 year old female seen in our office for evaluation today of ear fullness.  Illogic valuation within normal limits.  No abnormalities on physical exam.  [name] [name] [name] notes rather significant seasonal allergies, particularly around trees.  She notes that she takes Xyzal for this.  I would recommend adding Flonase .  I would also recommend follow-up with allergist for testing and potential treatment options to adequately manage her allergies.  I am happy to see her back in [name] office for any future questions or concerns she may have.   - f/u PRN   Thank you for allowing me [name] opportunity to care for your [name] [name]. Please do not hesitate to contact me should you have any other questions.  Sincerely, Chyrl Cohen PA-C [facility] Phone: [phone] Fax: [phone]  01/05/2024, 9:34 AM

## 2024-01-05 NOTE — Progress Notes (Signed)
  [address] [place], [place] [address] [phone]  Audiological Evaluation    Name: [name]     DOB:   12-Jul-1960      MRN:   [id number]                                                                                     Service Date: 01/05/2024     Accompanied by: unaccompanied   Patient comes today after Reyes Cohen, PA-C sent a referral for a hearing evaluation due to concerns with ear pain.   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 Reports allergies    Otoscopy: Right ear: Clear external ear canal and notable landmarks visualized on the tympanic membrane. Left ear:  Clear external ear canal and notable landmarks visualized on the tympanic membrane.  Tympanometry: Right ear: Type A- Normal external ear canal volume with normal middle ear pressure and tympanic membrane compliance. Left ear: Type A- Normal external ear canal volume with normal middle ear pressure and tympanic membrane compliance.    Pure tone Audiometry: Normal hearing from [phone], in both ears.  Speech Audiometry: Right ear- Speech Reception Threshold (SRT) was obtained at 10 dBHL. Left ear-Speech Reception Threshold (SRT) was obtained at 10 dBHL.   Word Recognition Score Tested using NU-6 (recorded) Right ear: 100% was obtained at a presentation level of 60 dBHL with contralateral masking which is deemed as  excellent. Left ear: 100% was obtained at a presentation level of 60 dBHL with contralateral masking which is deemed as  excellent.   The hearing test results were completed under headphones and results are deemed to be of good reliability. Test technique:  conventional     Recommendations: Follow up with ENT as scheduled for today. Return for a hearing evaluation if concerns with hearing changes arise or per MD recommendation.   Kary Sugrue MARIE LEROUX-MARTINEZ, AUD

## 2024-03-04 ENCOUNTER — Ambulatory Visit: Admitting: Internal Medicine

## 2024-03-04 ENCOUNTER — Encounter: Payer: Self-pay | Admitting: Internal Medicine

## 2024-03-04 VITALS — BP 120/72 | HR 73 | Ht 65.0 in | Wt 173.0 lb

## 2024-03-04 DIAGNOSIS — E119 Type 2 diabetes mellitus without complications: Secondary | ICD-10-CM | POA: Diagnosis not present

## 2024-03-04 DIAGNOSIS — E042 Nontoxic multinodular goiter: Secondary | ICD-10-CM

## 2024-03-04 LAB — POCT GLYCOSYLATED HEMOGLOBIN (HGB A1C): Hemoglobin A1C: 5.1 % (ref 4.0–5.6)

## 2024-03-04 MED ORDER — TIRZEPATIDE 12.5 MG/0.5ML ~~LOC~~ SOAJ: 12.5000 mg | SUBCUTANEOUS | 3 refills | Status: AC

## 2024-03-04 NOTE — Patient Instructions (Addendum)
 Continue Mounjaro 12.5 mg weekly

## 2024-03-04 NOTE — Progress Notes (Signed)
 Name: [name]  MRN/ DOB: [id number], 1960-12-30   Age/ Sex: 63 y.o., female    PCP: Norleen Lynwood ORN, MD   Reason for Endocrinology Evaluation: Type 2 Diabetes Mellitus     Date of Initial Endocrinology Visit: 04/15/2022    PATIENT IDENTIFIER: [name] is a 63 y.o. female with a past medical history of HTN, DM. The patient presented for initial endocrinology clinic visit on 04/15/2022 for consultative assistance with her diabetes management.    HPI: [name]  is here with her daughter    Diagnosed with DM 2019 with an A1c 2019           Hemoglobin A1c has ranged from 6.6% in 2019, peaking at 7.7% in 2023.  MULTINODULAR GOITER: She has been diagnosed with multinodular goiter based on thyroid  ultrasound in 2021.  She is s/p benign FNA of the left inferior nodule in April 2021  She sees [facility] for estrogen management.   SUBJECTIVE:   During the last visit (09/03/2023): A1c 5.0%    Today (03/04/24): [name] is here mainly to discuss her neck pain.  She also follow-up on diabetes management and multinodular goiter. She checks her blood sugars occasionally.   Patient follows with cardiology for CAD, HTN She continues to follow-up with the pain management clinic She has established care with urology for incontinence She had a follow-up with podiatry June, 2025  Patient continues with weight loss No nausea but had a few episodes of lightheadedness due to not eating  Continues with constipation - uses stool softeners  NO local neck swelling  Has rare palpitations  No tremors  She is on MVI   HOME DIABETES REGIMEN: Tirzepatide  12.5 mg weekly     Statin: yes ACE-I/ARB: yes  METER DOWNLOAD SUMMARY:8/13-9/04/2024 Average Number Tests/Day = 0 Overall Mean FS Glucose = 96 Standard Deviation =8  BG Ranges: Low = 87 High = 109  BG Target % Results: % In target = 100 % Over target = 0 % Under target = 0  Hypoglycemic Events/30  Days: BG < 50 = 0 Episodes of symptomatic severe hypoglycemia = 0   DIABETIC COMPLICATIONS: Microvascular complications:  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  Denies: CKD Last eye exam: Completed  07/2022  Macrovascular complications:  CAD (patient has a calcium  score of 108 indicating presence of plaque) Denies:  PVD, CVA   PAST HISTORY: Past Medical History:  Past Medical History:  Diagnosis Date   Anemia    ANEMIA-IRON DEFICIENCY 01/03/2007   Anxiety    ANXIETY 01/03/2007   Back pain    Complication of anesthesia    severe post-operative nausea except for bladder surgery in 2002 at [facility]-did not have complications then   Depression    DEPRESSION 01/03/2007   Diabetes (HCC)    DJD (degenerative joint disease)    knee   Edema of both lower extremities    Enlarged thyroid  10/18/2019   Family history of adverse reaction to anesthesia    mother and father both had severe post-operative nausea and vomiting   Fatty liver    Foot pain    GERD 01/16/2009   GERD (gastroesophageal reflux disease)    Headache(784.0) 01/03/2007   Hyperplastic colon polyp    04/2010   HYPERSOMNIA 04/04/2010   Hypertension    IBS 01/16/2009   IBS (irritable bowel syndrome)    Impaired glucose tolerance 05/18/2011   Knee pain    Menopause    early  Migraine    Obesity    OSTEOARTHRITIS, KNEES, 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 She has never used smokeless tobacco. She reports that she does not drink alcohol and does not use drugs. Family History:  Family History  Problem Relation Age of Onset   Hypertension Mother    Diabetes Brother       HOME MEDICATIONS: Allergies as of 03/04/2024       Reactions   Meperidine Shortness Of Breath   Morphine Anaphylaxis   Oxycodone -acetaminophen  Dermatitis, Other (See Comments)   acetaminophen  / oxycodone    Penicillins Hives, Nausea Only, Swelling, Other (See Comments), Anaphylaxis, Nausea And Vomiting   PATIENT HAS HAD A PCN REACTION WITH IMMEDIATE RASH, FACIAL/TONGUE/THROAT SWELLING, SOB, OR LIGHTHEADEDNESS WITH HYPOTENSION:  #  #  YES  #  #  HAS PT DEVELOPED SEVERE RASH INVOLVING MUCUS MEMBRANES or SKIN NECROSIS: #  #  YES  #  #  PATIENT HAS HAD A PCN REACTION THAT REQUIRED HOSPITALIZATION:  #  #  YES  #  #  Has patient had a PCN reaction occurring within the last 10 years: No vomiting PATIENT HAS HAD A PCN REACTION WITH IMMEDIATE RASH, FACIAL/TONGUE/THROAT SWELLING, SOB, OR LIGHTHEADEDNESS WITH HYPOTENSION:  #  #  YES  #  #  HAS PT DEVELOPED SEVERE RASH INVOLVING MUCUS MEMBRANES or SKIN NECROSIS: #  #  YES  #  #  PATIENT HAS HAD A PCN REACTION THAT REQUIRED HOSPITALIZATION:  #  #  YES  #  #  Has patient had a PCN reaction occurring within the last 10 years: No   Azithromycin  Nausea And Vomiting, Nausea Only   Lisinopril  Cough       Sucralfate  Rash, Other (See Comments)   Blurry vision  Blurry vision    Doxycycline  Nausea Only   nausea   Gabapentin  Other (See Comments)   Muscle pain, fatigue   Other    Tyloxapol Other (See Comments)   Amoxicillin Nausea Only   Codeine Nausea Only, Other (See Comments)   Hot flashes also   Hydrocodone  Itching, Nausea Only, Rash   Omeprazole Itching, Rash   Oxycodone  Nausea Only, Rash, Nausea And Vomiting   Oxycodone -acetaminophen  Nausea And Vomiting, Rash   Tramadol Itching, Nausea Only, Rash        Medication List        Accurate as of March 04, 2024  9:39 AM. If you have any questions, ask your nurse or doctor.          aspirin EC 81 MG tablet Take 81 mg by mouth daily. Swallow whole.   carisoprodol  350 MG  tablet Commonly known as: SOMA  Take 1 tablet by mouth every 6 (six) hours as needed.   cetirizine  10 MG tablet Commonly known as: ZYRTEC  Take 1 tablet (10 mg total) by mouth daily.   clotrimazole-betamethasone  cream Commonly known as: LOTRISONE APPLY TO AFFECTED AREA AND SURROUNDING AREAS OF SKIN TWICE A DAY IN MORNING AND EVENING FOR 2 WEEKS   cyclobenzaprine  10 MG tablet Commonly known as: FLEXERIL  PLACE 1 TABLET PER VAGINA  AT BEDTIME   econazole nitrate 1 % cream Apply topically.   esomeprazole 20 MG capsule Commonly known as: NEXIUM Take 40 mg by mouth daily.   estradiol  1 MG tablet Commonly known as: ESTRACE  Take 1 mg by mouth daily.   estradiol  0.5 MG tablet Commonly known as: ESTRACE  Take 0.5 mg by mouth daily.   fluconazole  150 MG tablet Commonly known as: DIFLUCAN  TAKE 1 TABLET BY MOUTH EVERY 3 DAYS AS NEEDED   fluorouracil 5 % cream Commonly known as: EFUDEX 1 Application.   fluticasone  50 MCG/ACT nasal spray Commonly known as: FLONASE  Place 2 sprays into both nostrils daily.   gabapentin  100 MG capsule Commonly known as: NEURONTIN  Take 2 capsules (200 mg total) by mouth 3 (three) times daily as needed.   hydrocortisone 2.5 % cream   hydroquinone 4 % cream 1 Application.   ketoconazole  2 % cream Commonly known as: NIZORAL    losartan  25 MG tablet Commonly known as: COZAAR  Take 1 tablet by mouth daily.   losartan  100 MG tablet Commonly known as: COZAAR  Take 1 tablet (100 mg total) by mouth daily.   methocarbamol 500 MG tablet Commonly known as: ROBAXIN TAKE 1 TABLET BY MOUTH THREE TIMES A DAY FOR 10 DAYS   nystatin  cream Commonly known as: MYCOSTATIN  Apply topically.   nystatin -triamcinolone  cream Commonly known as: MYCOLOG II SMARTSIG:Topical Morning-Evening   OneTouch Delica Plus Lancing Misc 1 Device by Does not apply route daily.   OneTouch Verio test strip Generic drug: glucose blood CHECK BLOOD SUGAR 1 TIME DAILY    oxyCODONE  5 MG immediate release tablet Commonly known as: Oxy IR/ROXICODONE  TAKE 1-2 TABLETS BY MOUTH EVERY 4 HOURS AS NEEDED FOR SEVERE PAIN   predniSONE  10 MG tablet Commonly known as: DELTASONE  3 tabs by mouth per day for 3 days,2tabs per day for 3 days,1tab per day for 3 days   Promella in Prebiotic Caps Take 1 capsule by mouth daily.   promethazine  25 MG tablet Commonly known as: PHENERGAN  Take 25 mg by mouth daily as needed for nausea or vomiting.   rosuvastatin  10 MG tablet Commonly known as: CRESTOR  TAKE 1 TABLET BY MOUTH EVERY DAY   tirzepatide  12.5 MG/0.5ML Pen Commonly known as: MOUNJARO  Inject 12.5 mg into the skin once a week.   traZODone  50 MG tablet Commonly known as: DESYREL  Take 50 mg by mouth.   triamcinolone  55 MCG/ACT Aero nasal inhaler Commonly known as: NASACORT  Place 2 sprays into the nose daily.         ALLERGIES: Allergies  Allergen Reactions   Meperidine Shortness Of Breath   Morphine Anaphylaxis   Oxycodone -Acetaminophen  Dermatitis and Other (See Comments)    acetaminophen  / oxycodone    Penicillins Hives, Nausea Only, Swelling, Other (See Comments), Anaphylaxis and Nausea And Vomiting    PATIENT HAS HAD A PCN REACTION WITH IMMEDIATE RASH, FACIAL/TONGUE/THROAT SWELLING, SOB, OR LIGHTHEADEDNESS WITH HYPOTENSION:  #  #  YES  #  #  HAS PT DEVELOPED SEVERE RASH INVOLVING MUCUS MEMBRANES or SKIN NECROSIS: #  #  YES  #  #  PATIENT HAS HAD A PCN REACTION THAT REQUIRED HOSPITALIZATION:  #  #  YES  #  #  Has patient had a PCN reaction occurring within the last 10 years: No  vomiting PATIENT HAS HAD A PCN REACTION WITH IMMEDIATE RASH, FACIAL/TONGUE/THROAT SWELLING, SOB, OR LIGHTHEADEDNESS WITH HYPOTENSION:  #  #  YES  #  #  HAS PT DEVELOPED SEVERE RASH INVOLVING MUCUS MEMBRANES or  SKIN NECROSIS: #  #  YES  #  #  PATIENT HAS HAD A PCN REACTION THAT REQUIRED HOSPITALIZATION:  #  #  YES  #  #  Has patient had a PCN reaction occurring within the  last 10 years: No   Azithromycin  Nausea And Vomiting and Nausea Only   Lisinopril  Cough        Sucralfate  Rash and Other (See Comments)    Blurry vision  Blurry vision     Doxycycline  Nausea Only    nausea   Gabapentin  Other (See Comments)    Muscle pain, fatigue   Other    Tyloxapol Other (See Comments)   Amoxicillin Nausea Only   Codeine Nausea Only and Other (See Comments)    Hot flashes also   Hydrocodone  Itching, Nausea Only and Rash   Omeprazole Itching and Rash   Oxycodone  Nausea Only, Rash and Nausea And Vomiting   Oxycodone -Acetaminophen  Nausea And Vomiting and Rash   Tramadol Itching, Nausea Only and Rash     REVIEW OF SYSTEMS: A comprehensive ROS was conducted with the patient and is negative except as per HPI    OBJECTIVE:   VITAL SIGNS: BP 120/72 (BP Location: Left Arm, Patient Position: Sitting, Cuff Size: Normal)   Pulse 73   Ht 5' 5 (1.651 m)   Wt 173 lb (78.5 kg)   SpO2 98%   BMI 28.79 kg/m    PHYSICAL EXAM:  General: Pt appears well and is in NAD  Neck: No thyroid  nodules appreciated on today's exam  Lungs: Clear with good BS bilat   Heart: RRR   Extremities:  Lower extremities - No pretibial edema  Neuro: MS is good with appropriate affect, pt is alert and Ox3    DM foot exam:12/05/2023 per podiatry   DATA REVIEWED:  Lab Results  Component Value Date   HGBA1C 5.1 03/04/2024   HGBA1C 5.0 09/03/2023   HGBA1C 5.3 06/05/2023     Latest Reference Range & Units 11/24/23 10:03  Sodium 135 - 145 mEq/L 138  Potassium 3.5 - 5.1 mEq/L 3.6  Chloride 96 - 112 mEq/L 105  CO2 19 - 32 mEq/L 24  Glucose 70 - 99 mg/dL 82  BUN 6 - 23 mg/dL 18  Creatinine 9.59 - 8.79 mg/dL 9.23  Calcium  8.4 - 10.5 mg/dL 9.6  Alkaline Phosphatase 39 - 117 U/L 40  Albumin 3.5 - 5.2 g/dL 4.3  AST 0 - 37 U/L 18  ALT 0 - 35 U/L 12  Total Protein 6.0 - 8.3 g/dL 7.3  Bilirubin, Direct 0.0 - 0.3 mg/dL 0.0  Total Bilirubin 0.2 - 1.2 mg/dL 0.4  GFR >39.99 mL/min 82.96     Latest Reference Range & Units 11/24/23 10:03  Total CHOL/HDL Ratio  3  Cholesterol 0 - 200 mg/dL 863  HDL Cholesterol >60.99 mg/dL 49.69  LDL (calc) 0 - 99 mg/dL 59  NonHDL  14.06  Triglycerides 0.0 - 149.0 mg/dL 866.9  VLDL 0.0 - 59.9 mg/dL 73.3    Latest Reference Range & Units 11/24/23 10:03  TSH 0.35 - 5.50 uIU/mL 0.84     THYROID  ULTRASOUND 09/12/2023  Estimated total number of nodules >/= 1 cm: 4   Number of spongiform nodules >/=  2 cm not described below (TR1): 0   Number of mixed cystic and solid nodules >/= 1.5 cm not described below (TR2): 0   _________________________________________________________   Nodule # 1: Hypoechoic solid nodule in the right upper gland measures 1.4 x 1.2  x 0.7 cm. This lesion was previously described spongiform but is more solid in appearance on today's exam. Findings are consistent with TI-RADS category 4. *Given size (>/= 1 - 1.4 cm) and appearance, a follow-up ultrasound in 1 year should be considered based on TI-RADS criteria.   Nodule # 7: Previously biopsied lesion in the left lower gland is decreased in size at 1.7 x 1.6 x 0.8 cm.   The previously identified nodule in the left mid gland which was recommended for imaging surveillance is no longer evident appears to have involuted. Additional tiny subcentimeter nodules are present scattered throughout the left gland but none meet criteria for biopsy or imaging surveillance.   IMPRESSION: 1. Decreasing size of previously biopsied nodule in the left mid to lower gland. Involution over time is consistent with benignity. 2. Interval change in appearance of nodule # 1 in the right upper gland. This lesion is now more solid in appearance and consistent with TI-RADS category 4 meeting criteria for imaging surveillance. Recommend follow-up ultrasound in 1 year. 3. Previously identified nodule # 3 in the left mid gland is not identified on today's exam and appears to have  involuted.     FNA left inferior thyroid  nodule 10/14/2019   Clinical History: None provided  Specimen Submitted:  A. THYROID ,LEFT INFERIOR, FINE NEEDLE ASPIRATION:    FINAL MICROSCOPIC DIAGNOSIS:  - Consistent with benign follicular nodule (Bethesda category II)    Old records , labs and images have been reviewed.    ASSESSMENT / PLAN / RECOMMENDATIONS:   1) Type 2 Diabetes Mellitus, Optimally  controlled, With neuropathic  and macrovascular complications - Most recent A1c of 5.0%. Goal A1c < 7.0 %.    -A1c remains optimal -Encouraged regular exercise with walking -We have opted to remain on current dose of Mounjaro  as she is continuing to lose the weight, we will consider increasing if she plateaus  MEDICATIONS: Continue Mounjaro  12.5 mg weekly  EDUCATION / INSTRUCTIONS: BG monitoring instructions: Patient is instructed to check her blood sugars 2-3 times a week.   2) Diabetic complications:  Eye: Does not have known diabetic retinopathy.  Neuro/ Feet: Does have known diabetic peripheral neuropathy. Renal: Patient does not have known baseline CKD. She is  on an ACEI/ARB at present.  3) MULTINODULAR GOITER:  -Patient is clinically euthyroid -No local neck symptoms -TSH from June, 2025 was normal -She is s/p benign FNA of the left thyroid  nodule in 2021 - Repeat ultrasound March, 2025 showed decrease in size of previously biopsied nodule, there was a change in another nodule from spongiform to solid, which met criteria for a repeat ultrasound in 1 year     Follow-up in 6 months   Signed electronically by: Stefano Redgie Butts, MD  [facility] Endocrinology  [facility] [address] [place], [place] [address] Phone: [phone] FAX: [phone]   CC: Norleen Lynwood ORN, MD [address] [place] [address] Phone: [phone]  Fax: [phone]    Return to Endocrinology clinic as below: Future Appointments  Date Time  Provider Department Center  03/04/2024  9:50 AM Jamisha Hoeschen, Donell Redgie, MD [facility] None  03/16/2024  2:20 PM Norleen Lynwood ORN, MD [facility]  04/09/2024  9:15 AM Palmer Purchase, PA-C [facility] None  05/26/2024  9:40 AM Norleen Lynwood ORN, MD [facility]

## 2024-03-16 ENCOUNTER — Ambulatory Visit (INDEPENDENT_AMBULATORY_CARE_PROVIDER_SITE_OTHER)

## 2024-03-16 ENCOUNTER — Ambulatory Visit: Admitting: Internal Medicine

## 2024-03-16 DIAGNOSIS — Z23 Encounter for immunization: Secondary | ICD-10-CM | POA: Diagnosis not present

## 2024-03-16 NOTE — Progress Notes (Signed)
 Flu vaccine given w/o complications

## 2024-04-09 ENCOUNTER — Ambulatory Visit (INDEPENDENT_AMBULATORY_CARE_PROVIDER_SITE_OTHER): Admitting: Physician Assistant

## 2024-04-09 ENCOUNTER — Encounter (INDEPENDENT_AMBULATORY_CARE_PROVIDER_SITE_OTHER): Payer: Self-pay | Admitting: Physician Assistant

## 2024-04-09 VITALS — BP 119/78 | HR 69

## 2024-04-09 DIAGNOSIS — J301 Allergic rhinitis due to pollen: Secondary | ICD-10-CM

## 2024-04-09 DIAGNOSIS — J309 Allergic rhinitis, unspecified: Secondary | ICD-10-CM

## 2024-04-09 NOTE — Progress Notes (Signed)
 Dear Dr. Norleen, Here is my assessment for our mutual patient, [name]. Thank you for allowing me the opportunity to care for your patient. Please do not hesitate to contact me should you have any other questions. Sincerely, Chyrl Cohen PA-C  Otolaryngology Clinic Note Referring provider: Dr. Norleen HPI:  [name] is a 63 y.o. female kindly referred by Dr. Norleen   The patient is a 63 year old female seen in our office for follow-up evaluation of bilateral ear fullness and allergic rhinitis.  She was last seen in office on 01/05/2024.  Below is recap of the encounter.   The patient is a 63 year old female seen in our office for evaluation of left ear discomfort.  The patient notes that she has had episodes of fullness in the left ear, she notes some swelling surrounding the ear.  She notes some popping in her nose when blowing her nose.  She reports that her symptoms are aggravated by allergies.  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  She notes that after being outside she has significant nasal congestion and fullness in the left ear, she gets occasional swelling around the left ear as well.  She notes that when she is not around these trees symptoms improved.  She notes she takes Xyzal but has not seen an allergist previously.  She denies any history of recurrent otitis media but does note history of otitis externa.  She also notes a history of sinus infections reporting approximately 2 a year.  Today symptoms are mild.  No changes to her hearing    Update 04/09/2024-  Since her last office visit she notes she is done very well.  She notes her symptoms have resolved, no further pressure.  She notes she has better control of her rhinitis.  She notes there is a strong correlation between her working in her yard and seasonal allergies.  She has no other acute concerns at today's visit.  She continue using Flonase  and daily antihistamine.    Independent  Review of Additional Tests or Records:  None   PMH/Meds/All/SocHx/FamHx/ROS:   Past Medical History:  Diagnosis Date   Anemia    ANEMIA-IRON DEFICIENCY 01/03/2007   Anxiety    ANXIETY 01/03/2007   Back pain    Complication of anesthesia    severe post-operative nausea except for bladder surgery in 2002 at [facility]-did not have complications then   Depression    DEPRESSION 01/03/2007   Diabetes (HCC)    DJD (degenerative joint disease)    knee   Edema of both lower extremities    Enlarged thyroid  10/18/2019   Family history of adverse reaction to anesthesia    mother and father both had severe post-operative nausea and vomiting   Fatty liver    Foot pain    GERD 01/16/2009   GERD (gastroesophageal reflux disease)    Headache(784.0) 01/03/2007   Hyperplastic colon polyp    04/2010   HYPERSOMNIA 04/04/2010   Hypertension    IBS 01/16/2009   IBS (irritable bowel syndrome)    Impaired glucose tolerance 05/18/2011   Knee pain    Menopause    early   Migraine    Obesity    OSTEOARTHRITIS, KNEES, 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 Swallow whole., Disp: , Rfl:    carisoprodol  (SOMA ) 350 MG tablet, Take 1 tablet by mouth every 6 (six) hours as needed., Disp: , Rfl:    cetirizine  (ZYRTEC ) 10 MG tablet, Take 1 tablet (10 mg total) by mouth daily., Disp: 30 tablet, Rfl: 11   clotrimazole-betamethasone  (LOTRISONE) cream, APPLY TO AFFECTED AREA AND SURROUNDING AREAS OF SKIN TWICE A DAY IN  MORNING AND EVENING FOR 2 WEEKS, Disp: , Rfl:    cyclobenzaprine  (FLEXERIL ) 10 MG tablet, PLACE 1 TABLET PER VAGINA AT BEDTIME, Disp: , Rfl:    econazole nitrate 1 % cream, Apply topically., Disp: , Rfl:    esomeprazole (NEXIUM) 20 MG capsule, Take 40 mg by mouth daily., Disp: , Rfl:    estradiol  (ESTRACE ) 0.5 MG tablet, Take 0.5 mg by mouth daily., Disp: , Rfl:    estradiol  (ESTRACE ) 1 MG tablet, Take 1 mg by mouth daily., Disp: , Rfl:    fluconazole  (DIFLUCAN ) 150 MG tablet, TAKE 1 TABLET BY MOUTH EVERY 3 DAYS AS NEEDED, Disp: 2 tablet, Rfl: 1   fluorouracil (EFUDEX) 5 % cream, 1 Application., Disp: , Rfl:    fluticasone  (FLONASE ) 50 MCG/ACT nasal spray, Place 2 sprays into both nostrils daily., Disp: 16 g, Rfl: 6   gabapentin  (NEURONTIN ) 100 MG capsule, Take 2 capsules (200 mg total) by mouth 3 (three) times daily as needed., Disp: 180 capsule, Rfl: 5   hydrocortisone 2.5 % cream, , Disp: , Rfl:    hydroquinone 4 % cream, 1 Application., Disp: , Rfl:    ketoconazole  (NIZORAL ) 2 % cream, , Disp: , Rfl:    Lancet Devices (ONETOUCH DELICA PLUS LANCING) MISC, 1 Device by Does not apply route daily., Disp: 1 each, Rfl: 0   losartan  (COZAAR ) 100 MG tablet, Take 1 tablet (100 mg total) by mouth daily., Disp: 90 tablet, Rfl: 3   losartan  (COZAAR ) 25 MG tablet, Take 1 tablet by mouth daily., Disp: , Rfl:    methocarbamol (ROBAXIN) 500 MG tablet, TAKE 1 TABLET BY MOUTH THREE TIMES A DAY FOR 10 DAYS, Disp: , Rfl:    nystatin  cream (MYCOSTATIN ), Apply topically., Disp: , Rfl:    nystatin -triamcinolone  (MYCOLOG II) cream, SMARTSIG:Topical Morning-Evening, Disp: , Rfl:    ONETOUCH VERIO test strip, CHECK BLOOD SUGAR 1 TIME DAILY, Disp: 50 strip, Rfl: 7   oxyCODONE  (OXY IR/ROXICODONE ) 5 MG immediate release tablet, TAKE 1-2 TABLETS BY MOUTH EVERY 4 HOURS AS NEEDED FOR SEVERE PAIN, Disp: , Rfl:    Probiotic Product (PROMELLA IN PREBIOTIC) CAPS, Take 1 capsule by mouth daily., Disp: , Rfl:    promethazine   (PHENERGAN ) 25 MG tablet, Take 25 mg by mouth daily as needed for nausea or vomiting., Disp: , Rfl:    rosuvastatin  (CRESTOR ) 10 MG tablet, TAKE 1 TABLET BY MOUTH EVERY DAY, Disp: 30 tablet, Rfl: 11   tirzepatide  (MOUNJARO ) 12.5 MG/0.5ML Pen, Inject 12.5 mg into the skin once a week., Disp: 6 mL, Rfl: 3   traZODone  (DESYREL ) 50 MG tablet, Take 50 mg by mouth., Disp: , Rfl:    triamcinolone  (NASACORT )  55 MCG/ACT AERO nasal inhaler, Place 2 sprays into the nose daily., Disp: 1 each, Rfl: 12   predniSONE  (DELTASONE ) 10 MG tablet, 3 tabs by mouth per day for 3 days,2tabs per day for 3 days,1tab per day for 3 days (Patient not taking: Reported on 04/09/2024), Disp: 18 tablet, Rfl: 0   Physical Exam:   BP 119/78   Pulse 69   SpO2 98%   Pertinent Findings  CN II-XII intact Bilateral EAC clear and TM intact with well pneumatized middle ear spaces Weber 512: equal Rinne 512: AC > BC b/l  Anterior rhinoscopy: Septum midline; bilateral inferior turbinates with moderate hypertrophy No lesions of oral cavity/oropharynx; dentition within normal limits No obviously palpable neck masses/lymphadenopathy/thyromegaly No respiratory distress or stridor  Seprately Identifiable Procedures:  None  Impression & Plans:  Sharla Tankard is a 63 y.o. female with the following   Allergic rhinitis-   Symptoms seem to be well-controlled with daily Flonase  and Claritin.  I would recommend nasal saline irrigation.  If her symptoms worsen I would like her to reach out the office for further evaluation and management.  Ear fullness-  Symptoms have resolved and are improved with improvement of allergic rhinitis.   - f/u PRN   Thank you for allowing me the opportunity to care for your patient. Please do not hesitate to contact me should you have any other questions.  Sincerely, Chyrl Cohen PA-C [facility] Phone: [phone] Fax: [phone]  04/09/2024, 9:41 AM

## 2024-05-26 ENCOUNTER — Ambulatory Visit: Admitting: Internal Medicine

## 2024-05-31 ENCOUNTER — Ambulatory Visit: Admitting: Internal Medicine

## 2024-05-31 ENCOUNTER — Encounter: Payer: Self-pay | Admitting: Internal Medicine

## 2024-05-31 ENCOUNTER — Ambulatory Visit: Payer: Self-pay | Admitting: Internal Medicine

## 2024-05-31 VITALS — BP 122/78 | HR 75 | Temp 97.6°F | Ht 65.0 in | Wt 168.0 lb

## 2024-05-31 DIAGNOSIS — E785 Hyperlipidemia, unspecified: Secondary | ICD-10-CM

## 2024-05-31 DIAGNOSIS — E559 Vitamin D deficiency, unspecified: Secondary | ICD-10-CM

## 2024-05-31 DIAGNOSIS — E538 Deficiency of other specified B group vitamins: Secondary | ICD-10-CM

## 2024-05-31 DIAGNOSIS — I10 Essential (primary) hypertension: Secondary | ICD-10-CM

## 2024-05-31 DIAGNOSIS — E1165 Type 2 diabetes mellitus with hyperglycemia: Secondary | ICD-10-CM

## 2024-05-31 LAB — CBC WITH DIFFERENTIAL/PLATELET
Basophils Absolute: 0.1 K/uL (ref 0.0–0.1)
Basophils Relative: 1.1 % (ref 0.0–3.0)
Eosinophils Absolute: 0.3 K/uL (ref 0.0–0.7)
Eosinophils Relative: 5.9 % — ABNORMAL HIGH (ref 0.0–5.0)
HCT: 43.2 % (ref 36.0–46.0)
Hemoglobin: 14.7 g/dL (ref 12.0–15.0)
Lymphocytes Relative: 38.5 % (ref 12.0–46.0)
Lymphs Abs: 2.2 K/uL (ref 0.7–4.0)
MCHC: 34 g/dL (ref 30.0–36.0)
MCV: 88.8 fl (ref 78.0–100.0)
Monocytes Absolute: 0.4 K/uL (ref 0.1–1.0)
Monocytes Relative: 6.7 % (ref 3.0–12.0)
Neutro Abs: 2.8 K/uL (ref 1.4–7.7)
Neutrophils Relative %: 47.8 % (ref 43.0–77.0)
Platelets: 195 K/uL (ref 150.0–400.0)
RBC: 4.86 Mil/uL (ref 3.87–5.11)
RDW: 12.6 % (ref 11.5–15.5)
WBC: 5.8 K/uL (ref 4.0–10.5)

## 2024-05-31 LAB — LIPID PANEL
Cholesterol: 146 mg/dL (ref 0–200)
HDL: 51.7 mg/dL
LDL Cholesterol: 72 mg/dL (ref 0–99)
NonHDL: 94.4
Total CHOL/HDL Ratio: 3
Triglycerides: 112 mg/dL (ref 0.0–149.0)
VLDL: 22.4 mg/dL (ref 0.0–40.0)

## 2024-05-31 LAB — BASIC METABOLIC PANEL WITH GFR
BUN: 12 mg/dL (ref 6–23)
CO2: 27 meq/L (ref 19–32)
Calcium: 9.4 mg/dL (ref 8.4–10.5)
Chloride: 106 meq/L (ref 96–112)
Creatinine, Ser: 0.83 mg/dL (ref 0.40–1.20)
GFR: 74.37 mL/min
Glucose, Bld: 73 mg/dL (ref 70–99)
Potassium: 3.7 meq/L (ref 3.5–5.1)
Sodium: 141 meq/L (ref 135–145)

## 2024-05-31 LAB — HEPATIC FUNCTION PANEL
ALT: 11 U/L (ref 0–35)
AST: 17 U/L (ref 0–37)
Albumin: 4.4 g/dL (ref 3.5–5.2)
Alkaline Phosphatase: 38 U/L — ABNORMAL LOW (ref 39–117)
Bilirubin, Direct: 0.1 mg/dL (ref 0.0–0.3)
Total Bilirubin: 0.4 mg/dL (ref 0.2–1.2)
Total Protein: 7.5 g/dL (ref 6.0–8.3)

## 2024-05-31 LAB — HEMOGLOBIN A1C: Hgb A1c MFr Bld: 4.8 % (ref 4.6–6.5)

## 2024-05-31 LAB — VITAMIN D 25 HYDROXY (VIT D DEFICIENCY, FRACTURES): VITD: 54.66 ng/mL (ref 30.00–100.00)

## 2024-05-31 NOTE — Assessment & Plan Note (Signed)
 BP Readings from Last 3 Encounters:  05/31/24 122/78  04/09/24 119/78  03/04/24 120/72   Stable, pt to continue medical treatment losartan  100 mg qd

## 2024-05-31 NOTE — Assessment & Plan Note (Signed)
 Last vitamin D  Lab Results  Component Value Date   VD25OH 54.66 05/31/2024   Stable, cont oral replacement

## 2024-05-31 NOTE — Progress Notes (Signed)
 Patient ID: [name], female   DOB: Mar 26, 1961, 63 y.o.   MRN: [id number]        Chief Complaint: follow up HTN, HLD, DM, low vit d and b12       HPI:  [name] is a 63 y.o. female here overall doing ok,  Pt denies chest pain, increased sob or doe, wheezing, orthopnea, PND, increased LE swelling, palpitations, dizziness or syncope.   Pt denies polydipsia, polyuria, or new focal neuro s/s.    Pt denies fever, wt loss, night sweats, loss of appetite, or other constitutional symptoms   Pt losing wt with GLP1, goal is about 130 lbs, peak wt has been 250.    Wt Readings from Last 3 Encounters:  05/31/24 168 lb (76.2 kg)  03/04/24 173 lb (78.5 kg)  11/24/23 175 lb (79.4 kg)   BP Readings from Last 3 Encounters:  05/31/24 122/78  04/09/24 119/78  03/04/24 120/72         Past Medical History:  Diagnosis Date   Anemia    ANEMIA-IRON DEFICIENCY 01/03/2007   Anxiety    ANXIETY 01/03/2007   Back pain    Complication of anesthesia    severe post-operative nausea except for bladder surgery in 2002 at [facility]-did not have complications then   Depression    DEPRESSION 01/03/2007   Diabetes (HCC)    DJD (degenerative joint disease)    knee   Edema of both lower extremities    Enlarged thyroid  10/18/2019   Family history of adverse reaction to anesthesia    mother and father both had severe post-operative nausea and vomiting   Fatty liver    Foot pain    GERD 01/16/2009   GERD (gastroesophageal reflux disease)    Headache(784.0) 01/03/2007   Hyperplastic colon polyp    04/2010   HYPERSOMNIA 04/04/2010   Hypertension    IBS 01/16/2009   IBS (irritable bowel syndrome)    Impaired glucose tolerance 05/18/2011   Knee pain    Menopause    early   Migraine    Obesity    OSTEOARTHRITIS, KNEES, 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 She has never used smokeless tobacco. She reports that she does not drink alcohol and does not use drugs. family history includes Diabetes in her brother; Hypertension in her mother. Allergies  Allergen Reactions   Meperidine Shortness Of Breath   Morphine Anaphylaxis   Oxycodone -Acetaminophen  Dermatitis and Other (See Comments)    acetaminophen  / oxycodone    Penicillins Hives, Nausea Only, Swelling, Other (See Comments), Anaphylaxis and Nausea And Vomiting    PATIENT HAS HAD A PCN REACTION WITH IMMEDIATE RASH, FACIAL/TONGUE/THROAT SWELLING, SOB, OR LIGHTHEADEDNESS WITH HYPOTENSION:  #  #  YES  #  #  HAS PT DEVELOPED SEVERE RASH INVOLVING MUCUS MEMBRANES or SKIN NECROSIS: #  #  YES  #  #  PATIENT HAS HAD A PCN REACTION THAT REQUIRED HOSPITALIZATION:  #  #  YES  #  #  Has patient had a PCN reaction occurring within the last 10 years: No  vomiting PATIENT HAS HAD A PCN  REACTION WITH IMMEDIATE RASH, FACIAL/TONGUE/THROAT SWELLING, SOB, OR LIGHTHEADEDNESS WITH HYPOTENSION:  #  #  YES  #  #  HAS PT DEVELOPED SEVERE RASH INVOLVING MUCUS MEMBRANES or SKIN NECROSIS: #  #  YES  #  #  PATIENT HAS HAD A PCN REACTION THAT REQUIRED HOSPITALIZATION:  #  #  YES  #  #  Has patient had a PCN reaction occurring within the last 10 years: No   Azithromycin  Nausea And Vomiting and Nausea Only   Lisinopril  Cough        Sucralfate  Rash and Other (See Comments)    Blurry vision  Blurry vision     Doxycycline  Nausea Only    nausea   Gabapentin  Other (See Comments)    Muscle pain, fatigue   Other    Tyloxapol Other (See Comments)   Amoxicillin Nausea Only   Codeine Nausea Only and Other (See Comments)    Hot flashes also   Hydrocodone  Itching, Nausea Only and Rash   Omeprazole Itching and Rash   Oxycodone  Nausea Only, Rash and Nausea And  Vomiting   Oxycodone -Acetaminophen  Nausea And Vomiting and Rash   Tramadol Itching, Nausea Only and Rash   Current Outpatient Medications on File Prior to Visit  Medication Sig Dispense Refill   mirabegron ER (MYRBETRIQ) 25 MG TB24 tablet Take 25 mg by mouth daily.     aspirin EC 81 MG tablet Take 81 mg by mouth daily. Swallow whole.     carisoprodol  (SOMA ) 350 MG tablet Take 1 tablet by mouth every 6 (six) hours as needed.     cetirizine  (ZYRTEC ) 10 MG tablet Take 1 tablet (10 mg total) by mouth daily. 30 tablet 11   clotrimazole-betamethasone  (LOTRISONE) cream APPLY TO AFFECTED AREA AND SURROUNDING AREAS OF SKIN TWICE A DAY IN MORNING AND EVENING FOR 2 WEEKS     cyclobenzaprine  (FLEXERIL ) 10 MG tablet PLACE 1 TABLET PER VAGINA AT BEDTIME     econazole nitrate 1 % cream Apply topically.     esomeprazole (NEXIUM) 20 MG capsule Take 40 mg by mouth daily.     estradiol  (ESTRACE ) 0.5 MG tablet Take 0.5 mg by mouth daily.     estradiol  (ESTRACE ) 1 MG tablet Take 1 mg by mouth daily.     fluconazole  (DIFLUCAN ) 150 MG tablet TAKE 1 TABLET BY MOUTH EVERY 3 DAYS AS NEEDED 2 tablet 1   fluorouracil (EFUDEX) 5 % cream 1 Application.     fluticasone  (FLONASE ) 50 MCG/ACT nasal spray Place 2 sprays into both nostrils daily. 16 g 6   gabapentin  (NEURONTIN ) 100 MG capsule Take 2 capsules (200 mg total) by mouth 3 (three) times daily as needed. 180 capsule 5   hydrocortisone 2.5 % cream      hydroquinone 4 % cream 1 Application.     ketoconazole  (NIZORAL ) 2 % cream      Lancet Devices (ONETOUCH DELICA PLUS LANCING) MISC 1 Device by Does not apply route daily. 1 each 0   losartan  (COZAAR ) 100 MG tablet Take 1 tablet (100 mg total) by mouth daily. 90 tablet 3   methocarbamol (ROBAXIN) 500 MG tablet TAKE 1 TABLET BY MOUTH THREE TIMES A DAY FOR 10 DAYS     nystatin  cream (MYCOSTATIN ) Apply topically.     nystatin -triamcinolone  (MYCOLOG II) cream SMARTSIG:Topical Morning-Evening     ONETOUCH VERIO test strip  CHECK BLOOD SUGAR 1 TIME DAILY 50 strip 7   oxyCODONE  (OXY IR/ROXICODONE ) 5 MG immediate release tablet TAKE 1-2 TABLETS  BY MOUTH EVERY 4 HOURS AS NEEDED FOR SEVERE PAIN     predniSONE  (DELTASONE ) 10 MG tablet 3 tabs by mouth per day for 3 days,2tabs per day for 3 days,1tab per day for 3 days (Patient not taking: Reported on 04/09/2024) 18 tablet 0   Probiotic Product (PROMELLA IN PREBIOTIC) CAPS Take 1 capsule by mouth daily.     promethazine  (PHENERGAN ) 25 MG tablet Take 25 mg by mouth daily as needed for nausea or vomiting.     rosuvastatin  (CRESTOR ) 10 MG tablet TAKE 1 TABLET BY MOUTH EVERY DAY 30 tablet 11   tirzepatide  (MOUNJARO ) 12.5 MG/0.5ML Pen Inject 12.5 mg into the skin once a week. 6 mL 3   traZODone  (DESYREL ) 50 MG tablet Take 50 mg by mouth.     triamcinolone  (NASACORT ) 55 MCG/ACT AERO nasal inhaler Place 2 sprays into the nose daily. 1 each 12   No current facility-administered medications on file prior to visit.        ROS:  All others reviewed and negative.  Objective        PE:  BP 122/78 (BP Location: Left Arm, Patient Position: Sitting, Cuff Size: Normal)   Pulse 75   Temp 97.6 F (36.4 C) (Oral)   Ht 5' 5 (1.651 m)   Wt 168 lb (76.2 kg)   SpO2 97%   BMI 27.96 kg/m                 Constitutional: Pt appears in NAD               HENT: Head: NCAT.                Right Ear: External ear normal.                 Left Ear: External ear normal.                Eyes: . Pupils are equal, round, and reactive to light. Conjunctivae and EOM are normal               Nose: without d/c or deformity               Neck: Neck supple. Gross normal ROM               Cardiovascular: Normal rate and regular rhythm.                 Pulmonary/Chest: Effort normal and breath sounds without rales or wheezing.                Abd:  Soft, NT, ND, + BS, no organomegaly               Neurological: Pt is alert. At baseline orientation, motor grossly intact               Skin: Skin is warm.  No rashes, no other new lesions, LE edema - none               Psychiatric: Pt behavior is normal without agitation   Micro: none  Cardiac tracings I have personally interpreted today:  none  Pertinent Radiological findings (summarize): none   Lab Results  Component Value Date   WBC 5.8 05/31/2024   HGB 14.7 05/31/2024   HCT 43.2 05/31/2024   PLT 195.0 05/31/2024   GLUCOSE 73 05/31/2024   CHOL 146 05/31/2024   TRIG 112.0 05/31/2024   HDL 51.70 05/31/2024   LDLDIRECT 124.0  05/26/2020   LDLCALC 72 05/31/2024   ALT 11 05/31/2024   AST 17 05/31/2024   NA 141 05/31/2024   K 3.7 05/31/2024   CL 106 05/31/2024   CREATININE 0.83 05/31/2024   BUN 12 05/31/2024   CO2 27 05/31/2024   TSH 0.84 11/24/2023   HGBA1C 4.8 05/31/2024   MICROALBUR 0.8 09/03/2023   Assessment/Plan:  [name] is a 63 y.o. White or Caucasian [1] female with  has a past medical history of Anemia, ANEMIA-IRON DEFICIENCY (01/03/2007), Anxiety, ANXIETY (01/03/2007), Back pain, Complication of anesthesia, Depression, DEPRESSION (01/03/2007), Diabetes (HCC), DJD (degenerative joint disease), Edema of both lower extremities, Enlarged thyroid  (10/18/2019), Family history of adverse reaction to anesthesia, Fatty liver, Foot pain, GERD (01/16/2009), GERD (gastroesophageal reflux disease), Headache(784.0) (01/03/2007), Hyperplastic colon polyp, HYPERSOMNIA (04/04/2010), Hypertension, IBS (01/16/2009), IBS (irritable bowel syndrome), Impaired glucose tolerance (05/18/2011), Knee pain, Menopause, Migraine, Obesity, OSTEOARTHRITIS, KNEES, 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  Vitamin D  deficiency Last vitamin D  Lab Results  Component Value Date   VD25OH 54.66 05/31/2024   Stable, cont oral replacement   Type 2 diabetes mellitus (HCC) Lab Results  Component Value Date   HGBA1C 4.8 05/31/2024   Stable, pt to continue current medical  treatment mounjaro  12. 5 mg weekly   Hypertension, uncontrolled BP Readings from Last 3 Encounters:  05/31/24 122/78  04/09/24 119/78  03/04/24 120/72   Stable, pt to continue medical treatment losartan  100 mg qd   Dyslipidemia Lab Results  Component Value Date   LDLCALC 72 05/31/2024   Stable, pt to continue current statin crestor  10 mg qd   B12 deficiency Lab Results  Component Value Date   VITAMINB12 519 11/24/2023   Stable, cont oral replacement - b12 1000 mcg qd  Followup: Return in about 6 months (around 11/29/2024).  Lynwood Rush, MD 05/31/2024 8:45 PM [facility]

## 2024-05-31 NOTE — Assessment & Plan Note (Signed)
 Lab Results  Component Value Date   VITAMINB12 519 11/24/2023   Stable, cont oral replacement - b12 1000 mcg qd

## 2024-05-31 NOTE — Assessment & Plan Note (Signed)
 Lab Results  Component Value Date   HGBA1C 4.8 05/31/2024   Stable, pt to continue current medical treatment mounjaro  12. 5 mg weekly

## 2024-05-31 NOTE — Assessment & Plan Note (Signed)
 Lab Results  Component Value Date   LDLCALC 72 05/31/2024   Stable, pt to continue current statin crestor  10 mg qd

## 2024-05-31 NOTE — Progress Notes (Signed)
 The test results show that your current treatment is OK, as the tests are stable.  Please continue the same plan.  There is no other need for change of treatment or further evaluation based on these results, at this time.  thanks

## 2024-05-31 NOTE — Patient Instructions (Addendum)

## 2024-07-03 ENCOUNTER — Other Ambulatory Visit: Payer: Self-pay | Admitting: Internal Medicine

## 2024-07-05 ENCOUNTER — Other Ambulatory Visit: Payer: Self-pay

## 2024-07-13 ENCOUNTER — Encounter: Admitting: Student in an Organized Health Care Education/Training Program

## 2024-07-17 ENCOUNTER — Other Ambulatory Visit (HOSPITAL_BASED_OUTPATIENT_CLINIC_OR_DEPARTMENT_OTHER): Payer: Self-pay | Admitting: Cardiovascular Disease

## 2024-07-22 NOTE — Telephone Encounter (Signed)
" °*  STAT* If patient is at the pharmacy, call can be transferred to refill team.   1. Which medications need to be refilled? (please list name of each medication and dose if known) losartan  (COZAAR ) 100 MG tablet    2. Would you like to learn more about the convenience, safety, & potential cost savings by using the [facility]? No    3. Are you open to using the [facility] (Type [facility].  ). No    4. Which pharmacy/location (including street and city if local pharmacy) is medication to be sent to? [id number] - [place], [place] - [address]     5. Do they need a 30 day or 90 day supply? 90  Pt has appt 5/18  "

## 2024-09-01 ENCOUNTER — Ambulatory Visit: Admitting: Internal Medicine

## 2024-11-08 ENCOUNTER — Ambulatory Visit (HOSPITAL_BASED_OUTPATIENT_CLINIC_OR_DEPARTMENT_OTHER): Admitting: Cardiovascular Disease

## 2024-11-24 ENCOUNTER — Encounter: Admitting: Student in an Organized Health Care Education/Training Program
